# Patient Record
Sex: Male | Born: 1970
Health system: Southern US, Community
[De-identification: ages and names within clinical notes are randomized; demographics above are authoritative.]

## PROBLEM LIST (undated history)

## (undated) DIAGNOSIS — D499 Neoplasm of unspecified behavior of unspecified site: Secondary | ICD-10-CM

## (undated) DIAGNOSIS — S22069A Unspecified fracture of T7-T8 vertebra, initial encounter for closed fracture: Secondary | ICD-10-CM

## (undated) DIAGNOSIS — R000 Tachycardia, unspecified: Secondary | ICD-10-CM

## (undated) DIAGNOSIS — F419 Anxiety disorder, unspecified: Secondary | ICD-10-CM

## (undated) DIAGNOSIS — K219 Gastro-esophageal reflux disease without esophagitis: Secondary | ICD-10-CM

## (undated) DIAGNOSIS — I10 Essential (primary) hypertension: Secondary | ICD-10-CM

## (undated) HISTORY — PX: KNEE SURGERY: SHX244

## (undated) HISTORY — DX: Unspecified fracture of T7-t8 vertebra, initial encounter for closed fracture: S22.069A

## (undated) HISTORY — DX: Anxiety disorder, unspecified: F41.9

## (undated) HISTORY — DX: Neoplasm of unspecified behavior of unspecified site: D49.9

---

## 1999-11-21 ENCOUNTER — Emergency Department (HOSPITAL_COMMUNITY): Admission: EM | Admit: 1999-11-21 | Discharge: 1999-11-21 | Payer: Self-pay | Admitting: Emergency Medicine

## 2000-05-05 ENCOUNTER — Encounter: Payer: Self-pay | Admitting: Family Medicine

## 2000-05-05 ENCOUNTER — Ambulatory Visit (HOSPITAL_COMMUNITY): Admission: RE | Admit: 2000-05-05 | Discharge: 2000-05-05 | Payer: Self-pay | Admitting: Family Medicine

## 2001-06-29 DIAGNOSIS — K449 Diaphragmatic hernia without obstruction or gangrene: Secondary | ICD-10-CM

## 2001-06-29 HISTORY — DX: Diaphragmatic hernia without obstruction or gangrene: K44.9

## 2002-11-30 ENCOUNTER — Ambulatory Visit (HOSPITAL_COMMUNITY): Admission: RE | Admit: 2002-11-30 | Discharge: 2002-11-30 | Payer: Self-pay | Admitting: Gastroenterology

## 2002-11-30 ENCOUNTER — Encounter: Payer: Self-pay | Admitting: Gastroenterology

## 2002-12-26 ENCOUNTER — Ambulatory Visit (HOSPITAL_COMMUNITY): Admission: RE | Admit: 2002-12-26 | Discharge: 2002-12-26 | Payer: Self-pay | Admitting: Gastroenterology

## 2004-08-26 ENCOUNTER — Emergency Department (HOSPITAL_COMMUNITY): Admission: EM | Admit: 2004-08-26 | Discharge: 2004-08-26 | Payer: Self-pay | Admitting: Family Medicine

## 2004-08-28 ENCOUNTER — Ambulatory Visit: Payer: Self-pay | Admitting: Hematology & Oncology

## 2011-09-06 ENCOUNTER — Other Ambulatory Visit: Payer: Self-pay | Admitting: Family Medicine

## 2011-09-11 LAB — CULTURE, ROUTINE-ABSCESS
Gram Stain: NONE SEEN
Gram Stain: NONE SEEN
Gram Stain: NONE SEEN

## 2012-10-10 ENCOUNTER — Emergency Department (HOSPITAL_COMMUNITY)
Admission: EM | Admit: 2012-10-10 | Discharge: 2012-10-10 | Disposition: A | Discharge status: Home / Self Care | Payer: Self-pay | Attending: Emergency Medicine | Admitting: Emergency Medicine

## 2012-10-10 ENCOUNTER — Encounter (HOSPITAL_COMMUNITY): Payer: Self-pay | Admitting: *Deleted

## 2012-10-10 DIAGNOSIS — Z79899 Other long term (current) drug therapy: Secondary | ICD-10-CM | POA: Insufficient documentation

## 2012-10-10 DIAGNOSIS — T50905A Adverse effect of unspecified drugs, medicaments and biological substances, initial encounter: Secondary | ICD-10-CM

## 2012-10-10 DIAGNOSIS — R209 Unspecified disturbances of skin sensation: Secondary | ICD-10-CM | POA: Insufficient documentation

## 2012-10-10 DIAGNOSIS — R5381 Other malaise: Secondary | ICD-10-CM | POA: Insufficient documentation

## 2012-10-10 DIAGNOSIS — R42 Dizziness and giddiness: Secondary | ICD-10-CM | POA: Insufficient documentation

## 2012-10-10 DIAGNOSIS — R000 Tachycardia, unspecified: Secondary | ICD-10-CM | POA: Insufficient documentation

## 2012-10-10 DIAGNOSIS — F411 Generalized anxiety disorder: Secondary | ICD-10-CM | POA: Insufficient documentation

## 2012-10-10 DIAGNOSIS — E876 Hypokalemia: Secondary | ICD-10-CM | POA: Insufficient documentation

## 2012-10-10 DIAGNOSIS — R6883 Chills (without fever): Secondary | ICD-10-CM | POA: Insufficient documentation

## 2012-10-10 DIAGNOSIS — R0602 Shortness of breath: Secondary | ICD-10-CM | POA: Insufficient documentation

## 2012-10-10 DIAGNOSIS — T450X5A Adverse effect of antiallergic and antiemetic drugs, initial encounter: Secondary | ICD-10-CM | POA: Insufficient documentation

## 2012-10-10 HISTORY — DX: Tachycardia, unspecified: R00.0

## 2012-10-10 LAB — BASIC METABOLIC PANEL
BUN: 16 mg/dL (ref 6–23)
CO2: 26 mEq/L (ref 19–32)
Calcium: 10 mg/dL (ref 8.4–10.5)
Creatinine, Ser: 0.94 mg/dL (ref 0.50–1.35)
Glucose, Bld: 131 mg/dL — ABNORMAL HIGH (ref 70–99)

## 2012-10-10 LAB — CBC WITH DIFFERENTIAL/PLATELET
Basophils Absolute: 0 10*3/uL (ref 0.0–0.1)
Eosinophils Relative: 1 % (ref 0–5)
HCT: 40.6 % (ref 39.0–52.0)
Lymphocytes Relative: 12 % (ref 12–46)
MCV: 80.9 fL (ref 78.0–100.0)
Monocytes Absolute: 1 10*3/uL (ref 0.1–1.0)
Monocytes Relative: 6 % (ref 3–12)
RDW: 12.9 % (ref 11.5–15.5)
WBC: 16.4 10*3/uL — ABNORMAL HIGH (ref 4.0–10.5)

## 2012-10-10 LAB — MAGNESIUM: Magnesium: 1.9 mg/dL (ref 1.5–2.5)

## 2012-10-10 MED ORDER — POTASSIUM CHLORIDE CRYS ER 20 MEQ PO TBCR
40.0000 meq | EXTENDED_RELEASE_TABLET | Freq: Once | ORAL | Status: AC
  Administered 2012-10-10: 40 meq via ORAL
  Filled 2012-10-10: qty 2

## 2012-10-10 MED ORDER — POTASSIUM CHLORIDE ER 10 MEQ PO TBCR: 20.0000 meq | EXTENDED_RELEASE_TABLET | Freq: Every day | ORAL | Status: DC

## 2012-10-10 MED ORDER — POTASSIUM CHLORIDE 10 MEQ/100ML IV SOLN
10.0000 meq | INTRAVENOUS | Status: AC
  Administered 2012-10-10 (×2): 10 meq via INTRAVENOUS
  Filled 2012-10-10 (×2): qty 100

## 2012-10-10 NOTE — ED Notes (Signed)
Pt states that he has not taken his BP medication in a while

## 2012-10-10 NOTE — ED Provider Notes (Addendum)
History     CSN: [id number]  Arrival date & time 10/10/12  0405   First MD Initiated Contact with Patient 10/10/12 [phone]      Chief Complaint  Patient presents with  . Tachycardia  . Shortness of Breath  . Dizziness   HPI Donald Jenkins is a 42 y.o. male complaints of tachycardia. Patient says he has had a racing heart that started earlier this evening and has continued, he's had this before and was worked up for in September of 2007 hospital in [place]. He was discharged with a diagnosis of anxiety. He says he has no formal diagnosis of anxiety-he's not been treated for it, but he says "I worry a lot," and that he thinks he may have "social anxiety disorder." He says being with a few other people make him very nervous. Patient has seen her primary care provider [facility] but does not remember the physician's name.  He says this evening he developed rapid heartbeat, no palpitations, no chest pain, says sometimes he feels short of breath, denies sacral or perioral paresthesias.  He denies any history of venous thromboembolic disease, no hemoptysis, no history of cancer, no recent immobilization no swelling or pain in his legs. No recent long travel.  Patient is also concerned about a 3 cm x 5 cm patch of skin on the distal, lateral thigh and has some paresthesias, this is been present for over a year. He says he feels like it's gotten bigger. Is also concerned about 2 lumps that he feels under his skin in his thigh. He is concerned he "has a tumor pressing on an artery."  Patient takes an extensive amount of supplements, he's also been taking Bromaline which contains a brompheniramine and pseudoephedrine as well as other supplements such as grape seed extract, ginkgo biloba, capsicum, serrepeptase.    Past Medical History  Diagnosis Date  . Tachycardia     History reviewed. No pertinent past surgical history.  No family history on file.  History  Substance  Use Topics  . Smoking status: Never Smoker   . Smokeless tobacco: Not on file  . Alcohol Use: No      Review of Systems  Constitutional: Positive for chills and fatigue. Negative for fever and diaphoresis.  HENT: Negative for ear pain, congestion, sore throat, rhinorrhea and sneezing.   Eyes: Negative for photophobia, pain, redness and visual disturbance.  Respiratory: Positive for shortness of breath. Negative for cough, choking and wheezing.   Cardiovascular: Negative for chest pain, palpitations and leg swelling.  Gastrointestinal: Negative for nausea, vomiting, diarrhea, blood in stool and abdominal distention.  Endocrine: Negative for cold intolerance and heat intolerance.  Genitourinary: Negative for dysuria and flank pain.  Musculoskeletal: Negative for myalgias, joint swelling and arthralgias.  Skin: Negative for rash.  Allergic/Immunologic: Positive for environmental allergies.       Pollen  Neurological: Positive for light-headedness. Negative for dizziness, tremors, syncope, weakness and headaches.  Hematological: Negative for adenopathy. Does not bruise/bleed easily.  Psychiatric/Behavioral: Negative for sleep disturbance and self-injury. The patient is nervous/anxious. The patient is not hyperactive.     Allergies  Penicillins  Home Medications   Current Outpatient Rx  Name  Route  Sig  Dispense  Refill  . Brompheniramine-Pseudoeph (BROMALINE PO)   Oral   Take 1 tablet by mouth daily.         . Capsicum, Cayenne, (CAYENNE PO)   Oral   Take 1 tablet by mouth daily.         Marland Kitchen  Coenzyme Q10 (COQ-10 PO)   Oral   Take 1 tablet by mouth daily.         Marland Kitchen GINKGO BILOBA PO   Oral   Take 1 tablet by mouth daily.         Marland Kitchen GRAPE SEED EXTRACT PO   Oral   Take 1 tablet by mouth daily.         Marland Kitchen HYDROcodone-acetaminophen (NORCO/VICODIN) 5-325 MG per tablet   Oral   Take 1-1.5 tablets by mouth every 6 (six) hours as needed for pain.         .  L-ARGININE PO   Oral   Take 1 tablet by mouth daily.         . L-CYSTEINE PO   Oral   Take 1 tablet by mouth daily.         . magnesium oxide (MAG-OX) 400 MG tablet   Oral   Take 800 mg by mouth daily.         Marland Kitchen NIACIN PO   Oral   Take 1 tablet by mouth daily.         Marland Kitchen omega-3 acid ethyl esters (LOVAZA) 1 G capsule   Oral   Take 2 g by mouth daily.         Marland Kitchen omeprazole-sodium bicarbonate (ZEGERID) 40-1100 MG per capsule   Oral   Take 1 capsule by mouth daily as needed (for acid reflux).         Marland Kitchen OVER THE COUNTER MEDICATION   Oral   Take 1 tablet by mouth daily. Tumeric         . Triamcinolone Acetonide (NASACORT ALLERGY 24HR NA)   Nasal   Place 1 spray into the nose 2 (two) times daily as needed (for allergies).         . Vitamin D-Vitamin K (VITAMIN K2-VITAMIN D3 PO)   Oral   Take 5,000 mg by mouth daily.           BP 178/103  Pulse 129  Temp(Src) 98.9 F (37.2 C) (Oral)  Resp 22  SpO2 100%  Physical Exam  Nursing notes reviewed.  Electronic medical record reviewed. VITAL SIGNS:   Filed Vitals:   10/10/12 0420 10/10/12 0746  BP: 178/103 145/75  Pulse: 129 90  Temp: 98.9 F (37.2 C)   TempSrc: Oral   Resp: 22 18  SpO2: 100% 100%   CONSTITUTIONAL: Awake, oriented, appears non-toxic HENT: Atraumatic, normocephalic, oral mucosa pink and moist, airway patent. Nares patent without drainage. External ears normal. EYES: Conjunctiva clear, EOMI, PERRLA NECK: Trachea midline, non-tender, supple CARDIOVASCULAR: Normal heart rate, Normal rhythm, No murmurs, rubs, gallops PULMONARY/CHEST: Clear to auscultation, no rhonchi, wheezes, or rales. Symmetrical breath sounds. Non-tender. ABDOMINAL: Non-distended, soft, non-tender - no rebound or guarding.  BS normal. NEUROLOGIC: Non-focal, moving all four extremities, small area of paresthesia to the distal lateral thigh, no other gross sensory or motor deficits. EXTREMITIES: No clubbing, cyanosis,  or edema. Small pea-sized nodular subcutaneous masses felt in the distal thigh. Small area of paresthesias of the distal lateral thigh. SKIN: Warm, Dry, No erythema, No rash   ED Course  Procedures (including critical care time)  Date: 10/10/2012  Rate: 124  Rhythm:  Sinus tachycardia  QRS Axis: normal  Intervals: normal  ST/T Wave abnormalities: normal  Conduction Disutrbances: none  Narrative Interpretation: Sinus tachycardia no prior for comparison     Labs Reviewed  CBC WITH DIFFERENTIAL - Abnormal; Notable for the following:  WBC 16.4 (*)    MCHC 36.7 (*)    Neutrophils Relative 81 (*)    Neutro Abs 13.3 (*)    All other components within normal limits  BASIC METABOLIC PANEL - Abnormal; Notable for the following:    Potassium 2.6 (*)    Glucose, Bld 131 (*)    All other components within normal limits  MAGNESIUM  D-DIMER, QUANTITATIVE   No results found.   1. Tachycardia   2. Adverse reaction to over-the-counter medication, initial encounter   3. Hypokalemia     MDM  PT on multiple suplemetns presents with tachycardia, suspect pseudoephedrine.  EKG shows sinus tachycardia.  D-dimer is negative, pretest probability is extremely low for venous thrombolic disease, I do not think he has a PE or blood clot in his legs.  Bedside ultrasound of nodules in the patient's thigh appear to be small lipomas - he can have these removed as needed.  Laboratory analysis does reveal hypokalemia with a potassium of 2.6. This could be accounting for the patient's fatigue. I think probably one of the supplements is likely causing this, I told him to not take any of the supplements, will replete potassium by mouth and IV.  Patient's renal function is normal, we'll send him home with some potassium supplements and followup with primary care.  I explained the diagnosis and have given explicit precautions to return to the ER including any other new or worsening symptoms. The patient  understands and accepts the medical plan as it's been dictated and I have answered their questions. Discharge instructions concerning home care and prescriptions have been given.  The patient is STABLE and is discharged to home in good condition.      Jones Skene, MD 10/10/12 1610  Jones Skene, MD 10/10/12 1025

## 2012-10-10 NOTE — ED Notes (Signed)
Pt denies CP. C/o fast HR, lightheadedness. Denies N/V. C/o numbness in right leg.

## 2012-10-10 NOTE — ED Notes (Addendum)
C/o fast HR, also some sob and dizziness. Skin cool pale dry. Alert, NAD, calm, interactive, ambulatory, speaking in clear complete sentences. Denies CP. H/o similar, "thought it might be r/t anxiety". Takes no meds, "takes lots of supplements & vitamins".

## 2013-07-02 ENCOUNTER — Emergency Department (HOSPITAL_COMMUNITY)
Admission: EM | Admit: 2013-07-02 | Discharge: 2013-07-02 | Disposition: A | Discharge status: Home / Self Care | Payer: No Typology Code available for payment source | Attending: Emergency Medicine | Admitting: Emergency Medicine

## 2013-07-02 ENCOUNTER — Encounter (HOSPITAL_COMMUNITY): Payer: Self-pay | Admitting: Emergency Medicine

## 2013-07-02 ENCOUNTER — Emergency Department (HOSPITAL_COMMUNITY): Payer: No Typology Code available for payment source

## 2013-07-02 ENCOUNTER — Emergency Department (INDEPENDENT_AMBULATORY_CARE_PROVIDER_SITE_OTHER)
Admission: EM | Admit: 2013-07-02 | Discharge: 2013-07-02 | Disposition: A | Discharge status: Hospice | Payer: No Typology Code available for payment source | Source: Home / Self Care | Attending: Family Medicine | Admitting: Family Medicine

## 2013-07-02 DIAGNOSIS — E876 Hypokalemia: Secondary | ICD-10-CM | POA: Insufficient documentation

## 2013-07-02 DIAGNOSIS — F419 Anxiety disorder, unspecified: Secondary | ICD-10-CM

## 2013-07-02 DIAGNOSIS — F411 Generalized anxiety disorder: Secondary | ICD-10-CM | POA: Insufficient documentation

## 2013-07-02 DIAGNOSIS — D72829 Elevated white blood cell count, unspecified: Secondary | ICD-10-CM | POA: Insufficient documentation

## 2013-07-02 DIAGNOSIS — I10 Essential (primary) hypertension: Secondary | ICD-10-CM | POA: Insufficient documentation

## 2013-07-02 DIAGNOSIS — R000 Tachycardia, unspecified: Secondary | ICD-10-CM | POA: Insufficient documentation

## 2013-07-02 DIAGNOSIS — R51 Headache: Secondary | ICD-10-CM | POA: Insufficient documentation

## 2013-07-02 DIAGNOSIS — I498 Other specified cardiac arrhythmias: Secondary | ICD-10-CM

## 2013-07-02 DIAGNOSIS — Z880 Allergy status to penicillin: Secondary | ICD-10-CM | POA: Insufficient documentation

## 2013-07-02 DIAGNOSIS — Z79899 Other long term (current) drug therapy: Secondary | ICD-10-CM | POA: Insufficient documentation

## 2013-07-02 LAB — POCT I-STAT, CHEM 8
BUN: 13 mg/dL (ref 6–23)
Calcium, Ion: 1.2 mmol/L (ref 1.12–1.23)
Chloride: 99 mEq/L (ref 96–112)
Creatinine, Ser: 1 mg/dL (ref 0.50–1.35)
Glucose, Bld: 146 mg/dL — ABNORMAL HIGH (ref 70–99)
HEMATOCRIT: 49 % (ref 39.0–52.0)
HEMOGLOBIN: 16.7 g/dL (ref 13.0–17.0)
POTASSIUM: 2.6 meq/L — AB (ref 3.7–5.3)
SODIUM: 141 meq/L (ref 137–147)
TCO2: 24 mmol/L (ref 0–100)

## 2013-07-02 LAB — CBC
HCT: 42.3 % (ref 39.0–52.0)
Hemoglobin: 15.5 g/dL (ref 13.0–17.0)
MCH: 29.9 pg (ref 26.0–34.0)
MCHC: 36.6 g/dL — ABNORMAL HIGH (ref 30.0–36.0)
MCV: 81.5 fL (ref 78.0–100.0)
PLATELETS: 241 10*3/uL (ref 150–400)
RBC: 5.19 MIL/uL (ref 4.22–5.81)
RDW: 12.3 % (ref 11.5–15.5)
WBC: 13.2 10*3/uL — AB (ref 4.0–10.5)

## 2013-07-02 LAB — BASIC METABOLIC PANEL
BUN: 14 mg/dL (ref 6–23)
CHLORIDE: 98 meq/L (ref 96–112)
CO2: 27 mEq/L (ref 19–32)
CREATININE: 0.87 mg/dL (ref 0.50–1.35)
Calcium: 8.6 mg/dL (ref 8.4–10.5)
GFR calc non Af Amer: >90 mL/min (ref >90)
Glucose, Bld: 109 mg/dL — ABNORMAL HIGH (ref 70–99)
POTASSIUM: 3.1 meq/L — AB (ref 3.7–5.3)
SODIUM: 138 meq/L (ref 137–147)

## 2013-07-02 LAB — POCT I-STAT TROPONIN I: TROPONIN I, POC: 0 ng/mL (ref 0.00–0.08)

## 2013-07-02 MED ORDER — HYDROCHLOROTHIAZIDE 25 MG PO TABS: 25.0000 mg | ORAL_TABLET | Freq: Every day | ORAL | Status: DC

## 2013-07-02 MED ORDER — POTASSIUM CHLORIDE ER 10 MEQ PO TBCR: 10.0000 meq | EXTENDED_RELEASE_TABLET | Freq: Every day | ORAL | Status: DC

## 2013-07-02 MED ORDER — POTASSIUM CHLORIDE 10 MEQ/100ML IV SOLN
10.0000 meq | INTRAVENOUS | Status: DC
  Administered 2013-07-02 (×3): 10 meq via INTRAVENOUS
  Filled 2013-07-02 (×3): qty 100

## 2013-07-02 MED ORDER — POTASSIUM CHLORIDE 20 MEQ/15ML (10%) PO LIQD
40.0000 meq | Freq: Once | ORAL | Status: AC
  Administered 2013-07-02: 40 meq via ORAL
  Filled 2013-07-02: qty 30

## 2013-07-02 MED ORDER — SODIUM CHLORIDE 0.9 % IV BOLUS (SEPSIS)
1000.0000 mL | Freq: Once | INTRAVENOUS | Status: AC
  Administered 2013-07-02: 1000 mL via INTRAVENOUS

## 2013-07-02 MED ORDER — SODIUM CHLORIDE 0.9 % IV SOLN
Freq: Once | INTRAVENOUS | Status: AC
  Administered 2013-07-02: 18:00:00 via INTRAVENOUS

## 2013-07-02 MED ORDER — LORAZEPAM 2 MG/ML IJ SOLN
1.0000 mg | Freq: Once | INTRAMUSCULAR | Status: AC
  Administered 2013-07-02: 1 mg via INTRAVENOUS
  Filled 2013-07-02: qty 1

## 2013-07-02 NOTE — ED Notes (Signed)
Offered comfort measures.

## 2013-07-02 NOTE — ED Notes (Signed)
Patient returned from xray and placed on cardiac monitor.   

## 2013-07-02 NOTE — ED Provider Notes (Signed)
TIME SEEN: 7:40 PM  CHIEF COMPLAINT: Palpitations  HPI: Patient is a 43 y.o. male with history of anxiety and prior palpitations in the setting of hypokalemia who presents the emergency department as a transfer from urgent care for palpitations that started at 4 PM today while at rest and watching football game. He reports that he began feeling palpitations which may have a very anxious which made his symptoms worse. He states that he did have a mild headache this morning and took headache medication that was Tylenol with caffeine. He reports that he has had similar episodes of palpitations in April of 2014 in and 2007. Both times he was hypokalemic. He denies that he's had any vomiting or diarrhea. No bloody stools or melena. He denies any chest pain or discomfort. He did have mild shortness of breath but this is resolved. Patient does take multiple supplements but is no longer taking Bromaline which did contain pseudoephedrine. He states that he normally does not drink any caffeine at all because of his history of palpitations. He denies any illicit drug use. No history of prolonged immobilization, fracture, surgery, trauma, hospitalization, prior PE or DVT, lower extremity swelling or pain, tobacco use. At urgent care today, patient was noted to be in sinus tachycardia and was hypertensive and had a potassium of 2.6.  ROS: See HPI Constitutional: no fever  Eyes: no drainage  ENT: no runny nose   Cardiovascular:  no chest pain  Resp: Resolved SOB  GI: no vomiting GU: no dysuria Integumentary: no rash  Allergy: no hives  Musculoskeletal: no leg swelling  Neurological: no slurred speech ROS otherwise negative  PAST MEDICAL HISTORY/PAST SURGICAL HISTORY:  Past Medical History  Diagnosis Date  . Tachycardia     MEDICATIONS:  Prior to Admission medications   Medication Sig Start Date End Date Taking? Authorizing Provider  Capsicum, Cayenne, (CAYENNE PO) Take 1 tablet by mouth daily.   Yes  Historical Provider, MD  Coenzyme Q10 (COQ-10 PO) Take 1 tablet by mouth daily.   Yes Historical Provider, MD  GINKGO BILOBA PO Take 1 tablet by mouth daily.   Yes Historical Provider, MD  GRAPE SEED EXTRACT PO Take 1 tablet by mouth daily.   Yes Historical Provider, MD  L-ARGININE PO Take 1 tablet by mouth daily.   Yes Historical Provider, MD  L-CYSTEINE PO Take 1 tablet by mouth daily.   Yes Historical Provider, MD  magnesium oxide (MAG-OX) 400 MG tablet Take 800 mg by mouth daily.   Yes Historical Provider, MD  NIACIN PO Take 1 tablet by mouth daily.   Yes Historical Provider, MD  omega-3 acid ethyl esters (LOVAZA) 1 G capsule Take 2 g by mouth daily.   Yes Historical Provider, MD  omeprazole-sodium bicarbonate (ZEGERID) 40-1100 MG per capsule Take 1 capsule by mouth daily as needed (for acid reflux).   Yes Historical Provider, MD  OVER THE COUNTER MEDICATION Take 1 tablet by mouth daily. Tumeric   Yes Historical Provider, MD  Triamcinolone Acetonide (NASACORT ALLERGY 24HR NA) Place 1 spray into the nose 2 (two) times daily as needed (for allergies).   Yes Historical Provider, MD  Vitamin D-Vitamin K (VITAMIN K2-VITAMIN D3 PO) Take 5,000 mg by mouth daily.   Yes Historical Provider, MD    ALLERGIES:  Allergies  Allergen Reactions  . Penicillins Other (See Comments)    From childhood    SOCIAL HISTORY:  History  Substance Use Topics  . Smoking status: Never Smoker   .  Smokeless tobacco: Not on file  . Alcohol Use: No    FAMILY HISTORY: No family history on file.  EXAM: BP 194/121  Pulse 112  Temp(Src) 98.3 F (36.8 C) (Oral)  Resp 13  SpO2 99% CONSTITUTIONAL: Alert and oriented and responds appropriately to questions. Well-appearing; well-nourished, appears anxious HEAD: Normocephalic EYES: Conjunctivae clear, PERRL ENT: normal nose; no rhinorrhea; moist mucous membranes; pharynx without lesions noted NECK: Supple, no meningismus, no LAD  CARD: Tachycardic; S1 and S2  appreciated; no murmurs, no clicks, no rubs, no gallops RESP: Normal chest excursion without splinting or tachypnea; breath sounds clear and equal bilaterally; no wheezes, no rhonchi, no rales,  ABD/GI: Normal bowel sounds; non-distended; soft, non-tender, no rebound, no guarding BACK:  The back appears normal and is non-tender to palpation, there is no CVA tenderness EXT: Normal ROM in all joints; non-tender to palpation; no edema; normal capillary refill; no cyanosis    SKIN: Normal color for age and race; warm NEURO: Moves all extremities equally PSYCH: The patient's mood and manner are appropriate. Grooming and personal hygiene are appropriate.  MEDICAL DECISION MAKING: Patient here with hypertension and tachycardia. He is currently feeling anxious but otherwise asymptomatic. He has no risk factors for PE. No risk factors for ACS. Denies any current chest pain or shortness of breath. EKG shows sinus tachycardia with no ischemic changes. Patient was hypokalemic at urgent care today. We'll repeat labs and begin replacing potassium. We'll give IV fluids and IV Ativan and reassess.  ED PROGRESS: Patient has a mild leukocytosis which I feel is reactive. His repeat potassium is 3.1. Chest x-ray shows no infiltrate, edema. Troponin is negative. His heart rate is now in the 90s after IV fluids and Ativan. He is still mildly hypertensive but asymptomatic. Have urged the patient to establish primary care followup and we'll start him on hydrochlorothiazide 25 mg daily for his elevated blood pressure. Have discussed with patient that I do not feel this needs to be acutely lower today given he is likely been hypertensive for several years since stopping his prior antihypertensives. Have given strict return precautions. Will send home with several days of potassium for replacement. Patient verbalizes understanding and is comfortable with plan.   EKG Interpretation    Date/Time:  Sunday July 02 2013 19:08:50  EST Ventricular Rate:  109 PR Interval:  183 QRS Duration: 87 QT Interval:  356 QTC Calculation: 479 R Axis:   54 Text Interpretation:  Sinus tachycardia No significant change since last tracing Confirmed by Balinda Heacock  DO, Aboubacar Matsuo ([id number]) on 07/02/2013 7:14:11 PM               [place], DO 07/02/13 2255

## 2013-07-02 NOTE — ED Notes (Signed)
Patient with some anxiety of coming to ED from [facility].  Patient states that some of his tachycardia can be from his anxiety.

## 2013-07-02 NOTE — Discharge Instructions (Signed)
Nonspecific Tachycardia Tachycardia is a faster than normal heartbeat (more than 100 beats per minute). In adults, the heart normally beats between 60 and 100 times a minute. A fast heartbeat may be a normal response to exercise or stress. It does not necessarily mean that something is wrong. However, sometimes when your heart beats too fast it may not be able to pump enough blood to the rest of your body. This can result in chest pain, shortness of breath, dizziness, and even fainting. Nonspecific tachycardia means that the specific cause or pattern of your tachycardia is unknown. CAUSES  Tachycardia may be harmless or it may be due to a more serious underlying cause. Possible causes of tachycardia include:  Exercise or exertion.  Fever.  Pain or injury.  Infection.  Loss of body fluids (dehydration).  Overactive thyroid.  Lack of red blood cells (anemia).  Anxiety and stress.  Alcohol.  Caffeine.  Tobacco products.  Diet pills.  Illegal drugs.  Heart disease. SYMPTOMS  Rapid or irregular heartbeat (palpitations).  Suddenly feeling your heart beating (cardiac awareness).  Dizziness.  Tiredness (fatigue).  Shortness of breath.  Chest pain.  Nausea.  Fainting. DIAGNOSIS  Your caregiver will perform a physical exam and take your medical history. In some cases, a heart specialist (cardiologist) may be consulted. Your caregiver may also order:  Blood tests.  Electrocardiography. This test records the electrical activity of your heart.  A heart monitoring test. TREATMENT  Treatment will depend on the likely cause of your tachycardia. The goal is to treat the underlying cause of your tachycardia. Treatment methods may include:  Replacement of fluids or blood through an intravenous (IV) tube for moderate to severe dehydration or anemia.  New medicines or changes in your current medicines.  Diet and lifestyle changes.  Treatment for certain  infections.  Stress relief or relaxation methods. HOME CARE INSTRUCTIONS   Rest.  Drink enough fluids to keep your urine clear or pale yellow.  Do not smoke.  Avoid:  Caffeine.  Tobacco.  Alcohol.  Chocolate.  Stimulants such as over-the-counter diet pills or pills that help you stay awake.  Situations that cause anxiety or stress.  Illegal drugs such as marijuana, phencyclidine (PCP), and cocaine.  Only take medicine as directed by your caregiver.  Keep all follow-up appointments as directed by your caregiver. SEEK IMMEDIATE MEDICAL CARE IF:   You have pain in your chest, upper arms, jaw, or neck.  You become weak, dizzy, or feel faint.  You have palpitations that will not go away.  You vomit, have diarrhea, or pass blood in your stool.  Your skin is cool, pale, and wet.  You have a fever that will not go away with rest, fluids, and medicine. MAKE SURE YOU:   Understand these instructions.  Will watch your condition.  Will get help right away if you are not doing well or get worse. Document Released: 07/23/2004 Document Revised: 09/07/2011 Document Reviewed: 05/26/2011 [facility] Patient Information 2014 [place], [place].  Hypertension As your heart beats, it forces blood through your arteries. This force is your blood pressure. If the pressure is too high, it is called hypertension (HTN) or high blood pressure. HTN is dangerous because you may have it and not know it. High blood pressure may mean that your heart has to work harder to pump blood. Your arteries may be narrow or stiff. The extra work puts you at risk for heart disease, stroke, and other problems.  Blood pressure consists of two  numbers, a higher number over a lower, 110/72, for example. It is stated as "110 over 72." The ideal is below 120 for the top number (systolic) and under 80 for the bottom (diastolic). Write down your blood pressure today. You should pay close attention to your blood  pressure if you have certain conditions such as:  Heart failure.  Prior heart attack.  Diabetes  Chronic kidney disease.  Prior stroke.  Multiple risk factors for heart disease. To see if you have HTN, your blood pressure should be measured while you are seated with your arm held at the level of the heart. It should be measured at least twice. A one-time elevated blood pressure reading (especially in the Emergency Department) does not mean that you need treatment. There may be conditions in which the blood pressure is different between your right and left arms. It is important to see your caregiver soon for a recheck. Most people have essential hypertension which means that there is not a specific cause. This type of high blood pressure may be lowered by changing lifestyle factors such as:  Stress.  Smoking.  Lack of exercise.  Excessive weight.  Drug/tobacco/alcohol use.  Eating less salt. Most people do not have symptoms from high blood pressure until it has caused damage to the body. Effective treatment can often prevent, delay or reduce that damage. TREATMENT  When a cause has been identified, treatment for high blood pressure is directed at the cause. There are a large number of medications to treat HTN. These fall into several categories, and your caregiver will help you select the medicines that are best for you. Medications may have side effects. You should review side effects with your caregiver. If your blood pressure stays high after you have made lifestyle changes or started on medicines,   Your medication(s) may need to be changed.  Other problems may need to be addressed.  Be certain you understand your prescriptions, and know how and when to take your medicine.  Be sure to follow up with your caregiver within the time frame advised (usually within two weeks) to have your blood pressure rechecked and to review your medications.  If you are taking more than one  medicine to lower your blood pressure, make sure you know how and at what times they should be taken. Taking two medicines at the same time can result in blood pressure that is too low. SEEK IMMEDIATE MEDICAL CARE IF:  You develop a severe headache, blurred or changing vision, or confusion.  You have unusual weakness or numbness, or a faint feeling.  You have severe chest or abdominal pain, vomiting, or breathing problems. MAKE SURE YOU:   Understand these instructions.  Will watch your condition.  Will get help right away if you are not doing well or get worse. Document Released: 06/15/2005 Document Revised: 09/07/2011 Document Reviewed: 02/03/2008 [facility] Patient Information 2014 [place].  Hypokalemia Hypokalemia means a low potassium level in the blood.Potassium is an electrolyte that helps regulate the amount of fluid in the body. It also stimulates muscle contraction and maintains a stable acid-base balance.Most of the body's potassium is inside of cells, and only a very small amount is in the blood. Because the amount in the blood is so small, minor changes can have big effects. PREPARATION FOR TEST Testing for potassium requires taking a blood sample taken by needle from a vein in the arm. The skin is cleaned thoroughly before the sample is drawn. There is no  other special preparation needed. NORMAL VALUES Potassium levels below 3.5 mEq/L are abnormally low. Levels above 5.1 mEq/L are abnormally high. Ranges for normal findings may vary among different laboratories and hospitals. You should always check with your doctor after having lab work or other tests done to discuss the meaning of your test results and whether your values are considered within normal limits. MEANING OF TEST  Your caregiver will go over the test results with you and discuss the importance and meaning of your results, as well as treatment options and the need for additional tests, if necessary. A  potassium level is frequently part of a routine medical exam. It is usually included as part of a whole "panel" of tests for several blood salts (such as Sodium and Chloride). It may be done as part of follow-up when a low potassium level was found in the past or other blood salts are suspected of being out of balance. A low potassium level might be suspected if you have one or more of the following:  Symptoms of weakness.  Abnormal heart rhythms.  High blood pressure and are taking medication to control this, especially water pills (diuretics).  Kidney disease that can affect your potassium level .  Diabetes requiring the use of insulin. The potassium may fall after taking insulin, especially if the diabetes had been out of control for a while.  A condition requiring the use of cortisone-type medication or certain types of antibiotics.  Vomiting and/or diarrhea for more than a day or two.  A stomach or intestinal condition that may not permit appropriate absorption of potassium.  Fainting episodes.  Mental confusion. OBTAINING TEST RESULTS It is your responsibility to obtain your test results. Ask the lab or department performing the test when and how you will get your results.  Please contact your caregiver directly if you have not received the results within one week. At that time, ask if there is anything different or new you should be doing in relation to the results. TREATMENT Hypokalemia can be treated with potassium supplements taken by mouth and/or adjustments in your current medications. A diet high in potassium is also helpful. Foods with high potassium content are:  Peas, lentils, lima beans, nuts, and dried fruit.  Whole grain and bran cereals and breads.  Fresh fruit, vegetables (bananas, cantaloupe, grapefruit, oranges, tomatoes, honeydew melons, potatoes).  Orange and tomato juices.  Meats. If potassium supplement has been prescribed for you today or your  medications have been adjusted, see your personal caregiver in time02 for a re-check. SEEK MEDICAL CARE IF:  There is a feeling of worsening weakness.  You experience repeated chest palpitations.  You are diabetic and having difficulty keeping your blood sugars in the normal range.  You are experiencing vomiting and/or diarrhea.  You are having difficulty with any of your regular medications. SEEK IMMEDIATE MEDICAL CARE IF:  You experience chest pain, shortness of breath, or episodes of dizziness.  You have been having vomiting or diarrhea for more than 2 days.  You have a fainting episode. MAKE SURE YOU:   Understand these instructions.  Will watch your condition.  Will get help right away if you are not doing well or get worse. Document Released: 06/15/2005 Document Revised: 09/07/2011 Document Reviewed: 12/16/2012 [facility] Patient Information 2014 [place].   RESOURCE GUIDE  Chronic Pain Problems: Contact [facility]  [phone] Patients need to be referred by their primary care doctor.  Insufficient Money for Medicine: Contact  [employer]:  call [phone]  No Primary Care Doctor: - [place] - can help you locate a primary care doctor that  accepts your insurance, provides certain services, etc. - Physician Referral Service- [phone]  Agencies that provide inexpensive medical care: - [facility]  [facility]  [phone] - [facility]  [phone] - [facility]  [phone] - [facility]  [facility]  [phone]  [place] Providers: - [facility]- [address]  [phone], Mon-Fri 9am-7pm, Sat 9am-1pm - [facility]- [place], [address] [place]  [place], [address] [place]  [place]- [address]  [place], [address], [phone]  Only accepts [place] [facility] [place] patients after they have their name  applied to their card  Self Pay (no insurance) in [place]: - Sickle Cell Patients - [facility]  [place], [facility]- [place] [facility] [place]- [id number] [place], [facility]- see information above (Speak to [employer] if you do not have insurance)       -  [facility]- [place],  [place], [place]  Dr Vista Lawman-  [address], [place], [place], [place]       -  [facility] [place] [address] [phone]       -  [facility]- [address], [place], also [address], [phone]       -     [facility]- [address], [place], 1st & 3rd Saturday         every month, 10am-1pm  -     [place]   [address] [place], [place].   Phone:  [phone], Fax:  [phone]. Hours of Operation:  9 am - 6 pm, M-F.  -     [facility]   [address], [place]   Phone: [phone], Fax: [phone]. Hours of Operation:  8:30 am - 5:30 pm, M-F.    Dental Assistance If unable to pay or uninsured, contact:  [facility]. to become qualified for the adult dental clinic.  Patients with Medicaid: [facility] [phone] [address], [place] [address] [phone]  If unable to pay, or uninsured, contact [facility] [phone] in [place], [place] in [facility]) to become qualified for the adult dental clinic  [facility] [address] [place], [place] [address] [phone] [email or web address]  Other [place]: - [facility] [place], [place], [place], [address], [place], 2nd and 4th Thursday of the month at 6:30am.  10 clients each day by appointment, can sometimes see walk-in patients if someone does not show for an appointment. [facility]- [address] [place], [place], [address], [place] -  [place], [place], [place], [address], [place] Department- [place] Department- [place] Department- [phone]

## 2013-07-02 NOTE — ED Notes (Signed)
C/o rapid heart beat which begin 2 hours ago States he has had this episode once before States he did have headache earlier and took medication for it which was Fioricet an motrin this evening.  No signs of distress

## 2013-07-02 NOTE — ED Notes (Signed)
Patient taken to xray.

## 2013-07-02 NOTE — ED Provider Notes (Signed)
TIME SEEN: 7:31 PM  CHIEF COMPLAINT: Tachycardia, hypertension  HPI: Patient is a 43 year old male with a history of prior hypertension has been off meds for several years who presents to the emergency department from urgent care with tachycardia and hypertension. Patient reports that he was watching a football game today when he felt his heart racing. He states that it made him very anxious which made his symptoms worse. He states he did have some intermittent shortness of breath but this is now gone. No chest pain. He's had similar episodes twice in the past, once in 2007 and once in April 2014. Patient reports because of these palpitations he has cut caffeine and other stimulants completely out of his diet. Patient reports that he was previously on multiple supplements including Bromaline which contains pseudoephedrine but states he does not take this anymore. Denies any illicit drug use. He states today he did have a mild headache and took an over-the-counter tension headache medicine with caffeine in it. Denies any vomiting or diarrhea. No bloody stools or melena. No fever. No history of PE or DVT, recent prolonged immobilization such as long flight or hospitalization, fracture, surgery, trauma, tobacco use, lower extremity swelling or pain.  ROS: See HPI Constitutional: no fever  Eyes: no drainage  ENT: no runny nose   Cardiovascular:  no chest pain  Resp: no SOB  GI: no vomiting GU: no dysuria Integumentary: no rash  Allergy: no hives  Musculoskeletal: no leg swelling  Neurological: no slurred speech ROS otherwise negative  PAST MEDICAL HISTORY/PAST SURGICAL HISTORY:  Past Medical History  Diagnosis Date  . Tachycardia     MEDICATIONS:  Prior to Admission medications   Medication Sig Start Date End Date Taking? Authorizing Provider  Capsicum, Cayenne, (CAYENNE PO) Take 1 tablet by mouth daily.   Yes Historical Provider, MD  Coenzyme Q10 (COQ-10 PO) Take 1 tablet by mouth daily.    Yes Historical Provider, MD  GINKGO BILOBA PO Take 1 tablet by mouth daily.   Yes Historical Provider, MD  GRAPE SEED EXTRACT PO Take 1 tablet by mouth daily.   Yes Historical Provider, MD  L-ARGININE PO Take 1 tablet by mouth daily.   Yes Historical Provider, MD  L-CYSTEINE PO Take 1 tablet by mouth daily.   Yes Historical Provider, MD  magnesium oxide (MAG-OX) 400 MG tablet Take 800 mg by mouth daily.   Yes Historical Provider, MD  NIACIN PO Take 1 tablet by mouth daily.   Yes Historical Provider, MD  omega-3 acid ethyl esters (LOVAZA) 1 G capsule Take 2 g by mouth daily.   Yes Historical Provider, MD  omeprazole-sodium bicarbonate (ZEGERID) 40-1100 MG per capsule Take 1 capsule by mouth daily as needed (for acid reflux).   Yes Historical Provider, MD  OVER THE COUNTER MEDICATION Take 1 tablet by mouth daily. Tumeric   Yes Historical Provider, MD  Triamcinolone Acetonide (NASACORT ALLERGY 24HR NA) Place 1 spray into the nose 2 (two) times daily as needed (for allergies).   Yes Historical Provider, MD  Vitamin D-Vitamin K (VITAMIN K2-VITAMIN D3 PO) Take 5,000 mg by mouth daily.   Yes Historical Provider, MD    ALLERGIES:  Allergies  Allergen Reactions  . Penicillins Other (See Comments)    From childhood    SOCIAL HISTORY:  History  Substance Use Topics  . Smoking status: Never Smoker   . Smokeless tobacco: Not on file  . Alcohol Use: No    FAMILY HISTORY: No family history on  file.  EXAM: BP 194/121  Pulse 112  Temp(Src) 98.3 F (36.8 C) (Oral)  Resp 13  SpO2 99% CONSTITUTIONAL: Alert and oriented and responds appropriately to questions. Well-appearing; well-nourished HEAD: Normocephalic EYES: Conjunctivae clear, PERRL ENT: normal nose; no rhinorrhea; moist mucous membranes; pharynx without lesions noted NECK: Supple, no meningismus, no LAD  CARD: Tachycardic; S1 and S2 appreciated; no murmurs, no clicks, no rubs, no gallops RESP: Normal chest excursion without  splinting or tachypnea; breath sounds clear and equal bilaterally; no wheezes, no rhonchi, no rales,  ABD/GI: Normal bowel sounds; non-distended; soft, non-tender, no rebound, no guarding BACK:  The back appears normal and is non-tender to palpation, there is no CVA tenderness EXT: Normal ROM in all joints; non-tender to palpation; no edema; normal capillary refill; no cyanosis    SKIN: Normal color for age and race; warm NEURO: Moves all extremities equally, cranial nerves II through XII intact, sensation to light touch intact diffusely PSYCH: The patient's mood and manner are appropriate. Grooming and personal hygiene are appropriate.  MEDICAL DECISION MAKING: Patient here with sinus tachycardia and hypertension. He is asymptomatic currently. At urgent care, patient was noted to be hypokalemic. Will recheck labs, chest x-ray. EKG shows no ischemic changes. He has no risk factors for PE or ACS. Suspect some of his symptoms may be related to anxiety. Will give IV fluids. Will also give Ativan.  ED PROGRESS: Patient reports feeling much better after fluids and Ativan. His heart rate is now in the 90s. Patient is still mildly hypertensive but is asymptomatic. Had lengthy discussion with patient that I do not feel he needs to have his blood pressure acutely lower to the emergency department as his blood pressure is likely been elevated for several years. We'll start him on low-dose hydrochlorothiazide and give him outpatient followup information. Given strict return precautions. Have advised patient to stay away from caffeine or other stimulants. Patient verbalizes understanding is comfortable plan.     [place], DO 07/03/13 0045

## 2013-07-02 NOTE — ED Provider Notes (Signed)
Donald Jenkins is a 43 y.o. male who presents to Urgent Care today for palpitations starting 2 hours ago. Patient notes rapid heart rate starting about 2 hours ago without any clear explanation. He denies any chest pain but does have some intermittent shortness of breath. He has similar episode a few years ago that was not fully worked up. He has a history of significant hypokalemia in the past. He denies any dizziness lightheadedness or weakness.  He did take Fioricet this morning because of a headache which he no longer has. He feels well otherwise.    Past Medical History  Diagnosis Date  . Tachycardia    History  Substance Use Topics  . Smoking status: Never Smoker   . Smokeless tobacco: Not on file  . Alcohol Use: No   ROS as above Medications reviewed. No current facility-administered medications for this encounter.   Current Outpatient Prescriptions  Medication Sig Dispense Refill  . Brompheniramine-Pseudoeph (BROMALINE PO) Take 1 tablet by mouth daily.      . Capsicum, Cayenne, (CAYENNE PO) Take 1 tablet by mouth daily.      . Coenzyme Q10 (COQ-10 PO) Take 1 tablet by mouth daily.      Marland Kitchen GINKGO BILOBA PO Take 1 tablet by mouth daily.      Marland Kitchen GRAPE SEED EXTRACT PO Take 1 tablet by mouth daily.      Marland Kitchen HYDROcodone-acetaminophen (NORCO/VICODIN) 5-325 MG per tablet Take 1-1.5 tablets by mouth every 6 (six) hours as needed for pain.      . L-ARGININE PO Take 1 tablet by mouth daily.      . L-CYSTEINE PO Take 1 tablet by mouth daily.      . magnesium oxide (MAG-OX) 400 MG tablet Take 800 mg by mouth daily.      Marland Kitchen NIACIN PO Take 1 tablet by mouth daily.      Marland Kitchen omega-3 acid ethyl esters (LOVAZA) 1 G capsule Take 2 g by mouth daily.      Marland Kitchen omeprazole-sodium bicarbonate (ZEGERID) 40-1100 MG per capsule Take 1 capsule by mouth daily as needed (for acid reflux).      Marland Kitchen OVER THE COUNTER MEDICATION Take 1 tablet by mouth daily. Tumeric      . potassium chloride (K-DUR) 10 MEQ tablet  Take 2 tablets (20 mEq total) by mouth daily.  5 tablet  0  . Triamcinolone Acetonide (NASACORT ALLERGY 24HR NA) Place 1 spray into the nose 2 (two) times daily as needed (for allergies).      . Vitamin D-Vitamin K (VITAMIN K2-VITAMIN D3 PO) Take 5,000 mg by mouth daily.        Exam:  BP 211/115  Pulse 135  Temp(Src) 98.5 F (36.9 C) (Oral)  Resp 18  SpO2 96% Filed Vitals:   07/02/13 1805  BP: 211/115  Pulse: 135  Temp: 98.5 F (36.9 C)  TempSrc: Oral  Resp: 18  SpO2: 96%    Gen: Well NAD HEENT: EOMI,  MMM Lungs: Normal work of breathing. CTABL Heart: Tachycardia but regular no MRG Abd: NABS, Soft. NT, ND Exts: Non edematous BL  LE, warm and well perfused.   Twelve-lead EKG shows sinus tachycardia at 131 beats per minute. No ST segment changes. U waves are present in all leads.   Results for orders placed during the hospital encounter of 07/02/13 (from the past 24 hour(s))  POCT I-STAT, CHEM 8     Status: Abnormal   Collection Time    07/02/13  6:24 PM      Result Value Range   Sodium 141  137 - 147 mEq/L   Potassium 2.6 (*) 3.7 - 5.3 mEq/L   Chloride 99  96 - 112 mEq/L   BUN 13  6 - 23 mg/dL   Creatinine, Ser 1.00  0.50 - 1.35 mg/dL   Glucose, Bld 146 (*) 70 - 99 mg/dL   Calcium, Ion 1.20  1.12 - 1.23 mmol/L   TCO2 24  0 - 100 mmol/L   Hemoglobin 16.7  13.0 - 17.0 g/dL   HCT 49.0  39.0 - 52.0 %   Comment MD NOTIFIED, REPEAT TEST     No results found.  Assessment and Plan: 43 y.o. male with significant palpitations and hypertension in the setting of hypokalemia. I suspect patient's true potassium value to be much lower than 2.6 noted on i-STAT in the clinic today. Patient was given IV and transferred immediately to the most urgent care for evaluation and management.      Gregor Hams, MD 07/02/13 [phone]

## 2013-07-02 NOTE — ED Notes (Signed)
Pt is [facility] transfer.  Pt is hypertensive, tachycardic, and hypokalemic.  Pt alert and oriented and in NAD at this time.

## 2013-10-12 ENCOUNTER — Emergency Department (HOSPITAL_COMMUNITY)
Admission: EM | Admit: 2013-10-12 | Discharge: 2013-10-12 | Disposition: A | Discharge status: Home / Self Care | Payer: No Typology Code available for payment source | Source: Home / Self Care | Attending: Family Medicine | Admitting: Family Medicine

## 2013-10-12 ENCOUNTER — Encounter (HOSPITAL_COMMUNITY): Payer: Self-pay | Admitting: Emergency Medicine

## 2013-10-12 DIAGNOSIS — R220 Localized swelling, mass and lump, head: Secondary | ICD-10-CM

## 2013-10-12 DIAGNOSIS — R221 Localized swelling, mass and lump, neck: Secondary | ICD-10-CM

## 2013-10-12 MED ORDER — AZITHROMYCIN 250 MG PO TABS: 250.0000 mg | ORAL_TABLET | Freq: Every day | ORAL | Status: DC

## 2013-10-12 NOTE — Discharge Instructions (Signed)
Thank you for coming in today. Continue the Rhinocort nasal spray. Try azithromycin if not getting better Followup with primary care provider Call or go to the emergency room if you get worse, have trouble breathing, have chest pains, or palpitations.

## 2013-10-12 NOTE — ED Notes (Signed)
C/o left temple swollen See physician note

## 2013-10-12 NOTE — ED Provider Notes (Signed)
KAIRYN OLMEDA is a 43 y.o. male who presents to Urgent Care today for left facial swelling. Patient has had one month of left facial swelling. The swelling is most predominant at his left temple. He denies any significant pain or fever. He notes nasal congestion that has been alleviated recently with Nasacort spray. He denies any nausea vomiting or diarrhea. He denies any significant temple pain your pain or change in hearing. He feels well otherwise.   Past Medical History  Diagnosis Date  . Tachycardia    History  Substance Use Topics  . Smoking status: Never Smoker   . Smokeless tobacco: Not on file  . Alcohol Use: No   ROS as above Medications: No current facility-administered medications for this encounter.   Current Outpatient Prescriptions  Medication Sig Dispense Refill  . azithromycin (ZITHROMAX) 250 MG tablet Take 1 tablet (250 mg total) by mouth daily. Take first 2 tablets together, then 1 every day until finished.  6 tablet  0  . Capsicum, Cayenne, (CAYENNE PO) Take 1 tablet by mouth daily.      . Coenzyme Q10 (COQ-10 PO) Take 1 tablet by mouth daily.      Marland Kitchen GINKGO BILOBA PO Take 1 tablet by mouth daily.      Marland Kitchen GRAPE SEED EXTRACT PO Take 1 tablet by mouth daily.      . hydrochlorothiazide (HYDRODIURIL) 25 MG tablet Take 1 tablet (25 mg total) by mouth daily.  30 tablet  1  . L-ARGININE PO Take 1 tablet by mouth daily.      . L-CYSTEINE PO Take 1 tablet by mouth daily.      . magnesium oxide (MAG-OX) 400 MG tablet Take 800 mg by mouth daily.      Marland Kitchen NIACIN PO Take 1 tablet by mouth daily.      Marland Kitchen omega-3 acid ethyl esters (LOVAZA) 1 G capsule Take 2 g by mouth daily.      Marland Kitchen omeprazole-sodium bicarbonate (ZEGERID) 40-1100 MG per capsule Take 1 capsule by mouth daily as needed (for acid reflux).      Marland Kitchen OVER THE COUNTER MEDICATION Take 1 tablet by mouth daily. Tumeric      . potassium chloride (K-DUR) 10 MEQ tablet Take 1 tablet (10 mEq total) by mouth daily.  5 tablet  0   . Triamcinolone Acetonide (NASACORT ALLERGY 24HR NA) Place 1 spray into the nose 2 (two) times daily as needed (for allergies).      . Vitamin D-Vitamin K (VITAMIN K2-VITAMIN D3 PO) Take 5,000 mg by mouth daily.        Exam:  BP 143/74  Pulse 85  Temp(Src) 98.4 F (36.9 C) (Oral)  Resp 18  SpO2 96% Gen: Well NAD HEENT: EOMI,  MMM no appreciable facial swelling. Nontender temple. Nontender TMJ. Clear nasal discharge present. Nasal turbinates are mildly inflamed. Nontender maxillary sinus. Lungs: Normal work of breathing. CTABL Heart: RRR no MRG Abd: NABS, Soft. NT, ND Exts: Brisk capillary refill, warm and well perfused.   No results found for this or any previous visit (from the past 24 hour(s)). No results found.  Assessment and Plan: 43 y.o. male with subjective face swelling. Unclear etiology. Patient requests antibiotics for possible sinus infection. Recommend continuing Rhinocort and use azithromycin if not improved. Followup with primary care provider.  Discussed warning signs or symptoms. Please see discharge instructions. Patient expresses understanding.    Gregor Hams, MD 10/12/13 2045

## 2014-02-12 ENCOUNTER — Emergency Department (HOSPITAL_COMMUNITY)
Admission: EM | Admit: 2014-02-12 | Discharge: 2014-02-13 | Disposition: A | Discharge status: Home / Self Care | Payer: No Typology Code available for payment source | Attending: Emergency Medicine | Admitting: Emergency Medicine

## 2014-02-12 ENCOUNTER — Emergency Department (HOSPITAL_COMMUNITY): Payer: No Typology Code available for payment source

## 2014-02-12 ENCOUNTER — Encounter (HOSPITAL_COMMUNITY): Payer: Self-pay | Admitting: Emergency Medicine

## 2014-02-12 DIAGNOSIS — R000 Tachycardia, unspecified: Secondary | ICD-10-CM | POA: Diagnosis present

## 2014-02-12 DIAGNOSIS — E860 Dehydration: Secondary | ICD-10-CM | POA: Insufficient documentation

## 2014-02-12 DIAGNOSIS — Z880 Allergy status to penicillin: Secondary | ICD-10-CM | POA: Diagnosis not present

## 2014-02-12 DIAGNOSIS — R002 Palpitations: Secondary | ICD-10-CM | POA: Insufficient documentation

## 2014-02-12 DIAGNOSIS — Z79899 Other long term (current) drug therapy: Secondary | ICD-10-CM | POA: Diagnosis not present

## 2014-02-12 DIAGNOSIS — IMO0002 Reserved for concepts with insufficient information to code with codable children: Secondary | ICD-10-CM | POA: Diagnosis not present

## 2014-02-12 DIAGNOSIS — Z7982 Long term (current) use of aspirin: Secondary | ICD-10-CM | POA: Insufficient documentation

## 2014-02-12 LAB — I-STAT CHEM 8, ED
BUN: 25 mg/dL — AB (ref 6–23)
CHLORIDE: 100 meq/L (ref 96–112)
CREATININE: 1.1 mg/dL (ref 0.50–1.35)
Calcium, Ion: 1.13 mmol/L (ref 1.12–1.23)
GLUCOSE: 216 mg/dL — AB (ref 70–99)
HCT: 43 % (ref 39.0–52.0)
Hemoglobin: 14.6 g/dL (ref 13.0–17.0)
Potassium: 4.4 mEq/L (ref 3.7–5.3)
SODIUM: 133 meq/L — AB (ref 137–147)
TCO2: 23 mmol/L (ref 0–100)

## 2014-02-12 LAB — URINALYSIS, ROUTINE W REFLEX MICROSCOPIC
BILIRUBIN URINE: NEGATIVE
Glucose, UA: NEGATIVE mg/dL
KETONES UR: NEGATIVE mg/dL
LEUKOCYTES UA: NEGATIVE
NITRITE: NEGATIVE
PH: 5.5 (ref 5.0–8.0)
Protein, ur: NEGATIVE mg/dL
Specific Gravity, Urine: 1.01 (ref 1.005–1.030)
UROBILINOGEN UA: 0.2 mg/dL (ref 0.0–1.0)

## 2014-02-12 LAB — CBC WITH DIFFERENTIAL/PLATELET
BASOS PCT: 0 % (ref 0–1)
Basophils Absolute: 0 10*3/uL (ref 0.0–0.1)
EOS PCT: 3 % (ref 0–5)
Eosinophils Absolute: 0.2 10*3/uL (ref 0.0–0.7)
HEMATOCRIT: 39.6 % (ref 39.0–52.0)
Hemoglobin: 13.9 g/dL (ref 13.0–17.0)
LYMPHS PCT: 29 % (ref 12–46)
Lymphs Abs: 2.6 10*3/uL (ref 0.7–4.0)
MCH: 29.4 pg (ref 26.0–34.0)
MCHC: 35.1 g/dL (ref 30.0–36.0)
MCV: 83.7 fL (ref 78.0–100.0)
MONO ABS: 0.6 10*3/uL (ref 0.1–1.0)
Monocytes Relative: 6 % (ref 3–12)
NEUTROS ABS: 5.4 10*3/uL (ref 1.7–7.7)
Neutrophils Relative %: 62 % (ref 43–77)
PLATELETS: 205 10*3/uL (ref 150–400)
RBC: 4.73 MIL/uL (ref 4.22–5.81)
RDW: 12.1 % (ref 11.5–15.5)
WBC: 8.9 10*3/uL (ref 4.0–10.5)

## 2014-02-12 LAB — COMPREHENSIVE METABOLIC PANEL
ALBUMIN: 3.9 g/dL (ref 3.5–5.2)
ALK PHOS: 80 U/L (ref 39–117)
ALT: 31 U/L (ref 0–53)
ANION GAP: 16 — AB (ref 5–15)
AST: 20 U/L (ref 0–37)
BUN: 18 mg/dL (ref 6–23)
CO2: 21 meq/L (ref 19–32)
CREATININE: 0.91 mg/dL (ref 0.50–1.35)
Calcium: 9.1 mg/dL (ref 8.4–10.5)
Chloride: 96 mEq/L (ref 96–112)
GFR calc Af Amer: >90 mL/min (ref >90)
Glucose, Bld: 201 mg/dL — ABNORMAL HIGH (ref 70–99)
Potassium: 3.3 mEq/L — ABNORMAL LOW (ref 3.7–5.3)
Sodium: 133 mEq/L — ABNORMAL LOW (ref 137–147)
Total Bilirubin: 0.2 mg/dL — ABNORMAL LOW (ref 0.3–1.2)
Total Protein: 7.1 g/dL (ref 6.0–8.3)

## 2014-02-12 LAB — URINE MICROSCOPIC-ADD ON

## 2014-02-12 MED ORDER — SODIUM CHLORIDE 0.9 % IV BOLUS (SEPSIS)
1000.0000 mL | Freq: Once | INTRAVENOUS | Status: AC
  Administered 2014-02-12: 1000 mL via INTRAVENOUS

## 2014-02-12 MED ORDER — DILTIAZEM LOAD VIA INFUSION
15.0000 mg | Freq: Once | INTRAVENOUS | Status: AC
  Administered 2014-02-12: 15 mg via INTRAVENOUS
  Filled 2014-02-12: qty 15

## 2014-02-12 MED ORDER — DILTIAZEM HCL 100 MG IV SOLR
5.0000 mg/h | INTRAVENOUS | Status: DC
  Administered 2014-02-12: 5 mg/h via INTRAVENOUS

## 2014-02-12 MED ORDER — ASPIRIN EC 325 MG PO TBEC: 325.0000 mg | DELAYED_RELEASE_TABLET | Freq: Every day | ORAL | Status: DC

## 2014-02-12 NOTE — Discharge Instructions (Signed)
Dehydration, Adult °Dehydration is when you lose more fluids from the body than you take in. Vital organs like the kidneys, brain, and heart cannot function without a proper amount of fluids and salt. Any loss of fluids from the body can cause dehydration.  °CAUSES  °· Vomiting. °· Diarrhea. °· Excessive sweating. °· Excessive urine output. °· Fever. °SYMPTOMS  °Mild dehydration °· Thirst. °· Dry lips. °· Slightly dry mouth. °Moderate dehydration °· Very dry mouth. °· Sunken eyes. °· Skin does not bounce back quickly when lightly pinched and released. °· Dark urine and decreased urine production. °· Decreased tear production. °· Headache. °Severe dehydration °· Very dry mouth. °· Extreme thirst. °· Rapid, weak pulse (more than 100 beats per minute at rest). °· Cold hands and feet. °· Not able to sweat in spite of heat and temperature. °· Rapid breathing. °· Blue lips. °· Confusion and lethargy. °· Difficulty being awakened. °· Minimal urine production. °· No tears. °DIAGNOSIS  °Your caregiver will diagnose dehydration based on your symptoms and your exam. Blood and urine tests will help confirm the diagnosis. The diagnostic evaluation should also identify the cause of dehydration. °TREATMENT  °Treatment of mild or moderate dehydration can often be done at home by increasing the amount of fluids that you drink. It is best to drink small amounts of fluid more often. Drinking too much at one time can make vomiting worse. Refer to the home care instructions below. °Severe dehydration needs to be treated at the hospital where you will probably be given intravenous (IV) fluids that contain water and electrolytes. °HOME CARE INSTRUCTIONS  °· Ask your caregiver about specific rehydration instructions. °· Drink enough fluids to keep your urine clear or pale yellow. °· Drink small amounts frequently if you have nausea and vomiting. °· Eat as you normally do. °· Avoid: °¨ Foods or drinks high in sugar. °¨ Carbonated  drinks. °¨ Juice. °¨ Extremely hot or cold fluids. °¨ Drinks with caffeine. °¨ Fatty, greasy foods. °¨ Alcohol. °¨ Tobacco. °¨ Overeating. °¨ Gelatin desserts. °· Wash your hands well to avoid spreading bacteria and viruses. °· Only take over-the-counter or prescription medicines for pain, discomfort, or fever as directed by your caregiver. °· Ask your caregiver if you should continue all prescribed and over-the-counter medicines. °· Keep all follow-up appointments with your caregiver. °SEEK MEDICAL CARE IF: °· You have abdominal pain and it increases or stays in one area (localizes). °· You have a rash, stiff neck, or severe headache. °· You are irritable, sleepy, or difficult to awaken. °· You are weak, dizzy, or extremely thirsty. °SEEK IMMEDIATE MEDICAL CARE IF:  °· You are unable to keep fluids down or you get worse despite treatment. °· You have frequent episodes of vomiting or diarrhea. °· You have blood or green matter (bile) in your vomit. °· You have blood in your stool or your stool looks black and tarry. °· You have not urinated in 6 to 8 hours, or you have only urinated a small amount of very dark urine. °· You have a fever. °· You faint. °MAKE SURE YOU:  °· Understand these instructions. °· Will watch your condition. °· Will get help right away if you are not doing well or get worse. °Document Released: 06/15/2005 Document Revised: 09/07/2011 Document Reviewed: 02/02/2011 °[employer]® Patient Information ©2015 [employer]. This information is not intended to replace advice given to you by your health care provider. Make sure you discuss any questions you have with your health care   provider.

## 2014-02-12 NOTE — ED Provider Notes (Signed)
CSN: [id number]     Arrival date & time 02/12/14  1745 History   None    Chief Complaint  Patient presents with  . Tachycardia     (Consider location/radiation/quality/duration/timing/severity/associated sxs/prior Treatment) HPI  Donald Jenkins is a 43 y.o. male with a past medical history of hypertension, hypokalemia, tachycardia, presenting for tachycardia. Patient states that he felt his normal self until around 5 PM today where he felt "odd" after eating. Patient had a sensation of palpitations. Has a past history of palpitations at one point which were associated with hypokalemia. He denies associated chest pain or shortness of breath. Denies nausea, vomiting, diaphoresis, or other associated symptoms. States "it just doesn't feel good." Patient endorses normal oral intake, and hydration status. Denies any recent illnesses, or symptoms such as fever, chills, or cough. Patient states he takes lisinopril, and potassium supplements. Also uses various herbal supplements as well. Is on Seroquel for bipolar disorder. Denies any recreational drugs, stimulant use, smoking, or heavy EtOH. Endorses occasional EtOH on weekends, had a "6 pack of beer " on Saturday.    Past Medical History  Diagnosis Date  . Tachycardia    History reviewed. No pertinent past surgical history. History reviewed. No pertinent family history. History  Substance Use Topics  . Smoking status: Never Smoker   . Smokeless tobacco: Not on file  . Alcohol Use: No    Review of Systems  Constitutional: Negative.   HENT: Negative.   Eyes: Negative.   Respiratory: Negative.   Cardiovascular: Positive for palpitations.  Gastrointestinal: Negative.   Endocrine: Negative.   Genitourinary: Negative.   Musculoskeletal: Negative.   Skin: Negative.   Allergic/Immunologic: Negative.   Neurological: Negative.   Hematological: Negative.   Psychiatric/Behavioral: Negative.       Allergies  Penicillins  Home  Medications   Prior to Admission medications   Medication Sig Start Date End Date Taking? Authorizing Provider  Coenzyme Q10 (COQ-10 PO) Take 1 tablet by mouth daily.   Yes Historical Provider, MD  Esomeprazole Magnesium (NEXIUM PO) Take 1 capsule by mouth daily.   Yes Historical Provider, MD  GINKGO BILOBA PO Take 1 tablet by mouth daily.   Yes Historical Provider, MD  L-ARGININE PO Take 1 tablet by mouth daily.   Yes Historical Provider, MD  lisinopril-hydrochlorothiazide (PRINZIDE,ZESTORETIC) 10-12.5 MG per tablet Take 1 tablet by mouth daily. 01/13/14  Yes Historical Provider, MD  magnesium oxide (MAG-OX) 400 MG tablet Take 800 mg by mouth daily.   Yes Historical Provider, MD  Omega-3 Fatty Acids (FISH OIL PO) Take 1 capsule by mouth daily. Over the counter   Yes Historical Provider, MD  OVER THE COUNTER MEDICATION Take 1 tablet by mouth daily. Tumeric   Yes Historical Provider, MD  POTASSIUM PO Take 1 tablet by mouth daily. Over the counter   Yes Historical Provider, MD  QUEtiapine (SEROQUEL) 400 MG tablet Take 400 mg by mouth daily. 01/03/14  Yes Historical Provider, MD  Triamcinolone Acetonide (NASACORT ALLERGY 24HR NA) Place 1 spray into the nose 2 (two) times daily as needed (for allergies).   Yes Historical Provider, MD  Vitamin D-Vitamin K (VITAMIN K2-VITAMIN D3 PO) Take 5,000 mg by mouth daily.   Yes Historical Provider, MD  zaleplon (SONATA) 10 MG capsule Take 10 mg by mouth at bedtime as needed for sleep.  01/29/14  Yes Historical Provider, MD  aspirin EC 325 MG tablet Take 1 tablet (325 mg total) by mouth daily. 02/12/14   Hoyle Sauer,  MD   BP 131/72  Pulse 150  Temp(Src) 98.1 F (36.7 C) (Oral)  Resp 18  SpO2 99% Physical Exam  Nursing note and vitals reviewed. Constitutional: He is oriented to person, place, and time. He appears well-developed and well-nourished. No distress.  HENT:  Head: Normocephalic and atraumatic.  Right Ear: External ear normal.  Left Ear: External  ear normal.  Nose: Nose normal.  Mouth/Throat: Oropharynx is clear and moist. No oropharyngeal exudate.  Eyes: Conjunctivae and EOM are normal. Pupils are equal, round, and reactive to light. Right eye exhibits no discharge. Left eye exhibits no discharge. No scleral icterus.  Neck: Normal range of motion. Neck supple. No JVD present. No tracheal deviation present. No thyromegaly present.  Cardiovascular: Regular rhythm, normal heart sounds and intact distal pulses.  Tachycardia present.  Exam reveals no gallop, no friction rub and no decreased pulses.   No murmur heard. Pulmonary/Chest: Effort normal and breath sounds normal. No stridor. No respiratory distress. He has no wheezes. He has no rales. He exhibits no tenderness.  Abdominal: Soft. Bowel sounds are normal. He exhibits no distension. There is no tenderness. There is no rebound and no guarding.  Musculoskeletal: Normal range of motion. He exhibits no edema and no tenderness.  Lymphadenopathy:    He has no cervical adenopathy.  Neurological: He is alert and oriented to person, place, and time. He is not disoriented. He displays no atrophy and no tremor. No cranial nerve deficit or sensory deficit. He exhibits normal muscle tone. He displays no seizure activity. Coordination normal. GCS eye subscore is 4. GCS verbal subscore is 5. GCS motor subscore is 6.  Skin: Skin is warm. No rash noted. He is diaphoretic. No erythema. There is pallor.  Psychiatric: He has a normal mood and affect. His behavior is normal. Judgment and thought content normal.    ED Course  Procedures (including critical care time) Labs Review Labs Reviewed  COMPREHENSIVE METABOLIC PANEL - Abnormal; Notable for the following:    Sodium 133 (*)    Potassium 3.3 (*)    Glucose, Bld 201 (*)    Total Bilirubin 0.2 (*)    Anion gap 16 (*)    All other components within normal limits  URINALYSIS, ROUTINE W REFLEX MICROSCOPIC - Abnormal; Notable for the following:     Hgb urine dipstick SMALL (*)    All other components within normal limits  I-STAT CHEM 8, ED - Abnormal; Notable for the following:    Sodium 133 (*)    BUN 25 (*)    Glucose, Bld 216 (*)    All other components within normal limits  CBC WITH DIFFERENTIAL  URINE MICROSCOPIC-ADD ON    Imaging Review Dg Chest Portable 1 View  02/12/2014   CLINICAL DATA:  Tachycardia for a few hours.  History of smoking.  EXAM: PORTABLE CHEST - 1 VIEW  COMPARISON:  07/02/2013  FINDINGS: The heart size and mediastinal contours are within normal limits. Both lungs are clear. The visualized skeletal structures are unremarkable.  IMPRESSION: No active disease.   Electronically Signed   By: Shon Hale M.D.   On: 02/12/2014 19:05     EKG Interpretation   Date/Time:  Monday February 12 2014 19:08:00 EDT Ventricular Rate:  119 PR Interval:  164 QRS Duration: 84 QT Interval:  326 QTC Calculation: 459 R Axis:   28 Text Interpretation:  Sinus tachycardia Probable anteroseptal infarct, old  Confirmed by Christy Gentles  MD, DONALD ([id number]) on 02/12/2014 7:13:55 PM  12-Feb-2014 17:56:57 Vent. rate 153 BPM QRS duration 84 ms QT/QTc 332/530 ms Supraventricular tachycardia vs. Atrial Flutter  Possible Inferior infarct , age undetermined Cannot rule out Anterior infarct , age undetermined Abnormal ECG  MDM   Final diagnoses:  Tachycardia  Dehydration  Palpitations    Upon arrival patient rhythm appearing to be SVT, by machine read, or atrial flutter at a rate of 150. Morphology most consistent with a flutter, but possibly T and P wave overlap. Difficult to distinguish due to rate. Rate appears more consistent with flutter, than SVT. Will treat initially with diltiazem and will reevaluate. Following initial bolus and diltiazem infusion, rate slowed to mid 120s. Repeat EKG shows sinus tach, now that T and P waves appear separated.  However, difficult to distinguish between possible conversion out of atrial flutter.  Diltiazem drip discontinued, and fluid boluses administered.   Patient had significant improvement in rate following infusion of 2 L of normal saline. Initially patient did not endorse, signs and symptoms of dehydration, however on further questioning, he recently started a job working in [employer], and endorses only having approximately 1.5 L of fluid today, and states that he is not "good about drinking water. "  Much of what he does drink as caffeinated as well.  On laboratory workup, BUN/creatinine ratio consistent with dehydration. Urine for analysis was not obtained until patient had received fluid boluses, and was not concentrated at that time.  Patient symptoms resolved with fluid boluses. Received a total of 3 L. Tolerated oral fluids as well. Advised patient to limit caffeinated beverages, due to diuretic effect. In addition advised on frequent intake of water particularly during hot conditions.  Due to question of possible transient arrhythmia will discharge the patient home on 325 mg aspirin daily. Low CHADS2 score.  No significant QTC changes associated with his Seroquel use. Have advised the patient to follow up with cardiology within a week for further recommendations, due to his recurrent tachycardias. Patient has a picture otherwise largely of dehydration today which resolved with fluids. Advised patient also to follow up with PCP, for additional laboratory workup. Question of elevated blood sugars in the past, possibly contributing to dehydration. Patient otherwise does not have a history of diabetes and will need followup for his renal function, and fasting blood sugar.  Patient given return precautions for palpitations and dehydration.  Advised to return for worsening symptoms including chest pain, shortness of breath, severe headache, intractable nausea or vomiting, fever, or chills, inability to take medications, or other acute concerns.  Advised to follow up with PCP in 2 days, and  cardiology in 1 week.  Patient in agreement with and expressed understanding of follow plan, plan of care, and return precautions.  All questions answered prior to discharge.  Patient was discharged in stable condition, ambulating without difficulty.  Patient care was discussed with my attending, Dr. Christy Gentles.    Hoyle Sauer, MD 02/13/14 [phone]

## 2014-02-12 NOTE — ED Notes (Signed)
Pt given PO fluids per Dr Estanislado Spire

## 2014-02-12 NOTE — ED Provider Notes (Signed)
Patient seen/examined in the Emergency Department in conjunction with Resident Physician Provider Brooten Patient reports palpitations Exam : awake/alert, tachycardic, suspect possible atrial flutter Plan: will start IV carizem   Sharyon Cable, MD 02/12/14 [id number]

## 2014-02-12 NOTE — ED Notes (Signed)
Reports having palpitations, generalized weakness and nausea today. HR 150. Reports hx of hypokalemia which caused tachycardia in past.

## 2014-02-12 NOTE — ED Notes (Signed)
Denies chest pain but feels heart is racing

## 2014-02-12 NOTE — ED Provider Notes (Signed)
Repeat EKG shows HR 119 appears sinus tachycardia   EKG Interpretation  Date/Time:  Monday February 12 2014 19:08:00 EDT Ventricular Rate:  119 PR Interval:  164 QRS Duration: 84 QT Interval:  326 QTC Calculation: 459 R Axis:   28 Text Interpretation:  Sinus tachycardia Probable anteroseptal infarct, old Confirmed by Christy Gentles  MD, Hanah Moultry ([id number]) on 02/12/2014 7:13:55 PM        Sharyon Cable, MD 02/12/14 [phone]

## 2014-02-13 NOTE — ED Provider Notes (Signed)
I have personally seen and examined the patient.  I have discussed the plan of care with the resident.  I have reviewed the documentation on PMH/FH/Soc. History.  I have reviewed the documentation of the resident and agree.   Sharyon Cable, MD 02/13/14 [phone]

## 2014-07-23 ENCOUNTER — Ambulatory Visit (INDEPENDENT_AMBULATORY_CARE_PROVIDER_SITE_OTHER): Payer: 59 | Admitting: Internal Medicine

## 2014-07-23 ENCOUNTER — Telehealth: Payer: Self-pay

## 2014-07-23 VITALS — BP 136/82 | HR 86 | Temp 98.3°F | Resp 16 | Ht 72.0 in | Wt 194.0 lb

## 2014-07-23 DIAGNOSIS — N529 Male erectile dysfunction, unspecified: Secondary | ICD-10-CM

## 2014-07-23 DIAGNOSIS — G4700 Insomnia, unspecified: Secondary | ICD-10-CM

## 2014-07-23 DIAGNOSIS — I10 Essential (primary) hypertension: Secondary | ICD-10-CM

## 2014-07-23 DIAGNOSIS — Z79899 Other long term (current) drug therapy: Secondary | ICD-10-CM

## 2014-07-23 LAB — POCT URINALYSIS DIPSTICK
Bilirubin, UA: NEGATIVE
Blood, UA: NEGATIVE
GLUCOSE UA: NEGATIVE
KETONES UA: NEGATIVE
Leukocytes, UA: NEGATIVE
Nitrite, UA: NEGATIVE
Protein, UA: NEGATIVE
Spec Grav, UA: 1.015
Urobilinogen, UA: 0.2
pH, UA: 7

## 2014-07-23 LAB — POCT CBC
GRANULOCYTE PERCENT: 76.2 % (ref 37–80)
HCT, POC: 43.7 % (ref 43.5–53.7)
Hemoglobin: 14.2 g/dL (ref 14.1–18.1)
Lymph, poc: 1.6 (ref 0.6–3.4)
MCH, POC: 29.3 pg (ref 27–31.2)
MCHC: 32.6 g/dL (ref 31.8–35.4)
MCV: 90 fL (ref 80–97)
MID (cbc): 0.5 (ref 0–0.9)
MPV: 7.6 fL (ref 0–99.8)
PLATELET COUNT, POC: 311 10*3/uL (ref 142–424)
POC GRANULOCYTE: 6.7 (ref 2–6.9)
POC LYMPH %: 18.4 % (ref 10–50)
POC MID %: 5.4 %M (ref 0–12)
RBC: 4.86 M/uL (ref 4.69–6.13)
RDW, POC: 13.9 %
WBC: 8.8 10*3/uL (ref 4.6–10.2)

## 2014-07-23 LAB — COMPREHENSIVE METABOLIC PANEL
ALBUMIN: 4.7 g/dL (ref 3.5–5.2)
ALT: 16 U/L (ref 0–53)
AST: 17 U/L (ref 0–37)
Alkaline Phosphatase: 63 U/L (ref 39–117)
BILIRUBIN TOTAL: 0.3 mg/dL (ref 0.2–1.2)
BUN: 16 mg/dL (ref 6–23)
CALCIUM: 10.1 mg/dL (ref 8.4–10.5)
CO2: 26 meq/L (ref 19–32)
Chloride: 100 mEq/L (ref 96–112)
Creat: 0.7 mg/dL (ref 0.50–1.35)
Glucose, Bld: 105 mg/dL — ABNORMAL HIGH (ref 70–99)
Potassium: 4.5 mEq/L (ref 3.5–5.3)
SODIUM: 137 meq/L (ref 135–145)
Total Protein: 7.4 g/dL (ref 6.0–8.3)

## 2014-07-23 LAB — POCT UA - MICROSCOPIC ONLY
Bacteria, U Microscopic: NEGATIVE
Casts, Ur, LPF, POC: NEGATIVE
Crystals, Ur, HPF, POC: NEGATIVE
Mucus, UA: NEGATIVE
Yeast, UA: NEGATIVE

## 2014-07-23 LAB — LIPID PANEL
CHOLESTEROL: 175 mg/dL (ref 0–200)
HDL: 67 mg/dL (ref >39)
LDL Cholesterol: 78 mg/dL (ref 0–99)
Total CHOL/HDL Ratio: 2.6 Ratio
Triglycerides: 149 mg/dL (ref <150)
VLDL: 30 mg/dL (ref 0–40)

## 2014-07-23 LAB — TSH: TSH: 1.138 u[IU]/mL (ref 0.350–4.500)

## 2014-07-23 MED ORDER — LISINOPRIL-HYDROCHLOROTHIAZIDE 10-12.5 MG PO TABS: 1.0000 | ORAL_TABLET | Freq: Every day | ORAL | Status: DC

## 2014-07-23 MED ORDER — SILDENAFIL CITRATE 50 MG PO TABS: 50.0000 mg | ORAL_TABLET | Freq: Every day | ORAL | Status: DC | PRN

## 2014-07-23 NOTE — Progress Notes (Signed)
Subjective:    Patient ID: Donald Jenkins, male    DOB: 1970/09/09, 44 y.o.   MRN: [id number]  HPI New patient to our practise. 44 yo, healthy and exercising. Lost 65 lbs thru calorie counting and diet. Needs new MD because insurance changed, on lisinopril/hct for blood pressure and sees psychiatrist for anxiety and insomnia. Also requests trial of ED med, new relationship.    Review of Systems  Constitutional: Negative.   HENT: Negative.   Eyes: Negative.   Respiratory: Negative.   Cardiovascular: Negative.   Gastrointestinal: Negative.   Endocrine: Negative.   Genitourinary: Negative.   Neurological: Negative.   Psychiatric/Behavioral: Positive for sleep disturbance. Negative for suicidal ideas, hallucinations, behavioral problems, confusion, self-injury, dysphoric mood, decreased concentration and agitation. The patient is nervous/anxious. The patient is not hyperactive.        Objective:   Physical Exam  Constitutional: He is oriented to person, place, and time. He appears well-developed and well-nourished. No distress.  HENT:  Head: Normocephalic.  Right Ear: External ear normal.  Left Ear: External ear normal.  Nose: Nose normal.  Mouth/Throat: Oropharynx is clear and moist.  Eyes: Conjunctivae and EOM are normal. Pupils are equal, round, and reactive to light.  Neck: Normal range of motion. Neck supple. No tracheal deviation present. No thyromegaly present.  Cardiovascular: Normal rate, regular rhythm, normal heart sounds and intact distal pulses.   Pulmonary/Chest: Effort normal and breath sounds normal. He exhibits no tenderness.  Abdominal: Soft. Bowel sounds are normal.  Genitourinary: Penis normal.  Musculoskeletal: Normal range of motion.  Lymphadenopathy:    He has no cervical adenopathy.  Neurological: He is alert and oriented to person, place, and time. He has normal reflexes. No cranial nerve deficit. He exhibits normal muscle tone. Coordination  normal.  Psychiatric: He has a normal mood and affect. His behavior is normal. Judgment and thought content normal.  Vitals reviewed.  Results for orders placed or performed in visit on 07/23/14  POCT CBC  Result Value Ref Range   WBC 8.8 4.6 - 10.2 K/uL   Lymph, poc 1.6 0.6 - 3.4   POC LYMPH PERCENT 18.4 10 - 50 %L   MID (cbc) 0.5 0 - 0.9   POC MID % 5.4 0 - 12 %M   POC Granulocyte 6.7 2 - 6.9   Granulocyte percent 76.2 37 - 80 %G   RBC 4.86 4.69 - 6.13 M/uL   Hemoglobin 14.2 14.1 - 18.1 g/dL   HCT, POC 43.7 43.5 - 53.7 %   MCV 90.0 80 - 97 fL   MCH, POC 29.3 27 - 31.2 pg   MCHC 32.6 31.8 - 35.4 g/dL   RDW, POC 13.9 %   Platelet Count, POC 311 142 - 424 K/uL   MPV 7.6 0 - 99.8 fL  POCT UA - Microscopic Only  Result Value Ref Range   WBC, Ur, HPF, POC 1-2    RBC, urine, microscopic 0-1    Bacteria, U Microscopic neg    Mucus, UA neg    Epithelial cells, urine per micros 0-1    Crystals, Ur, HPF, POC neg    Casts, Ur, LPF, POC neg    Yeast, UA neg   POCT urinalysis dipstick  Result Value Ref Range   Color, UA yellow    Clarity, UA clear    Glucose, UA neg    Bilirubin, UA neg    Ketones, UA neg    Spec Grav, UA 1.015  Blood, UA neg    pH, UA 7.0    Protein, UA neg    Urobilinogen, UA 0.2    Nitrite, UA neg    Leukocytes, UA Negative           Assessment & Plan:  HTN controlled Insomnia/anxiety Dr. Candis Schatz ED--counseled

## 2014-07-23 NOTE — Patient Instructions (Signed)
Stress Stress-related medical problems are becoming increasingly common. The body has a built-in physical response to stressful situations. Faced with pressure, challenge or danger, we need to react quickly. Our bodies release hormones such as cortisol and adrenaline to help do this. These hormones are part of the "fight or flight" response and affect the metabolic rate, heart rate and blood pressure, resulting in a heightened, stressed state that prepares the body for optimum performance in dealing with a stressful situation. It is likely that early man required these mechanisms to stay alive, but usually modern stresses do not call for this, and the same hormones released in today's world can damage health and reduce coping ability. CAUSES  Pressure to perform at work, at school or in sports.  Threats of physical violence.  Money worries.  Arguments.  Family conflicts.  Divorce or separation from significant other.  Bereavement.  New job or unemployment.  Changes in location.  Alcohol or drug abuse. SOMETIMES, THERE IS NO PARTICULAR REASON FOR DEVELOPING STRESS. Almost all people are at risk of being stressed at some time in their lives. It is important to know that some stress is temporary and some is long term.  Temporary stress will go away when a situation is resolved. Most people can cope with short periods of stress, and it can often be relieved by relaxing, taking a walk or getting any type of exercise, chatting through issues with friends, or having a good night's sleep.  Chronic (long-term, continuous) stress is much harder to deal with. It can be psychologically and emotionally damaging. It can be harmful both for an individual and for friends and family. SYMPTOMS Everyone reacts to stress differently. There are some common effects that help [place] recognize it. In times of extreme stress, people may:  Shake uncontrollably.  Breathe faster and deeper than normal  (hyperventilate).  Vomit.  For people with asthma, stress can trigger an attack.  For some people, stress may trigger migraine headaches, ulcers, and body pain. PHYSICAL EFFECTS OF STRESS MAY INCLUDE:  Loss of energy.  Skin problems.  Aches and pains resulting from tense muscles, including neck ache, backache and tension headaches.  Increased pain from arthritis and other conditions.  Irregular heart beat (palpitations).  Periods of irritability or anger.  Apathy or depression.  Anxiety (feeling uptight or worrying).  Unusual behavior.  Loss of appetite.  Comfort eating.  Lack of concentration.  Loss of, or decreased, sex-drive.  Increased smoking, drinking, or recreational drug use.  For women, missed periods.  Ulcers, joint pain, and muscle pain. Post-traumatic stress is the stress caused by any serious accident, strong emotional damage, or extremely difficult or violent experience such as rape or war. Post-traumatic stress victims can experience mixtures of emotions such as fear, shame, depression, guilt or anger. It may include recurrent memories or images that may be haunting. These feelings can last for weeks, months or even years after the traumatic event that triggered them. Specialized treatment, possibly with medicines and psychological therapies, is available. If stress is causing physical symptoms, severe distress or making it difficult for you to function as normal, it is worth seeing your caregiver. It is important to remember that although stress is a usual part of life, extreme or prolonged stress can lead to other illnesses that will need treatment. It is better to visit a doctor sooner rather than later. Stress has been linked to the development of high blood pressure and heart disease, as well as insomnia and depression.  There is no diagnostic test for stress since everyone reacts to it differently. But a caregiver will be able to spot the physical  symptoms, such as:  Headaches.  Shingles.  Ulcers. Emotional distress such as intense worry, low mood or irritability should be detected when the doctor asks pertinent questions to identify any underlying problems that might be the cause. In case there are physical reasons for the symptoms, the doctor may also want to do some tests to exclude certain conditions. If you feel that you are suffering from stress, try to identify the aspects of your life that are causing it. Sometimes you may not be able to change or avoid them, but even a small change can have a positive ripple effect. A simple lifestyle change can make all the difference. STRATEGIES THAT CAN HELP DEAL WITH STRESS:  Delegating or sharing responsibilities.  Avoiding confrontations.  Learning to be more assertive.  Regular exercise.  Avoid using alcohol or street drugs to cope.  Eating a healthy, balanced diet, rich in fruit and vegetables and proteins.  Finding humor or absurdity in stressful situations.  Never taking on more than you know you can handle comfortably.  Organizing your time better to get as much done as possible.  Talking to friends or family and sharing your thoughts and fears.  Listening to music or relaxation tapes.  Relaxation techniques like deep breathing, meditation, and yoga.  Tensing and then relaxing your muscles, starting at the toes and working up to the head and neck. If you think that you would benefit from help, either in identifying the things that are causing your stress or in learning techniques to help you relax, see a caregiver who is capable of helping you with this. Rather than relying on medications, it is usually better to try and identify the things in your life that are causing stress and try to deal with them. There are many techniques of managing stress including counseling, psychotherapy, aromatherapy, yoga, and exercise. Your caregiver can help you determine what is best  for you. Document Released: 09/05/2002 Document Revised: 06/20/2013 Document Reviewed: 08/02/2007 [facility] Patient Information 2015 [place], [place]. This information is not intended to replace advice given to you by your health care provider. Make sure you discuss any questions you have with your health care provider. Erectile Dysfunction Erectile dysfunction is the inability to get or sustain a good enough erection to have sexual intercourse. Erectile dysfunction may involve:  Inability to get an erection.  Lack of enough hardness to allow penetration.  Loss of the erection before sex is finished.  Premature ejaculation. CAUSES  Certain drugs, such as:  Pain relievers.  Antihistamines.  Antidepressants.  Blood pressure medicines.  Water pills (diuretics).  Ulcer medicines.  Muscle relaxants.  Illegal drugs.  Excessive drinking.  Psychological causes, such as:  Anxiety.  Depression.  Sadness.  Exhaustion.  Performance fear.  Stress.  Physical causes, such as:  Artery problems. This may include diabetes, smoking, liver disease, or atherosclerosis.  High blood pressure.  Hormonal problems, such as low testosterone.  Obesity.  Nerve problems. This may include back or pelvic injuries, diabetes mellitus, multiple sclerosis, or Parkinson disease. SYMPTOMS  Inability to get an erection.  Lack of enough hardness to allow penetration.  Loss of the erection before sex is finished.  Premature ejaculation.  Normal erections at some times, but with frequent unsatisfactory episodes.  Orgasms that are not satisfactory in sensation or frequency.  Low sexual satisfaction in either partner because  of erection problems.  A curved penis occurring with erection. The curve may cause pain or may be too curved to allow for intercourse.  Never having nighttime erections. DIAGNOSIS Your caregiver can often diagnose this condition by:  Performing a physical exam  to find other diseases or specific problems with the penis.  Asking you detailed questions about the problem.  Performing blood tests to check for diabetes mellitus or to measure hormone levels.  Performing urine tests to find other underlying health conditions.  Performing an ultrasound exam to check for scarring.  Performing a test to check blood flow to the penis.  Doing a sleep study at home to measure nighttime erections. TREATMENT   You may be prescribed medicines by mouth.  You may be given medicine injections into the penis.  You may be prescribed a vacuum pump with a ring.  Penile implant surgery may be performed. You may receive:  An inflatable implant.  A semirigid implant.  Blood vessel surgery may be performed. HOME CARE INSTRUCTIONS  If you are prescribed oral medicine, you should take the medicine as prescribed. Do not increase the dosage without first discussing it with your physician.  If you are using self-injections, be careful to avoid any veins that are on the surface of the penis. Apply pressure to the injection site for 5 minutes.  If you are using a vacuum pump, make sure you have read the instructions before using it. Discuss any questions with your physician before taking the pump home. SEEK MEDICAL CARE IF:  You experience pain that is not responsive to the pain medicine you have been prescribed.  You experience nausea or vomiting. SEEK IMMEDIATE MEDICAL CARE IF:   When taking oral or injectable medications, you experience an erection that lasts longer than 4 hours. If your physician is unavailable, go to the nearest emergency room for evaluation. An erection that lasts much longer than 4 hours can result in permanent damage to your penis.  You have pain that is severe.  You develop redness, severe pain, or severe swelling of your penis.  You have redness spreading up into your groin or lower abdomen.  You are unable to pass your  urine. Document Released: 06/12/2000 Document Revised: 02/15/2013 Document Reviewed: 11/17/2012 [facility] Patient Information 2015 [place], [place]. This information is not intended to replace advice given to you by your health care provider. Make sure you discuss any questions you have with your health care provider. DASH Eating Plan DASH stands for "Dietary Approaches to Stop Hypertension." The DASH eating plan is a healthy eating plan that has been shown to reduce high blood pressure (hypertension). Additional health benefits may include reducing the risk of type 2 diabetes mellitus, heart disease, and stroke. The DASH eating plan may also help with weight loss. WHAT DO I NEED TO KNOW ABOUT THE DASH EATING PLAN? For the DASH eating plan, you will follow these general guidelines:  Choose foods with a percent daily value for sodium of less than 5% (as listed on the food label).  Use salt-free seasonings or herbs instead of table salt or sea salt.  Check with your health care provider or pharmacist before using salt substitutes.  Eat lower-sodium products, often labeled as "lower sodium" or "no salt added."  Eat fresh foods.  Eat more vegetables, fruits, and low-fat dairy products.  Choose whole grains. Look for the word "whole" as the first word in the ingredient list.  Choose fish and skinless chicken or [place] more  often than red meat. Limit fish, poultry, and meat to 6 oz (170 g) each day.  Limit sweets, desserts, sugars, and sugary drinks.  Choose heart-healthy fats.  Limit cheese to 1 oz (28 g) per day.  Eat more home-cooked food and less restaurant, buffet, and fast food.  Limit fried foods.  Cook foods using methods other than frying.  Limit canned vegetables. If you do use them, rinse them well to decrease the sodium.  When eating at a restaurant, ask that your food be prepared with less salt, or no salt if possible. WHAT FOODS CAN I EAT? Seek help from a dietitian for  individual calorie needs. Grains Whole grain or whole wheat bread. Brown rice. Whole grain or whole wheat pasta. Quinoa, bulgur, and whole grain cereals. Low-sodium cereals. Corn or whole wheat flour tortillas. Whole grain cornbread. Whole grain crackers. Low-sodium crackers. Vegetables Fresh or frozen vegetables (raw, steamed, roasted, or grilled). Low-sodium or reduced-sodium tomato and vegetable juices. Low-sodium or reduced-sodium tomato sauce and paste. Low-sodium or reduced-sodium canned vegetables.  Fruits All fresh, canned (in natural juice), or frozen fruits. Meat and Other Protein Products Ground beef (85% or leaner), grass-fed beef, or beef trimmed of fat. Skinless chicken or [place]. Ground chicken or [place]. Pork trimmed of fat. All fish and seafood. Eggs. Dried beans, peas, or lentils. Unsalted nuts and seeds. Unsalted canned beans. Dairy Low-fat dairy products, such as skim or 1% milk, 2% or reduced-fat cheeses, low-fat ricotta or cottage cheese, or plain low-fat yogurt. Low-sodium or reduced-sodium cheeses. Fats and Oils Tub margarines without trans fats. Light or reduced-fat mayonnaise and salad dressings (reduced sodium). Avocado. Safflower, olive, or canola oils. Natural peanut or almond butter. Other Unsalted popcorn and pretzels. The items listed above may not be a complete list of recommended foods or beverages. Contact your dietitian for more options. WHAT FOODS ARE NOT RECOMMENDED? Grains White bread. White pasta. White rice. Refined cornbread. Bagels and croissants. Crackers that contain trans fat. Vegetables Creamed or fried vegetables. Vegetables in a cheese sauce. Regular canned vegetables. Regular canned tomato sauce and paste. Regular tomato and vegetable juices. Fruits Dried fruits. Canned fruit in light or heavy syrup. Fruit juice. Meat and Other Protein Products Fatty cuts of meat. Ribs, chicken wings, bacon, sausage, bologna, salami, chitterlings, fatback, hot  dogs, bratwurst, and packaged luncheon meats. Salted nuts and seeds. Canned beans with salt. Dairy Whole or 2% milk, cream, half-and-half, and cream cheese. Whole-fat or sweetened yogurt. Full-fat cheeses or blue cheese. Nondairy creamers and whipped toppings. Processed cheese, cheese spreads, or cheese curds. Condiments Onion and garlic salt, seasoned salt, table salt, and sea salt. Canned and packaged gravies. Worcestershire sauce. Tartar sauce. Barbecue sauce. Teriyaki sauce. Soy sauce, including reduced sodium. Steak sauce. Fish sauce. Oyster sauce. Cocktail sauce. Horseradish. Ketchup and mustard. Meat flavorings and tenderizers. Bouillon cubes. Hot sauce. Tabasco sauce. Marinades. Taco seasonings. Relishes. Fats and Oils Butter, stick margarine, lard, shortening, ghee, and bacon fat. Coconut, palm kernel, or palm oils. Regular salad dressings. Other Pickles and olives. Salted popcorn and pretzels. The items listed above may not be a complete list of foods and beverages to avoid. Contact your dietitian for more information. WHERE CAN I FIND MORE INFORMATION? National Heart, Lung, and Blood Institute: travelstabloid.com Document Released: 06/04/2011 Document Revised: 10/30/2013 Document Reviewed: 04/19/2013 [facility] Patient Information 2015 [place], [place]. This information is not intended to replace advice given to you by your health care provider. Make sure you discuss any questions you have with your  health care provider.  

## 2014-07-23 NOTE — Telephone Encounter (Signed)
Pt states his insurance won't pay for the VIAGRA he was prescribed, but the [facility] [address] will pay for the generic Wanted to know if the Dr would ok that. Was given 50mg s but it only comes in 20 mgs on the generic form Please call [place]

## 2014-07-23 NOTE — Telephone Encounter (Signed)
The 20 mg dose is for people with a condition called pulmonary artery hypertension. It's meant to be dosed TID.  I'm not familiar with using this formulation for the treatment of ED.  Contacted the pharmacy. Spoke with Dr. Lorelei Pont. Patient will have to pay out of pocket, and using it to treat ED is 'off-label.'  Advise him that if he still desires to take it, we can send in sildenafil 20 mg, 1-2 PO prior to intercourse. #10, no refills.  He should start with 20 mg.  If that is not effective, he can increase to 40 mg (2 tablets).

## 2014-07-23 NOTE — Telephone Encounter (Signed)
Can we change? Rx pending.

## 2014-07-24 MED ORDER — SILDENAFIL CITRATE 20 MG PO TABS: 20.0000 mg | ORAL_TABLET | ORAL | Status: DC | PRN

## 2014-07-24 NOTE — Telephone Encounter (Signed)
Pt called regarding this matter. Advised him of previous message.

## 2014-07-24 NOTE — Telephone Encounter (Signed)
Sent in medication per Chelle's instruction. Pt advised of the information below.

## 2014-07-24 NOTE — Telephone Encounter (Signed)
Can we send Rx in?

## 2014-07-24 NOTE — Telephone Encounter (Signed)
Pt came in to 102 today regarding this issue. Advised him of chelle's message. Patient states he would like the generic medication. Please call and speak with him about this. CB # [phone]

## 2014-07-25 ENCOUNTER — Encounter: Payer: Self-pay | Admitting: Family Medicine

## 2014-07-27 ENCOUNTER — Encounter: Payer: Self-pay | Admitting: Internal Medicine

## 2014-08-15 ENCOUNTER — Other Ambulatory Visit: Payer: Self-pay | Admitting: Physician Assistant

## 2014-08-16 NOTE — Telephone Encounter (Signed)
Can we RF?

## 2015-01-03 ENCOUNTER — Ambulatory Visit (INDEPENDENT_AMBULATORY_CARE_PROVIDER_SITE_OTHER): Payer: 59 | Admitting: Family Medicine

## 2015-01-03 ENCOUNTER — Ambulatory Visit (INDEPENDENT_AMBULATORY_CARE_PROVIDER_SITE_OTHER): Payer: 59

## 2015-01-03 VITALS — BP 112/68 | HR 89 | Temp 98.5°F | Resp 17 | Ht 72.0 in | Wt 188.0 lb

## 2015-01-03 DIAGNOSIS — M25561 Pain in right knee: Secondary | ICD-10-CM | POA: Diagnosis not present

## 2015-01-03 DIAGNOSIS — R202 Paresthesia of skin: Secondary | ICD-10-CM | POA: Diagnosis not present

## 2015-01-03 MED ORDER — DICLOFENAC SODIUM 75 MG PO TBEC: 75.0000 mg | DELAYED_RELEASE_TABLET | Freq: Two times a day (BID) | ORAL | Status: DC

## 2015-01-03 NOTE — Progress Notes (Signed)
Subjective: Patient is here for a couple of things. He is hurting in the lateral aspect of his right knee. No specific injury. He works at [employer] and runs around [employer] quite a lot. That gives him plenty of exercise. He knows of no injury. It's been bothering him for about a month. It doesn't hurt counseling, but when he moves certain positions it isn't pain. It is in the lateral aspect of the knee along the joint line. It concerned him because this same area, just proximal to it, is a 4 inch diameter area of paresthesia. This is been numb over the last year. He spoke with emergency room doctor last August when he was in the ER who just reassured him about that. He's tried not to worry about it until now, but with him hurting in his knee he is worried that the 2 might be related also. It is not gotten worse and he has not had paresthesia anywhere else on the body.  Objective: Subjective area of decreased sensation as noted above just above the lateral aspect of his right knee. The area of knee pain is right along the joint line lateral to the patella of the right knee. Varus and valgus motion and drawer signs are normal. No effusion. He can walk fine.  Assessment: Right knee pain Paresthesia right lower thigh  X-ray right knee Explained to him that since this continues to bother him and stay numb we can send him to a neurologist and get their opinion. I do not know anything differently I can offer for it.  [facility] reading (PRIMARY) by  Dr. Linna Darner Normal knee.   Referral will be made to a neurologist for evaluation of the numbness in the right leg    Take the diclofenac one twice daily for pain and inflammation of knee.  If not substantially improved over the next couple of weeks, or if worse at anytime, I'll be happy to refer you on to [place] if you contact us.   Return at anytime if needed.

## 2015-01-03 NOTE — Patient Instructions (Signed)
Referral will be made to a neurologist for evaluation of the numbness in the right leg    Take the diclofenac one twice daily for pain and inflammation of knee.  If not substantially improved over the next couple of weeks, or if worse at anytime, I'll be happy to refer you on to [place] if you contact us.   Return at anytime if needed.

## 2015-02-09 ENCOUNTER — Other Ambulatory Visit: Payer: Self-pay | Admitting: Internal Medicine

## 2015-02-09 NOTE — Telephone Encounter (Signed)
PATIENT STATES WHEN HE WAS IN THE OFFICE TO SEE DR. HOPPER IN July, HE FORGOT TO TELL HIM THAT HE WAS ALMOST OUT OF HIS BLOOD PRESSURE MEDICATION. HE TAKES LISINOPRIL 10-12.5 MG. BEST PHONE [phone] (CELL) PHARMACY CHOICE IS [facility] [address]. [place]

## 2015-02-10 ENCOUNTER — Telehealth: Payer: Self-pay | Admitting: *Deleted

## 2015-02-10 NOTE — Telephone Encounter (Signed)
Dr. Linna Darner I gave him 30 day supply, do you need him to come back in or give him more refills?

## 2015-02-11 NOTE — Telephone Encounter (Signed)
I spoke to pt and he stated that he forgot to ask Dr Linna Darner for RFs of lisinopril, but since he was just here in July and his blood pressure was great, he doesn't feel that he should have to come back in again so soon to have it checked again. Dr Linna Darner, please advise if we can give him any more refills or if you still want him to [facility] first.

## 2015-02-11 NOTE — Telephone Encounter (Signed)
Have him return before further refills.

## 2015-02-18 NOTE — Telephone Encounter (Signed)
Okay to refill for 3 months. However note that when he came in it was not anything to do with his blood pressure. He did not complain of it. We did not do cardiovascular discussion or assessment. He is to come back by November or so.

## 2015-02-19 MED ORDER — LISINOPRIL-HYDROCHLOROTHIAZIDE 10-12.5 MG PO TABS: ORAL_TABLET | ORAL | Status: DC

## 2015-02-19 NOTE — Telephone Encounter (Signed)
Rx sent. Pt notified, he will come in to see Dr. Linna Darner in November.

## 2015-05-31 ENCOUNTER — Telehealth: Payer: Self-pay

## 2015-05-31 NOTE — Telephone Encounter (Signed)
Call: Needs rechecked to continue giving refills.

## 2015-05-31 NOTE — Telephone Encounter (Signed)
Donald Boyer, MD at 02/18/2015 9:38 PM     Status: Signed       Expand All Collapse All   Okay to refill for 3 months. However note that when he came in it was not anything to do with his blood pressure. He did not complain of it. We did not do cardiovascular discussion or assessment. He is to come back by November or so.

## 2015-05-31 NOTE — Telephone Encounter (Signed)
Left message to RTC.

## 2015-05-31 NOTE — Telephone Encounter (Signed)
Pt is wanting to know if he could get his bp medication refill and would documentation from his psychiatrist bp numbers of 112/78 work instead of him coming in   [place] number [phone]

## 2015-06-06 ENCOUNTER — Ambulatory Visit (INDEPENDENT_AMBULATORY_CARE_PROVIDER_SITE_OTHER): Payer: 59 | Admitting: Emergency Medicine

## 2015-06-06 ENCOUNTER — Other Ambulatory Visit: Payer: Self-pay | Admitting: Emergency Medicine

## 2015-06-06 ENCOUNTER — Telehealth: Payer: Self-pay

## 2015-06-06 VITALS — BP 130/58 | HR 70 | Temp 98.2°F | Resp 14 | Ht 72.0 in | Wt 176.8 lb

## 2015-06-06 DIAGNOSIS — I10 Essential (primary) hypertension: Secondary | ICD-10-CM | POA: Insufficient documentation

## 2015-06-06 DIAGNOSIS — J302 Other seasonal allergic rhinitis: Secondary | ICD-10-CM | POA: Diagnosis not present

## 2015-06-06 LAB — LIPID PANEL
CHOL/HDL RATIO: 2 ratio (ref <=5.0)
Cholesterol: 128 mg/dL (ref 125–200)
HDL: 65 mg/dL (ref >=40)
LDL CALC: 57 mg/dL (ref <130)
Triglycerides: 32 mg/dL (ref <150)
VLDL: 6 mg/dL (ref <30)

## 2015-06-06 LAB — POCT URINALYSIS DIP (MANUAL ENTRY)
Blood, UA: NEGATIVE
Glucose, UA: NEGATIVE
LEUKOCYTES UA: NEGATIVE
Nitrite, UA: NEGATIVE
PH UA: 5.5
Urobilinogen, UA: 0.2

## 2015-06-06 LAB — COMPREHENSIVE METABOLIC PANEL
ALT: 13 U/L (ref 9–46)
AST: 17 U/L (ref 10–40)
Albumin: 4.7 g/dL (ref 3.6–5.1)
Alkaline Phosphatase: 57 U/L (ref 40–115)
BUN: 16 mg/dL (ref 7–25)
CO2: 26 mmol/L (ref 20–31)
CREATININE: 0.77 mg/dL (ref 0.60–1.35)
Calcium: 9.4 mg/dL (ref 8.6–10.3)
Chloride: 105 mmol/L (ref 98–110)
GLUCOSE: 81 mg/dL (ref 65–99)
Potassium: 4.3 mmol/L (ref 3.5–5.3)
SODIUM: 142 mmol/L (ref 135–146)
Total Bilirubin: 0.5 mg/dL (ref 0.2–1.2)
Total Protein: 6.7 g/dL (ref 6.1–8.1)

## 2015-06-06 LAB — CBC
HCT: 38.5 % — ABNORMAL LOW (ref 39.0–52.0)
HEMOGLOBIN: 12.9 g/dL — AB (ref 13.0–17.0)
MCH: 29.6 pg (ref 26.0–34.0)
MCHC: 33.5 g/dL (ref 30.0–36.0)
MCV: 88.3 fL (ref 78.0–100.0)
MPV: 10.7 fL (ref 8.6–12.4)
PLATELETS: 221 10*3/uL (ref 150–400)
RBC: 4.36 MIL/uL (ref 4.22–5.81)
RDW: 12.5 % (ref 11.5–15.5)
WBC: 6.2 10*3/uL (ref 4.0–10.5)

## 2015-06-06 LAB — POC MICROSCOPIC URINALYSIS (UMFC): Mucus: ABSENT

## 2015-06-06 LAB — HEMOGLOBIN A1C: Hgb A1c MFr Bld: 5.4 % (ref 4.0–6.0)

## 2015-06-06 LAB — POCT GLYCOSYLATED HEMOGLOBIN (HGB A1C): HEMOGLOBIN A1C: 5.4

## 2015-06-06 LAB — GLUCOSE, POCT (MANUAL RESULT ENTRY): POC GLUCOSE: 95 mg/dL (ref 70–99)

## 2015-06-06 MED ORDER — MONTELUKAST SODIUM 10 MG PO TABS
10.0000 mg | ORAL_TABLET | Freq: Every day | ORAL | Status: DC
Start: 2015-06-06 — End: 2016-04-01

## 2015-06-06 MED ORDER — LISINOPRIL-HYDROCHLOROTHIAZIDE 10-12.5 MG PO TABS: ORAL_TABLET | ORAL | Status: DC

## 2015-06-06 NOTE — Patient Instructions (Signed)

## 2015-06-06 NOTE — Telephone Encounter (Signed)
See previous message

## 2015-06-06 NOTE — Progress Notes (Signed)
Subjective:  Patient ID: Donald Jenkins, male    DOB: 08-19-1970  Age: 44 y.o. MRN: [id number]  CC: Medication Refill   HPI DARIC PASLAY presents  for refill on her blood pressure medicine. His medicine for about a month. He denies any adverse effect. Is no evidence of an endorgan injury no acute complaint.  He has a history of seasonal allergic rhinitis and has used over-the-counter steroid sprays with no improvement and they fail to work and he is asking for something more for his allergies  History Lonzo has a past medical history of Tachycardia and Anxiety.   He has no past surgical history on file.   His  family history is not on file.  He   reports that he has never smoked. He does not have any smokeless tobacco history on file. He reports that he does not drink alcohol or use illicit drugs.  Outpatient Prescriptions Prior to Visit  Medication Sig Dispense Refill  . ALPRAZolam (XANAX XR) 1 MG 24 hr tablet Take 1 mg by mouth daily.    Marland Kitchen lisinopril-hydrochlorothiazide (PRINZIDE,ZESTORETIC) 10-12.5 MG per tablet TAKE ONE TABLET BY MOUTH ONCE DAILY 30 tablet 2  . magnesium oxide (MAG-OX) 400 MG tablet Take 800 mg by mouth daily.    Marland Kitchen POTASSIUM PO Take 1 tablet by mouth daily. Over the counter    . zolpidem (AMBIEN) 5 MG tablet Take 10 mg by mouth at bedtime as needed for sleep.    Marland Kitchen lamoTRIgine (LAMICTAL) 150 MG tablet Take 150 mg by mouth daily.    . Coenzyme Q10 (COQ-10 PO) Take 1 tablet by mouth daily.    . diclofenac (VOLTAREN) 75 MG EC tablet Take 1 tablet (75 mg total) by mouth 2 (two) times daily. (Patient not taking: Reported on 06/06/2015) 30 tablet 0  . divalproex (DEPAKOTE) 500 MG DR tablet Take 500 mg by mouth 6 (six) times daily.    . Omega-3 Fatty Acids (FISH OIL PO) Take 1 capsule by mouth daily. Over the counter    . sildenafil (REVATIO) 20 MG tablet TAKE ONE TABLET BY MOUTH AS NEEDED (TAKE 1-2 TABLETS BY MOUTH PRIOR TO INTERCOURSE). (Patient not  taking: Reported on 01/03/2015) 10 tablet 10   No facility-administered medications prior to visit.    Social History   Social History  . Marital Status: Single    Spouse Name: N/A  . Number of Children: N/A  . Years of Education: N/A   Social History Main Topics  . Smoking status: Never Smoker   . Smokeless tobacco: None  . Alcohol Use: No  . Drug Use: No  . Sexual Activity: Not Asked   Other Topics Concern  . None   Social History Narrative     Review of Systems  Constitutional: Negative for fever, chills and appetite change.  HENT: Positive for postnasal drip and rhinorrhea. Negative for congestion, ear pain, sinus pressure and sore throat.   Eyes: Negative for pain and redness.  Respiratory: Negative for cough, shortness of breath and wheezing.   Cardiovascular: Negative for leg swelling.  Gastrointestinal: Negative for nausea, vomiting, abdominal pain, diarrhea, constipation and blood in stool.  Endocrine: Negative for polyuria.  Genitourinary: Negative for dysuria, urgency, frequency and flank pain.  Musculoskeletal: Negative for gait problem.  Skin: Negative for rash.  Neurological: Negative for weakness and headaches.  Psychiatric/Behavioral: Negative for confusion and decreased concentration. The patient is not nervous/anxious.     Objective:  BP 130/58 mmHg  Pulse  70  Temp(Src) 98.2 F (36.8 C) (Oral)  Resp 14  Ht 6' (1.829 m)  Wt 176 lb 12.8 oz (80.196 kg)  BMI 23.97 kg/m2  SpO2 99%  Physical Exam  Constitutional: He is oriented to person, place, and time. He appears well-developed and well-nourished. No distress.  HENT:  Head: Normocephalic and atraumatic.  Right Ear: External ear normal.  Left Ear: External ear normal.  Nose: Nose normal.  Eyes: Conjunctivae and EOM are normal. Pupils are equal, round, and reactive to light. No scleral icterus.  Neck: Normal range of motion. Neck supple. No tracheal deviation present.  Cardiovascular: Normal  rate, regular rhythm and normal heart sounds.   Pulmonary/Chest: Effort normal. No respiratory distress. He has no wheezes. He has no rales.  Abdominal: He exhibits no mass. There is no tenderness. There is no rebound and no guarding.  Musculoskeletal: He exhibits no edema.  Lymphadenopathy:    He has no cervical adenopathy.  Neurological: He is alert and oriented to person, place, and time. Coordination normal.  Skin: Skin is warm and dry. No rash noted.  Psychiatric: He has a normal mood and affect. His behavior is normal.      Assessment & Plan:   Zakariyya was seen today for medication refill.  Diagnoses and all orders for this visit:  Essential hypertension, benign -     POCT glucose (manual entry) -     POCT glycosylated hemoglobin (Hb A1C) -     CBC -     Comprehensive metabolic panel -     Lipid panel -     POCT urinalysis dipstick -     POCT Microscopic Urinalysis ([facility])  Seasonal allergic rhinitis -     POCT glucose (manual entry) -     POCT glycosylated hemoglobin (Hb A1C) -     CBC -     Comprehensive metabolic panel -     Lipid panel -     POCT urinalysis dipstick -     POCT Microscopic Urinalysis ([facility]) -     Ambulatory referral to Allergy  Other orders -     montelukast (SINGULAIR) 10 MG tablet; Take 1 tablet (10 mg total) by mouth at bedtime.   I am having Mr. Hipp start on montelukast. I am also having him maintain his magnesium oxide, Coenzyme Q10 (COQ-10 PO), POTASSIUM PO, Omega-3 Fatty Acids (FISH OIL PO), sildenafil, zolpidem, divalproex, ALPRAZolam, diclofenac, lisinopril-hydrochlorothiazide, lamoTRIgine, and Mirtazapine (REMERON PO).  Meds ordered this encounter  Medications  . lamoTRIgine (LAMICTAL) 100 MG tablet    Sig: Take 300 mg by mouth daily.  . Mirtazapine (REMERON PO)    Sig: Take by mouth.  . montelukast (SINGULAIR) 10 MG tablet    Sig: Take 1 tablet (10 mg total) by mouth at bedtime.    Dispense:  90 tablet    Refill:  3     Appropriate red flag conditions were discussed with the patient as well as actions that should be taken.  Patient expressed his understanding.  Follow-up: Return if symptoms worsen or fail to improve.  Roselee Culver, MD

## 2015-06-06 NOTE — Telephone Encounter (Signed)
Patient was seen today by Dr. Ouida Sills. the script requested by patient for lisinopril-hydrochlorothiazide (PRINZIDE,ZESTORETIC) 10-12.5 MG per tablet is not at the pharmacy.  [address] [place], [place]

## 2015-06-13 ENCOUNTER — Encounter: Payer: Self-pay | Admitting: Family Medicine

## 2015-08-07 ENCOUNTER — Ambulatory Visit (INDEPENDENT_AMBULATORY_CARE_PROVIDER_SITE_OTHER): Payer: BLUE CROSS/BLUE SHIELD | Admitting: Family Medicine

## 2015-08-07 VITALS — BP 138/76 | HR 99 | Temp 98.0°F | Resp 16 | Ht 72.0 in | Wt 179.0 lb

## 2015-08-07 DIAGNOSIS — S1086XA Insect bite of other specified part of neck, initial encounter: Secondary | ICD-10-CM | POA: Diagnosis not present

## 2015-08-07 DIAGNOSIS — W57XXXA Bitten or stung by nonvenomous insect and other nonvenomous arthropods, initial encounter: Secondary | ICD-10-CM

## 2015-08-07 DIAGNOSIS — J3489 Other specified disorders of nose and nasal sinuses: Secondary | ICD-10-CM

## 2015-08-07 DIAGNOSIS — R0981 Nasal congestion: Secondary | ICD-10-CM

## 2015-08-07 MED ORDER — DOXYCYCLINE HYCLATE 100 MG PO TABS: 100.0000 mg | ORAL_TABLET | Freq: Two times a day (BID) | ORAL | Status: DC

## 2015-08-07 MED ORDER — IPRATROPIUM BROMIDE 0.03 % NA SOLN: 2.0000 | Freq: Four times a day (QID) | NASAL | Status: DC

## 2015-08-07 NOTE — Patient Instructions (Signed)
Try the doxycyline antibiotic as directed for the lesions on your scalp Let me know if these are getting worse or if they are not better in the next week or so Try the atrovent nasal as needed for runny nose

## 2015-08-07 NOTE — Progress Notes (Signed)
[facility] [address], [place]  [address] [phone]  Date:  08/07/2015   Name:  Donald Jenkins   DOB:  May 28, 1971   MRN:  [id number]  PCP:  PROVIDER NOT IN SYSTEM    Chief Complaint: bite on back of neck   History of Present Illness:  Donald Jenkins is a 45 y.o. very pleasant male patient who presents with the following:  He has noted a cluster of bumps on the back of his neck for about one week. He does "bic" his head on a regular basis He is using some hydrocortisone cream and neosporin and applying a bandage. The area is a bit itchy. Does not hurt however He has not noted any other spots or bites on his body He otherwise feels well, no fever, etc He did clean out a shed recently and thinks he may have been bitten by a spider- he did not feel any bite but there were a lot of spiders and webs that he saw while he was working  He is the Programme researcher, broadcasting/film/video at [employer] and notes that he constantly has a runny nose from the flowers.  He is taking singulair that helps but he still has rhinorrhea  Patient Active Problem List   Diagnosis Date Noted  . Essential hypertension, benign 06/06/2015    Past Medical History  Diagnosis Date  . Tachycardia   . Anxiety     History reviewed. No pertinent past surgical history.  Social History  Substance Use Topics  . Smoking status: Never Smoker   . Smokeless tobacco: Never Used  . Alcohol Use: No    History reviewed. No pertinent family history.  Allergies  Allergen Reactions  . Penicillins Other (See Comments)    From childhood    Medication list has been reviewed and updated.  Current Outpatient Prescriptions on File Prior to Visit  Medication Sig Dispense Refill  . ALPRAZolam (XANAX XR) 1 MG 24 hr tablet Take 1 mg by mouth daily.    Marland Kitchen lamoTRIgine (LAMICTAL) 100 MG tablet Take 300 mg by mouth daily.    Marland Kitchen lisinopril-hydrochlorothiazide (PRINZIDE,ZESTORETIC) 10-12.5 MG tablet TAKE ONE TABLET BY  MOUTH ONCE DAILY 30 tablet 5  . Mirtazapine (REMERON PO) Take by mouth.    . montelukast (SINGULAIR) 10 MG tablet Take 1 tablet (10 mg total) by mouth at bedtime. 90 tablet 3  . POTASSIUM PO Take 1 tablet by mouth daily. Over the counter    . [DISCONTINUED] sildenafil (VIAGRA) 50 MG tablet Take 1 tablet (50 mg total) by mouth daily as needed for erectile dysfunction. 10 tablet 0   No current facility-administered medications on file prior to visit.    Review of Systems:  As per HPI- otherwise negative.   Physical Examination: Filed Vitals:   08/07/15 1433  BP: 138/76  Pulse: 99  Temp: 98 F (36.7 C)  Resp: 16   Filed Vitals:   08/07/15 1433  Height: 6' (1.829 m)  Weight: 179 lb (81.194 kg)   Body mass index is 24.27 kg/(m^2). Ideal Body Weight: Weight in (lb) to have BMI = 25: 183.9  GEN: WDWN, NAD, Non-toxic, A & O x 3, looks well HEENT: Atraumatic, Normocephalic. Neck supple. No masses, No LAD.  Bilateral TM wnl, oropharynx normal.  PEERL,EOMI.   Ears and Nose: No external deformity. CV: RRR, No M/G/R. No JVD. No thrill. No extra heart sounds. PULM: CTA B, no wheezes, crackles, rhonchi. No retractions. No resp. distress.  No accessory muscle use.Marland Kitchen EXTR: No c/c/e NEURO Normal gait.  PSYCH: Normally interactive. Conversant. Not depressed or anxious appearing.  Calm demeanor.   He has a grouped cluster of small bites on the nape of the neck, mostly on the right side.  None appear to be especially infected, no fluctuance  Assessment and Plan: Insect bites - Plan: doxycycline (VIBRA-TABS) 100 MG tablet  Nasal congestion with rhinorrhea - Plan: ipratropium (ATROVENT) 0.03 % nasal spray he would like to try abx as he is self- conscious about the appearance of the bite Will rx doxycycline for him and he will let me know if not better soon Try atrovent nasal for his rhinorrhea    Signed Lamar Blinks, MD

## 2015-11-26 ENCOUNTER — Telehealth: Payer: Self-pay

## 2015-11-26 NOTE — Telephone Encounter (Signed)
Patient is requesting a refill for lisinopril Pharmacy is [facility] [address]

## 2015-11-28 MED ORDER — LISINOPRIL-HYDROCHLOROTHIAZIDE 10-12.5 MG PO TABS: ORAL_TABLET | ORAL | Status: DC

## 2015-11-28 NOTE — Telephone Encounter (Signed)
Notified pt that RF was sent and that he is due for BP check up this month. Pt didn't understand why f/up is needed since he was in for spider bite a few months ago and his BP was fine. Explained that normally providers will check CMP in order to check kidney function when a pt is on BP meds. Pt voiced understanding.

## 2015-11-28 NOTE — Telephone Encounter (Signed)
Pt checking on status of this message. He has been out of this script  for 3 days. Please advise at [phone]

## 2015-12-26 ENCOUNTER — Other Ambulatory Visit: Payer: Self-pay | Admitting: Urgent Care

## 2016-01-14 ENCOUNTER — Ambulatory Visit (INDEPENDENT_AMBULATORY_CARE_PROVIDER_SITE_OTHER): Payer: BLUE CROSS/BLUE SHIELD | Admitting: Urgent Care

## 2016-01-14 ENCOUNTER — Encounter: Payer: Self-pay | Admitting: Urgent Care

## 2016-01-14 VITALS — BP 128/80 | HR 76 | Temp 99.1°F | Resp 17 | Ht 72.5 in | Wt 192.0 lb

## 2016-01-14 DIAGNOSIS — Z9109 Other allergy status, other than to drugs and biological substances: Secondary | ICD-10-CM | POA: Diagnosis not present

## 2016-01-14 DIAGNOSIS — I10 Essential (primary) hypertension: Secondary | ICD-10-CM | POA: Diagnosis not present

## 2016-01-14 DIAGNOSIS — Z889 Allergy status to unspecified drugs, medicaments and biological substances status: Secondary | ICD-10-CM

## 2016-01-14 LAB — COMPLETE METABOLIC PANEL WITH GFR
ALT: 17 U/L (ref 9–46)
AST: 23 U/L (ref 10–40)
Albumin: 4.2 g/dL (ref 3.6–5.1)
Alkaline Phosphatase: 54 U/L (ref 40–115)
BILIRUBIN TOTAL: 0.4 mg/dL (ref 0.2–1.2)
BUN: 14 mg/dL (ref 7–25)
CO2: 27 mmol/L (ref 20–31)
Calcium: 9.1 mg/dL (ref 8.6–10.3)
Chloride: 104 mmol/L (ref 98–110)
Creat: 1.05 mg/dL (ref 0.60–1.35)
GFR, EST NON AFRICAN AMERICAN: 86 mL/min (ref >=60)
Glucose, Bld: 94 mg/dL (ref 65–99)
POTASSIUM: 4.4 mmol/L (ref 3.5–5.3)
SODIUM: 141 mmol/L (ref 135–146)
TOTAL PROTEIN: 6.6 g/dL (ref 6.1–8.1)

## 2016-01-14 MED ORDER — LISINOPRIL-HYDROCHLOROTHIAZIDE 10-12.5 MG PO TABS: ORAL_TABLET | ORAL | Status: DC

## 2016-01-14 MED ORDER — CETIRIZINE HCL 10 MG PO TABS: 10.0000 mg | ORAL_TABLET | Freq: Every day | ORAL | Status: DC

## 2016-01-14 NOTE — Progress Notes (Signed)
    MRN: [id number] DOB: Sep 22, 1970  Subjective:   Donald Jenkins is a 45 y.o. male presenting for follow up on Hypertension and Allergies.   HTN - Currently managed with lis-HCTZ, well controlled. He has actually been out of his BP meds for the past month. Patient is checking blood pressure at home, generally 99991111 systolic. Does not avoid salt in diet, is not exercising. Stays active with work. Denies lightheadedness, dizziness, chronic headache, blurred vision, chest pain, shortness of breath, heart racing, palpitations, nausea, vomiting, abdominal pain, hematuria, lower leg swelling. Denies smoking cigarettes.   Allergies - Reports longstanding history of allergies. Has used nasal steroids, antihistamines in the past. Currently, he uses Atrovent and Singulair at night when his nasal congestion is at its worst. He would like to use another medication for his allergies. Has not seen an allergist before.   Donald Jenkins has a current medication list which includes the following prescription(s): alprazolam, ipratropium, lamotrigine, lisinopril-hydrochlorothiazide, mirtazapine, montelukast, potassium, and zaleplon. Also is allergic to penicillins.  Donald Jenkins  has a past medical history of Tachycardia and Anxiety. Also  has no past surgical history on file.  Objective:   Vitals: BP 128/80 mmHg  Pulse 76  Temp(Src) 99.1 F (37.3 C) (Oral)  Resp 17  Ht 6' 0.5" (1.842 m)  Wt 192 lb (87.091 kg)  BMI 25.67 kg/m2  SpO2 99%  BP Readings from Last 3 Encounters:  01/14/16 128/80  08/07/15 138/76  06/06/15 130/58   Wt Readings from Last 3 Encounters:  01/14/16 192 lb (87.091 kg)  08/07/15 179 lb (81.194 kg)  06/06/15 176 lb 12.8 oz (80.196 kg)   Physical Exam  Constitutional: He is oriented to person, place, and time. He appears well-developed and well-nourished.  HENT:  TM's intact bilaterally, no effusions or erythema. Nasal turbinates pink and moist, nasal passages patent. No  sinus tenderness. Oropharynx clear, mucous membranes moist, dentition in good repair.  Eyes: Pupils are equal, round, and reactive to light. No scleral icterus.  Neck: Normal range of motion. Neck supple. No thyromegaly present.  Cardiovascular: Normal rate, regular rhythm and intact distal pulses.  Exam reveals no gallop and no friction rub.   No murmur heard. Pulmonary/Chest: No respiratory distress. He has no wheezes. He has no rales.  Musculoskeletal: He exhibits no edema.  Neurological: He is alert and oriented to person, place, and time.  Skin: Skin is warm and dry.   Assessment and Plan :   1. Essential hypertension - Stable, continue lis-HCTZ. Refilled medication. Labs pending, f/u in 6 months.  2. Multiple allergies - Add Zyrtec, restart nasal steroid, continue Singulair. Consider injection of Depo-Medrol if his allergies persist. Also may need referral to [place], PA-C [facility] [phone] 01/14/2016 8:34 AM

## 2016-01-14 NOTE — Patient Instructions (Addendum)
Hypertension Hypertension, commonly called high blood pressure, is when the force of blood pumping through your arteries is too strong. Your arteries are the blood vessels that carry blood from your heart throughout your body. A blood pressure reading consists of a higher number over a lower number, such as 110/72. The higher number (systolic) is the pressure inside your arteries when your heart pumps. The lower number (diastolic) is the pressure inside your arteries when your heart relaxes. Ideally you want your blood pressure below 120/80. Hypertension forces your heart to work harder to pump blood. Your arteries may become narrow or stiff. Having untreated or uncontrolled hypertension can cause heart attack, stroke, kidney disease, and other problems. RISK FACTORS Some risk factors for high blood pressure are controllable. Others are not.  Risk factors you cannot control include:   Race. You may be at higher risk if you are African American.  Age. Risk increases with age.  Gender. Men are at higher risk than women before age 45 years. After age 65, women are at higher risk than men. Risk factors you can control include:  Not getting enough exercise or physical activity.  Being overweight.  Getting too much fat, sugar, calories, or salt in your diet.  Drinking too much alcohol. SIGNS AND SYMPTOMS Hypertension does not usually cause signs or symptoms. Extremely high blood pressure (hypertensive crisis) may cause headache, anxiety, shortness of breath, and nosebleed. DIAGNOSIS To check if you have hypertension, your health care provider will measure your blood pressure while you are seated, with your arm held at the level of your heart. It should be measured at least twice using the same arm. Certain conditions can cause a difference in blood pressure between your right and left arms. A blood pressure reading that is higher than normal on one occasion does not mean that you need treatment. If  it is not clear whether you have high blood pressure, you may be asked to return on a different day to have your blood pressure checked again. Or, you may be asked to monitor your blood pressure at home for 1 or more weeks. TREATMENT Treating high blood pressure includes making lifestyle changes and possibly taking medicine. Living a healthy lifestyle can help lower high blood pressure. You may need to change some of your habits. Lifestyle changes may include:  Following the DASH diet. This diet is high in fruits, vegetables, and whole grains. It is low in salt, red meat, and added sugars.  Keep your sodium intake below 2,300 mg per day.  Getting at least 30-45 minutes of aerobic exercise at least 4 times per week.  Losing weight if necessary.  Not smoking.  Limiting alcoholic beverages.  Learning ways to reduce stress. Your health care provider may prescribe medicine if lifestyle changes are not enough to get your blood pressure under control, and if one of the following is true:  You are 18-59 years of age and your systolic blood pressure is above 140.  You are 60 years of age or older, and your systolic blood pressure is above 150.  Your diastolic blood pressure is above 90.  You have diabetes, and your systolic blood pressure is over 140 or your diastolic blood pressure is over 90.  You have kidney disease and your blood pressure is above 140/90.  You have heart disease and your blood pressure is above 140/90. Your personal target blood pressure may vary depending on your medical conditions, your age, and other factors. HOME CARE INSTRUCTIONS    Have your blood pressure rechecked as directed by your health care provider.   Take medicines only as directed by your health care provider. Follow the directions carefully. Blood pressure medicines must be taken as prescribed. The medicine does not work as well when you skip doses. Skipping doses also puts you at risk for  problems.  Do not smoke.   Monitor your blood pressure at home as directed by your health care provider. SEEK MEDICAL CARE IF:   You think you are having a reaction to medicines taken.  You have recurrent headaches or feel dizzy.  You have swelling in your ankles.  You have trouble with your vision. SEEK IMMEDIATE MEDICAL CARE IF:  You develop a severe headache or confusion.  You have unusual weakness, numbness, or feel faint.  You have severe chest or abdominal pain.  You vomit repeatedly.  You have trouble breathing. MAKE SURE YOU:   Understand these instructions.  Will watch your condition.  Will get help right away if you are not doing well or get worse.   This information is not intended to replace advice given to you by your health care provider. Make sure you discuss any questions you have with your health care provider.   Document Released: 06/15/2005 Document Revised: 10/30/2014 Document Reviewed: 04/07/2013 Elsevier Interactive Patient Education 2016 [employer] An allergy is an abnormal reaction to a substance by the body's defense system (immune system). Allergies can develop at any age. WHAT CAUSES ALLERGIES? An allergic reaction happens when the immune system mistakenly reacts to a normally harmless substance, called an allergen, as if it were harmful. The immune system releases antibodies to fight the substance. Antibodies eventually release a chemical called histamine into the bloodstream. The release of histamine is meant to protect the body from infection, but it also causes discomfort. An allergic reaction can be triggered by:  Eating an allergen.  Inhaling an allergen.  Touching an allergen. WHAT TYPES OF ALLERGIES ARE THERE? There are many types of allergies. Common types include:  Seasonal allergies. People with this type of allergy are usually allergic to substances that are only present during certain seasons, such as  molds and pollens.  Food allergies.  Drug allergies.  Insect allergies.  Animal dander allergies. WHAT ARE SYMPTOMS OF ALLERGIES? Possible allergy symptoms include:  Swelling of the lips, face, tongue, mouth, or throat.  Sneezing, coughing, or wheezing.  Nasal congestion.  Tingling in the mouth.  Rash.  Itching.  Itchy, red, swollen areas of skin (hives).  Watery eyes.  Vomiting.  Diarrhea.  Dizziness.  Lightheadedness.  Fainting.  Trouble breathing or swallowing.  Chest tightness.  Rapid heartbeat. HOW ARE ALLERGIES DIAGNOSED? Allergies are diagnosed with a medical and family history and one or more of the following:  Skin tests.  Blood tests.  A food diary. A food diary is a record of all the foods and drinks you have in a day and of all the symptoms you experience.  The results of an elimination diet. An elimination diet involves eliminating foods from your diet and then adding them back in one by one to find out if a certain food causes an allergic reaction. HOW ARE ALLERGIES TREATED? There is no cure for allergies, but allergic reactions can be treated with medicine. Severe reactions usually need to be treated at a hospital. HOW CAN REACTIONS BE PREVENTED? The best way to prevent an allergic reaction is by avoiding the substance you are allergic to.  Allergy shots and medicines can also help prevent reactions in some cases. People with severe allergic reactions may be able to prevent a life-threatening reaction called anaphylaxis with a medicine given right after exposure to the allergen.   This information is not intended to replace advice given to you by your health care provider. Make sure you discuss any questions you have with your health care provider.   Document Released: 09/08/2002 Document Revised: 07/06/2014 Document Reviewed: 03/27/2014 Elsevier Interactive Patient Education [employer].

## 2016-01-15 LAB — MICROALBUMIN, URINE: Microalb, Ur: 0.3 mg/dL

## 2016-04-01 ENCOUNTER — Other Ambulatory Visit: Payer: Self-pay

## 2016-04-01 MED ORDER — MONTELUKAST SODIUM 10 MG PO TABS: 10.0000 mg | ORAL_TABLET | Freq: Every day | ORAL | 2 refills | Status: DC

## 2016-12-25 ENCOUNTER — Other Ambulatory Visit: Payer: Self-pay | Admitting: Urgent Care

## 2016-12-25 DIAGNOSIS — I10 Essential (primary) hypertension: Secondary | ICD-10-CM

## 2016-12-28 NOTE — Telephone Encounter (Signed)
mychart message sent to pt about making an appt °

## 2017-01-28 ENCOUNTER — Other Ambulatory Visit: Payer: Self-pay

## 2017-01-28 DIAGNOSIS — I10 Essential (primary) hypertension: Secondary | ICD-10-CM

## 2017-01-28 MED ORDER — LISINOPRIL-HYDROCHLOROTHIAZIDE 10-12.5 MG PO TABS: 1.0000 | ORAL_TABLET | Freq: Every day | ORAL | 3 refills | Status: DC

## 2017-01-29 ENCOUNTER — Other Ambulatory Visit: Payer: Self-pay | Admitting: Urgent Care

## 2017-01-29 DIAGNOSIS — I10 Essential (primary) hypertension: Secondary | ICD-10-CM

## 2017-06-02 ENCOUNTER — Other Ambulatory Visit: Payer: Self-pay | Admitting: Urgent Care

## 2017-06-02 ENCOUNTER — Telehealth: Payer: Self-pay | Admitting: Urgent Care

## 2017-06-02 DIAGNOSIS — I10 Essential (primary) hypertension: Secondary | ICD-10-CM

## 2017-06-02 MED ORDER — LISINOPRIL-HYDROCHLOROTHIAZIDE 10-12.5 MG PO TABS: 1.0000 | ORAL_TABLET | Freq: Every day | ORAL | 0 refills | Status: DC

## 2017-06-02 NOTE — Telephone Encounter (Signed)
Pt notified   Of appt  Made  And     Medication  Refill     Pt  Advised  This  Was  A  Onetime  Refill  And  He  Needed to   Keep  The  Appointment

## 2017-06-02 NOTE — Telephone Encounter (Signed)
Pt  Called  Requesting  A  Refill  Of   Lisinopril   - hydrochlorathiazide     New  Appointment made  Per  protocall      30  Day  Supply  Ordered   Pt  Advised  He  Would  Need  An  Additional  Visit  For  Any  more  Refill

## 2017-06-16 ENCOUNTER — Other Ambulatory Visit: Payer: Self-pay | Admitting: Urgent Care

## 2017-06-16 DIAGNOSIS — I10 Essential (primary) hypertension: Secondary | ICD-10-CM

## 2017-06-16 NOTE — Telephone Encounter (Signed)
On 06/02/17 he was given a refill to last until his OV in Jan.  2019

## 2017-06-17 ENCOUNTER — Telehealth: Payer: Self-pay | Admitting: *Deleted

## 2017-06-17 NOTE — Telephone Encounter (Signed)
I didn't see anything on why this patient was called.

## 2017-06-17 NOTE — Telephone Encounter (Signed)
Copied from [place] [id number]. Topic: General - Call Back - No Documentation >> Jun 16, 2017  5:14 PM Marin Olp L wrote: Reason for CRM: Patient returning missed call from [place].

## 2017-07-01 ENCOUNTER — Ambulatory Visit: Payer: Self-pay | Admitting: Urgent Care

## 2017-07-07 ENCOUNTER — Telehealth: Payer: Self-pay | Admitting: Urgent Care

## 2017-07-07 NOTE — Telephone Encounter (Signed)
Called pt to remind them of their appt in the morning 07/08/17

## 2017-07-08 ENCOUNTER — Ambulatory Visit: Payer: BLUE CROSS/BLUE SHIELD | Admitting: Urgent Care

## 2017-07-08 ENCOUNTER — Encounter: Payer: Self-pay | Admitting: Urgent Care

## 2017-07-08 VITALS — BP 122/78 | HR 76 | Temp 97.9°F | Resp 18 | Ht 72.0 in | Wt 203.0 lb

## 2017-07-08 DIAGNOSIS — Z889 Allergy status to unspecified drugs, medicaments and biological substances status: Secondary | ICD-10-CM

## 2017-07-08 DIAGNOSIS — I10 Essential (primary) hypertension: Secondary | ICD-10-CM | POA: Diagnosis not present

## 2017-07-08 MED ORDER — LISINOPRIL-HYDROCHLOROTHIAZIDE 10-12.5 MG PO TABS
1.0000 | ORAL_TABLET | Freq: Every day | ORAL | 3 refills | Status: DC
Start: 2017-07-08 — End: 2018-09-22

## 2017-07-08 MED ORDER — MONTELUKAST SODIUM 10 MG PO TABS: 10.0000 mg | ORAL_TABLET | Freq: Every day | ORAL | 3 refills | Status: DC

## 2017-07-08 NOTE — Patient Instructions (Addendum)
Hypertension Hypertension, commonly called high blood pressure, is when the force of blood pumping through the arteries is too strong. The arteries are the blood vessels that carry blood from the heart throughout the body. Hypertension forces the heart to work harder to pump blood and may cause arteries to become narrow or stiff. Having untreated or uncontrolled hypertension can cause heart attacks, strokes, kidney disease, and other problems. A blood pressure reading consists of a higher number over a lower number. Ideally, your blood pressure should be below 120/80. The first ("top") number is called the systolic pressure. It is a measure of the pressure in your arteries as your heart beats. The second ("bottom") number is called the diastolic pressure. It is a measure of the pressure in your arteries as the heart relaxes. What are the causes? The cause of this condition is not known. What increases the risk? Some risk factors for high blood pressure are under your control. Others are not. Factors you can change  Smoking.  Having type 2 diabetes mellitus, high cholesterol, or both.  Not getting enough exercise or physical activity.  Being overweight.  Having too much fat, sugar, calories, or salt (sodium) in your diet.  Drinking too much alcohol. Factors that are difficult or impossible to change  Having chronic kidney disease.  Having a family history of high blood pressure.  Age. Risk increases with age.  Race. You may be at higher risk if you are African-American.  Gender. Men are at higher risk than women before age 45. After age 65, women are at higher risk than men.  Having obstructive sleep apnea.  Stress. What are the signs or symptoms? Extremely high blood pressure (hypertensive crisis) may cause:  Headache.  Anxiety.  Shortness of breath.  Nosebleed.  Nausea and vomiting.  Severe chest pain.  Jerky movements you cannot control (seizures).  How is this  diagnosed? This condition is diagnosed by measuring your blood pressure while you are seated, with your arm resting on a surface. The cuff of the blood pressure monitor will be placed directly against the skin of your upper arm at the level of your heart. It should be measured at least twice using the same arm. Certain conditions can cause a difference in blood pressure between your right and left arms. Certain factors can cause blood pressure readings to be lower or higher than normal (elevated) for a short period of time:  When your blood pressure is higher when you are in a health care provider's office than when you are at home, this is called white coat hypertension. Most people with this condition do not need medicines.  When your blood pressure is higher at home than when you are in a health care provider's office, this is called masked hypertension. Most people with this condition may need medicines to control blood pressure.  If you have a high blood pressure reading during one visit or you have normal blood pressure with other risk factors:  You may be asked to return on a different day to have your blood pressure checked again.  You may be asked to monitor your blood pressure at home for 1 week or longer.  If you are diagnosed with hypertension, you may have other blood or imaging tests to help your health care provider understand your overall risk for other conditions. How is this treated? This condition is treated by making healthy lifestyle changes, such as eating healthy foods, exercising more, and reducing your alcohol intake. Your   health care provider may prescribe medicine if lifestyle changes are not enough to get your blood pressure under control, and if:  Your systolic blood pressure is above 130.  Your diastolic blood pressure is above 80.  Your personal target blood pressure may vary depending on your medical conditions, your age, and other factors. Follow these  instructions at home: Eating and drinking  Eat a diet that is high in fiber and potassium, and low in sodium, added sugar, and fat. An example eating plan is called the DASH (Dietary Approaches to Stop Hypertension) diet. To eat this way: ? Eat plenty of fresh fruits and vegetables. Try to fill half of your plate at each meal with fruits and vegetables. ? Eat whole grains, such as whole wheat pasta, brown rice, or whole grain bread. Fill about one quarter of your plate with whole grains. ? Eat or drink low-fat dairy products, such as skim milk or low-fat yogurt. ? Avoid fatty cuts of meat, processed or cured meats, and poultry with skin. Fill about one quarter of your plate with lean proteins, such as fish, chicken without skin, beans, eggs, and tofu. ? Avoid premade and processed foods. These tend to be higher in sodium, added sugar, and fat.  Reduce your daily sodium intake. Most people with hypertension should eat less than 1,500 mg of sodium a day.  Limit alcohol intake to no more than 1 drink a day for nonpregnant women and 2 drinks a day for men. One drink equals 12 oz of beer, 5 oz of wine, or 1 oz of hard liquor. Lifestyle  Work with your health care provider to maintain a healthy body weight or to lose weight. Ask what an ideal weight is for you.  Get at least 30 minutes of exercise that causes your heart to beat faster (aerobic exercise) most days of the week. Activities may include walking, swimming, or biking.  Include exercise to strengthen your muscles (resistance exercise), such as pilates or lifting weights, as part of your weekly exercise routine. Try to do these types of exercises for 30 minutes at least 3 days a week.  Do not use any products that contain nicotine or tobacco, such as cigarettes and e-cigarettes. If you need help quitting, ask your health care provider.  Monitor your blood pressure at home as told by your health care provider.  Keep all follow-up visits as  told by your health care provider. This is important. Medicines  Take over-the-counter and prescription medicines only as told by your health care provider. Follow directions carefully. Blood pressure medicines must be taken as prescribed.  Do not skip doses of blood pressure medicine. Doing this puts you at risk for problems and can make the medicine less effective.  Ask your health care provider about side effects or reactions to medicines that you should watch for. Contact a health care provider if:  You think you are having a reaction to a medicine you are taking.  You have headaches that keep coming back (recurring).  You feel dizzy.  You have swelling in your ankles.  You have trouble with your vision. Get help right away if:  You develop a severe headache or confusion.  You have unusual weakness or numbness.  You feel faint.  You have severe pain in your chest or abdomen.  You vomit repeatedly.  You have trouble breathing. Summary  Hypertension is when the force of blood pumping through your arteries is too strong. If this condition is not   controlled, it may put you at risk for serious complications.  Your personal target blood pressure may vary depending on your medical conditions, your age, and other factors. For most people, a normal blood pressure is less than 120/80.  Hypertension is treated with lifestyle changes, medicines, or a combination of both. Lifestyle changes include weight loss, eating a healthy, low-sodium diet, exercising more, and limiting alcohol. This information is not intended to replace advice given to you by your health care provider. Make sure you discuss any questions you have with your health care provider. Document Released: 06/15/2005 Document Revised: 05/13/2016 Document Reviewed: 05/13/2016 Elsevier Interactive Patient Education  2018 [place], Adult An allergy is when your body's defense system (immune system)  overreacts to an otherwise harmless substance (allergen) that you breathe in or eat or something that touches your skin. When you come into contact with something that you are allergic to, your immune system produces certain proteins (antibodies). These proteins cause cells to release chemicals (histamines) that trigger the symptoms of an allergic reaction. Allergies often affect the nasal passages (allergic rhinitis), eyes (allergic conjunctivitis), skin (atopic dermatitis), and stomach. Allergies can be mild or severe. Allergies cannot spread from person to person (are not contagious). They can develop at any age and may be outgrown. What increases the risk? You may be at greater risk of allergies if other people in your family have allergies. What are the signs or symptoms? Symptoms depend on what type of allergy you have. They may include:  Runny, stuffy nose.  Sneezing.  Itchy mouth, ears, or throat.  Postnasal drip.  Sore throat.  Itchy, red, watery, or puffy eyes.  Skin rash or hives.  Stomach pain.  Vomiting.  Diarrhea.  Bloating.  Wheezing or coughing.  People with a severe allergy to food, medicine, or an insect bite may have a life-threatening allergic reaction (anaphylaxis). Symptoms of anaphylaxis include:  Hives.  Itching.  Flushed face.  Swollen lips, tongue, or mouth.  Tight or swollen throat.  Chest pain or tightness in the chest.  Trouble breathing or shortness of breath.  Rapid heartbeat.  Dizziness or fainting.  Vomiting.  Diarrhea.  Pain in the abdomen.  How is this diagnosed? This condition is diagnosed based on:  Your symptoms.  Your family and medical history.  A physical exam.  You may need to see a health care provider who specializes in treating allergies (allergist). You may also have tests, including:  Skin tests to see which allergens are causing your symptoms, such as: ? Skin prick test. In this test, your skin is  pricked with a tiny needle and exposed to small amounts of possible allergens to see if your skin reacts. ? Intradermal skin test. In this test, a small amount of allergen is injected under your skin to see if your skin reacts. ? Patch test. In this test, a small amount of allergen is placed on your skin and then your skin is covered with a bandage. Your health care provider will check your skin after a couple of days to see if a rash has developed.  Blood tests.  Challenges tests. In this test, you inhale a small amount of allergen by mouth to see if you have an allergic reaction.  You may also be asked to:  Keep a food diary. A food diary is a record of all the foods and drinks you have in a day and any symptoms you experience.  Practice an  elimination diet. An elimination diet involves eliminating specific foods from your diet and then adding them back in one by one to find out if a certain food causes an allergic reaction.  How is this treated? Treatment for allergies depends on your symptoms. Treatment may include:  Cold compresses to soothe itching and swelling.  Eye drops.  Nasal sprays.  Using a saline spray or container (neti pot) to flush out the nose (nasal irrigation). These methods can help clear away mucus and keep the nasal passages moist.  Using a humidifier.  Oral antihistamines or other medicines to block allergic reaction and inflammation.  Skin creams to treat rashes or itching.  Diet changes to eliminate food allergy triggers.  Repeated exposure to tiny amounts of allergens to build up a tolerance and prevent future allergic reactions (immunotherapy). These include: ? Allergy shots. ? Oral treatment. This involves taking small doses of an allergen under the tongue (sublingual immunotherapy).  Emergency epinephrine injection (auto-injector) in case of an allergic emergency. This is a self-injectable, pre-measured medicine that must be given within the first  few minutes of a serious allergic reaction.  Follow these instructions at home:  Avoid known allergens whenever possible.  If you suffer from airborne allergens, wash out your nose daily. You can do this with a saline spray or a neti pot to flush out your nose (nasal irrigation).  Take over-the-counter and prescription medicines only as told by your health care provider.  Keep all follow-up visits as told by your health care provider. This is important.  If you are at risk of a severe allergic reaction (anaphylaxis), keep your auto-injector with you at all times.  If you have ever had anaphylaxis, wear a medical alert bracelet or necklace that states you have a severe allergy. Contact a health care provider if:  Your symptoms do not improve with treatment. Get help right away if:  You have symptoms of anaphylaxis, such as: ? Swollen mouth, tongue, or throat. ? Pain or tightness in your chest. ? Trouble breathing or shortness of breath. ? Dizziness or fainting. ? Severe abdominal pain, vomiting, or diarrhea. This information is not intended to replace advice given to you by your health care provider. Make sure you discuss any questions you have with your health care provider. Document Released: 09/08/2002 Document Revised: 10/14/2016 Document Reviewed: 01/01/2016 Elsevier Interactive Patient Education  2018 [employer].      IF you received an x-ray today, you will receive an invoice from [facility]. Please contact [facility] at [phone] with questions or concerns regarding your invoice.   IF you received labwork today, you will receive an invoice from [place]. Please contact [facility] at [phone] with questions or concerns regarding your invoice.   Our billing staff will not be able to assist you with questions regarding bills from these companies.  You will be contacted with the lab results as soon as they are available. The fastest way to get  your results is to activate your My Chart account. Instructions are located on the last page of this paperwork. If you have not heard from [place] regarding the results in 2 weeks, please contact this office.

## 2017-07-08 NOTE — Progress Notes (Signed)
   MRN: [id number] DOB: [birth year]  Subjective:   Donald Jenkins is a 47 y.o. male presenting for follow up on Hypertension. Currently managed with lis-HCTZ. Patient is not checking blood pressure at home. Does not avoid salt in diet, is not exercising, stays very active at work. Denies dizziness, chronic headache, blurred vision, chest pain, shortness of breath, heart racing, palpitations, nausea, vomiting, abdominal pain, hematuria, lower leg swelling. Denies smoking cigarettes. Has occasional drink of alcohol. Patient also takes Singulair for allergies, postnasal drainage. Has tried Flonase but stopped working after a while. Has been using generic Afrin even though he knows he's not supposed to use it frequently. Takes potassium supplement daily.  Donald Jenkins has a current medication list which includes the following prescription(s): alprazolam, lamotrigine, lisinopril-hydrochlorothiazide, mirtazapine, montelukast, potassium, and zaleplon. Also is allergic to penicillins.  Donald Jenkins  has a past medical history of Anxiety and Tachycardia. Also denies past surgical history.  Objective:   Vitals: BP 122/78   Pulse 76   Temp 97.9 F (36.6 C) (Oral)   Resp 18   Ht 6' (1.829 m)   Wt 203 lb (92.1 kg)   SpO2 100%   BMI 27.53 kg/m   BP Readings from Last 3 Encounters:  07/08/17 122/78  01/14/16 128/80  08/07/15 138/76    Physical Exam  Constitutional: He is oriented to person, place, and time. He appears well-developed and well-nourished.  HENT:  TM's intact bilaterally, no effusions or erythema. Nasal turbinates boggy and edematous, nasal passages minimally patent. No sinus tenderness. Oropharynx clear, mucous membranes moist.    Eyes: Pupils are equal, round, and reactive to light. Right eye exhibits no discharge. Left eye exhibits no discharge.  Neck: Normal range of motion. Neck supple.  Cardiovascular: Normal rate, regular rhythm and intact distal pulses. Exam reveals no gallop  and no friction rub.  No murmur heard. Pulmonary/Chest: No respiratory distress. He has no wheezes. He has no rales.  Musculoskeletal: He exhibits no edema.  Lymphadenopathy:    He has no cervical adenopathy.  Neurological: He is alert and oriented to person, place, and time.  Skin: Skin is warm and dry.  Psychiatric: He has a normal mood and affect.    Assessment and Plan :   Essential hypertension, benign - Plan: lisinopril-hydrochlorothiazide (PRINZIDE,ZESTORETIC) 10-12.5 MG tablet, Basic metabolic panel  Multiple allergies   Stable, refilled lis-HCTZ. Labs pending. Follow up in 6 months. Stop Afrin, use Singulair with antihistamine. Follow up in symptoms persist.  Jaynee Eagles, PA-C [facility] [place] [phone] 07/08/2017  9:47 AM

## 2017-07-09 LAB — BASIC METABOLIC PANEL
BUN/Creatinine Ratio: 20 (ref 9–20)
BUN: 15 mg/dL (ref 6–24)
CALCIUM: 9.7 mg/dL (ref 8.7–10.2)
CHLORIDE: 102 mmol/L (ref 96–106)
CO2: 27 mmol/L (ref 20–29)
CREATININE: 0.74 mg/dL — AB (ref 0.76–1.27)
GFR calc Af Amer: 129 mL/min/{1.73_m2} (ref >59)
GFR calc non Af Amer: 111 mL/min/{1.73_m2} (ref >59)
GLUCOSE: 91 mg/dL (ref 65–99)
Potassium: 4.4 mmol/L (ref 3.5–5.2)
Sodium: 144 mmol/L (ref 134–144)

## 2017-12-25 ENCOUNTER — Other Ambulatory Visit: Payer: Self-pay | Admitting: Urgent Care

## 2017-12-25 DIAGNOSIS — N529 Male erectile dysfunction, unspecified: Secondary | ICD-10-CM

## 2017-12-25 DIAGNOSIS — I10 Essential (primary) hypertension: Secondary | ICD-10-CM

## 2017-12-25 DIAGNOSIS — Z79899 Other long term (current) drug therapy: Secondary | ICD-10-CM

## 2017-12-25 DIAGNOSIS — G4700 Insomnia, unspecified: Secondary | ICD-10-CM

## 2017-12-25 NOTE — Telephone Encounter (Signed)
Pt. Called requesting renewal of Sildenafil. Pt. Understands that it has not been prescribed in some time but was hoping to get the refill without a visit. Pt. Advised he may have to come in

## 2017-12-26 NOTE — Telephone Encounter (Signed)
Message re: med renewal sildenafil sent to [facility]

## 2017-12-27 MED ORDER — SILDENAFIL CITRATE 50 MG PO TABS: 50.0000 mg | ORAL_TABLET | Freq: Every day | ORAL | 0 refills | Status: DC | PRN

## 2017-12-27 NOTE — Telephone Encounter (Signed)
Courtesy refill provided for 5 tabs of sildenafil. OV needed for recheck as this medication has not been prescribed since 2016. Requested OV with our scheduling staff.

## 2018-02-28 ENCOUNTER — Ambulatory Visit (HOSPITAL_COMMUNITY)
Admission: EM | Admit: 2018-02-28 | Discharge: 2018-02-28 | Disposition: A | Discharge status: Home / Self Care | Payer: BLUE CROSS/BLUE SHIELD | Attending: Family Medicine | Admitting: Family Medicine

## 2018-02-28 ENCOUNTER — Encounter (HOSPITAL_COMMUNITY): Payer: Self-pay | Admitting: Emergency Medicine

## 2018-02-28 DIAGNOSIS — Z113 Encounter for screening for infections with a predominantly sexual mode of transmission: Secondary | ICD-10-CM | POA: Diagnosis present

## 2018-02-28 DIAGNOSIS — Z79899 Other long term (current) drug therapy: Secondary | ICD-10-CM | POA: Diagnosis not present

## 2018-02-28 DIAGNOSIS — F419 Anxiety disorder, unspecified: Secondary | ICD-10-CM | POA: Insufficient documentation

## 2018-02-28 DIAGNOSIS — K120 Recurrent oral aphthae: Secondary | ICD-10-CM | POA: Insufficient documentation

## 2018-02-28 DIAGNOSIS — I10 Essential (primary) hypertension: Secondary | ICD-10-CM | POA: Diagnosis not present

## 2018-02-28 DIAGNOSIS — Z880 Allergy status to penicillin: Secondary | ICD-10-CM | POA: Diagnosis not present

## 2018-02-28 MED ORDER — ACYCLOVIR 400 MG PO TABS: 400.0000 mg | ORAL_TABLET | Freq: Three times a day (TID) | ORAL | 0 refills | Status: AC

## 2018-02-28 MED ORDER — HYDROXYZINE HCL 25 MG PO TABS: 25.0000 mg | ORAL_TABLET | Freq: Every day | ORAL | 0 refills | Status: DC

## 2018-02-28 NOTE — Discharge Instructions (Signed)
We are checking you for HIV, syphilis, herpes, gonorrhea, chlamydia, trichomonas.  We will call you with these results and provide any further treatment needed.  This appears to be a viral aphthous ulcer on the tongue or "canker sore", but we can begin acyclovir every 8 hours to treat in case of herpes.  Please try hydroxyzine as needed for sleep

## 2018-02-28 NOTE — ED Provider Notes (Signed)
[facility]    CSN: [id number] Arrival date & time: 02/28/18  1455     History   Chief Complaint Chief Complaint  Patient presents with  . SEXUALLY TRANSMITTED DISEASE    HPI Donald Jenkins is a 47 y.o. male history of anxiety, hypertension presenting today for evaluation of STDs.  Patient states that over the past week he has noticed a lesion to the side of his tongue.  It is associated with mild pain.  He feels as if it began as a blister and is slowly changed over time to be yellow with a red face.  He notes that one month ago he had sexual intercourse, which was the first time in approximately 15 years.  He is concerned about STDs being because of this and would like to be screened for all STDs.  He denies any penile discharge or dysuria.  Denies rashes or lesions in genital area.  Denies rash elsewhere on body.  Patient also notes that he has had trouble sleeping over the past 3-4 nights due to concern over this.  HPI  Past Medical History:  Diagnosis Date  . Anxiety   . Tachycardia     Patient Active Problem List   Diagnosis Date Noted  . Essential hypertension, benign 06/06/2015    History reviewed. No pertinent surgical history.     Home Medications    Prior to Admission medications   Medication Sig Start Date End Date Taking? Authorizing Provider  lisinopril-hydrochlorothiazide (PRINZIDE,ZESTORETIC) 10-12.5 MG tablet Take 1 tablet by mouth daily. 07/08/17  Yes Jaynee Eagles, PA-C  acyclovir (ZOVIRAX) 400 MG tablet Take 1 tablet (400 mg total) by mouth 3 (three) times daily for 7 days. 02/28/18 03/07/18  Draylon Mercadel C, PA-C  ALPRAZolam (XANAX XR) 1 MG 24 hr tablet Take 1 mg by mouth daily.    [provider]  hydrOXYzine (ATARAX/VISTARIL) 25 MG tablet Take 1 tablet (25 mg total) by mouth at bedtime. 02/28/18   Beth Spackman C, PA-C  lamoTRIgine (LAMICTAL) 100 MG tablet Take 300 mg by mouth daily.    [provider]  Mirtazapine  (REMERON PO) Take by mouth.    [provider]  montelukast (SINGULAIR) 10 MG tablet Take 1 tablet (10 mg total) by mouth at bedtime. Patient not taking: Reported on 02/28/2018 07/08/17   Jaynee Eagles, PA-C  POTASSIUM PO Take 1 tablet by mouth daily. Over the counter    [provider]  sildenafil (VIAGRA) 50 MG tablet Take 1 tablet (50 mg total) by mouth daily as needed for erectile dysfunction. Office visit needed for additional refills. 1st notice. 12/27/17   Jaynee Eagles, PA-C  zaleplon (SONATA) 10 MG capsule Take 10 mg by mouth at bedtime as needed for sleep. Reported on 01/14/2016    [provider]    Family History No family history on file.  Social History Social History   Tobacco Use  . Smoking status: Never Smoker  . Smokeless tobacco: Never Used  Substance Use Topics  . Alcohol use: No    Alcohol/week: 0.0 standard drinks  . Drug use: No     Allergies   Penicillins   Review of Systems Review of Systems  Constitutional: Negative for fatigue and fever.  HENT: Positive for mouth sores.   Eyes: Negative for redness, itching and visual disturbance.  Respiratory: Negative for shortness of breath.   Cardiovascular: Negative for chest pain and leg swelling.  Gastrointestinal: Negative for nausea and vomiting.  Genitourinary:  Negative for discharge, dysuria, frequency, genital sores and testicular pain.  Musculoskeletal: Negative for arthralgias and myalgias.  Skin: Negative for color change, rash and wound.  Neurological: Negative for dizziness, syncope, weakness, light-headedness and headaches.     Physical Exam Triage Vital Signs ED Triage Vitals [02/28/18 1546]  Enc Vitals Group     BP 136/76     Pulse Rate 90     Resp 16     Temp 98.7 F (37.1 C)     Temp Source Oral     SpO2 100 %     Weight      Height      Head Circumference      Peak Flow      Pain Score      Pain Loc      Pain Edu?      Excl. in [place]?    No data  found.  Updated Vital Signs BP 136/76 (BP Location: Left Arm)   Pulse 90   Temp 98.7 F (37.1 C) (Oral)   Resp 16   SpO2 100%   Visual Acuity Right Eye Distance:   Left Eye Distance:   Bilateral Distance:    Right Eye Near:   Left Eye Near:    Bilateral Near:     Physical Exam  Constitutional: He is oriented to person, place, and time. He appears well-developed and well-nourished.  No acute distress  HENT:  Head: Normocephalic and atraumatic.  Nose: Nose normal.  See pictures below, left lateral side of tongue with whitish/yellowish opaque lesion with surrounding erythema  Eyes: Conjunctivae are normal.  Neck: Neck supple.  Cardiovascular: Normal rate.  Pulmonary/Chest: Effort normal. No respiratory distress.  Abdominal: He exhibits no distension.  Genitourinary:  Genitourinary Comments: Deferred-denied genital symptoms  Musculoskeletal: Normal range of motion.  Neurological: He is alert and oriented to person, place, and time.  Skin: Skin is warm and dry.  Psychiatric: He has a normal mood and affect.  Nursing note and vitals reviewed.        UC Treatments / Results  Labs (all labs ordered are listed, but only abnormal results are displayed) Labs Reviewed  HSV CULTURE AND TYPING  HIV ANTIBODY (ROUTINE TESTING)  RPR  URINE CYTOLOGY ANCILLARY ONLY    EKG None  Radiology No results found.  Procedures Procedures (including critical care time)  Medications Ordered in UC Medications - No data to display  Initial Impression / Assessment and Plan / UC Course  I have reviewed the triage vital signs and the nursing notes.  Pertinent labs & imaging results that were available during my care of the patient were reviewed by me and considered in my medical decision making (see chart for details).     Lesions appeared to be abscess ulcer-like, swab for HSV as well.  Given patient's concern we will go and initiate treatment with acyclovir.  Also checked for  HIV syphilis as well as urine cytology for GC/chlamydia/trichomoniasis.  Will call patient with results and alter treatment as needed.  Discussed strict return precautions. Patient verbalized understanding and is agreeable with plan.  Provided hydroxyzine to use as alternative agent to try to help with sleep.  Follow-up with PCP/psych for further evaluation of insomnia if this persists. Final Clinical Impressions(s) / UC Diagnoses   Final diagnoses:  Ulcer aphthous oral     Discharge Instructions     We are checking you for HIV, syphilis, herpes, gonorrhea, chlamydia, trichomonas.  We will call you with  these results and provide any further treatment needed.  This appears to be a viral aphthous ulcer on the tongue or "canker sore", but we can begin acyclovir every 8 hours to treat in case of herpes.  Please try hydroxyzine as needed for sleep   ED Prescriptions    Medication Sig Dispense Auth. Provider   acyclovir (ZOVIRAX) 400 MG tablet Take 1 tablet (400 mg total) by mouth 3 (three) times daily for 7 days. 21 tablet Calandra Madura C, PA-C   hydrOXYzine (ATARAX/VISTARIL) 25 MG tablet Take 1 tablet (25 mg total) by mouth at bedtime. 12 tablet Mahlani Berninger, [place] C, PA-C     Controlled Substance Prescriptions [place] Controlled Substance Registry consulted? Not Applicable   Janith Lima, [place] 02/28/18 1709

## 2018-02-28 NOTE — ED Triage Notes (Signed)
Pt states he wants to be checked for stds, states he has a sore on his tongue.

## 2018-03-01 LAB — RPR: RPR Ser Ql: NONREACTIVE

## 2018-03-01 LAB — HIV ANTIBODY (ROUTINE TESTING W REFLEX): HIV Screen 4th Generation wRfx: NONREACTIVE

## 2018-03-01 LAB — URINE CYTOLOGY ANCILLARY ONLY
Chlamydia: NEGATIVE
Neisseria Gonorrhea: NEGATIVE
Trichomonas: NEGATIVE

## 2018-03-03 LAB — HSV CULTURE AND TYPING

## 2018-03-18 ENCOUNTER — Encounter: Payer: Self-pay | Admitting: *Deleted

## 2018-03-18 DIAGNOSIS — F4010 Social phobia, unspecified: Secondary | ICD-10-CM

## 2018-03-18 DIAGNOSIS — F410 Panic disorder [episodic paroxysmal anxiety] without agoraphobia: Secondary | ICD-10-CM

## 2018-04-05 ENCOUNTER — Encounter: Payer: Self-pay | Admitting: Psychiatry

## 2018-04-05 ENCOUNTER — Ambulatory Visit (INDEPENDENT_AMBULATORY_CARE_PROVIDER_SITE_OTHER): Payer: BLUE CROSS/BLUE SHIELD | Admitting: Psychiatry

## 2018-04-05 VITALS — BP 118/72 | HR 76 | Ht 72.0 in | Wt 180.0 lb

## 2018-04-05 DIAGNOSIS — F3342 Major depressive disorder, recurrent, in full remission: Secondary | ICD-10-CM | POA: Diagnosis not present

## 2018-04-05 DIAGNOSIS — F4010 Social phobia, unspecified: Secondary | ICD-10-CM | POA: Diagnosis not present

## 2018-04-05 DIAGNOSIS — F410 Panic disorder [episodic paroxysmal anxiety] without agoraphobia: Secondary | ICD-10-CM | POA: Diagnosis not present

## 2018-04-05 DIAGNOSIS — F3341 Major depressive disorder, recurrent, in partial remission: Secondary | ICD-10-CM | POA: Insufficient documentation

## 2018-04-05 MED ORDER — ALPRAZOLAM 1 MG PO TABS: 1.0000 mg | ORAL_TABLET | Freq: Three times a day (TID) | ORAL | 1 refills | Status: DC | PRN

## 2018-04-05 MED ORDER — ZALEPLON 10 MG PO CAPS: 10.0000 mg | ORAL_CAPSULE | Freq: Every evening | ORAL | 1 refills | Status: DC | PRN

## 2018-04-05 NOTE — Progress Notes (Signed)
[facility]  Patient ID: Donald Jenkins,  MRN: [id number]  PCP: System, Provider Not In  Date of Evaluation: 04/05/2018 Time spent:20 minutes   HISTORY/CURRENT STATUS: HPI  Individual Medical History/ Review of Systems: Changes? :Yes. Donald Jenkins is seen individually face-to-face with consent without collateral for psychiatric interview and exam in 17-month evaluation and management of social and panic anxiety and major depression in remission.  He overcame social anxiety second-guessing avoidance to build on Instagram a relationship that has now been fulfilled in person with a new girlfriend figure in [place] he visited once and returns next week.  He left his second job to now be employed solely at [employer].  He realizes his mother's negativity proclivity to depressing influence and depression and has currently discontinued his Remeron and Lamictal losing 29 pounds and then regaining 9.  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  His last panic attack may have been 3 years ago.  He describes well a telescoping coping technique for times of catastrophic thinking by which he works through solutions.  His new girlfriend does have anxiety seeing a psychiatrist and therapist on medications having previous cancer.  Allergies: Penicillins  Current Medications:  Current Outpatient Medications:  .  ALPRAZolam (XANAX) 1 MG tablet, Take 1 tablet (1 mg total) by mouth 3 (three) times daily as needed., Disp: 270 tablet, Rfl: 1 .  hydrOXYzine (ATARAX/VISTARIL) 25 MG tablet, Take 1 tablet (25 mg total) by mouth at bedtime., Disp: 12 tablet, Rfl: 0 .  lisinopril-hydrochlorothiazide (PRINZIDE,ZESTORETIC) 10-12.5 MG tablet, Take 1 tablet by mouth daily., Disp: 90 tablet, Rfl: 3 .  montelukast (SINGULAIR) 10 MG tablet, Take 1 tablet (10 mg total) by mouth at bedtime. (Patient not taking: Reported on 02/28/2018), Disp: 90 tablet, Rfl: 3 .  POTASSIUM PO, Take 1 tablet by mouth  daily. Over the counter, Disp: , Rfl:  .  sildenafil (VIAGRA) 50 MG tablet, Take 1 tablet (50 mg total) by mouth daily as needed for erectile dysfunction. Office visit needed for additional refills. 1st notice., Disp: 5 tablet, Rfl: 0 .  zaleplon (SONATA) 10 MG capsule, Take 1 capsule (10 mg total) by mouth at bedtime as needed for sleep. Reported on 01/14/2016, Disp: 90 capsule, Rfl: 1 Medication Side Effects: Other: Weight Gain  Family Medical/ Social History: Changes? Yes.  He has a friend with cancer which prompted the contact with the new girlfriend cancer survivor who is now in remission.  MENTAL HEALTH EXAM:  Blood pressure 118/72, pulse 76, height 6' (1.829 m), weight 180 lb (81.6 kg).Body mass index is 24.41 kg/m.  General Appearance: Well Groomed  Eye Contact:  Good  Speech:  Clear and Coherent  Volume:  Normal  Mood:  Anxious  Affect:  Appropriate and Congruent  Thought Process:  Goal Directed  Orientation:  Full (Time, Place, and Person)  Thought Content: Obsessions and Rumination   Suicidal Thoughts:  No  Homicidal Thoughts:  No  Memory:  Recent  Judgement:  Good  Insight:  Good  Psychomotor Activity:  Mannerisms  Concentration:  Concentration: Good  Recall:  Good  Fund of Knowledge: Good  Language: Good  Akathisia:  No  AIMS (if indicated): done  Assets:  Desire for Improvement Resilience  ADL's:  Intact  Cognition: WNL  Prognosis:  Good    DIAGNOSES:    ICD-10-CM   1. Social anxiety disorder F40.10 ALPRAZolam (XANAX) 1 MG tablet    zaleplon (SONATA) 10 MG capsule  2.  Panic disorder F41.0   3. Recurrent major depression in full remission ([place]) F33.42     RECOMMENDATIONS: Donald Jenkins has discontinued his medications except for antianxiety related, so that we process and plan particularly for treating any relapse in depression, panic, or cluster B vulnerability early in the course of either follow-up or phone contact.  Lamictal and Remeron have been discontinued by  the patient, so that he now takes Sonata 10 mg nightly as needed for sleep and Xanax 1 mg 3 times daily as needed for social anxiety and pre-panic also managed behaviorally [facility] as a 90-day supply and 1 refill each to return in 6 months.  Luvox may be the best medication if needed for depression and/or anxiety next.    Delight Hoh, MD

## 2018-06-04 ENCOUNTER — Other Ambulatory Visit: Payer: Self-pay

## 2018-06-04 ENCOUNTER — Inpatient Hospital Stay (HOSPITAL_COMMUNITY)
Admission: EM | Admit: 2018-06-04 | Discharge: 2018-06-06 | DRG: 418 | Disposition: A | Discharge status: Home / Self Care | Payer: BLUE CROSS/BLUE SHIELD | Attending: General Surgery | Admitting: General Surgery

## 2018-06-04 ENCOUNTER — Encounter (HOSPITAL_COMMUNITY): Payer: Self-pay | Admitting: Emergency Medicine

## 2018-06-04 DIAGNOSIS — K819 Cholecystitis, unspecified: Secondary | ICD-10-CM | POA: Diagnosis present

## 2018-06-04 DIAGNOSIS — F419 Anxiety disorder, unspecified: Secondary | ICD-10-CM | POA: Diagnosis present

## 2018-06-04 DIAGNOSIS — E876 Hypokalemia: Secondary | ICD-10-CM

## 2018-06-04 DIAGNOSIS — F410 Panic disorder [episodic paroxysmal anxiety] without agoraphobia: Secondary | ICD-10-CM | POA: Diagnosis present

## 2018-06-04 DIAGNOSIS — R188 Other ascites: Secondary | ICD-10-CM | POA: Diagnosis present

## 2018-06-04 DIAGNOSIS — Z880 Allergy status to penicillin: Secondary | ICD-10-CM

## 2018-06-04 DIAGNOSIS — I10 Essential (primary) hypertension: Secondary | ICD-10-CM | POA: Diagnosis present

## 2018-06-04 DIAGNOSIS — K82A2 Perforation of gallbladder in cholecystitis: Secondary | ICD-10-CM | POA: Diagnosis present

## 2018-06-04 DIAGNOSIS — K810 Acute cholecystitis: Principal | ICD-10-CM | POA: Diagnosis present

## 2018-06-04 DIAGNOSIS — Z79899 Other long term (current) drug therapy: Secondary | ICD-10-CM

## 2018-06-04 DIAGNOSIS — F3342 Major depressive disorder, recurrent, in full remission: Secondary | ICD-10-CM | POA: Diagnosis present

## 2018-06-04 DIAGNOSIS — R1011 Right upper quadrant pain: Secondary | ICD-10-CM

## 2018-06-04 HISTORY — DX: Essential (primary) hypertension: I10

## 2018-06-04 NOTE — ED Provider Notes (Signed)
Patient presents with several hours of right-sided and right lower quadrant abdominal pain.  He states that began at work around 5 PM and worsened until about 7:30 PM.  He reports attempting to lie down without relief.  He states that he did get in a bathtub of hot water which improved his symptoms some.  He also reports using ice packs with some relief.    Denies marijuana usage since July.  He reports intermittent alcohol usage.  He denies fever chills, nausea or vomiting, diarrhea.  Patient denies known injuries.  Physical exam: Patient appears distressed, writhing in the chair..  Breath sounds clear and equal with equal chest rise.  Guarding and rebound tenderness to the right lower quadrant and right periumbilical area.  Abdomen is otherwise soft.    Blood work, urinalysis, and CT scan ordered.  MSE was initiated and I personally evaluated the patient and placed orders (if any) at  11:52 PM on June 04, 2018.  The patient appears stable so that the remainder of the MSE may be completed by another provider.   Abigail Butts, [place]hal [place] 06/04/18 2354    Merryl Hacker, MD 06/05/18 [phone]

## 2018-06-04 NOTE — ED Triage Notes (Signed)
Pt c/o RLQ pain that started suddenly at [place] today. Denies nausea/vomiting/diarrhea/fevers.

## 2018-06-05 ENCOUNTER — Inpatient Hospital Stay (HOSPITAL_COMMUNITY): Payer: BLUE CROSS/BLUE SHIELD | Admitting: Certified Registered"

## 2018-06-05 ENCOUNTER — Emergency Department (HOSPITAL_COMMUNITY): Payer: BLUE CROSS/BLUE SHIELD

## 2018-06-05 ENCOUNTER — Encounter (HOSPITAL_COMMUNITY): Admission: EM | Disposition: A | Discharge status: Home / Self Care | Payer: Self-pay | Source: Home / Self Care

## 2018-06-05 DIAGNOSIS — Z880 Allergy status to penicillin: Secondary | ICD-10-CM | POA: Diagnosis not present

## 2018-06-05 DIAGNOSIS — Z79899 Other long term (current) drug therapy: Secondary | ICD-10-CM | POA: Diagnosis not present

## 2018-06-05 DIAGNOSIS — F410 Panic disorder [episodic paroxysmal anxiety] without agoraphobia: Secondary | ICD-10-CM | POA: Diagnosis present

## 2018-06-05 DIAGNOSIS — I10 Essential (primary) hypertension: Secondary | ICD-10-CM | POA: Diagnosis present

## 2018-06-05 DIAGNOSIS — F3342 Major depressive disorder, recurrent, in full remission: Secondary | ICD-10-CM | POA: Diagnosis present

## 2018-06-05 DIAGNOSIS — F419 Anxiety disorder, unspecified: Secondary | ICD-10-CM | POA: Diagnosis present

## 2018-06-05 DIAGNOSIS — K82A2 Perforation of gallbladder in cholecystitis: Secondary | ICD-10-CM | POA: Diagnosis present

## 2018-06-05 DIAGNOSIS — E876 Hypokalemia: Secondary | ICD-10-CM | POA: Diagnosis present

## 2018-06-05 DIAGNOSIS — R188 Other ascites: Secondary | ICD-10-CM | POA: Diagnosis present

## 2018-06-05 DIAGNOSIS — K810 Acute cholecystitis: Secondary | ICD-10-CM | POA: Diagnosis present

## 2018-06-05 DIAGNOSIS — K819 Cholecystitis, unspecified: Secondary | ICD-10-CM | POA: Diagnosis present

## 2018-06-05 HISTORY — PX: CHOLECYSTECTOMY: SHX55

## 2018-06-05 LAB — COMPREHENSIVE METABOLIC PANEL
ALT: 16 U/L (ref 0–44)
AST: 19 U/L (ref 15–41)
Albumin: 4.2 g/dL (ref 3.5–5.0)
Alkaline Phosphatase: 52 U/L (ref 38–126)
Anion gap: 18 — ABNORMAL HIGH (ref 5–15)
BUN: 17 mg/dL (ref 6–20)
CO2: 24 mmol/L (ref 22–32)
CREATININE: 0.84 mg/dL (ref 0.61–1.24)
Calcium: 9.6 mg/dL (ref 8.9–10.3)
Chloride: 96 mmol/L — ABNORMAL LOW (ref 98–111)
GFR calc Af Amer: >60 mL/min (ref >60)
GFR calc non Af Amer: >60 mL/min (ref >60)
Glucose, Bld: 148 mg/dL — ABNORMAL HIGH (ref 70–99)
POTASSIUM: 2.8 mmol/L — AB (ref 3.5–5.1)
Sodium: 138 mmol/L (ref 135–145)
TOTAL PROTEIN: 7.2 g/dL (ref 6.5–8.1)
Total Bilirubin: 0.9 mg/dL (ref 0.3–1.2)

## 2018-06-05 LAB — RAPID URINE DRUG SCREEN, HOSP PERFORMED
AMPHETAMINES: NOT DETECTED
BENZODIAZEPINES: NOT DETECTED
Barbiturates: NOT DETECTED
COCAINE: NOT DETECTED
Opiates: NOT DETECTED
TETRAHYDROCANNABINOL: NOT DETECTED

## 2018-06-05 LAB — CBC
HEMATOCRIT: 39.9 % (ref 39.0–52.0)
Hemoglobin: 13.5 g/dL (ref 13.0–17.0)
MCH: 29.9 pg (ref 26.0–34.0)
MCHC: 33.8 g/dL (ref 30.0–36.0)
MCV: 88.5 fL (ref 80.0–100.0)
NRBC: 0 % (ref 0.0–0.2)
Platelets: 362 10*3/uL (ref 150–400)
RBC: 4.51 MIL/uL (ref 4.22–5.81)
RDW: 13.2 % (ref 11.5–15.5)
WBC: 20.4 10*3/uL — AB (ref 4.0–10.5)

## 2018-06-05 LAB — URINALYSIS, ROUTINE W REFLEX MICROSCOPIC
BACTERIA UA: NONE SEEN
Bilirubin Urine: NEGATIVE
GLUCOSE, UA: NEGATIVE mg/dL
HGB URINE DIPSTICK: NEGATIVE
Ketones, ur: 80 mg/dL — AB
LEUKOCYTES UA: NEGATIVE
NITRITE: NEGATIVE
PH: 6 (ref 5.0–8.0)
PROTEIN: 30 mg/dL — AB
SPECIFIC GRAVITY, URINE: 1.028 (ref 1.005–1.030)

## 2018-06-05 LAB — I-STAT CG4 LACTIC ACID, ED
Lactic Acid, Venous: 0.84 mmol/L (ref 0.5–1.9)
Lactic Acid, Venous: 2.06 mmol/L (ref 0.5–1.9)

## 2018-06-05 LAB — LIPASE, BLOOD: Lipase: 41 U/L (ref 11–51)

## 2018-06-05 SURGERY — LAPAROSCOPIC CHOLECYSTECTOMY WITH INTRAOPERATIVE CHOLANGIOGRAM
Anesthesia: General | Site: Abdomen

## 2018-06-05 MED ORDER — PHENYLEPHRINE 40 MCG/ML (10ML) SYRINGE FOR IV PUSH (FOR BLOOD PRESSURE SUPPORT)
PREFILLED_SYRINGE | INTRAVENOUS | Status: DC | PRN
  Administered 2018-06-05: 80 ug via INTRAVENOUS

## 2018-06-05 MED ORDER — SODIUM CHLORIDE 0.9 % IR SOLN
Status: DC | PRN
  Administered 2018-06-05: 1000 mL

## 2018-06-05 MED ORDER — IOHEXOL 300 MG/ML  SOLN
100.0000 mL | Freq: Once | INTRAMUSCULAR | Status: AC | PRN
  Administered 2018-06-05: 100 mL via INTRAVENOUS

## 2018-06-05 MED ORDER — PHENYLEPHRINE 40 MCG/ML (10ML) SYRINGE FOR IV PUSH (FOR BLOOD PRESSURE SUPPORT)
PREFILLED_SYRINGE | INTRAVENOUS | Status: AC
  Filled 2018-06-05: qty 10

## 2018-06-05 MED ORDER — MEPERIDINE HCL 50 MG/ML IJ SOLN: 6.2500 mg | INTRAMUSCULAR | Status: DC | PRN

## 2018-06-05 MED ORDER — MIDAZOLAM HCL 2 MG/2ML IJ SOLN
INTRAMUSCULAR | Status: AC
  Filled 2018-06-05: qty 2

## 2018-06-05 MED ORDER — POTASSIUM CHLORIDE 10 MEQ/100ML IV SOLN
10.0000 meq | Freq: Once | INTRAVENOUS | Status: AC
  Administered 2018-06-05: 10 meq via INTRAVENOUS
  Filled 2018-06-05: qty 100

## 2018-06-05 MED ORDER — ONDANSETRON HCL 4 MG/2ML IJ SOLN
4.0000 mg | Freq: Once | INTRAMUSCULAR | Status: AC
  Administered 2018-06-05: 4 mg via INTRAVENOUS
  Filled 2018-06-05: qty 2

## 2018-06-05 MED ORDER — SODIUM CHLORIDE 0.9 % IV BOLUS
1000.0000 mL | Freq: Once | INTRAVENOUS | Status: AC
  Administered 2018-06-05: 1000 mL via INTRAVENOUS

## 2018-06-05 MED ORDER — ENOXAPARIN SODIUM 40 MG/0.4ML ~~LOC~~ SOLN
40.0000 mg | SUBCUTANEOUS | Status: DC
  Administered 2018-06-06: 40 mg via SUBCUTANEOUS
  Filled 2018-06-05: qty 0.4

## 2018-06-05 MED ORDER — ROCURONIUM BROMIDE 50 MG/5ML IV SOSY
PREFILLED_SYRINGE | INTRAVENOUS | Status: AC
  Filled 2018-06-05: qty 5

## 2018-06-05 MED ORDER — MIDAZOLAM HCL 2 MG/2ML IJ SOLN
INTRAMUSCULAR | Status: DC | PRN
  Administered 2018-06-05: 2 mg via INTRAVENOUS

## 2018-06-05 MED ORDER — ONDANSETRON HCL 4 MG/2ML IJ SOLN
INTRAMUSCULAR | Status: DC | PRN
  Administered 2018-06-05: 4 mg via INTRAVENOUS

## 2018-06-05 MED ORDER — HYDROMORPHONE HCL 1 MG/ML IJ SOLN: 0.2500 mg | INTRAMUSCULAR | Status: DC | PRN

## 2018-06-05 MED ORDER — DEXAMETHASONE SODIUM PHOSPHATE 10 MG/ML IJ SOLN
INTRAMUSCULAR | Status: AC
  Filled 2018-06-05: qty 1

## 2018-06-05 MED ORDER — ONDANSETRON HCL 4 MG/2ML IJ SOLN
INTRAMUSCULAR | Status: AC
  Filled 2018-06-05: qty 2

## 2018-06-05 MED ORDER — MORPHINE SULFATE (PF) 4 MG/ML IV SOLN
4.0000 mg | Freq: Once | INTRAVENOUS | Status: AC
  Administered 2018-06-05: 4 mg via INTRAVENOUS
  Filled 2018-06-05: qty 1

## 2018-06-05 MED ORDER — SODIUM CHLORIDE 0.9 % IV SOLN
INTRAVENOUS | Status: DC
  Administered 2018-06-05 – 2018-06-06 (×3): via INTRAVENOUS

## 2018-06-05 MED ORDER — 0.9 % SODIUM CHLORIDE (POUR BTL) OPTIME
TOPICAL | Status: DC | PRN
  Administered 2018-06-05: 1000 mL

## 2018-06-05 MED ORDER — HYDROMORPHONE HCL 1 MG/ML IJ SOLN
1.0000 mg | Freq: Once | INTRAMUSCULAR | Status: AC
  Administered 2018-06-05: 1 mg via INTRAVENOUS
  Filled 2018-06-05: qty 1

## 2018-06-05 MED ORDER — LACTATED RINGERS IV SOLN
INTRAVENOUS | Status: DC | PRN
  Administered 2018-06-05 (×2): via INTRAVENOUS

## 2018-06-05 MED ORDER — HYDROMORPHONE HCL 1 MG/ML IJ SOLN
1.0000 mg | INTRAMUSCULAR | Status: DC | PRN
  Administered 2018-06-05 – 2018-06-06 (×4): 1 mg via INTRAVENOUS
  Filled 2018-06-05 (×4): qty 1

## 2018-06-05 MED ORDER — FENTANYL CITRATE (PF) 250 MCG/5ML IJ SOLN
INTRAMUSCULAR | Status: AC
  Filled 2018-06-05: qty 5

## 2018-06-05 MED ORDER — ONDANSETRON HCL 4 MG/2ML IJ SOLN: 4.0000 mg | Freq: Four times a day (QID) | INTRAMUSCULAR | Status: DC | PRN

## 2018-06-05 MED ORDER — ONDANSETRON HCL 4 MG/2ML IJ SOLN: 4.0000 mg | Freq: Once | INTRAMUSCULAR | Status: DC | PRN

## 2018-06-05 MED ORDER — POTASSIUM CHLORIDE 10 MEQ/100ML IV SOLN
10.0000 meq | INTRAVENOUS | Status: DC
  Administered 2018-06-05 (×3): 10 meq via INTRAVENOUS
  Filled 2018-06-05 (×4): qty 100

## 2018-06-05 MED ORDER — SUGAMMADEX SODIUM 200 MG/2ML IV SOLN
INTRAVENOUS | Status: DC | PRN
  Administered 2018-06-05: 200 mg via INTRAVENOUS

## 2018-06-05 MED ORDER — BUPIVACAINE-EPINEPHRINE 0.25% -1:200000 IJ SOLN
INTRAMUSCULAR | Status: DC | PRN
  Administered 2018-06-05: 11 mL

## 2018-06-05 MED ORDER — BUPIVACAINE-EPINEPHRINE (PF) 0.25% -1:200000 IJ SOLN
INTRAMUSCULAR | Status: AC
  Filled 2018-06-05: qty 30

## 2018-06-05 MED ORDER — CIPROFLOXACIN IN D5W 400 MG/200ML IV SOLN
400.0000 mg | Freq: Once | INTRAVENOUS | Status: AC
  Administered 2018-06-05: 400 mg via INTRAVENOUS
  Filled 2018-06-05: qty 200

## 2018-06-05 MED ORDER — TRAMADOL HCL 50 MG PO TABS
50.0000 mg | ORAL_TABLET | Freq: Four times a day (QID) | ORAL | Status: DC | PRN
  Administered 2018-06-05 – 2018-06-06 (×2): 50 mg via ORAL
  Filled 2018-06-05 (×2): qty 1

## 2018-06-05 MED ORDER — PROPOFOL 10 MG/ML IV BOLUS
INTRAVENOUS | Status: AC
  Filled 2018-06-05: qty 20

## 2018-06-05 MED ORDER — ACETAMINOPHEN 325 MG PO TABS
650.0000 mg | ORAL_TABLET | Freq: Four times a day (QID) | ORAL | Status: DC
  Administered 2018-06-05 – 2018-06-06 (×4): 650 mg via ORAL
  Filled 2018-06-05 (×4): qty 2

## 2018-06-05 MED ORDER — ONDANSETRON 4 MG PO TBDP: 4.0000 mg | ORAL_TABLET | Freq: Four times a day (QID) | ORAL | Status: DC | PRN

## 2018-06-05 MED ORDER — LIDOCAINE 2% (20 MG/ML) 5 ML SYRINGE
INTRAMUSCULAR | Status: DC | PRN
  Administered 2018-06-05: 100 mg via INTRAVENOUS

## 2018-06-05 MED ORDER — FENTANYL CITRATE (PF) 250 MCG/5ML IJ SOLN
INTRAMUSCULAR | Status: DC | PRN
  Administered 2018-06-05 (×2): 50 ug via INTRAVENOUS
  Administered 2018-06-05: 150 ug via INTRAVENOUS

## 2018-06-05 MED ORDER — IOPAMIDOL (ISOVUE-300) INJECTION 61%
INTRAVENOUS | Status: AC
  Filled 2018-06-05: qty 50

## 2018-06-05 MED ORDER — ROCURONIUM BROMIDE 10 MG/ML (PF) SYRINGE
PREFILLED_SYRINGE | INTRAVENOUS | Status: DC | PRN
  Administered 2018-06-05: 50 mg via INTRAVENOUS
  Administered 2018-06-05: 20 mg via INTRAVENOUS

## 2018-06-05 MED ORDER — HYDRALAZINE HCL 20 MG/ML IJ SOLN: 10.0000 mg | INTRAMUSCULAR | Status: DC | PRN

## 2018-06-05 MED ORDER — DEXAMETHASONE SODIUM PHOSPHATE 10 MG/ML IJ SOLN
INTRAMUSCULAR | Status: DC | PRN
  Administered 2018-06-05: 10 mg via INTRAVENOUS

## 2018-06-05 MED ORDER — METRONIDAZOLE IN NACL 5-0.79 MG/ML-% IV SOLN
500.0000 mg | Freq: Once | INTRAVENOUS | Status: AC
  Administered 2018-06-05: 500 mg via INTRAVENOUS
  Filled 2018-06-05: qty 100

## 2018-06-05 MED ORDER — PROPOFOL 10 MG/ML IV BOLUS
INTRAVENOUS | Status: DC | PRN
  Administered 2018-06-05: 120 mg via INTRAVENOUS

## 2018-06-05 MED ORDER — IBUPROFEN 800 MG PO TABS
800.0000 mg | ORAL_TABLET | Freq: Three times a day (TID) | ORAL | Status: DC
  Administered 2018-06-05 – 2018-06-06 (×3): 800 mg via ORAL
  Filled 2018-06-05 (×3): qty 1

## 2018-06-05 MED ORDER — CIPROFLOXACIN IN D5W 400 MG/200ML IV SOLN
400.0000 mg | Freq: Two times a day (BID) | INTRAVENOUS | Status: DC
  Administered 2018-06-05 – 2018-06-06 (×3): 400 mg via INTRAVENOUS
  Filled 2018-06-05 (×4): qty 200

## 2018-06-05 SURGICAL SUPPLY — 41 items
APPLIER CLIP 5 13 M/L LIGAMAX5 (MISCELLANEOUS) ×2
CANISTER SUCT 3000ML PPV (MISCELLANEOUS) ×2 IMPLANT
CHLORAPREP W/TINT 26ML (MISCELLANEOUS) ×2 IMPLANT
CLIP APPLIE 5 13 M/L LIGAMAX5 (MISCELLANEOUS) ×1 IMPLANT
CONT SPEC 4OZ CLIKSEAL STRL BL (MISCELLANEOUS) ×2 IMPLANT
COVER MAYO STAND STRL (DRAPES) IMPLANT
COVER SURGICAL LIGHT HANDLE (MISCELLANEOUS) ×2 IMPLANT
COVER WAND RF STERILE (DRAPES) ×2 IMPLANT
DERMABOND ADVANCED (GAUZE/BANDAGES/DRESSINGS) ×1
DERMABOND ADVANCED .7 DNX12 (GAUZE/BANDAGES/DRESSINGS) ×1 IMPLANT
DRAIN CHANNEL 19F RND (DRAIN) ×2 IMPLANT
DRAPE C-ARM 42X72 X-RAY (DRAPES) IMPLANT
ELECT REM PT RETURN 9FT ADLT (ELECTROSURGICAL) ×2
ELECTRODE REM PT RTRN 9FT ADLT (ELECTROSURGICAL) ×1 IMPLANT
EVACUATOR SILICONE 100CC (DRAIN) ×2 IMPLANT
FILTER SMOKE EVAC LAPAROSHD (FILTER) ×2 IMPLANT
GLOVE BIO SURGEON STRL SZ 6 (GLOVE) ×2 IMPLANT
GLOVE INDICATOR 6.5 STRL GRN (GLOVE) ×2 IMPLANT
GOWN STRL REUS W/ TWL LRG LVL3 (GOWN DISPOSABLE) ×3 IMPLANT
GOWN STRL REUS W/TWL LRG LVL3 (GOWN DISPOSABLE) ×3
GRASPER SUT TROCAR 14GX15 (MISCELLANEOUS) ×2 IMPLANT
KIT BASIN OR (CUSTOM PROCEDURE TRAY) ×2 IMPLANT
KIT TURNOVER KIT B (KITS) ×2 IMPLANT
NEEDLE INSUFFLATION 14GA 120MM (NEEDLE) ×2 IMPLANT
NS IRRIG 1000ML POUR BTL (IV SOLUTION) ×2 IMPLANT
PAD ARMBOARD 7.5X6 YLW CONV (MISCELLANEOUS) ×2 IMPLANT
POUCH SPECIMEN RETRIEVAL 10MM (ENDOMECHANICALS) ×2 IMPLANT
SCISSORS LAP 5X35 DISP (ENDOMECHANICALS) ×2 IMPLANT
SET CHOLANGIOGRAPH 5 50 .035 (SET/KITS/TRAYS/PACK) IMPLANT
SET IRRIG TUBING LAPAROSCOPIC (IRRIGATION / IRRIGATOR) ×2 IMPLANT
SLEEVE ENDOPATH XCEL 5M (ENDOMECHANICALS) ×4 IMPLANT
SURGICEL SNOW 2X4 (HEMOSTASIS) ×2 IMPLANT
SUT ETHILON 2 0 FS 18 (SUTURE) ×2 IMPLANT
SUT MNCRL AB 4-0 PS2 18 (SUTURE) ×2 IMPLANT
TOWEL OR 17X24 6PK STRL BLUE (TOWEL DISPOSABLE) ×2 IMPLANT
TOWEL OR 17X26 10 PK STRL BLUE (TOWEL DISPOSABLE) ×2 IMPLANT
TRAY LAPAROSCOPIC MC (CUSTOM PROCEDURE TRAY) ×2 IMPLANT
TROCAR XCEL NON-BLD 11X100MML (ENDOMECHANICALS) ×2 IMPLANT
TROCAR XCEL NON-BLD 5MMX100MML (ENDOMECHANICALS) ×2 IMPLANT
TUBING INSUFFLATION (TUBING) ×2 IMPLANT
WATER STERILE IRR 1000ML POUR (IV SOLUTION) ×2 IMPLANT

## 2018-06-05 NOTE — Op Note (Signed)
Operative Note  Donald Jenkins 47 y.o. male [id number]  06/05/2018  Surgeon: Clovis Riley MD  Assistant: Verita Lamb  Procedure performed: Laparoscopic Cholecystectomy  Preop diagnosis: acute cholecystitis Post-op diagnosis/intraop findings: acute cholecystitis with bilious ascites consistent with perforated gallbladder  Specimens: gallbladder  EBL: minimal  Complications: none  Description of procedure: After obtaining informed consent the patient was brought to the operating room. Patient is on standing antibiotics. SCD's were applied. General endotracheal anesthesia was initiated and a formal time-out was performed. The abdomen was prepped and draped in the usual sterile fashion and the abdomen was entered using an infraumbilical veress needle after instilling the site with local. Insufflation to 58mmHg was obtained, 5 mm camera and trocar inserted and gross inspection revealed no evidence of injury from our entry however immediately notable was bilious ascites most concentrated in the right upper quadrant. Two 48mm trocars were introduced in the right midclavicular and right anterior axillary lines under direct visualization and following infiltration with local. An 81mm trocar was placed in the epigastrium. The gallbladder was encased in omental adhesions which were carefully taken down using cautery and blunt dissection. Once the gallbladder could be identified, it was noted to be contracted and acutely inflamed with dense thickening of the peritoneum. The gallbladder was retracted cephalad and the infundibulum was retracted laterally. A combination of hook electrocautery and blunt dissection was utilized to clear the peritoneum from the neck and cystic duct, circumferentially isolating the cystic artery and cystic duct and lifting the gallbladder from the cystic plate. The critical view of safety was achieved with the cystic artery, cystic duct, and liver bed visualized between them  with no other structures. The artery was clipped with a single clip proximally and distally and divided as was the cystic duct with three clips on the proximal end. The artery had a posterior branch which was clipped as well prior to dividing it.The gallbladder was dissected from the liver plate using electrocautery. Again dense inflammatory reaction was present in the plane between gallbladder and liver was obliterated. The gallbladder wall was dilated in a couple places during this dissection and a wedge of liver tissue was taken off with the gallbladder. During dissection, I really did not see any specific point on the gallbladder where the perforation may have occurred as the source of bilious ascites. Once freed the gallbladder was placed in an endocatch bag and removed through the epigastric trocar site. Bleeding on the liver bed was controlled with cautery an Surgicel Snow. The bilious ascites was aspirated and the abdomen was irrigated. Hemostasis was once again confirmed. The remainder of the abdomen was inspected, there was no evidence of small bowel injury or perforation, no evidence of duodenal perforation or gastric perforation or other source of the bilious ascites. The clips were well opposed without any bile leak from the duct or the liver bed. A 19 French round Blake drain was inserted through the right lateral trocar site and directed up into the liver bed as well as down along the right colic gutter. This was secured to the skin with a 2-0 nylon and placed to bulb suction.The 16mm trocar site in the epigastrium was closed with two 0 vicryls in the fascia under direct visualization using a PMI device. The abdomen was desufflated and all trocars removed. The skin incisions were closed with running subcuticular monocryl and Dermabond. A sterile dressing was placed around the drain site. The patient was awakened, extubated and transported to the recovery room  in stable condition.   All counts were  correct at the completion of the case.

## 2018-06-05 NOTE — Anesthesia Preprocedure Evaluation (Signed)
Anesthesia Evaluation  Patient identified by MRN, date of birth, ID band Patient awake    Reviewed: Allergy & Precautions, NPO status , Patient's Chart, lab work & pertinent test results  Airway Mallampati: I  TM Distance: >3 FB Neck ROM: Full    Dental   Pulmonary    Pulmonary exam normal        Cardiovascular hypertension, Pt. on medications Normal cardiovascular exam     Neuro/Psych Anxiety Depression    GI/Hepatic   Endo/Other    Renal/GU      Musculoskeletal   Abdominal   Peds  Hematology   Anesthesia Other Findings   Reproductive/Obstetrics                             Anesthesia Physical Anesthesia Plan  ASA: II  Anesthesia Plan: General   Post-op Pain Management:    Induction: Intravenous  PONV Risk Score and Plan: 2 and Ondansetron and Midazolam  Airway Management Planned: Oral ETT  Additional Equipment:   Intra-op Plan:   Post-operative Plan: Extubation in OR  Informed Consent: I have reviewed the patients History and Physical, chart, labs and discussed the procedure including the risks, benefits and alternatives for the proposed anesthesia with the patient or authorized representative who has indicated his/her understanding and acceptance.     Plan Discussed with: CRNA and Surgeon  Anesthesia Plan Comments:         Anesthesia Quick Evaluation

## 2018-06-05 NOTE — ED Provider Notes (Addendum)
[facility] EMERGENCY DEPARTMENT Provider Note   CSN: [id number] Arrival date & time: 06/04/18  2320     History   Chief Complaint Chief Complaint  Patient presents with  . Abdominal Pain    HPI LIAHM GRIVAS is a 47 y.o. male.  HPI  This a 47 year old male with a history of anxiety and tachycardia who presents with right-sided abdominal pain.  Patient reports that he felt well earlier today.  Around 5 PM he noted some discomfort in his right lower abdomen.  It progressively worsened while he was at work.  He states by 7:30 PM he was doubled over in pain.  Pain does not radiate into his groin or his back.  He has never had pain like this before.  He describes it as sharp.  He states that when he went home and took a bath and applied ice which seemed to relieve his pain some.  However, pain returned with increasing intensity.  He rates his pain right now at 10 out of 10.  Denies nausea, vomiting, diarrhea, constipation.  No noted fevers.  Denies any change in pain with movement or eating.  Past Medical History:  Diagnosis Date  . Anxiety   . Tachycardia     Patient Active Problem List   Diagnosis Date Noted  . Recurrent major depression in full remission ([place]) 04/05/2018  . Social anxiety disorder 03/18/2018  . Panic disorder 03/18/2018  . Essential hypertension, benign 06/06/2015    History reviewed. No pertinent surgical history.      Home Medications    Prior to Admission medications   Medication Sig Start Date End Date Taking? Authorizing Provider  ALPRAZolam Duanne Moron) 1 MG tablet Take 1 tablet (1 mg total) by mouth 3 (three) times daily as needed. Patient taking differently: Take 1 mg by mouth 3 (three) times daily as needed for anxiety.  04/05/18  Yes Delight Hoh, MD  lisinopril-hydrochlorothiazide (PRINZIDE,ZESTORETIC) 10-12.5 MG tablet Take 1 tablet by mouth daily. 07/08/17  Yes Jaynee Eagles, PA-C  montelukast (SINGULAIR) 10 MG tablet  Take 1 tablet (10 mg total) by mouth at bedtime. Patient taking differently: Take 10 mg by mouth every other day.  07/08/17  Yes Jaynee Eagles, PA-C  POTASSIUM PO Take 1 tablet by mouth daily. Over the counter   Yes [provider]  sildenafil (VIAGRA) 50 MG tablet Take 1 tablet (50 mg total) by mouth daily as needed for erectile dysfunction. Office visit needed for additional refills. 1st notice. 12/27/17  Yes Jaynee Eagles, PA-C  zaleplon (SONATA) 10 MG capsule Take 1 capsule (10 mg total) by mouth at bedtime as needed for sleep. Reported on 01/14/2016 04/05/18  Yes Delight Hoh, MD  hydrOXYzine (ATARAX/VISTARIL) 25 MG tablet Take 1 tablet (25 mg total) by mouth at bedtime. Patient not taking: Reported on 06/05/2018 02/28/18   Janith Lima, PA-C    Family History No family history on file.  Social History Social History   Tobacco Use  . Smoking status: Never Smoker  . Smokeless tobacco: Never Used  Substance Use Topics  . Alcohol use: No    Alcohol/week: 0.0 standard drinks  . Drug use: No     Allergies   Penicillins   Review of Systems Review of Systems  Constitutional: Negative for fever.  Respiratory: Negative for shortness of breath.   Cardiovascular: Negative for chest pain.  Gastrointestinal: Positive for abdominal pain. Negative for constipation, diarrhea, nausea and vomiting.  Genitourinary: Negative for  dysuria, enuresis and hematuria.  All other systems reviewed and are negative.    Physical Exam Updated Vital Signs BP (!) 143/95   Pulse 92   Temp 98.2 F (36.8 C) (Oral)   Resp 16   SpO2 99%   Physical Exam  Constitutional: He is oriented to person, place, and time. He appears well-developed and well-nourished. No distress.  Uncomfortable appearing  HENT:  Head: Normocephalic and atraumatic.  Neck: Neck supple.  Cardiovascular: Normal rate, regular rhythm and normal heart sounds.  No murmur heard. Pulmonary/Chest: Effort normal and breath  sounds normal. No respiratory distress. He has no wheezes.  Abdominal: Soft. Bowel sounds are normal. There is tenderness in the right upper quadrant, right lower quadrant and epigastric area. There is no rebound and no guarding.  Musculoskeletal: He exhibits no edema.  Lymphadenopathy:    He has no cervical adenopathy.  Neurological: He is alert and oriented to person, place, and time.  Skin: Skin is warm and dry.  Psychiatric: He has a normal mood and affect.  Nursing note and vitals reviewed.    ED Treatments / Results  Labs (all labs ordered are listed, but only abnormal results are displayed) Labs Reviewed  COMPREHENSIVE METABOLIC PANEL - Abnormal; Notable for the following components:      Result Value   Potassium 2.8 (*)    Chloride 96 (*)    Glucose, Bld 148 (*)    Anion gap 18 (*)    All other components within normal limits  CBC - Abnormal; Notable for the following components:   WBC 20.4 (*)    All other components within normal limits  URINALYSIS, ROUTINE W REFLEX MICROSCOPIC - Abnormal; Notable for the following components:   Ketones, ur 80 (*)    Protein, ur 30 (*)    All other components within normal limits  I-STAT CG4 LACTIC ACID, ED - Abnormal; Notable for the following components:   Lactic Acid, Venous 2.06 (*)    All other components within normal limits  LIPASE, BLOOD  RAPID URINE DRUG SCREEN, HOSP PERFORMED  I-STAT CG4 LACTIC ACID, ED    EKG None  Radiology Ct Abdomen Pelvis W Contrast  Result Date: 06/05/2018 CLINICAL DATA:  47 year old male with abdominal pain. Concern for acute appendicitis. EXAM: CT ABDOMEN AND PELVIS WITH CONTRAST TECHNIQUE: Multidetector CT imaging of the abdomen and pelvis was performed using the standard protocol following bolus administration of intravenous contrast. CONTRAST:  163mL OMNIPAQUE IOHEXOL 300 MG/ML  SOLN COMPARISON:  None. FINDINGS: Lower chest: There are minimal bibasilar atelectatic changes of the lung bases.  No intra-abdominal free air. There is small subhepatic free fluid as well as small amount of fluid within the pelvis. Hepatobiliary: The liver is unremarkable. No intrahepatic biliary ductal dilatation. There is minimal thickened appearance of the gallbladder wall. No definite calcified stone. A tiny focus of calcification in the porta hepatic (series 3, image 22) may represent vascular calcification. There is stranding of the fat surrounding the gallbladder and second portion of the duodenum with extension of the inflammatory fluid along the right paracolic gutter into the pelvis. An acute cholecystitis is not entirely excluded. Correlation with clinical exam and further evaluation with gallbladder ultrasound recommended. Pancreas: Unremarkable. No pancreatic ductal dilatation or surrounding inflammatory changes. Spleen: Normal in size without focal abnormality. Adrenals/Urinary Tract: The adrenal glands are unremarkable. The kidneys, visualized ureters and urinary bladder appear unremarkable as well. Stomach/Bowel: Top-normal caliber loops of small bowel in the mid abdomen, likely reactive ileus.  No evidence of bowel obstruction. The appendix is normal. Vascular/Lymphatic: No significant vascular findings are present. No enlarged abdominal or pelvic lymph nodes. Reproductive: The prostate and seminal vesicles are grossly unremarkable. No pelvic mass. Other: None Musculoskeletal: No acute or significant osseous findings. IMPRESSION: 1. Inflammatory changes surrounding the gallbladder and second portion of the duodenum with extension of the inflammatory fluid along the right paracolic gutter into the pelvis. An acute cholecystitis is not entirely excluded. Correlation with clinical exam and further evaluation with gallbladder ultrasound recommended. 2. No bowel obstruction. Normal appendix. Electronically Signed   By: Anner Crete M.D.   On: 06/05/2018 02:03   US Abdomen Limited Ruq  Result Date:  06/05/2018 CLINICAL DATA:  47 year old male with right upper quadrant abdominal pain. EXAM: ULTRASOUND ABDOMEN LIMITED RIGHT UPPER QUADRANT COMPARISON:  CT of the abdomen pelvis dated 06/05/2018 FINDINGS: Gallbladder: The gallbladder is filled with stones. There is no gallbladder wall thickening or pericholecystic fluid. Negative sonographic Murphy's sign. Common bile duct: Diameter: 8 mm Liver: The liver is unremarkable. Portal vein is patent on color Doppler imaging with normal direction of blood flow towards the liver. IMPRESSION: Cholelithiasis without sonographic evidence acute cholecystitis. Electronically Signed   By: Anner Crete M.D.   On: 06/05/2018 04:14    Procedures Procedures (including critical care time)  CRITICAL CARE Performed by: Merryl Hacker   Total critical care time: 31 minutes  Critical care time was exclusive of separately billable procedures and treating other patients.  Critical care was necessary to treat or prevent imminent or life-threatening deterioration.  Critical care was time spent personally by me on the following activities: development of treatment plan with patient and/or surrogate as well as nursing, discussions with consultants, evaluation of patient's response to treatment, examination of patient, obtaining history from patient or surrogate, ordering and performing treatments and interventions, ordering and review of laboratory studies, ordering and review of radiographic studies, pulse oximetry and re-evaluation of patient's condition.   Medications Ordered in ED Medications  metroNIDAZOLE (FLAGYL) IVPB 500 mg (has no administration in time range)  morphine 4 MG/ML injection 4 mg (4 mg Intravenous Given 06/05/18 0032)  ondansetron (ZOFRAN) injection 4 mg (4 mg Intravenous Given 06/05/18 0032)  sodium chloride 0.9 % bolus 1,000 mL (0 mLs Intravenous Stopped 06/05/18 0355)  iohexol (OMNIPAQUE) 300 MG/ML solution 100 mL (100 mLs Intravenous  Contrast Given 06/05/18 0110)  HYDROmorphone (DILAUDID) injection 1 mg (1 mg Intravenous Given 06/05/18 0226)  potassium chloride 10 mEq in 100 mL IVPB (0 mEq Intravenous Stopped 06/05/18 0356)  ciprofloxacin (CIPRO) IVPB 400 mg (400 mg Intravenous New Bag/Given 06/05/18 0313)  HYDROmorphone (DILAUDID) injection 1 mg (1 mg Intravenous Given 06/05/18 0313)     Initial Impression / Assessment and Plan / ED Course  I have reviewed the triage vital signs and the nursing notes.  Pertinent labs & imaging results that were available during my care of the patient were reviewed by me and considered in my medical decision making (see chart for details).  Clinical Course as of Jun 05 428  Nancy Fetter Jun 05, 2018  0219 CT scan reviewed.  Marked inflammation in the right upper quadrant around the gallbladder and intestine.  Patient does have a leukocytosis to 20.  He continues to have significant pain on exam.  Will obtain right upper quadrant ultrasound.  Patient empirically given Cipro and Flagyl for intra-abdominal infection coverage.  Will reassess.   [CH]    Clinical Course User Index [CH] Sindhu Nguyen, Loma Sousa  F, MD    Patient presents with right-sided abdominal pain.  Fairly acute in onset.  He is uncomfortable appearing on exam but nontoxic.  He does have reproducible tenderness over the epigastrium, right upper quadrant and right mid abdomen.  Given acuity of symptoms, appendicitis would be less likely.  Cholecystitis or gallbladder pathology is a consideration.  Kidney stone is also consideration.  CT scan ordered from triage.  This will give the best overall picture.  He does have a leukocytosis to 20.  He has required multiple doses of pain medication to control his pain.  He is also mildly hypokalemic.  This was replaced.  See clinical course above.  CT scan shows inflammation of the right upper quadrant around the gallbladder and intestine.  Follow-up right upper quadrant ultrasound was obtained and shows  cholelithiasis.  It was read as no evidence of acute cholecystitis; however, given white count and inflammation noted on CT scan, feel this clinically presents as cholecystitis.  Patient is having ongoing discomfort although he is nontoxic-appearing.  He has received Cipro and Flagyl as he has a penicillin allergy.  We will plan for consult general surgery.  Anticipate admission.  Final Clinical Impressions(s) / ED Diagnoses   Final diagnoses:  RUQ pain  Cholecystitis  Hypokalemia    ED Discharge Orders    None       Dalana Pfahler, Barbette Hair, MD 06/05/18 3419    Merryl Hacker, MD 06/13/18 [id number]

## 2018-06-05 NOTE — Anesthesia Procedure Notes (Signed)
Procedure Name: Intubation Date/Time: 06/05/2018 12:17 PM Performed by: Teressa Lower., CRNA Pre-anesthesia Checklist: Patient identified, Emergency Drugs available, Suction available and Patient being monitored Patient Re-evaluated:Patient Re-evaluated prior to induction Oxygen Delivery Method: Circle system utilized Preoxygenation: Pre-oxygenation with 100% oxygen Induction Type: IV induction Ventilation: Mask ventilation without difficulty Laryngoscope Size: Mac and 4 Grade View: Grade I Tube type: Oral Tube size: 7.5 mm Number of attempts: 1 Airway Equipment and Method: Stylet and Oral airway Placement Confirmation: ETT inserted through vocal cords under direct vision,  positive ETCO2 and breath sounds checked- equal and bilateral Secured at: 23 cm Tube secured with: Tape Dental Injury: Teeth and Oropharynx as per pre-operative assessment

## 2018-06-05 NOTE — Anesthesia Postprocedure Evaluation (Signed)
Anesthesia Post Note  Patient: Donald Jenkins  Procedure(s) Performed: LAPAROSCOPIC CHOLECYSTECTOMY WITH POSSIBLE INTRAOPERATIVE CHOLANGIOGRAM (N/A Abdomen)     Patient location during evaluation: PACU Anesthesia Type: General Level of consciousness: awake and alert Pain management: pain level controlled Vital Signs Assessment: post-procedure vital signs reviewed and stable Respiratory status: spontaneous breathing, nonlabored ventilation, respiratory function stable and patient connected to nasal cannula oxygen Cardiovascular status: blood pressure returned to baseline and stable Postop Assessment: no apparent nausea or vomiting Anesthetic complications: no    Last Vitals:  Vitals:   06/05/18 1345 06/05/18 1355  BP: 119/72   Pulse: 86 (!) 101  Resp: (!) 22 20  Temp:    SpO2: 98% 100%    Last Pain:  Vitals:   06/05/18 1355  TempSrc:   PainSc: 2                  Graison Leinberger DAVID

## 2018-06-05 NOTE — ED Notes (Signed)
Surgeon at bedside.  

## 2018-06-05 NOTE — ED Notes (Signed)
Patient updated on status - informed consent signed and at the bedside. Ice pack given to apply to abdomen for pain.

## 2018-06-05 NOTE — Transfer of Care (Signed)
Immediate Anesthesia Transfer of Care Note  Patient: Donald Jenkins  Procedure(s) Performed: LAPAROSCOPIC CHOLECYSTECTOMY WITH POSSIBLE INTRAOPERATIVE CHOLANGIOGRAM (N/A Abdomen)  Patient Location: PACU  Anesthesia Type:General  Level of Consciousness: awake, alert  and oriented  Airway & Oxygen Therapy: Patient Spontanous Breathing and Patient connected to face mask oxygen  Post-op Assessment: Report given to RN and Post -op Vital signs reviewed and stable  Post vital signs: Reviewed and stable  Last Vitals:  Vitals Value Taken Time  BP 127/85 06/05/2018  1:29 PM  Temp    Pulse 105 06/05/2018  1:30 PM  Resp 20 06/05/2018  1:29 PM  SpO2 97 % 06/05/2018  1:30 PM  Vitals shown include unvalidated device data.  Last Pain:  Vitals:   06/05/18 1050  TempSrc:   PainSc: 9          Complications: No apparent anesthesia complications

## 2018-06-05 NOTE — Interval H&P Note (Signed)
History and Physical Interval Note:  06/05/2018 10:09 AM  Donald Jenkins  has presented today for surgery, with the diagnosis of ACUTE CHOLECYSTITIS  The various methods of treatment have been discussed with the patient and family. After consideration of risks, benefits and other options for treatment, the patient has consented to  Procedure(s): LAPAROSCOPIC CHOLECYSTECTOMY WITH POSSIBLE INTRAOPERATIVE CHOLANGIOGRAM (N/A) as a surgical intervention .  The patient's history has been reviewed, patient examined, no change in status, stable for surgery.  I have reviewed the patient's chart and labs.  Questions were answered to the patient's satisfaction.     Grey Rakestraw Rich Brave

## 2018-06-05 NOTE — H&P (Signed)
Donald Jenkins is an 47 y.o. male.   Chief Complaint: ruq pain HPI: 76 yom with one episode three years ago with ruq pain, doing well until yesterday after eating fatty meal and subsequently had ruq pain that is now to epigastrium and pelvis on right.  This got worse yesterday and nothing at home made better.  No emesis. No fever. No change in am. Has ct that shows a lot of inflammation related to gb and [place] with gallstones.  Wbc was 22k.  I was asked to see him  Past Medical History:  Diagnosis Date  . Anxiety   . Tachycardia     History reviewed. No pertinent surgical history.  No family history on file. Social History:  reports that he has never smoked. He has never used smokeless tobacco. He reports that he does not drink alcohol or use drugs.  Allergies:  Allergies  Allergen Reactions  . Penicillins Other (See Comments)    From childhood    meds lisinopril   Results for orders placed or performed during the hospital encounter of 06/04/18 (from the past 48 hour(s))  Urinalysis, Routine w reflex microscopic     Status: Abnormal   Collection Time: 06/04/18 12:30 AM  Result Value Ref Range   Color, Urine YELLOW YELLOW   APPearance CLEAR CLEAR   Specific Gravity, Urine 1.028 1.005 - 1.030   pH 6.0 5.0 - 8.0   Glucose, UA NEGATIVE NEGATIVE mg/dL   Hgb urine dipstick NEGATIVE NEGATIVE   Bilirubin Urine NEGATIVE NEGATIVE   Ketones, ur 80 (A) NEGATIVE mg/dL   Protein, ur 30 (A) NEGATIVE mg/dL   Nitrite NEGATIVE NEGATIVE   Leukocytes, UA NEGATIVE NEGATIVE   RBC / HPF 0-5 0 - 5 RBC/hpf   WBC, UA 0-5 0 - 5 WBC/hpf   Bacteria, UA NONE SEEN NONE SEEN   Squamous Epithelial / LPF 0-5 0 - 5   Mucus PRESENT    Hyaline Casts, UA PRESENT     Comment: Performed at [facility], [address] [place], [place] [address]  Rapid urine drug screen (hospital performed)     Status: None   Collection Time: 06/04/18 12:30 AM  Result Value Ref Range   Opiates NONE DETECTED NONE  DETECTED   Cocaine NONE DETECTED NONE DETECTED   Benzodiazepines NONE DETECTED NONE DETECTED   Amphetamines NONE DETECTED NONE DETECTED   Tetrahydrocannabinol NONE DETECTED NONE DETECTED   Barbiturates NONE DETECTED NONE DETECTED    Comment: (NOTE) DRUG SCREEN FOR MEDICAL PURPOSES ONLY.  IF CONFIRMATION IS NEEDED FOR ANY PURPOSE, NOTIFY LAB WITHIN 5 DAYS. LOWEST DETECTABLE LIMITS FOR URINE DRUG SCREEN Drug Class                     Cutoff (ng/mL) Amphetamine and metabolites    1000 Barbiturate and metabolites    200 Benzodiazepine                 270 Tricyclics and metabolites     300 Opiates and metabolites        300 Cocaine and metabolites        300 THC                            50 Performed at [facility], [place] [address], [place], [place] [address]   Lipase, blood     Status: None   Collection Time: 06/04/18 11:42 PM  Result Value Ref Range   Lipase 41 11 - 51 U/L    Comment: Performed at [place] [address], [place], [place] [address]  Comprehensive metabolic panel     Status: Abnormal   Collection Time: 06/04/18 11:42 PM  Result Value Ref Range   Sodium 138 135 - 145 mmol/L   Potassium 2.8 (L) 3.5 - 5.1 mmol/L   Chloride 96 (L) 98 - 111 mmol/L   CO2 24 22 - 32 mmol/L   Glucose, Bld 148 (H) 70 - 99 mg/dL   BUN 17 6 - 20 mg/dL   Creatinine, Ser 0.84 0.61 - 1.24 mg/dL   Calcium 9.6 8.9 - 10.3 mg/dL   Total Protein 7.2 6.5 - 8.1 g/dL   Albumin 4.2 3.5 - 5.0 g/dL   AST 19 15 - 41 U/L   ALT 16 0 - 44 U/L   Alkaline Phosphatase 52 38 - 126 U/L   Total Bilirubin 0.9 0.3 - 1.2 mg/dL   GFR calc non Af Amer >60 >60 mL/min   GFR calc Af Amer >60 >60 mL/min   Anion gap 18 (H) 5 - 15    Comment: Performed at [facility], [place] [address], [place], [place] [address]  CBC     Status: Abnormal   Collection Time: 06/04/18 11:42 PM  Result Value Ref Range   WBC 20.4 (H) 4.0 - 10.5 K/uL   RBC 4.51 4.22 - 5.81 MIL/uL   Hemoglobin 13.5 13.0 - 17.0  g/dL   HCT 39.9 39.0 - 52.0 %   MCV 88.5 80.0 - 100.0 fL   MCH 29.9 26.0 - 34.0 pg   MCHC 33.8 30.0 - 36.0 g/dL   RDW 13.2 11.5 - 15.5 %   Platelets 362 150 - 400 K/uL   nRBC 0.0 0.0 - 0.2 %    Comment: Performed at [facility], [place] [address], [place], [place] [address]  I-Stat CG4 Lactic Acid, ED     Status: Abnormal   Collection Time: 06/05/18 12:04 AM  Result Value Ref Range   Lactic Acid, Venous 2.06 (HH) 0.5 - 1.9 mmol/L   Comment NOTIFIED PHYSICIAN   I-Stat CG4 Lactic Acid, ED     Status: None   Collection Time: 06/05/18  1:40 AM  Result Value Ref Range   Lactic Acid, Venous 0.84 0.5 - 1.9 mmol/L   Ct Abdomen Pelvis W Contrast  Result Date: 06/05/2018 CLINICAL DATA:  47 year old male with abdominal pain. Concern for acute appendicitis. EXAM: CT ABDOMEN AND PELVIS WITH CONTRAST TECHNIQUE: Multidetector CT imaging of the abdomen and pelvis was performed using the standard protocol following bolus administration of intravenous contrast. CONTRAST:  124mL OMNIPAQUE IOHEXOL 300 MG/ML  SOLN COMPARISON:  None. FINDINGS: Lower chest: There are minimal bibasilar atelectatic changes of the lung bases. No intra-abdominal free air. There is small subhepatic free fluid as well as small amount of fluid within the pelvis. Hepatobiliary: The liver is unremarkable. No intrahepatic biliary ductal dilatation. There is minimal thickened appearance of the gallbladder wall. No definite calcified stone. A tiny focus of calcification in the porta hepatic (series 3, image 22) may represent vascular calcification. There is stranding of the fat surrounding the gallbladder and second portion of the duodenum with extension of the inflammatory fluid along the right paracolic gutter into the pelvis. An acute cholecystitis is not entirely excluded. Correlation with clinical exam and further evaluation with gallbladder ultrasound recommended. Pancreas: Unremarkable. No pancreatic ductal dilatation  or surrounding  inflammatory changes. Spleen: Normal in size without focal abnormality. Adrenals/Urinary Tract: The adrenal glands are unremarkable. The kidneys, visualized ureters and urinary bladder appear unremarkable as well. Stomach/Bowel: Top-normal caliber loops of small bowel in the mid abdomen, likely reactive ileus. No evidence of bowel obstruction. The appendix is normal. Vascular/Lymphatic: No significant vascular findings are present. No enlarged abdominal or pelvic lymph nodes. Reproductive: The prostate and seminal vesicles are grossly unremarkable. No pelvic mass. Other: None Musculoskeletal: No acute or significant osseous findings. IMPRESSION: 1. Inflammatory changes surrounding the gallbladder and second portion of the duodenum with extension of the inflammatory fluid along the right paracolic gutter into the pelvis. An acute cholecystitis is not entirely excluded. Correlation with clinical exam and further evaluation with gallbladder ultrasound recommended. 2. No bowel obstruction. Normal appendix. Electronically Signed   By: Anner Crete M.D.   On: 06/05/2018 02:03   US Abdomen Limited Ruq  Result Date: 06/05/2018 CLINICAL DATA:  47 year old male with right upper quadrant abdominal pain. EXAM: ULTRASOUND ABDOMEN LIMITED RIGHT UPPER QUADRANT COMPARISON:  CT of the abdomen pelvis dated 06/05/2018 FINDINGS: Gallbladder: The gallbladder is filled with stones. There is no gallbladder wall thickening or pericholecystic fluid. Negative sonographic Murphy's sign. Common bile duct: Diameter: 8 mm Liver: The liver is unremarkable. Portal vein is patent on color Doppler imaging with normal direction of blood flow towards the liver. IMPRESSION: Cholelithiasis without sonographic evidence acute cholecystitis. Electronically Signed   By: Anner Crete M.D.   On: 06/05/2018 04:14    Review of Systems  Gastrointestinal: Positive for abdominal pain.  All other systems reviewed and are negative.   Blood  pressure (!) 151/97, pulse 99, temperature 98.2 F (36.8 C), temperature source Oral, resp. rate 16, SpO2 100 %. Physical Exam  Vitals reviewed. Constitutional: He is oriented to person, place, and time. He appears well-developed and well-nourished.  HENT:  Head: Normocephalic and atraumatic.  Eyes: No scleral icterus.  Neck: Neck supple.  Cardiovascular: Normal rate, regular rhythm and normal heart sounds.  Respiratory: Effort normal and breath sounds normal.  GI: Soft. Bowel sounds are normal. He exhibits no distension. There is tenderness in the right upper quadrant and epigastric area. No hernia.  Lymphadenopathy:    He has no cervical adenopathy.  Neurological: He is alert and oriented to person, place, and time.  Skin: Skin is warm and dry.     Assessment/Plan Cholecystitis I discussed the procedure in detail. We discussed the risks and benefits of a laparoscopic cholecystectomy and possible cholangiogram including, but not limited to bleeding, infection, injury to surrounding structures such as the intestine or liver, bile leak, retained gallstones, need to convert to an open procedure, prolonged diarrhea, blood clots such as  DVT, common bile duct injury, anesthesia risks, and possible need for additional procedures.  The likelihood of improvement in symptoms and return to the patient's normal status is good. We discussed the typical post-operative recovery course.  Rolm Bookbinder, MD 06/05/2018, 6:26 AM

## 2018-06-05 NOTE — Progress Notes (Signed)
Patient arrived to 6N10 A&Ox4, VSS, 2 RFA IV intact.  Noted to have 4 port sites with skin glue closure and JP drain present in RUQ.  Patient rates pain 2/10 and describes as just sore.  No family at bedside.  Patient oriented to room and equipment.  Will continue to monitor.

## 2018-06-05 NOTE — ED Notes (Signed)
Dr Sabra Heck informed of lactic acid results 2.06

## 2018-06-06 ENCOUNTER — Other Ambulatory Visit: Payer: Self-pay

## 2018-06-06 ENCOUNTER — Encounter (HOSPITAL_COMMUNITY): Payer: Self-pay | Admitting: Surgery

## 2018-06-06 LAB — COMPREHENSIVE METABOLIC PANEL
ALK PHOS: 40 U/L (ref 38–126)
ALT: 45 U/L — AB (ref 0–44)
AST: 47 U/L — AB (ref 15–41)
Albumin: 3 g/dL — ABNORMAL LOW (ref 3.5–5.0)
Anion gap: 10 (ref 5–15)
BUN: 10 mg/dL (ref 6–20)
CALCIUM: 8.3 mg/dL — AB (ref 8.9–10.3)
CO2: 27 mmol/L (ref 22–32)
Chloride: 100 mmol/L (ref 98–111)
Creatinine, Ser: 0.95 mg/dL (ref 0.61–1.24)
GFR calc Af Amer: >60 mL/min (ref >60)
GFR calc non Af Amer: >60 mL/min (ref >60)
Glucose, Bld: 83 mg/dL (ref 70–99)
Potassium: 3.1 mmol/L — ABNORMAL LOW (ref 3.5–5.1)
Sodium: 137 mmol/L (ref 135–145)
Total Bilirubin: 0.6 mg/dL (ref 0.3–1.2)
Total Protein: 5.7 g/dL — ABNORMAL LOW (ref 6.5–8.1)

## 2018-06-06 LAB — CBC
HCT: 35.1 % — ABNORMAL LOW (ref 39.0–52.0)
Hemoglobin: 11.4 g/dL — ABNORMAL LOW (ref 13.0–17.0)
MCH: 30 pg (ref 26.0–34.0)
MCHC: 32.5 g/dL (ref 30.0–36.0)
MCV: 92.4 fL (ref 80.0–100.0)
NRBC: 0 % (ref 0.0–0.2)
Platelets: 285 10*3/uL (ref 150–400)
RBC: 3.8 MIL/uL — AB (ref 4.22–5.81)
RDW: 13.6 % (ref 11.5–15.5)
WBC: 12.7 10*3/uL — ABNORMAL HIGH (ref 4.0–10.5)

## 2018-06-06 MED ORDER — IBUPROFEN 800 MG PO TABS: 800.0000 mg | ORAL_TABLET | Freq: Three times a day (TID) | ORAL | 0 refills | Status: DC | PRN

## 2018-06-06 MED ORDER — CIPROFLOXACIN HCL 500 MG PO TABS: 500.0000 mg | ORAL_TABLET | Freq: Two times a day (BID) | ORAL | 0 refills | Status: AC

## 2018-06-06 MED ORDER — TRAMADOL HCL 50 MG PO TABS: 50.0000 mg | ORAL_TABLET | Freq: Four times a day (QID) | ORAL | 0 refills | Status: DC | PRN

## 2018-06-06 MED ORDER — METRONIDAZOLE 500 MG PO TABS: 500.0000 mg | ORAL_TABLET | Freq: Three times a day (TID) | ORAL | 0 refills | Status: AC

## 2018-06-06 MED ORDER — ACETAMINOPHEN 325 MG PO TABS: 650.0000 mg | ORAL_TABLET | Freq: Four times a day (QID) | ORAL | Status: DC

## 2018-06-06 NOTE — Progress Notes (Signed)
[facility] Progress Note  1 Day Post-Op  Subjective: CC:  C/o some abdominal pain, worse with movement, controlled at rest. Tolerating regular diet. Mobilizing. Urinating without issue.   Objective: Vital signs in last 24 hours: Temp:  [97 F (36.1 C)-98.7 F (37.1 C)] 98.2 F (36.8 C) (12/09 1003) Pulse Rate:  [64-115] 79 (12/09 1003) Resp:  [17-22] 18 (12/09 1003) BP: (93-139)/(58-95) 109/71 (12/09 1003) SpO2:  [96 %-100 %] 99 % (12/09 1003) Weight:  [80.6 kg] 80.6 kg (12/08 1422) Last BM Date: 06/04/18  Intake/Output from previous day: 12/08 0701 - 12/09 0700 In: 3114.3 [P.O.:512; I.V.:1993.1; IV Piggyback:509.2] Out: 195 [Drains:185; Blood:10] Intake/Output this shift: Total I/O In: 360 [P.O.:360] Out: 40 [Drains:40]  PE: Gen:  Alert, NAD, pleasant Card:  Regular rate and rhythm Pulm:  Normal effort Abd: Soft, approp tender, non-distended, bowel sounds present in all 4 quadrants, incisions C/D/I JP drain with SS drainage. Skin: warm and dry, no rashes  Psych: A&Ox3   Lab Results:  Recent Labs    06/04/18 2342 06/06/18 0747  WBC 20.4* 12.7*  HGB 13.5 11.4*  HCT 39.9 35.1*  PLT 362 285   BMET Recent Labs    06/04/18 2342 06/06/18 0747  NA 138 137  K 2.8* 3.1*  CL 96* 100  CO2 24 27  GLUCOSE 148* 83  BUN 17 10  CREATININE 0.84 0.95  CALCIUM 9.6 8.3*   PT/INR No results for input(s): LABPROT, INR in the last 72 hours. CMP     Component Value Date/Time   NA 137 06/06/2018 0747   NA 144 07/08/2017 1106   K 3.1 (L) 06/06/2018 0747   CL 100 06/06/2018 0747   CO2 27 06/06/2018 0747   GLUCOSE 83 06/06/2018 0747   BUN 10 06/06/2018 0747   BUN 15 07/08/2017 1106   CREATININE 0.95 06/06/2018 0747   CREATININE 1.05 01/14/2016 0838   CALCIUM 8.3 (L) 06/06/2018 0747   PROT 5.7 (L) 06/06/2018 0747   ALBUMIN 3.0 (L) 06/06/2018 0747   AST 47 (H) 06/06/2018 0747   ALT 45 (H) 06/06/2018 0747   ALKPHOS 40 06/06/2018 0747   BILITOT 0.6  06/06/2018 0747   GFRNONAA >60 06/06/2018 0747   GFRNONAA 86 01/14/2016 0838   GFRAA >60 06/06/2018 0747   GFRAA >89 01/14/2016 0838   Lipase     Component Value Date/Time   LIPASE 41 06/04/2018 2342   Studies/Results: Ct Abdomen Pelvis W Contrast  Result Date: 06/05/2018 CLINICAL DATA:  47 year old male with abdominal pain. Concern for acute appendicitis. EXAM: CT ABDOMEN AND PELVIS WITH CONTRAST TECHNIQUE: Multidetector CT imaging of the abdomen and pelvis was performed using the standard protocol following bolus administration of intravenous contrast. CONTRAST:  145mL OMNIPAQUE IOHEXOL 300 MG/ML  SOLN COMPARISON:  None. FINDINGS: Lower chest: There are minimal bibasilar atelectatic changes of the lung bases. No intra-abdominal free air. There is small subhepatic free fluid as well as small amount of fluid within the pelvis. Hepatobiliary: The liver is unremarkable. No intrahepatic biliary ductal dilatation. There is minimal thickened appearance of the gallbladder wall. No definite calcified stone. A tiny focus of calcification in the porta hepatic (series 3, image 22) may represent vascular calcification. There is stranding of the fat surrounding the gallbladder and second portion of the duodenum with extension of the inflammatory fluid along the right paracolic gutter into the pelvis. An acute cholecystitis is not entirely excluded. Correlation with clinical exam and further evaluation with gallbladder ultrasound recommended. Pancreas: Unremarkable.  No pancreatic ductal dilatation or surrounding inflammatory changes. Spleen: Normal in size without focal abnormality. Adrenals/Urinary Tract: The adrenal glands are unremarkable. The kidneys, visualized ureters and urinary bladder appear unremarkable as well. Stomach/Bowel: Top-normal caliber loops of small bowel in the mid abdomen, likely reactive ileus. No evidence of bowel obstruction. The appendix is normal. Vascular/Lymphatic: No significant  vascular findings are present. No enlarged abdominal or pelvic lymph nodes. Reproductive: The prostate and seminal vesicles are grossly unremarkable. No pelvic mass. Other: None Musculoskeletal: No acute or significant osseous findings. IMPRESSION: 1. Inflammatory changes surrounding the gallbladder and second portion of the duodenum with extension of the inflammatory fluid along the right paracolic gutter into the pelvis. An acute cholecystitis is not entirely excluded. Correlation with clinical exam and further evaluation with gallbladder ultrasound recommended. 2. No bowel obstruction. Normal appendix. Electronically Signed   By: Anner Crete M.D.   On: 06/05/2018 02:03   US Abdomen Limited Ruq  Result Date: 06/05/2018 CLINICAL DATA:  47 year old male with right upper quadrant abdominal pain. EXAM: ULTRASOUND ABDOMEN LIMITED RIGHT UPPER QUADRANT COMPARISON:  CT of the abdomen pelvis dated 06/05/2018 FINDINGS: Gallbladder: The gallbladder is filled with stones. There is no gallbladder wall thickening or pericholecystic fluid. Negative sonographic Murphy's sign. Common bile duct: Diameter: 8 mm Liver: The liver is unremarkable. Portal vein is patent on color Doppler imaging with normal direction of blood flow towards the liver. IMPRESSION: Cholelithiasis without sonographic evidence acute cholecystitis. Electronically Signed   By: Anner Crete M.D.   On: 06/05/2018 04:14    Anti-infectives: Anti-infectives (From admission, onward)   Start     Dose/Rate Route Frequency Ordered Stop   06/05/18 0645  ciprofloxacin (CIPRO) IVPB 400 mg     400 mg 200 mL/hr over 60 Minutes Intravenous Every 12 hours 06/05/18 0639     06/05/18 0230  ciprofloxacin (CIPRO) IVPB 400 mg     400 mg 200 mL/hr over 60 Minutes Intravenous  Once 06/05/18 0219 06/05/18 0438   06/05/18 0230  metroNIDAZOLE (FLAGYL) IVPB 500 mg     500 mg 100 mL/hr over 60 Minutes Intravenous  Once 06/05/18 0219 06/05/18 0639      Assessment/Plan HTN - lisinopril/HCTZ Anxiety - xanax PRN  acute cholecystitis with bilious ascites consistent with perforated gallbladder  s/p laparoscopic cholecystectomy, placement 27F blake drain - POD#1 - afebrile, VSS, WBC 12.7 from 20  - drain ~150cc/24h  - tolerating regular diet  - transition to PO abx to complete a total of 7 days.  - mobilize/ IS  Stable for D/C home on PO abx. Continue drain. Pt to follow up in our office for drain removal followed by post-op appointment in [place] clinic.      LOS: 1 day    Obie Dredge, [facility] Surgery Pager: [phone]

## 2018-06-06 NOTE — Discharge Instructions (Signed)
[facility] °LAPAROSCOPIC SURGERY: POST OP INSTRUCTIONS °Always review your discharge instruction sheet given to you by the facility where your surgery was performed. °IF YOU HAVE DISABILITY OR FAMILY LEAVE FORMS, YOU MUST BRING THEM TO THE OFFICE FOR PROCESSING.   °DO NOT GIVE THEM TO YOUR DOCTOR. ° °PAIN CONTROL ° °1. First take acetaminophen (Tylenol) AND/or ibuprofen (Advil) to control your pain after surgery.  Follow directions on package.  Taking acetaminophen (Tylenol) and/or ibuprofen (Advil) regularly after surgery will help to control your pain and lower the amount of prescription pain medication you may need.  You should not take more than 3,000 mg (3 grams) of acetaminophen (Tylenol) in 24 hours.  You should not take ibuprofen (Advil), aleve, motrin, naprosyn or other NSAIDS if you have a history of stomach ulcers or chronic kidney disease.  °2. A prescription for pain medication may be given to you upon discharge.  Take your pain medication as prescribed, if you still have uncontrolled pain after taking acetaminophen (Tylenol) or ibuprofen (Advil). °3. Use ice packs to help control pain. °4. If you need a refill on your pain medication, please contact your pharmacy.  They will contact our office to request authorization. Prescriptions will not be filled after 5pm or on week-ends. ° °HOME MEDICATIONS °5. Take your usually prescribed medications unless otherwise directed. ° °DIET °6. You should follow a light diet the first few days after arrival home.  Be sure to include lots of fluids daily. Avoid fatty, fried foods.  ° °CONSTIPATION °7. It is common to experience some constipation after surgery and if you are taking pain medication.  Increasing fluid intake and taking a stool softener (such as Colace) will usually help or prevent this problem from occurring.  A mild laxative (Milk of Magnesia or Miralax) should be taken according to package instructions if there are no bowel  movements after 48 hours. ° °WOUND/INCISION CARE °8. Most patients will experience some swelling and bruising in the area of the incisions.  Ice packs will help.  Swelling and bruising can take several days to resolve.  °9. Unless discharge instructions indicate otherwise, follow guidelines below  °a. STERI-STRIPS - you may remove your outer bandages 48 hours after surgery, and you may shower at that time.  You have steri-strips (small skin tapes) in place directly over the incision.  These strips should be left on the skin for 7-10 days.   °b. DERMABOND/SKIN GLUE - you may shower in 24 hours.  The glue will flake off over the next 2-3 weeks. °10. Any sutures or staples will be removed at the office during your follow-up visit. ° °ACTIVITIES °11. You may resume regular (light) daily activities beginning the next day--such as daily self-care, walking, climbing stairs--gradually increasing activities as tolerated.  You may have sexual intercourse when it is comfortable.  Refrain from any heavy lifting or straining until approved by your doctor. °a. You may drive when you are no longer taking prescription pain medication, you can comfortably wear a seatbelt, and you can safely maneuver your car and apply brakes. ° °FOLLOW-UP °12. You should see your doctor in the office for a follow-up appointment approximately 2-3 weeks after your surgery.  You should have been given your post-op/follow-up appointment when your surgery was scheduled.  If you did not receive a post-op/follow-up appointment, make sure that you call for this appointment within a day or two after you arrive home to insure a convenient appointment time. ° °OTHER   INSTRUCTIONS °13.  ° °WHEN TO CALL YOUR DOCTOR: °1. Fever over 101.0 °2. Inability to urinate °3. Continued bleeding from incision. °4. Increased pain, redness, or drainage from the incision. °5. Increasing abdominal pain ° °The clinic staff is available to answer your questions during regular  business hours.  Please don’t hesitate to call and ask to speak to one of the nurses for clinical concerns.  If you have a medical emergency, go to the nearest emergency room or call 911.  A surgeon from [facility] is always on call at the hospital. °[address], [address], [place], [place]  [address] ? [address] [address], [place], [place]   [address] °[phone] ? [phone] ? FAX [phone] °Web site: [email or web address] ° °

## 2018-06-06 NOTE — Progress Notes (Signed)
Patient discharged to home. Verbalizes understanding of all discharge instructions including incision care, discharge medications, and follow up MD visits. Patient provided with dressing change supplies and JP drain log.

## 2018-06-07 NOTE — Discharge Summary (Addendum)
[facility] Discharge Summary   Patient ID: Donald Jenkins MRN: [id number] DOB/AGE: 1970/08/04 47 y.o.  Admit date: 06/04/2018 Discharge date: 06/06/2018  Discharge Diagnosis Patient Active Problem List   Diagnosis Date Noted  . Cholecystitis 06/05/2018  . Recurrent major depression in full remission ([place]) 04/05/2018  . Social anxiety disorder 03/18/2018  . Panic disorder 03/18/2018  . Essential hypertension, benign 06/06/2015   Consultants None   Imaging: No results found.  Procedures Dr. Romana Juniper (06/05/18) - Laparoscopic Cholecystectomy, placement 70F JP drain   HPI 67 yom with history of one episode three years ago RUQ pain who presented with ruq pain after eating fatty meal 24 h prior. Pain raidiated to epigastrium and also his pelvis.  This got worse yesterday and nothing at home made better.  No emesis. No fever  Hospital Course:  Workup showed cholecystitis - CT that shows a lot of inflammation related to gb and [place] with gallstones.  Wbc was 22k. Patient was admitted and underwent procedure listed above. Found to have bilious ascites consistent with perforated gallbladder. Tolerated procedure well and was transferred to the floor.  Diet was advanced as tolerated.  On POD#1, the patient was voiding well, tolerating diet, ambulating well, pain well controlled, vital signs stable, incisions c/d/i, drain serousanguinous and felt stable for discharge home.  Patient will follow up in our office in 5 days for drain removal and in 2 weeks for post-op follow up and knows to call with questions or concerns.  He will call to confirm appointment date/time.    Allergies as of 06/06/2018      Reactions   Penicillins Other (See Comments)   From childhood      Medication List    TAKE these medications   acetaminophen 325 MG tablet Commonly known as:  TYLENOL Take 2 tablets (650 mg total) by mouth every 6 (six) hours.   ALPRAZolam 1 MG tablet Commonly known as:   XANAX Take 1 tablet (1 mg total) by mouth 3 (three) times daily as needed. What changed:  reasons to take this   ciprofloxacin 500 MG tablet Commonly known as:  CIPRO Take 1 tablet (500 mg total) by mouth 2 (two) times daily for 5 days.   hydrOXYzine 25 MG tablet Commonly known as:  ATARAX/VISTARIL Take 1 tablet (25 mg total) by mouth at bedtime.   ibuprofen 800 MG tablet Commonly known as:  ADVIL,MOTRIN Take 1 tablet (800 mg total) by mouth every 8 (eight) hours as needed.   lisinopril-hydrochlorothiazide 10-12.5 MG tablet Commonly known as:  PRINZIDE,ZESTORETIC Take 1 tablet by mouth daily.   metroNIDAZOLE 500 MG tablet Commonly known as:  FLAGYL Take 1 tablet (500 mg total) by mouth 3 (three) times daily for 5 days.   montelukast 10 MG tablet Commonly known as:  SINGULAIR Take 1 tablet (10 mg total) by mouth at bedtime. What changed:  when to take this   POTASSIUM PO Take 1 tablet by mouth daily. Over the counter   sildenafil 50 MG tablet Commonly known as:  VIAGRA Take 1 tablet (50 mg total) by mouth daily as needed for erectile dysfunction. Office visit needed for additional refills. 1st notice.   traMADol 50 MG tablet Commonly known as:  ULTRAM Take 1 tablet (50 mg total) by mouth every 6 (six) hours as needed for moderate pain or severe pain.   zaleplon 10 MG capsule Commonly known as:  SONATA Take 1 capsule (10 mg total) by mouth at bedtime as  needed for sleep. Reported on 01/14/2016        Follow-up [facility], [place]. Go on 06/10/2018.   Specialty:  General Surgery Why:  at 2:00 PM for drain removal. call to confirm post-operative appointment date/time.  Contact information: [address] [place] [phone]          Signed: Obie Dredge, [facility] 06/07/2018, 1:21 PM

## 2018-09-18 ENCOUNTER — Other Ambulatory Visit: Payer: Self-pay | Admitting: Urgent Care

## 2018-09-18 DIAGNOSIS — I10 Essential (primary) hypertension: Secondary | ICD-10-CM

## 2018-09-22 ENCOUNTER — Other Ambulatory Visit: Payer: Self-pay | Admitting: Urgent Care

## 2018-09-22 DIAGNOSIS — I10 Essential (primary) hypertension: Secondary | ICD-10-CM

## 2018-09-22 NOTE — Telephone Encounter (Signed)
Requested medication (s) are due for refill today:yes  Requested medication (s) are on the active medication list: yes  Last refill:   Future visit scheduled:no  Notes to clinic:  Pt last seen 06/2017 by Jaynee Eagles; does not want to come into office due to covid-19    Requested Prescriptions  Pending Prescriptions Disp Refills   lisinopril-hydrochlorothiazide (PRINZIDE,ZESTORETIC) 10-12.5 MG tablet 90 tablet 3    Sig: Take 1 tablet by mouth daily.     Cardiovascular:  ACEI + Diuretic Combos Failed - 09/22/2018  8:49 AM      Failed - K in normal range and within 180 days    Potassium  Date Value Ref Range Status  06/06/2018 3.1 (L) 3.5 - 5.1 mmol/L Final         Failed - Ca in normal range and within 180 days    Calcium  Date Value Ref Range Status  06/06/2018 8.3 (L) 8.9 - 10.3 mg/dL Final         Failed - Valid encounter within last 6 months    Recent Outpatient Visits          1 year ago Essential hypertension, benign   [facility] at [place], [place]   2 years ago Essential hypertension   [facility] at [place], [place]   3 years ago Insect bites   Primary Care at [facility], Gay Filler, [place]   3 years ago Essential hypertension, benign   [facility], Janalee Dane, MD   3 years ago Knee pain, acute, right   Primary Care at [facility], Fenton Malling, MD             Passed - Na in normal range and within 180 days    Sodium  Date Value Ref Range Status  06/06/2018 137 135 - 145 mmol/L Final  07/08/2017 144 134 - 144 mmol/L Final         Passed - Cr in normal range and within 180 days    Creat  Date Value Ref Range Status  01/14/2016 1.05 0.60 - 1.35 mg/dL Final   Creatinine, Ser  Date Value Ref Range Status  06/06/2018 0.95 0.61 - 1.24 mg/dL Final         Passed - Patient is not pregnant      Passed - Last BP in normal range    BP Readings from Last 1 Encounters:  06/06/18 109/71

## 2018-09-22 NOTE — Telephone Encounter (Signed)
Patient does not want to come in for CPE because of COVID19 at this time

## 2018-09-26 ENCOUNTER — Telehealth: Payer: Self-pay | Admitting: Psychiatry

## 2018-09-26 ENCOUNTER — Encounter: Payer: Self-pay | Admitting: General Practice

## 2018-09-26 ENCOUNTER — Telehealth: Payer: Self-pay | Admitting: *Deleted

## 2018-09-26 DIAGNOSIS — F4010 Social phobia, unspecified: Secondary | ICD-10-CM

## 2018-09-26 DIAGNOSIS — F410 Panic disorder [episodic paroxysmal anxiety] without agoraphobia: Secondary | ICD-10-CM

## 2018-09-26 MED ORDER — LISINOPRIL-HYDROCHLOROTHIAZIDE 10-12.5 MG PO TABS: 1.0000 | ORAL_TABLET | Freq: Every day | ORAL | 0 refills | Status: DC

## 2018-09-26 NOTE — Telephone Encounter (Signed)
This encounter was created in error - please disregard.

## 2018-09-26 NOTE — Telephone Encounter (Signed)
Donald Jenkins has an appt. 10/04/18.  Wants to know if it is necessary to have this appt.  Can he push it out.  Knows it would be telehealth but thinks it may not be necessary.  Does need refills on his medications though.  Please advise if we can push appt out or does he need to have the appt.

## 2018-09-26 NOTE — Telephone Encounter (Signed)
Pt called to request medication refill of Lisinopril via voicemail message on 09/26/18 at 7:45 am. Medication filled on 09/26/18.

## 2018-09-26 NOTE — Telephone Encounter (Signed)
Voicemail messages left for patient as no answer to my return phone call clarifying the controlled substance mandates for Xanax and Sonata when he had stopped other medications at last appointment.  I can send a 30-day emergency supply of each if needs to defer the appointment due to the national coronavirus pandemic emergency.  We await his return message as to directions for the 10/04/2018 scheduled appointment and the prescriptions needed.

## 2018-09-27 MED ORDER — ALPRAZOLAM 1 MG PO TABS: 1.0000 mg | ORAL_TABLET | Freq: Three times a day (TID) | ORAL | 0 refills | Status: DC | PRN

## 2018-09-27 NOTE — Telephone Encounter (Signed)
[name] returns message that with coronavirus national pandemic emergency, he is canceling 10/04/2018 appointment to reschedule to early May.  With relaxation of regulatory requirements by CMS, FDA, and CDC, and the mandate for 14-month appointment not yet reaching that date fully, a month supply of Xanax 1 mg 3 times daily as needed for panic or anxiety is sent to [facility] medically necessary no contraindication also as emergency supply.

## 2018-09-27 NOTE — Telephone Encounter (Signed)
He would like a RF of Alprazolam sent to [facility]. He RS for early May.

## 2018-10-04 ENCOUNTER — Ambulatory Visit: Payer: BLUE CROSS/BLUE SHIELD | Admitting: Psychiatry

## 2018-10-20 ENCOUNTER — Other Ambulatory Visit: Payer: Self-pay | Admitting: Family Medicine

## 2018-10-20 DIAGNOSIS — I10 Essential (primary) hypertension: Secondary | ICD-10-CM

## 2018-10-20 NOTE — Telephone Encounter (Signed)
Requested Prescriptions  Pending Prescriptions Disp Refills  . lisinopril-hydrochlorothiazide (ZESTORETIC) 10-12.5 MG tablet [Pharmacy Med Name: LISINOPRIL-HCTZ 10-12.5 MG TAB] 7 tablet 0    Sig: TAKE 1 TABLET BY MOUTH EVERY DAY     Cardiovascular:  ACEI + Diuretic Combos Failed - 10/20/2018 11:32 AM      Failed - K in normal range and within 180 days    Potassium  Date Value Ref Range Status  06/06/2018 3.1 (L) 3.5 - 5.1 mmol/L Final         Failed - Ca in normal range and within 180 days    Calcium  Date Value Ref Range Status  06/06/2018 8.3 (L) 8.9 - 10.3 mg/dL Final         Failed - Valid encounter within last 6 months    Recent Outpatient Visits          1 year ago Essential hypertension, benign   Primary [facility] at [place], [place]   2 years ago Essential hypertension   Primary Care at [place], [place]   3 years ago Insect bites   Primary Care at [facility], Gay Filler, [place]   3 years ago Essential hypertension, benign   [facility], Janalee Dane, [place]   3 years ago Knee pain, acute, right   Primary Care at [facility], Fenton Malling, MD             Passed - Na in normal range and within 180 days    Sodium  Date Value Ref Range Status  06/06/2018 137 135 - 145 mmol/L Final  07/08/2017 144 134 - 144 mmol/L Final         Passed - Cr in normal range and within 180 days    Creat  Date Value Ref Range Status  01/14/2016 1.05 0.60 - 1.35 mg/dL Final   Creatinine, Ser  Date Value Ref Range Status  06/06/2018 0.95 0.61 - 1.24 mg/dL Final         Passed - Patient is not pregnant      Passed - Last BP in normal range    BP Readings from Last 1 Encounters:  06/06/18 109/71       . montelukast (SINGULAIR) 10 MG tablet [Pharmacy Med Name: MONTELUKAST SOD 10 MG TABLET] 7 tablet 0    Sig: TAKE 1 TABLET BY MOUTH EVERYDAY AT BEDTIME     Pulmonology:  Leukotriene Inhibitors Failed - 10/20/2018 12:21 PM      Failed - Valid encounter within  last 12 months    Recent Outpatient Visits          1 year ago Essential hypertension, benign   Primary [facility] at [place], [place]   2 years ago Essential hypertension   Primary Care at [place], [place]   3 years ago Insect bites   Primary Care at [facility], Gay Filler, MD   3 years ago Essential hypertension, benign   [facility], Janalee Dane, [place]   3 years ago Knee pain, acute, right   Primary Care at [facility], Fenton Malling, MD           Called pt. To get scheduled for TOC appt.; Courtesy refill given ; #7 tablets, until has Virtual Visit on 4/28

## 2018-10-20 NOTE — Telephone Encounter (Signed)
Called pt. And advised he will need to Transfer Care to another provider, since Jaynee Eagles is no longer with PCP.  Advised will need to make an appt. To continue refilling his medications.  Transferred call to Scheduler for an appt.  Pt. Agrees with plan.

## 2018-10-25 ENCOUNTER — Encounter: Payer: Self-pay | Admitting: Registered Nurse

## 2018-10-25 ENCOUNTER — Telehealth (INDEPENDENT_AMBULATORY_CARE_PROVIDER_SITE_OTHER): Payer: BC Managed Care – PPO | Admitting: Registered Nurse

## 2018-10-25 ENCOUNTER — Other Ambulatory Visit: Payer: Self-pay

## 2018-10-25 DIAGNOSIS — Z7689 Persons encountering health services in other specified circumstances: Secondary | ICD-10-CM | POA: Diagnosis not present

## 2018-10-25 DIAGNOSIS — R0602 Shortness of breath: Secondary | ICD-10-CM

## 2018-10-25 DIAGNOSIS — I10 Essential (primary) hypertension: Secondary | ICD-10-CM

## 2018-10-25 MED ORDER — LISINOPRIL-HYDROCHLOROTHIAZIDE 10-12.5 MG PO TABS: 1.0000 | ORAL_TABLET | Freq: Every day | ORAL | 0 refills | Status: DC

## 2018-10-25 MED ORDER — MONTELUKAST SODIUM 10 MG PO TABS: ORAL_TABLET | ORAL | 0 refills | Status: DC

## 2018-10-25 NOTE — Progress Notes (Signed)
Telemedicine Encounter- SOAP NOTE Established Patient  This telephone encounter was conducted with the patient's (or proxy's) verbal consent via audio telecommunications: yes   Patient was instructed to have this encounter in a suitably private space; and to only have persons present to whom they give permission to participate. In addition, patient identity was confirmed by use of name plus two identifiers (DOB and address).  I discussed the limitations, risks, security and privacy concerns of performing an evaluation and management service by telephone and the availability of in person appointments. I also discussed with the patient that there may be a patient responsible charge related to this service. The patient expressed understanding and agreed to proceed.  I spent a total of 15 minutes talking with the patient or their proxy.  CC: Establish Care, Med Refill  Subjective   Donald Jenkins is a 48 y.o. former patient of Jaynee Eagles, [place]. Telephone visit today for refills of lisinopril-hydrochlorothiazide and singulair.   HTN: well managed with lisinopril-hydrochlorothiazide 10-12.5mg . Will reorder.  Singulair: Pt reports that nasal sprays were not effective with his nighttime sinus congestion and shortness of breath, and when switched to Singulair, he noticed immediate relief. Will reorder.  History significant for Social Anxiety, MDD, Cholecystectomy (2020).    Patient Active Problem List   Diagnosis Date Noted  . Cholecystitis 06/05/2018  . Recurrent major depression in full remission ([place]) 04/05/2018  . Social anxiety disorder 03/18/2018  . Panic disorder 03/18/2018  . Essential hypertension, benign 06/06/2015    Past Medical History:  Diagnosis Date  . Anxiety   . Hypertension   . Tachycardia     Current Outpatient Medications  Medication Sig Dispense Refill  . ALPRAZolam (XANAX) 1 MG tablet Take 1 tablet (1 mg total) by mouth 3 (three) times daily as needed  for anxiety. 90 tablet 0  . ibuprofen (ADVIL,MOTRIN) 800 MG tablet Take 1 tablet (800 mg total) by mouth every 8 (eight) hours as needed. 30 tablet 0  . lisinopril-hydrochlorothiazide (ZESTORETIC) 10-12.5 MG tablet Take 1 tablet by mouth daily. 7 tablet 0  . montelukast (SINGULAIR) 10 MG tablet TAKE 1 TABLET BY MOUTH EVERYDAY AT BEDTIME 7 tablet 0  . POTASSIUM PO Take 1 tablet by mouth daily. Over the counter    . zaleplon (SONATA) 10 MG capsule Take 1 capsule (10 mg total) by mouth at bedtime as needed for sleep. Reported on 01/14/2016 90 capsule 1   No current facility-administered medications for this visit.     Allergies  Allergen Reactions  . Penicillins Other (See Comments)    From childhood    Social History   Socioeconomic History  . Marital status: Single    Spouse name: Not on file  . Number of children: 0  . Years of education: Not on file  . Highest education level: Not on file  Occupational History  . Occupation: Arts administrator: [employer]  . Financial resource strain: Not hard at all  . Food insecurity:    Worry: Never true    Inability: Never true  . Transportation needs:    Medical: No    Non-medical: No  Tobacco Use  . Smoking status: Never Smoker  . Smokeless tobacco: Never Used  Substance and Sexual Activity  . Alcohol use: Yes    Alcohol/week: 0.0 standard drinks    Comment: socially, "once in a blue moon"  . Drug use: No  . Sexual activity: Yes  Partners: Female    Birth control/protection: None  Lifestyle  . Physical activity:    Days per week: 3 days    Minutes per session: 40 min  . Stress: To some extent  Relationships  . Social connections:    Talks on phone: Once a week    Gets together: Once a week    Attends religious service: Never    Active member of club or organization: No    Attends meetings of clubs or organizations: Never    Relationship status: Never married  . Intimate partner violence:    Fear of  current or ex partner: No    Emotionally abused: No    Physically abused: No    Forced sexual activity: No  Other Topics Concern  . 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  HENT: Negative for congestion and sore throat.   Respiratory: Negative for cough and shortness of breath.   Cardiovascular: Negative for chest pain.  Gastrointestinal: Negative for constipation, diarrhea, nausea and vomiting.    Objective   Vitals as reported by the patient: There were no vitals filed for this visit.  Diagnoses and all orders for this visit:  Encounter to establish care  Essential hypertension, benign -     lisinopril-hydrochlorothiazide (ZESTORETIC) 10-12.5 MG tablet; Take 1 tablet by mouth daily.  Shortness of breath Comments: at night d/t some sinus congestion. Low risk for COVID Orders: -     montelukast (SINGULAIR) 10 MG tablet; TAKE 1 TABLET BY MOUTH EVERYDAY AT BEDTIME  PLAN:  Continue on medications as prescribed  Encouraged pt to make appt for physical exam in 4 months or post COVID-19  Encouraged patient to call clinic with any questions, comments, or concerns.   I discussed the assessment and treatment plan with the patient. The patient was provided an opportunity to ask questions and all were answered. The patient agreed with the plan and demonstrated an understanding of the instructions.   The patient was advised to call back or seek an in-person evaluation if the symptoms worsen or if the condition fails to improve as anticipated.  I provided 15 minutes of non-face-to-face time during this encounter.  Maximiano Coss, NP  Primary Care at [facility]

## 2018-10-25 NOTE — Progress Notes (Signed)
Spoke with pt and he states he needs to follow-up for his Hypertension. Pt states he also need refills on medication Lisinopril and Singulair. Need new PCP to manage Hypertension.

## 2018-10-31 ENCOUNTER — Encounter: Payer: Self-pay | Admitting: *Deleted

## 2018-10-31 ENCOUNTER — Other Ambulatory Visit: Payer: Self-pay | Admitting: *Deleted

## 2018-10-31 ENCOUNTER — Telehealth: Payer: Self-pay | Admitting: Registered Nurse

## 2018-10-31 DIAGNOSIS — I10 Essential (primary) hypertension: Secondary | ICD-10-CM

## 2018-10-31 DIAGNOSIS — R0602 Shortness of breath: Secondary | ICD-10-CM

## 2018-10-31 MED ORDER — MONTELUKAST SODIUM 10 MG PO TABS: ORAL_TABLET | ORAL | 4 refills | Status: DC

## 2018-10-31 MED ORDER — LISINOPRIL-HYDROCHLOROTHIAZIDE 10-12.5 MG PO TABS: 1.0000 | ORAL_TABLET | Freq: Every day | ORAL | 4 refills | Status: DC

## 2018-10-31 NOTE — Telephone Encounter (Signed)
Correct prescription sent in.  4 months per Kathrin Ruddy .   Please schedule appointment before these run out.

## 2018-10-31 NOTE — Telephone Encounter (Unsigned)
Copied from [place] [phone]. Topic: Quick Communication - Rx Refill/Question >> Oct 31, 2018  8:24 AM Celene Kras A wrote: Medication: montelukast (SINGULAIR) 10 MG tablet & lisinopril-hydrochlorothiazide (ZESTORETIC) 10-12.5 MG tablet   Has the patient contacted their pharmacy? Yes.  Pt called stating only 7 tablets were sent in for both medications. Pt states the virtual visit last week was solely to get his medications filled so he is a little confused. Please contact pt and advise.  (Agent: If no, request that the patient contact the pharmacy for the refill.) (Agent: If yes, when and what did the pharmacy advise?)  Preferred Pharmacy (with phone number or street name): [id number] [address], [place] [address] Phone: [phone] Fax: [phone] Not a 24 hour pharmacy; exact hours not known.    Agent: Please be advised that RX refills may take up to 3 business days. We ask that you follow-up with your pharmacy.

## 2018-11-01 ENCOUNTER — Encounter: Payer: Self-pay | Admitting: Psychiatry

## 2018-11-01 ENCOUNTER — Other Ambulatory Visit: Payer: Self-pay | Admitting: Psychiatry

## 2018-11-01 ENCOUNTER — Other Ambulatory Visit: Payer: Self-pay

## 2018-11-01 ENCOUNTER — Ambulatory Visit (INDEPENDENT_AMBULATORY_CARE_PROVIDER_SITE_OTHER): Payer: BLUE CROSS/BLUE SHIELD | Admitting: Psychiatry

## 2018-11-01 DIAGNOSIS — F5113 Hypersomnia due to other mental disorder: Secondary | ICD-10-CM

## 2018-11-01 DIAGNOSIS — F99 Mental disorder, not otherwise specified: Secondary | ICD-10-CM

## 2018-11-01 DIAGNOSIS — F4010 Social phobia, unspecified: Secondary | ICD-10-CM

## 2018-11-01 DIAGNOSIS — F410 Panic disorder [episodic paroxysmal anxiety] without agoraphobia: Secondary | ICD-10-CM

## 2018-11-01 DIAGNOSIS — F3342 Major depressive disorder, recurrent, in full remission: Secondary | ICD-10-CM | POA: Diagnosis not present

## 2018-11-01 DIAGNOSIS — G4710 Hypersomnia, unspecified: Secondary | ICD-10-CM | POA: Insufficient documentation

## 2018-11-01 MED ORDER — ZALEPLON 10 MG PO CAPS: 10.0000 mg | ORAL_CAPSULE | Freq: Every evening | ORAL | 1 refills | Status: DC | PRN

## 2018-11-01 MED ORDER — ALPRAZOLAM 1 MG PO TABS: 1.0000 mg | ORAL_TABLET | Freq: Three times a day (TID) | ORAL | 1 refills | Status: DC | PRN

## 2018-11-01 MED ORDER — ARMODAFINIL 150 MG PO TABS: 150.0000 mg | ORAL_TABLET | Freq: Every day | ORAL | 1 refills | Status: DC

## 2018-11-01 NOTE — Progress Notes (Signed)
[facility]  Patient ID: Donald Jenkins,  MRN: [id number]  PCP: Maximiano Coss, NP  Date of Evaluation: 11/01/2018 Time spent:20 minutes from 1020 to 1040  Chief Complaint:  Chief Complaint    Anxiety; Panic Attack; Depression      HISTORY/CURRENT STATUS: Donald Jenkins is provided telemedicine audiovisual appointment session, declining camera for social anxiety, with consent not collateral for psychiatric interview and exam in 85-month evaluation and management of social and panic anxiety and recurrent depression in remission as he has discontinued Lamictal and Remeron last fall with a decline in weight of 20 pounds.  He continues to attribute improved self-concept and social confidence to his relationship with girlfriend from [place] though they are taking a couple of months break from each other during the coronavirus pandemic which he interprets will bring them closer or keep them just friends at a distance.  He delayed his appointment 1-1/2 months in March considering that he was doing well requesting interim Xanax which he continues along with Sonata.  However his chief concern today is difficulty with focus and alertness through the course of the day at work, though he gives no academic or behavioral history suggestive of ADHD.  The continued need for Sonata at night and the emergence of focus symptoms as depression and anxiety are better may suggest underlying hypersomnia disorder.  He can predominantly behaviorally abort pre-panic as heart rate begins to escalate though he still needs Xanax as well to prevent and relieve anxiety.  He does not find either medication excessive, though he also takes medications for high blood pressure and Viagra as needed.  Options are reviewed relative to past and current care as BuSpar and Nuvigil, doubting that stimulant unless Ritalin IR would be tolerated relative to panic.  He has no psychosis, mania, dissociation or delirium.  Depression          This is a recurrent problem.  The current episode started more than 1 year ago.   The onset quality is sudden.   The problem occurs intermittently.  The problem has been resolved since onset.  Associated symptoms include decreased concentration, fatigue and insomnia.  Associated symptoms include not irritable, no decreased interest, no body aches, no headaches, not sad and no suicidal ideas.     The symptoms are aggravated by work stress and social issues.  Past treatments include SSRIs - Selective serotonin reuptake inhibitors and other medications.   Individual Medical History/ Review of Systems: Changes? :Yes Chronic treatment with lisinopril hydrochlorothiazide and potassium for hypertension.  He also takes Viagra as needed for erectile dysfunction  Allergies: Penicillins  Current Medications:  Current Outpatient Medications:  .  ALPRAZolam (XANAX) 1 MG tablet, Take 1 tablet (1 mg total) by mouth 3 (three) times daily as needed for anxiety., Disp: 90 tablet, Rfl: 0 .  ibuprofen (ADVIL,MOTRIN) 800 MG tablet, Take 1 tablet (800 mg total) by mouth every 8 (eight) hours as needed., Disp: 30 tablet, Rfl: 0 .  lisinopril-hydrochlorothiazide (ZESTORETIC) 10-12.5 MG tablet, Take 1 tablet by mouth daily., Disp: 30 tablet, Rfl: 4 .  montelukast (SINGULAIR) 10 MG tablet, TAKE 1 TABLET BY MOUTH EVERYDAY AT BEDTIME, Disp: 30 tablet, Rfl: 4 .  POTASSIUM PO, Take 1 tablet by mouth daily. Over the counter, Disp: , Rfl:  .  zaleplon (SONATA) 10 MG capsule, Take 1 capsule (10 mg total) by mouth at bedtime as needed for sleep. Reported on 01/14/2016, Disp: 90 capsule, Rfl: 1   Medication Side Effects:diurnal fatigue/weakness and hypersomnolence  with poor focus and nocturnal insomnia are more likely primary symptoms than side effects.  Family Medical/ Social History: Changes? No  MENTAL HEALTH EXAM:  There were no vitals taken for this visit.There is no height or weight on file to calculate BMI.  As not  present here today.  General Appearance: N/A  Eye Contact:  N/A  Speech:  Clear and Coherent, Normal Rate and Talkative  Volume:  Normal  Mood:  Anxious and Euthymic  Affect:  Full Range and Anxious  Thought Process:  Disorganized, Irrelevant and Linear  Orientation:  Full (Time, Place, and Person)  Thought Content: Obsessions and Rumination   Suicidal Thoughts:  No  Homicidal Thoughts:  No  Memory:  Immediate;   Good Remote;   Good  Judgement:  Good  Insight:  Fair  Psychomotor Activity:  Normal, Decreased and Mannerisms  Concentration:  Concentration: Fair and Attention Span: Poor  Recall:  [place] of Knowledge: Good  Language: Good  Assets:  Desire for Improvement Leisure Time Talents/Skills  ADL's:  Intact  Cognition: WNL  Prognosis:  Good    DIAGNOSES:    ICD-10-CM   1. Social anxiety disorder F40.10   2. Panic disorder F41.0   3. Recurrent major depression in full remission ([place]) F33.42   4. Hypersomnolence disorder, with mental disorder, acute, moderate G47.10    F99     Receiving Psychotherapy: No    RECOMMENDATIONS: Referral for sleep study is primary care option though patient is busy with work, important relationship with girlfriend, and realizing his progress to the future.  The interference of poor focus and concentration with some fatigue sleepiness in the day while having insomnia at night, differential includes parasomnia disorder which may best be treated with armodafinil and buspirone anxiety is reasonably controlled currently.  He is E scribed to continue Xanax 1 mg 3 times daily as needed for panic or social anxiety #270 with 1 refill sent to [facility] [address] for panic and social anxiety orders.  He is E scribed Sonata 10 mg every bedtime as needed as #90 with 1 refill sent to [facility] [address] for insomnia of anxiety and depression.  He is E scribed dividual 150 mg every morning #90 with 1 refill for hypersomnia disorder [facility] [address].  Return  appointment is in 6 months or sooner if needed.  Virtual Visit via Video Note  I connected with [name] on 11/01/18 at 10:20 AM EDT by a video enabled telemedicine application and verified that I am speaking with the correct person using two identifiers.  Location: Patient: Individually at home residence Provider: [facility]   I discussed the limitations of evaluation and management by telemedicine and the availability of in person appointments. The patient expressed understanding and agreed to proceed.  History of Present Illness: 3-month evaluation and management address social and panic anxiety and recurrent depression in remission as he has discontinued Lamictal and Remeron last fall with a decline in weight of 20 pounds.  He continues to attribute improved self-concept and social confidence to his relationship with girlfriend from [place] though they are taking a couple of months break from each other during the coronavirus pandemic which he interprets will bring them closer or keep them just friends at a distance.  He delayed his appointment 1-1/2 months in March considering that he was doing well requesting interim Xanax which he continues along with Sonata.  However his chief concern today is difficulty with focus and  alertness through the course of the day at work.    Observations/Objective: Psychomotor Activity:  Normal, Decreased and Mannerisms  Concentration:  Concentration: Fair and Attention Span: Poor  Recall:  Fair   Assessment and Plan: Referral for sleep study is primary care option though patient is busy with work, important relationship with girlfriend, and realizing his progress to the future.  The interference of poor focus and concentration with some fatigue sleepiness in the day while having insomnia at night, differential includes parasomnia disorder which may best be treated with armodafinil and buspirone anxiety is reasonably controlled  currently.  He is E scribed to continue Xanax 1 mg 3 times daily as needed for panic or social anxiety #270 with 1 [facility] [address] for panic and social anxiety orders.  He is E scribed Sonata 10 mg every bedtime as needed as #90 with 1 refill [facility] [address] for insomnia of anxiety and depression.  He is E scribed dividual 150 mg every morning #90 with 1 refill for hypersomnia disorder [facility] [address].  Follow Up Instructions: .  Return appointment is in 6 months or sooner if needed.    I discussed the assessment and treatment plan with the patient. The patient was provided an opportunity to ask questions and all were answered. The patient agreed with the plan and demonstrated an understanding of the instructions.   The patient was advised to call back or seek an in-person evaluation if the symptoms worsen or if the condition fails to improve as anticipated.  I provided 20 minutes of non-face-to-face time during this encounter.   Delight Hoh, MD  Delight Hoh, MD

## 2018-11-15 ENCOUNTER — Telehealth: Payer: Self-pay | Admitting: Psychiatry

## 2018-11-15 DIAGNOSIS — F5113 Hypersomnia due to other mental disorder: Secondary | ICD-10-CM

## 2018-11-15 DIAGNOSIS — F4010 Social phobia, unspecified: Secondary | ICD-10-CM

## 2018-11-15 DIAGNOSIS — F410 Panic disorder [episodic paroxysmal anxiety] without agoraphobia: Secondary | ICD-10-CM

## 2018-11-15 MED ORDER — BUSPIRONE HCL 15 MG PO TABS: 15.0000 mg | ORAL_TABLET | Freq: Two times a day (BID) | ORAL | 0 refills | Status: DC

## 2018-11-15 NOTE — Telephone Encounter (Signed)
Patient phones that armodafinil is too expensive at $150 a month continued therefore to be replaced by buspirone 15 mg initially as 1 pill twice daily morning and midafternoon to possibly need titration for activation relative to hypersomnolence and anxiety level sent as #180 but can be filled as a 30-day supply of prefers with no refill to [facility] [address] leaving patient a message and return is no answer at his phone.

## 2018-11-15 NOTE — Addendum Note (Signed)
Addended by: Delight Hoh on: 11/15/2018 02:43 PM   Modules accepted: Orders

## 2018-11-15 NOTE — Telephone Encounter (Signed)
Patient left vm today @9 :16 stated the medication that was prescribed for focus and attention cost $160 he cannot afford this medication would like to know if there is something else he can take.

## 2018-11-16 ENCOUNTER — Telehealth: Payer: Self-pay | Admitting: Psychiatry

## 2018-11-16 DIAGNOSIS — F5113 Hypersomnia due to other mental disorder: Secondary | ICD-10-CM

## 2018-11-16 MED ORDER — METHYLPHENIDATE HCL 5 MG PO TABS: 5.0000 mg | ORAL_TABLET | Freq: Three times a day (TID) | ORAL | 0 refills | Status: DC

## 2018-11-16 NOTE — Telephone Encounter (Signed)
Patient called and said that the buspar you prescribed is something he can't take. He has taken it in the past and it made him worse. He is willing to try something else to help him focus that is not as expensive

## 2018-11-16 NOTE — Telephone Encounter (Signed)
Patient refuses BuSpar stating it caused him to have crazy thoughts, though he wants an inexpensive stimulant still having fatigue and somnolence likely part of hypersomnolence disorder.  Ritalin 5 mg 3 times daily #90 with no refill is sent to [facility] [address] with warning that the Ritalin may have side effect of the crazy thinking even more than the BuSpar.

## 2018-12-07 ENCOUNTER — Encounter (HOSPITAL_COMMUNITY): Payer: Self-pay | Admitting: Emergency Medicine

## 2018-12-07 ENCOUNTER — Other Ambulatory Visit: Payer: Self-pay

## 2018-12-07 ENCOUNTER — Inpatient Hospital Stay (HOSPITAL_COMMUNITY)
Admission: EM | Admit: 2018-12-07 | Discharge: 2018-12-15 | DRG: 477 | Disposition: A | Discharge status: Home / Self Care | Payer: BC Managed Care – PPO | Attending: Internal Medicine | Admitting: Internal Medicine

## 2018-12-07 ENCOUNTER — Emergency Department (HOSPITAL_COMMUNITY): Payer: BC Managed Care – PPO

## 2018-12-07 DIAGNOSIS — F410 Panic disorder [episodic paroxysmal anxiety] without agoraphobia: Secondary | ICD-10-CM | POA: Diagnosis present

## 2018-12-07 DIAGNOSIS — M549 Dorsalgia, unspecified: Secondary | ICD-10-CM

## 2018-12-07 DIAGNOSIS — R7989 Other specified abnormal findings of blood chemistry: Secondary | ICD-10-CM | POA: Diagnosis present

## 2018-12-07 DIAGNOSIS — S22000A Wedge compression fracture of unspecified thoracic vertebra, initial encounter for closed fracture: Secondary | ICD-10-CM

## 2018-12-07 DIAGNOSIS — W08XXXA Fall from other furniture, initial encounter: Secondary | ICD-10-CM | POA: Diagnosis present

## 2018-12-07 DIAGNOSIS — R6510 Systemic inflammatory response syndrome (SIRS) of non-infectious origin without acute organ dysfunction: Secondary | ICD-10-CM

## 2018-12-07 DIAGNOSIS — Z801 Family history of malignant neoplasm of trachea, bronchus and lung: Secondary | ICD-10-CM

## 2018-12-07 DIAGNOSIS — F418 Other specified anxiety disorders: Secondary | ICD-10-CM | POA: Diagnosis not present

## 2018-12-07 DIAGNOSIS — E162 Hypoglycemia, unspecified: Secondary | ICD-10-CM | POA: Diagnosis not present

## 2018-12-07 DIAGNOSIS — G9341 Metabolic encephalopathy: Secondary | ICD-10-CM | POA: Diagnosis present

## 2018-12-07 DIAGNOSIS — F1290 Cannabis use, unspecified, uncomplicated: Secondary | ICD-10-CM | POA: Diagnosis present

## 2018-12-07 DIAGNOSIS — R945 Abnormal results of liver function studies: Secondary | ICD-10-CM | POA: Diagnosis not present

## 2018-12-07 DIAGNOSIS — I10 Essential (primary) hypertension: Secondary | ICD-10-CM | POA: Diagnosis not present

## 2018-12-07 DIAGNOSIS — K5900 Constipation, unspecified: Secondary | ICD-10-CM | POA: Diagnosis not present

## 2018-12-07 DIAGNOSIS — F99 Mental disorder, not otherwise specified: Secondary | ICD-10-CM

## 2018-12-07 DIAGNOSIS — Z7951 Long term (current) use of inhaled steroids: Secondary | ICD-10-CM

## 2018-12-07 DIAGNOSIS — J45909 Unspecified asthma, uncomplicated: Secondary | ICD-10-CM | POA: Diagnosis present

## 2018-12-07 DIAGNOSIS — N179 Acute kidney failure, unspecified: Secondary | ICD-10-CM

## 2018-12-07 DIAGNOSIS — Z79899 Other long term (current) drug therapy: Secondary | ICD-10-CM

## 2018-12-07 DIAGNOSIS — R778 Other specified abnormalities of plasma proteins: Secondary | ICD-10-CM | POA: Diagnosis present

## 2018-12-07 DIAGNOSIS — E872 Acidosis, unspecified: Secondary | ICD-10-CM | POA: Diagnosis present

## 2018-12-07 DIAGNOSIS — R5381 Other malaise: Secondary | ICD-10-CM | POA: Diagnosis present

## 2018-12-07 DIAGNOSIS — Y92009 Unspecified place in unspecified non-institutional (private) residence as the place of occurrence of the external cause: Secondary | ICD-10-CM

## 2018-12-07 DIAGNOSIS — M4854XA Collapsed vertebra, not elsewhere classified, thoracic region, initial encounter for fracture: Secondary | ICD-10-CM | POA: Diagnosis present

## 2018-12-07 DIAGNOSIS — F329 Major depressive disorder, single episode, unspecified: Secondary | ICD-10-CM | POA: Diagnosis present

## 2018-12-07 DIAGNOSIS — M6282 Rhabdomyolysis: Secondary | ICD-10-CM | POA: Diagnosis not present

## 2018-12-07 DIAGNOSIS — Z880 Allergy status to penicillin: Secondary | ICD-10-CM

## 2018-12-07 DIAGNOSIS — M4804 Spinal stenosis, thoracic region: Secondary | ICD-10-CM | POA: Diagnosis present

## 2018-12-07 DIAGNOSIS — F32A Depression, unspecified: Secondary | ICD-10-CM | POA: Diagnosis present

## 2018-12-07 DIAGNOSIS — Z791 Long term (current) use of non-steroidal anti-inflammatories (NSAID): Secondary | ICD-10-CM

## 2018-12-07 DIAGNOSIS — Z808 Family history of malignant neoplasm of other organs or systems: Secondary | ICD-10-CM

## 2018-12-07 DIAGNOSIS — Z9049 Acquired absence of other specified parts of digestive tract: Secondary | ICD-10-CM

## 2018-12-07 DIAGNOSIS — Z1159 Encounter for screening for other viral diseases: Secondary | ICD-10-CM

## 2018-12-07 DIAGNOSIS — I248 Other forms of acute ischemic heart disease: Secondary | ICD-10-CM | POA: Diagnosis present

## 2018-12-07 DIAGNOSIS — Z800 Family history of malignant neoplasm of digestive organs: Secondary | ICD-10-CM

## 2018-12-07 DIAGNOSIS — R7881 Bacteremia: Secondary | ICD-10-CM | POA: Diagnosis present

## 2018-12-07 DIAGNOSIS — M47814 Spondylosis without myelopathy or radiculopathy, thoracic region: Secondary | ICD-10-CM | POA: Diagnosis present

## 2018-12-07 DIAGNOSIS — G4710 Hypersomnia, unspecified: Secondary | ICD-10-CM

## 2018-12-07 DIAGNOSIS — B955 Unspecified streptococcus as the cause of diseases classified elsewhere: Secondary | ICD-10-CM | POA: Diagnosis present

## 2018-12-07 DIAGNOSIS — E876 Hypokalemia: Secondary | ICD-10-CM | POA: Diagnosis not present

## 2018-12-07 DIAGNOSIS — W19XXXA Unspecified fall, initial encounter: Secondary | ICD-10-CM

## 2018-12-07 DIAGNOSIS — F419 Anxiety disorder, unspecified: Secondary | ICD-10-CM | POA: Diagnosis present

## 2018-12-07 LAB — URINALYSIS, ROUTINE W REFLEX MICROSCOPIC
Bilirubin Urine: NEGATIVE
Glucose, UA: NEGATIVE mg/dL
Ketones, ur: 5 mg/dL — AB
Leukocytes,Ua: NEGATIVE
Nitrite: NEGATIVE
Protein, ur: 100 mg/dL — AB
Specific Gravity, Urine: 1.016 (ref 1.005–1.030)
pH: 5 (ref 5.0–8.0)

## 2018-12-07 LAB — COMPREHENSIVE METABOLIC PANEL
ALT: 140 U/L — ABNORMAL HIGH (ref 0–44)
AST: 684 U/L — ABNORMAL HIGH (ref 15–41)
Albumin: 4.7 g/dL (ref 3.5–5.0)
Alkaline Phosphatase: 68 U/L (ref 38–126)
Anion gap: 18 — ABNORMAL HIGH (ref 5–15)
BUN: 55 mg/dL — ABNORMAL HIGH (ref 6–20)
CO2: 19 mmol/L — ABNORMAL LOW (ref 22–32)
Calcium: 9.4 mg/dL (ref 8.9–10.3)
Chloride: 101 mmol/L (ref 98–111)
Creatinine, Ser: 2.8 mg/dL — ABNORMAL HIGH (ref 0.61–1.24)
GFR calc Af Amer: 30 mL/min — ABNORMAL LOW (ref >60)
GFR calc non Af Amer: 26 mL/min — ABNORMAL LOW (ref >60)
Glucose, Bld: 113 mg/dL — ABNORMAL HIGH (ref 70–99)
Potassium: 4.3 mmol/L (ref 3.5–5.1)
Sodium: 138 mmol/L (ref 135–145)
Total Bilirubin: 1.2 mg/dL (ref 0.3–1.2)
Total Protein: 8.2 g/dL — ABNORMAL HIGH (ref 6.5–8.1)

## 2018-12-07 LAB — RAPID URINE DRUG SCREEN, HOSP PERFORMED
Amphetamines: NOT DETECTED
Barbiturates: NOT DETECTED
Benzodiazepines: NOT DETECTED
Cocaine: NOT DETECTED
Opiates: NOT DETECTED
Tetrahydrocannabinol: POSITIVE — AB

## 2018-12-07 LAB — CK: Total CK: >50000 U/L — ABNORMAL HIGH (ref 49–397)

## 2018-12-07 LAB — CBC WITH DIFFERENTIAL/PLATELET
Abs Immature Granulocytes: 0.13 10*3/uL — ABNORMAL HIGH (ref 0.00–0.07)
Basophils Absolute: 0 10*3/uL (ref 0.0–0.1)
Basophils Relative: 0 %
Eosinophils Absolute: 0 10*3/uL (ref 0.0–0.5)
Eosinophils Relative: 0 %
HCT: 48.7 % (ref 39.0–52.0)
Hemoglobin: 17.1 g/dL — ABNORMAL HIGH (ref 13.0–17.0)
Immature Granulocytes: 1 %
Lymphocytes Relative: 4 %
Lymphs Abs: 0.8 10*3/uL (ref 0.7–4.0)
MCH: 31.3 pg (ref 26.0–34.0)
MCHC: 35.1 g/dL (ref 30.0–36.0)
MCV: 89 fL (ref 80.0–100.0)
Monocytes Absolute: 1.7 10*3/uL — ABNORMAL HIGH (ref 0.1–1.0)
Monocytes Relative: 7 %
Neutro Abs: 20.8 10*3/uL — ABNORMAL HIGH (ref 1.7–7.7)
Neutrophils Relative %: 88 %
Platelets: 293 10*3/uL (ref 150–400)
RBC: 5.47 MIL/uL (ref 4.22–5.81)
RDW: 12.4 % (ref 11.5–15.5)
WBC: 23.5 10*3/uL — ABNORMAL HIGH (ref 4.0–10.5)
nRBC: 0 % (ref 0.0–0.2)

## 2018-12-07 LAB — SARS CORONAVIRUS 2 BY RT PCR (HOSPITAL ORDER, PERFORMED IN ~~LOC~~ HOSPITAL LAB): SARS Coronavirus 2: NEGATIVE

## 2018-12-07 LAB — ACETAMINOPHEN LEVEL: Acetaminophen (Tylenol), Serum: <10 ug/mL — ABNORMAL LOW (ref 10–30)

## 2018-12-07 LAB — SALICYLATE LEVEL: Salicylate Lvl: <7 mg/dL (ref 2.8–30.0)

## 2018-12-07 LAB — ETHANOL: Alcohol, Ethyl (B): <10 mg/dL (ref <10)

## 2018-12-07 LAB — TROPONIN I: Troponin I: 0.05 ng/mL (ref <0.03)

## 2018-12-07 LAB — LACTIC ACID, PLASMA: Lactic Acid, Venous: 1.5 mmol/L (ref 0.5–1.9)

## 2018-12-07 MED ORDER — SODIUM CHLORIDE 0.9 % IV BOLUS
1000.0000 mL | Freq: Once | INTRAVENOUS | Status: AC
  Administered 2018-12-07: 1000 mL via INTRAVENOUS

## 2018-12-07 MED ORDER — MORPHINE SULFATE (PF) 4 MG/ML IV SOLN
4.0000 mg | Freq: Once | INTRAVENOUS | Status: AC
  Administered 2018-12-07: 4 mg via INTRAVENOUS
  Filled 2018-12-07: qty 1

## 2018-12-07 MED ORDER — MORPHINE SULFATE (PF) 2 MG/ML IV SOLN
2.0000 mg | INTRAVENOUS | Status: DC | PRN
  Administered 2018-12-10 (×3): 2 mg via INTRAVENOUS
  Filled 2018-12-07 (×3): qty 1

## 2018-12-07 MED ORDER — SODIUM CHLORIDE 0.9 % IV SOLN
INTRAVENOUS | Status: DC
  Administered 2018-12-08 (×2): via INTRAVENOUS

## 2018-12-07 MED ORDER — OXYCODONE HCL 5 MG PO TABS
5.0000 mg | ORAL_TABLET | Freq: Four times a day (QID) | ORAL | Status: DC | PRN
  Administered 2018-12-08 – 2018-12-15 (×18): 5 mg via ORAL
  Filled 2018-12-07 (×18): qty 1

## 2018-12-07 MED ORDER — SODIUM CHLORIDE 0.9 % IV BOLUS: 1000.0000 mL | Freq: Once | INTRAVENOUS | Status: DC

## 2018-12-07 MED ORDER — HYDRALAZINE HCL 20 MG/ML IJ SOLN: 5.0000 mg | INTRAMUSCULAR | Status: DC | PRN

## 2018-12-07 MED ORDER — LORAZEPAM 2 MG/ML IJ SOLN
1.0000 mg | Freq: Once | INTRAMUSCULAR | Status: AC
  Administered 2018-12-07: 1 mg via INTRAVENOUS
  Filled 2018-12-07: qty 1

## 2018-12-07 MED ORDER — FENTANYL CITRATE (PF) 100 MCG/2ML IJ SOLN
50.0000 ug | Freq: Once | INTRAMUSCULAR | Status: AC
  Administered 2018-12-07: 50 ug via INTRAVENOUS
  Filled 2018-12-07: qty 2

## 2018-12-07 MED ORDER — AMLODIPINE BESYLATE 2.5 MG PO TABS
5.0000 mg | ORAL_TABLET | Freq: Every day | ORAL | Status: DC
  Administered 2018-12-08 – 2018-12-09 (×2): 5 mg via ORAL
  Filled 2018-12-07 (×2): qty 2

## 2018-12-07 NOTE — H&P (Signed)
History and Physical    Donald Jenkins RFF:[id number] DOB: 06-01-1971 DOA: 12/07/2018  Referring MD/NP/PA:   PCP: Patient, No Pcp Per   Patient coming from:  The patient is coming from home.  At baseline, pt is independent for most of ADL.        Chief Complaint: Back pain and muscle pain  HPI: Donald Jenkins is a 48 y.o. male with medical history significant hypertension, asthma, depression with anxiety, tachycardia, panic attack, hypersomnolence disorder, who presents with back pain and muscle pain.  Patient states that his back pain started this morning, which is located in the mid to upper back, constant, sharp, "20 out of 10 in severity" when moving, nonradiating.  No weakness or numbness in extremities. No saddle anesthesia, incontinence to bowel/bladder. He also has leg cramps and muscle pain.  He denies any injury.  Patient states that he is taking some type of supplement for exercise.  Patient denies any chest pain, cough, shortness breath at rest, fever or chills.  Denies symptoms of UTI or unilateral weakness. Per EMS report, patient was found lying naked on the floor of his living room w/ pills & emesis near by. Currently patient adamantly denies vomiting or consumption of any type of illicit drugs, EtOH, or pills. He does admit to marijuana use. Initially pt was alert and oriented x3.  However the patient is a transfer to the floor, he becomes confused and disorientated, but still oriented x 3.  ED Course: pt was found to have CK >50,000, alcohol less than 10, pending COVID-19 test, UDS positive for THC, troponin 0.05, lactic acid 1.5, WBC 23.5, urinalysis negative, Tylenol less than 10, salicylate level less than 7, abnormal liver function (ALP 68, AST 684, ALT 140, total bilirubin 1.2), AKI with creatinine 2.80, BUN 55), chest x-ray negative.  Patient is placed on telemetry bed for observation. Negative covid 19 test.  Review of Systems:   General: no fevers, chills, no  body weight gain, has fatigue HEENT: no blurry vision, hearing changes or sore throat Respiratory: no dyspnea, coughing, wheezing CV: no chest pain, no palpitations GI: currently no nausea, vomiting, abdominal pain, diarrhea, constipation GU: no dysuria, burning on urination, increased urinary frequency, hematuria  Ext: no leg edema Neuro: no unilateral weakness, numbness, or tingling, no vision change or hearing loss. Has confusion. Skin: no rash, no skin tear. MSK: has back pain, muscle cramps and muscle pain Heme: No easy bruising.  Travel history: No recent long distant travel.  Allergy:  Allergies  Allergen Reactions  . Penicillins Other (See Comments)    From childhood    Past Medical History:  Diagnosis Date  . Anxiety   . Hypertension   . Tachycardia     Past Surgical History:  Procedure Laterality Date  . CHOLECYSTECTOMY N/A 06/05/2018   Procedure: LAPAROSCOPIC CHOLECYSTECTOMY WITH POSSIBLE INTRAOPERATIVE CHOLANGIOGRAM;  Surgeon: Clovis Riley, MD;  Location: [facility];  Service: General;  Laterality: N/A;  . KNEE SURGERY      Social History:  reports that he has never smoked. He has never used smokeless tobacco. He reports current alcohol use. He reports current drug use. Drug: Marijuana.  Family History:  Family History  Problem Relation Age of Onset  . Lung cancer Father   . Esophageal cancer Father   . Brain cancer Father      Prior to Admission medications   Medication Sig Start Date End Date Taking? Authorizing Provider  ALPRAZolam Duanne Moron) 1 MG tablet Take  1 tablet (1 mg total) by mouth 3 (three) times daily as needed for anxiety. 11/01/18   Delight Hoh, MD  busPIRone (BUSPAR) 15 MG tablet Take 1 tablet (15 mg total) by mouth 2 (two) times daily. 11/15/18   Delight Hoh, MD  ibuprofen (ADVIL,MOTRIN) 800 MG tablet Take 1 tablet (800 mg total) by mouth every 8 (eight) hours as needed. 06/06/18   Jill Alexanders, PA-C   lisinopril-hydrochlorothiazide (ZESTORETIC) 10-12.5 MG tablet Take 1 tablet by mouth daily. 10/31/18   Maximiano Coss, NP  methylphenidate (RITALIN) 5 MG tablet Take 1 tablet (5 mg total) by mouth 3 (three) times daily with meals. 11/16/18   Delight Hoh, MD  montelukast (SINGULAIR) 10 MG tablet TAKE 1 TABLET BY MOUTH EVERYDAY AT BEDTIME 10/31/18   Maximiano Coss, NP  POTASSIUM PO Take 1 tablet by mouth daily. Over the counter    [provider]  zaleplon (SONATA) 10 MG capsule Take 1 capsule (10 mg total) by mouth at bedtime as needed for sleep. Reported on 01/14/2016 11/01/18   Delight Hoh, MD    Physical Exam: Vitals:   12/07/18 2300 12/08/18 0010 12/08/18 0018 12/08/18 0215  BP: (!) 151/91  (!) 162/87 (!) 156/93  Pulse: (!) 103  92 90  Resp: 11  20 20   Temp:   98.5 F (36.9 C) 98.7 F (37.1 C)  TempSrc:   Oral Oral  SpO2: 99%  100% 100%  Weight:  78.7 kg    Height:  6\' 1"  (1.854 m)     General: Not in acute distress HEENT:       Eyes: PERRL, EOMI, no scleral icterus.       ENT: No discharge from the ears and nose, no pharynx injection, no tonsillar enlargement.        Neck: No JVD, no bruit, no mass felt. Heme: No neck lymph node enlargement. Cardiac: S1/S2, RRR, No murmurs, No gallops or rubs. Respiratory: No rales, wheezing, rhonchi or rubs. GI: Soft, nondistended, nontender, no rebound pain, no organomegaly, BS present. GU: No hematuria Ext: No pitting leg edema bilaterally. 2+DP/PT pulse bilaterally. Musculoskeletal: has tenderness in midline of upper back Skin: No rashes.  Neuro: disoriented, but still oriented X3, cranial nerves II-XII grossly intact, moves all extremities normally. Psych: Patient is not psychotic, no suicidal or hemocidal ideation.  Labs on Admission: I have personally reviewed following labs and imaging studies  CBC: Recent Labs  Lab 12/07/18 1813  WBC 23.5*  NEUTROABS 20.8*  HGB 17.1*  HCT 48.7  MCV 89.0  PLT 174   Basic  Metabolic Panel: Recent Labs  Lab 12/07/18 1813  NA 138  K 4.3  CL 101  CO2 19*  GLUCOSE 113*  BUN 55*  CREATININE 2.80*  CALCIUM 9.4   GFR: Estimated Creatinine Clearance: 36.3 mL/min (A) (by C-G formula based on SCr of 2.8 mg/dL (H)). Liver Function Tests: Recent Labs  Lab 12/07/18 1813  AST 684*  ALT 140*  ALKPHOS 68  BILITOT 1.2  PROT 8.2*  ALBUMIN 4.7   No results for input(s): LIPASE, AMYLASE in the last 168 hours. No results for input(s): AMMONIA in the last 168 hours. Coagulation Profile: No results for input(s): INR, PROTIME in the last 168 hours. Cardiac Enzymes: Recent Labs  Lab 12/07/18 1813  CKTOTAL >50,000*  TROPONINI 0.05*   BNP (last 3 results) No results for input(s): PROBNP in the last 8760 hours. HbA1C: No results for input(s): HGBA1C in the last 72  hours. CBG: No results for input(s): GLUCAP in the last 168 hours. Lipid Profile: No results for input(s): CHOL, HDL, LDLCALC, TRIG, CHOLHDL, LDLDIRECT in the last 72 hours. Thyroid Function Tests: No results for input(s): TSH, T4TOTAL, FREET4, T3FREE, THYROIDAB in the last 72 hours. Anemia Panel: No results for input(s): VITAMINB12, FOLATE, FERRITIN, TIBC, IRON, RETICCTPCT in the last 72 hours. Urine analysis:    Component Value Date/Time   COLORURINE YELLOW 12/07/2018 1930   APPEARANCEUR CLOUDY (A) 12/07/2018 1930   LABSPEC 1.016 12/07/2018 1930   PHURINE 5.0 12/07/2018 1930   GLUCOSEU NEGATIVE 12/07/2018 1930   HGBUR LARGE (A) 12/07/2018 1930   BILIRUBINUR NEGATIVE 12/07/2018 1930   BILIRUBINUR small (A) 06/06/2015 1020   BILIRUBINUR neg 07/23/2014 1058   KETONESUR 5 (A) 12/07/2018 1930   PROTEINUR 100 (A) 12/07/2018 1930   UROBILINOGEN 0.2 06/06/2015 1020   UROBILINOGEN 0.2 02/12/2014 2204   NITRITE NEGATIVE 12/07/2018 1930   LEUKOCYTESUR NEGATIVE 12/07/2018 1930   Sepsis Labs: @LABRCNTIP (procalcitonin:4,lacticidven:4) ) Recent Results (from the past 240 hour(s))  SARS  Coronavirus 2 (CEPHEID - Performed in [facility] hospital lab), Hosp Order     Status: None   Collection Time: 12/07/18 10:36 PM   Specimen: Nasopharyngeal Swab  Result Value Ref Range Status   SARS Coronavirus 2 NEGATIVE NEGATIVE Final    Comment: (NOTE) If result is NEGATIVE SARS-CoV-2 target nucleic acids are NOT DETECTED. The SARS-CoV-2 RNA is generally detectable in upper and lower  respiratory specimens during the acute phase of infection. The lowest  concentration of SARS-CoV-2 viral copies this assay can detect is 250  copies / mL. A negative result does not preclude SARS-CoV-2 infection  and should not be used as the sole basis for treatment or other  patient management decisions.  A negative result may occur with  improper specimen collection / handling, submission of specimen other  than nasopharyngeal swab, presence of viral mutation(s) within the  areas targeted by this assay, and inadequate number of viral copies  (<250 copies / mL). A negative result must be combined with clinical  observations, patient history, and epidemiological information. If result is POSITIVE SARS-CoV-2 target nucleic acids are DETECTED. The SARS-CoV-2 RNA is generally detectable in upper and lower  respiratory specimens dur ing the acute phase of infection.  Positive  results are indicative of active infection with SARS-CoV-2.  Clinical  correlation with patient history and other diagnostic information is  necessary to determine patient infection status.  Positive results do  not rule out bacterial infection or co-infection with other viruses. If result is PRESUMPTIVE POSTIVE SARS-CoV-2 nucleic acids MAY BE PRESENT.   A presumptive positive result was obtained on the submitted specimen  and confirmed on repeat testing.  While 2019 novel coronavirus  (SARS-CoV-2) nucleic acids may be present in the submitted sample  additional confirmatory testing may be necessary for epidemiological  and / or  clinical management purposes  to differentiate between  SARS-CoV-2 and other Sarbecovirus currently known to infect humans.  If clinically indicated additional testing with an alternate test  methodology [phone]) is advised. The SARS-CoV-2 RNA is generally  detectable in upper and lower respiratory sp ecimens during the acute  phase of infection. The expected result is Negative. Fact Sheet for Patients:  StrictlyIdeas.no Fact Sheet for Healthcare Providers: BankingDealers.co.za This test is not yet approved or cleared by the [place] FDA and has been authorized for detection and/or diagnosis of SARS-CoV-2 by FDA under an Emergency Use Authorization (EUA).  This EUA will remain in effect (meaning this test can be used) for the duration of the COVID-19 declaration under Section 564(b)(1) of the Act, 21 U.S.C. section 360bbb-3(b)(1), unless the authorization is terminated or revoked sooner. Performed at [facility], [place] [address], [place], [place] [address]      Radiological Exams on Admission: Dg Thoracic Spine 2 View  Result Date: 12/08/2018 CLINICAL DATA:  48 year old male status post fall.  Pain. EXAM: THORACIC SPINE 2 VIEWS COMPARISON:  Cervical spine CT today reported separately. Chest radiographs 07/02/2013. FINDINGS: Normal thoracic segmentation. There is a moderate compression fracture of T8 which is new since 2015. At other levels thoracic vertebral height and alignment appears preserved. Cervicothoracic junction alignment is within normal limits. Preserved disc spaces. Posterior ribs appear intact. Negative visible chest and abdominal visceral contours. IMPRESSION: Moderate T8 compression fracture, new since 2015 and likely acute in this clinical setting. If specific therapy is desired, Thoracic MRI without contrast or Nuclear Medicine Whole-body Bone Scan would confirm candidacy for vertebroplasty. Electronically Signed    By: Genevie Ann M.D.   On: 12/08/2018 02:10   Ct Head Wo Contrast  Result Date: 12/08/2018 CLINICAL DATA:  48 year old male status post fall. Altered mental status. EXAM: CT HEAD WITHOUT CONTRAST CT CERVICAL SPINE WITHOUT CONTRAST TECHNIQUE: Multidetector CT imaging of the head and cervical spine was performed following the standard protocol without intravenous contrast. Multiplanar CT image reconstructions of the cervical spine were also generated. COMPARISON:  None. FINDINGS: CT HEAD FINDINGS Brain: No midline shift, ventriculomegaly, mass effect, evidence of mass lesion, intracranial hemorrhage or evidence of cortically based acute infarction. Gray-white matter differentiation is within normal limits throughout the brain. Vascular: Mild Calcified atherosclerosis at the skull base. No suspicious intracranial vascular hyperdensity. Skull: Negative. Sinuses/Orbits: Visualized paranasal sinuses and mastoids are clear. Other: Visualized orbits and scalp soft tissues are within normal limits. CT CERVICAL SPINE FINDINGS Alignment: Relatively preserved cervical lordosis. Levoconvex cervical spine curvature or scoliosis. Cervicothoracic junction alignment is within normal limits. Bilateral posterior element alignment is within normal limits. Skull base and vertebrae: Visualized skull base is intact. No atlanto-occipital dissociation. Motion artifact at C4 and C5, with repeat imaging through those levels. No cervical spine fracture identified. Benign appearing sclerosis of the left C4 facet. Normal bone mineralization otherwise. Soft tissues and spinal canal: No prevertebral fluid or swelling. No visible canal hematoma. Calcified carotid atherosclerosis greater on the left. Disc levels: Disc space loss with endplate spurring at A2-Z3 and C6-C7. Upper chest: Negative visible upper thoracic levels and lung apices. IMPRESSION: 1. Normal noncontrast CT appearance of the brain. 2. No acute traumatic injury identified in the  head or cervical spine. Electronically Signed   By: Genevie Ann M.D.   On: 12/08/2018 02:07   Ct Cervical Spine Wo Contrast  Result Date: 12/08/2018 CLINICAL DATA:  48 year old male status post fall. Altered mental status. EXAM: CT HEAD WITHOUT CONTRAST CT CERVICAL SPINE WITHOUT CONTRAST TECHNIQUE: Multidetector CT imaging of the head and cervical spine was performed following the standard protocol without intravenous contrast. Multiplanar CT image reconstructions of the cervical spine were also generated. COMPARISON:  None. FINDINGS: CT HEAD FINDINGS Brain: No midline shift, ventriculomegaly, mass effect, evidence of mass lesion, intracranial hemorrhage or evidence of cortically based acute infarction. Gray-white matter differentiation is within normal limits throughout the brain. Vascular: Mild Calcified atherosclerosis at the skull base. No suspicious intracranial vascular hyperdensity. Skull: Negative. Sinuses/Orbits: Visualized paranasal sinuses and mastoids are clear. Other: Visualized orbits and  scalp soft tissues are within normal limits. CT CERVICAL SPINE FINDINGS Alignment: Relatively preserved cervical lordosis. Levoconvex cervical spine curvature or scoliosis. Cervicothoracic junction alignment is within normal limits. Bilateral posterior element alignment is within normal limits. Skull base and vertebrae: Visualized skull base is intact. No atlanto-occipital dissociation. Motion artifact at C4 and C5, with repeat imaging through those levels. No cervical spine fracture identified. Benign appearing sclerosis of the left C4 facet. Normal bone mineralization otherwise. Soft tissues and spinal canal: No prevertebral fluid or swelling. No visible canal hematoma. Calcified carotid atherosclerosis greater on the left. Disc levels: Disc space loss with endplate spurring at B6-L8 and C6-C7. Upper chest: Negative visible upper thoracic levels and lung apices. IMPRESSION: 1. Normal noncontrast CT appearance of the  brain. 2. No acute traumatic injury identified in the head or cervical spine. Electronically Signed   By: Genevie Ann M.D.   On: 12/08/2018 02:07   Dg Chest Portable 1 View  Result Date: 12/07/2018 CLINICAL DATA:  Back pain EXAM: PORTABLE CHEST 1 VIEW COMPARISON:  Chest x-ray dated 02/12/2014. FINDINGS: The heart size and mediastinal contours are within normal limits. Both lungs are clear. The visualized skeletal structures are unremarkable. There are probable old healed anterior rib fractures on the right. IMPRESSION: No active disease. Electronically Signed   By: Constance Holster M.D.   On: 12/07/2018 22:05     EKG: Independently reviewed.  Sinus rhythm, tachycardia, QTC 458, early R wave progression, nonspecific T wave change.  Assessment/Plan Principal Problem:   Rhabdomyolysis Active Problems:   Essential hypertension, benign   Hypersomnolence disorder, with mental disorder, acute, moderate   SIRS (systemic inflammatory response syndrome) (HCC)   Back pain   Depression with anxiety   Abnormal LFTs   AKI (acute kidney injury) ([place])   Elevated troponin   Acute metabolic encephalopathy   Metabolic acidosis   Rhabdomyolysis: Patient's presentation is most likely caused by rhabdomyolysis. His CK>50000. Also   - will admit to med-Surg bed  - IVF: received 3 L of NS in ED, will continue at 125 cc/h.  - repeat CK in AM - check Mag level  - avoid NSAIDs and tylenol given elevated AST and ALT  Acute metabolic encephalopathy: Etiology is not clear.  Patient could have a fall at home since patient was found on the floor. -stat CT-head and C spin-->results showed no acute intracranial abnormalities or C-spine fracture -Frequent neuro check  Metabolic acidosis: Bicarbonate 19, anion gap 18. -Check volatile and ethylene glycol level -IVF as above  Essential hypertension, benign: -hold Zestoretic due to AKI -start amlodipine 5 mg daily -IV hydralazine prn  Hypersomnolence disorder,  with mental disorder, acute, moderate: -continue ritalin  SIRS vs. Sepsis: pt has WBC 23 and tachycardia, meeting criteria for SIRS vs. Sepsis. Lactic acid normal 1.5. No source of infection is identified. UA negative and CXR negative. -will start IV vancomycine and cefepime empirically now -f/y Bx and Ux -IVF as above -check procalcitonin and trend lactic   Back pain: has upper back pain. Unclear etiology -f/u X-ray of T-spin-->X-ray showed moderate T8 compression fracture, new since 2015 and likely acute in this clinical setting. -prn oxycodone (can not NSIADs due to Aki, cannot use Tylenol due to abnormal LFT) -pt/OT  Depression with anxiety: -continue home  Buspar and Xanax  Abnormal LFTs: Likely due to rhabdomyolysis.  -will check hepatitis panel and HIV ab -avoid using liver toxic meds, such as Tylenol  AKI (acute kidney injury) ([place]): likely due to rhabdo. -  IVF as above -hold Zestoretic  Elevated troponin: Troponin 0.05.  No CP.  Likely due to demand ischemia. AKI may have contributed partially for elevated troponin. - cycle CE q6 x3 and repeat EKG in the am  - prn Morphine, and aspirin - Risk factor stratification: will check FLP and A1C     DVT ppx: SQ Heparin    Code Status: Full code Family Communication: None at bed side.    Disposition Plan:  Anticipate discharge back to previous home environment Consults called:  none Admission status: Obs / tele   Date of Service 12/08/2018    [facility]   If 7PM-7AM, please contact night-coverage [email or web address] Password [facility] 12/08/2018, 3:17 AM

## 2018-12-07 NOTE — ED Provider Notes (Signed)
[facility] Provider Note   CSN: [id number] Arrival date & time: 12/07/18  1730    History   Chief Complaint Chief Complaint  Patient presents with  . Back Pain    HPI RAMI BUDHU is a 48 y.o. male with a hx of anxiety, depression, & HTN who presents to the ED via EMS w/ complaints of mid upper back pain that began this AM upon waking up. Patient states he went to bed feeling well after a normal day and woke up with intense pain to the upper mid back. States pain has been constant, severe, worse with any movement, no alleviating factors. He states that if he moves or takes a deep breath he starts to have cramping in his arms/legs w/ the increased pain in the back. He tried ibuprofen w/o relief. Pain severe to the point he feels it is causing nausea , but denies vomiting. Pain does not radiate into the chest. Pain in the back specifically does not worsen w/ a deep breath, just cramping in his arms and legs does. He woke up in bed w/ the pain and slowly crawled to the living room where he has been lying all day. Denies injury or trauma. Denies new activity.Denies numbness, tingling, weakness, saddle anesthesia, incontinence to bowel/bladder, fever, chills, IV drug use, dysuria, or hx of cancer. Patient has not had prior back surgeries. Denies swelling, hemoptysis, recent surgery/trauma, recent long travel, hormone use, personal hx of cancer, or hx of DVT/PE. He admits to smoking marijuana, none today.   Per EMS report to triage team, I was not able to speak directly w/ EMS staff, patient was found lying naked on the floor of his living room w/ pills & emesis near by. Patient adamantly denies vomiting or consumption of any type of illicit drugs, EtOH, or pills of any kind (OTC/prescribed/illicit) other than that he does admit to marijuana use, not today though. Alert & oriented x 4. Very agitated & irritable w/ staff on arrival.      HPI  Past  Medical History:  Diagnosis Date  . Anxiety   . Hypertension   . Tachycardia     Patient Active Problem List   Diagnosis Date Noted  . Hypersomnolence disorder, with mental disorder, acute, moderate 11/01/2018  . Cholecystitis 06/05/2018  . Recurrent major depression in full remission ([place]) 04/05/2018  . Social anxiety disorder 03/18/2018  . Panic disorder 03/18/2018  . Essential hypertension, benign 06/06/2015    Past Surgical History:  Procedure Laterality Date  . CHOLECYSTECTOMY N/A 06/05/2018   Procedure: LAPAROSCOPIC CHOLECYSTECTOMY WITH POSSIBLE INTRAOPERATIVE CHOLANGIOGRAM;  Surgeon: Clovis Riley, MD;  Location: [facility];  Service: General;  Laterality: N/A;  . KNEE SURGERY          Home Medications    Prior to Admission medications   Medication Sig Start Date End Date Taking? Authorizing Provider  ALPRAZolam Duanne Moron) 1 MG tablet Take 1 tablet (1 mg total) by mouth 3 (three) times daily as needed for anxiety. 11/01/18   Delight Hoh, MD  busPIRone (BUSPAR) 15 MG tablet Take 1 tablet (15 mg total) by mouth 2 (two) times daily. 11/15/18   Delight Hoh, MD  ibuprofen (ADVIL,MOTRIN) 800 MG tablet Take 1 tablet (800 mg total) by mouth every 8 (eight) hours as needed. 06/06/18   Jill Alexanders, PA-C  lisinopril-hydrochlorothiazide (ZESTORETIC) 10-12.5 MG tablet Take 1 tablet by mouth daily. 10/31/18   Maximiano Coss, NP  methylphenidate (RITALIN)  5 MG tablet Take 1 tablet (5 mg total) by mouth 3 (three) times daily with meals. 11/16/18   Delight Hoh, MD  montelukast (SINGULAIR) 10 MG tablet TAKE 1 TABLET BY MOUTH EVERYDAY AT BEDTIME 10/31/18   Maximiano Coss, NP  POTASSIUM PO Take 1 tablet by mouth daily. Over the counter    [provider]  zaleplon (SONATA) 10 MG capsule Take 1 capsule (10 mg total) by mouth at bedtime as needed for sleep. Reported on 01/14/2016 11/01/18   Delight Hoh, MD    Family History Family History  Problem Relation Age of  Onset  . Lung cancer Father   . Esophageal cancer Father   . Brain cancer Father     Social History Social History   Tobacco Use  . Smoking status: Never Smoker  . Smokeless tobacco: Never Used  Substance Use Topics  . Alcohol use: Yes    Alcohol/week: 0.0 standard drinks    Comment: socially, "once in a blue moon"  . Drug use: Yes    Types: Marijuana     Allergies   Penicillins   Review of Systems Review of Systems  Constitutional: Negative for chills, fever and unexpected weight change.  Respiratory: Negative for stridor.   Cardiovascular: Negative for chest pain, palpitations and leg swelling.  Gastrointestinal: Positive for nausea. Negative for abdominal pain and vomiting.  Genitourinary: Negative for dysuria.  Musculoskeletal: Positive for back pain and myalgias.  Neurological: Negative for weakness and numbness.       Negative for saddle anesthesia or bowel/bladder incontinence.   All other systems reviewed and are negative.  Physical Exam Updated Vital Signs BP (!) 151/91   Pulse (!) 103   Temp 98.9 F (37.2 C) (Rectal)   Resp 11   Ht 6\' 1"  (1.854 m)   Wt 77.1 kg   SpO2 99%   BMI 22.43 kg/m   Physical Exam Vitals signs and nursing note reviewed.  Constitutional:      Appearance: He is well-developed. He is not toxic-appearing or diaphoretic.  HENT:     Head: Normocephalic and atraumatic. No raccoon eyes or Battle's sign.  Eyes:     General:        Right eye: No discharge.        Left eye: No discharge.     Extraocular Movements: Extraocular movements intact.     Conjunctiva/sclera: Conjunctivae normal.     Pupils: Pupils are equal, round, and reactive to light.  Neck:     Musculoskeletal: Normal range of motion and neck supple. No neck rigidity, spinous process tenderness or muscular tenderness.  Cardiovascular:     Rate and Rhythm: Regular rhythm. Tachycardia present.     Pulses:          Radial pulses are 2+ on the right side and 2+ on the  left side.       Dorsalis pedis pulses are 2+ on the right side and 2+ on the left side.  Pulmonary:     Effort: Pulmonary effort is normal. No respiratory distress.     Breath sounds: Normal breath sounds. No wheezing, rhonchi or rales.  Chest:     Chest wall: No tenderness.  Abdominal:     General: There is no distension.     Palpations: Abdomen is soft.     Tenderness: There is no abdominal tenderness.  Musculoskeletal:     Comments: No obvious deformity, erythema, warmth, or open wounds.   Upper/lower extremities: Patient has  intact active range of motion throughout bilateral upper and lower extremities.  No tenderness to palpation.  Compartments are soft. Back: Patient has no midline tenderness to palpation of the cervical, thoracic, lumbar, sacral, or coccygeal spine.  No significant paraspinal muscle tenderness palpation.  No palpable step-off.  Skin:    General: Skin is warm and dry.     Findings: No rash.  Neurological:     Mental Status: He is alert.     Comments: Clear speech.  Oriented x4.  Sensation grossly intact bilateral upper and lower extremities.  5 out of 5 symmetric grip strength.  5 out of 5 strength with plantar and dorsiflexion bilaterally.  Patellar and tricep DTRs are 2+ and symmetric.  Patient refused to attempt ambulation on initial assessment secondary to his discomfort.  Psychiatric:     Comments: Patient is very agitated.    ED Treatments / Results  Labs (all labs ordered are listed, but only abnormal results are displayed) Labs Reviewed  CBC WITH DIFFERENTIAL/PLATELET - Abnormal; Notable for the following components:      Result Value   WBC 23.5 (*)    Hemoglobin 17.1 (*)    Neutro Abs 20.8 (*)    Monocytes Absolute 1.7 (*)    Abs Immature Granulocytes 0.13 (*)    All other components within normal limits  COMPREHENSIVE METABOLIC PANEL - Abnormal; Notable for the following components:   CO2 19 (*)    Glucose, Bld 113 (*)    BUN 55 (*)     Creatinine, Ser 2.80 (*)    Total Protein 8.2 (*)    AST 684 (*)    ALT 140 (*)    GFR calc non Af Amer 26 (*)    GFR calc Af Amer 30 (*)    Anion gap 18 (*)    All other components within normal limits  RAPID URINE DRUG SCREEN, HOSP PERFORMED - Abnormal; Notable for the following components:   Tetrahydrocannabinol POSITIVE (*)    All other components within normal limits  ACETAMINOPHEN LEVEL - Abnormal; Notable for the following components:   Acetaminophen (Tylenol), Serum <10 (*)    All other components within normal limits  URINALYSIS, ROUTINE W REFLEX MICROSCOPIC - Abnormal; Notable for the following components:   APPearance CLOUDY (*)    Hgb urine dipstick LARGE (*)    Ketones, ur 5 (*)    Protein, ur 100 (*)    Bacteria, UA RARE (*)    All other components within normal limits  CK - Abnormal; Notable for the following components:   Total CK >50,000 (*)    All other components within normal limits  TROPONIN I - Abnormal; Notable for the following components:   Troponin I 0.05 (*)    All other components within normal limits  SARS CORONAVIRUS 2 (HOSPITAL ORDER, PERFORMED IN [facility])  CULTURE, BLOOD (ROUTINE X 2)  CULTURE, BLOOD (ROUTINE X 2)  ETHANOL  SALICYLATE LEVEL  LACTIC ACID, PLASMA  LACTIC ACID, PLASMA  CK  HEPATITIS PANEL, ACUTE  HEMOGLOBIN A1C  LIPID PANEL  TROPONIN I  TROPONIN I  TROPONIN I  PROCALCITONIN    EKG EKG Interpretation  Date/Time:  Wednesday December 07 2018 19:25:33 EDT Ventricular Rate:  109 PR Interval:    QRS Duration: 87 QT Interval:  340 QTC Calculation: 458 R Axis:   67 Text Interpretation:  Sinus tachycardia RSR' in V1 or V2, probably normal variant No STEMI  Confirmed by Nanda Quinton [phone])  on 12/07/2018 7:33:52 PM   Radiology Dg Chest Portable 1 View  Result Date: 12/07/2018 CLINICAL DATA:  Back pain EXAM: PORTABLE CHEST 1 VIEW COMPARISON:  Chest x-ray dated 02/12/2014. FINDINGS: The heart size and  mediastinal contours are within normal limits. Both lungs are clear. The visualized skeletal structures are unremarkable. There are probable old healed anterior rib fractures on the right. IMPRESSION: No active disease. Electronically Signed   By: Constance Holster M.D.   On: 12/07/2018 22:05    Procedures Procedures (including critical care time)  CRITICAL CARE Performed by: Kennith Maes   Total critical care time: 30 minutes  Critical care time was exclusive of separately billable procedures and treating other patients.  Critical care was necessary to treat or prevent imminent or life-threatening deterioration.  Critical care was time spent personally by me on the following activities: development of treatment plan with patient and/or surrogate as well as nursing, discussions with consultants, evaluation of patient's response to treatment, examination of patient, obtaining history from patient or surrogate, ordering and performing treatments and interventions, ordering and review of laboratory studies, ordering and review of radiographic studies, pulse oximetry and re-evaluation of patient's condition.   Medications Ordered in ED Medications - No data to display   Initial Impression / Assessment and Plan / ED Course  I have reviewed the triage vital signs and the nursing notes.  Pertinent labs & imaging results that were available during my care of the patient were reviewed by me and considered in my medical decision making (see chart for details).   Patient presents to the emergency department via EMS with complaints of mid upper back pain.  Per EMS to triage patient was in his living room in the nude with vomit and pills nearby.  Patient alert and oriented x4 and adamantly denying emesis or any type of consumption of pills (prescription, OTC, illicit) or any form of overdose.  He is tachycardic & hypertensive, afebrile orally& rectally. Patient is somewhat agitated/irritable.   No meningismus. He is moving all of his extremities without focal neurologic deficit and is NVI distally x 4  Compartments are soft without signs of compartment syndrome. I am unable to reproduce patient's back pain on exam, he does state that it feels better with palpation of the left thoracic upper paraspinal muscles and is requesting that we massage this area.  He has been laying on the ground all day secondary to pain. Broad DDX including muscle strain/spasm, rhabdomyolysis, dissection, PE, ACS, metabolic derangement, electrolyte derangement, infectious process, overdose/intoxication.  Discussed with supervising physician Dr. Laverta Baltimore, will initiate broad work-up with labs, initial plan for CTA dissection study.   CBC: Leukocytosis @ 23.5 w/ left shift. No anemia.  CMP: Significant AKI - creatinine elevated @ 2.80 from 0.95 on record, BUN elevated as well. LFTs are up AST 684 ALT 140. Anion gap elevation w/ mild acidosis. Electrolytes w/o significant abnormalities.  Lactic acid: WNL UA: Hemoglobinuria, not consistent w/ UTI.  Ethanol WNL UDS w/ tetrahydrocannabinol, otherwise negative Acetaminophen/salicylate: WNL CK: markedly elevated > 50,000 Trop mild elevation @ 0.05 EKG no STEMI- low suspicion for ACS.  CXR: No active disease.   Given patient's AKI no longer able to obtain CTA dissection study however @ this point feel this is unnecessary as etiology of his sxs appears to be severe rhabdomyolysis w/ CK > 50,000, AKI, hemoglobinuria. On re-eval of patient's urine in urinal at bedside it is coca-cola like. His AST/ALT elevation are likely secondary to rhabdo, no abdominal  tenderness, tylenol WNL, patient denies frequent EtOH use. Remains without signs of compartment syndrome on repeat exams. Fluids have been running in the ER w/ additional boluses ordered. Unclear definitive cause. He does utilize supplements including serrapeptase & nattokinase, however has been using these for 7 years. No new meds,  not on statins. Plan to consult for admission.   22:34: CONSULT: Discussed w/ hospitalist Dr. Blaine Hamper who accepts admission.   Findings and plan of care discussed with supervising physician Dr. Laverta Baltimore who has evaluated patient & is in agreement.   covid swab ordered secondary to admission.  COLSEN MODI was evaluated in Emergency Department on 12/07/2018 for the symptoms described in the history of present illness. He/she was evaluated in the context of the global COVID-19 pandemic, which necessitated consideration that the patient might be at risk for infection with the SARS-CoV-2 virus that causes COVID-19. Institutional protocols and algorithms that pertain to the evaluation of patients at risk for COVID-19 are in a state of rapid change based on information released by regulatory bodies including the CDC and federal and state organizations. These policies and algorithms were followed during the patient's care in the ED.   Final Clinical Impressions(s) / ED Diagnoses   Final diagnoses:  Non-traumatic rhabdomyolysis  AKI (acute kidney injury) [facility])  Elevated LFTs    ED Discharge Orders    None       Amaryllis Dyke, PA-C 12/07/18 2358    Margette Fast, MD 12/08/18 1209

## 2018-12-07 NOTE — ED Notes (Signed)
PLEASE CALL MOM WITH UPDATE Donald Jenkins  [phone]

## 2018-12-07 NOTE — ED Notes (Signed)
Report called to rn on 3e 

## 2018-12-07 NOTE — ED Triage Notes (Signed)
Per EMS pt called reporting back pain that begins in the lower back and radiates to his middle back. Upon arrival EMS reports pt was naked on floor, near pills, and vomit. Pt denies taking any pills or vomiting. Pt also denies ETOH drug use. Pt lives alone. Pt denies CP and SHOB. Pt reports pain in his back  When he tales a deep breath, he also reports cramping in his legs and pain when he tries to move them.

## 2018-12-07 NOTE — ED Notes (Signed)
C.o back and leg pain still upset because his mother that he called came to the ed and was not allowed to come in

## 2018-12-08 ENCOUNTER — Observation Stay (HOSPITAL_COMMUNITY): Payer: BC Managed Care – PPO

## 2018-12-08 DIAGNOSIS — S22000A Wedge compression fracture of unspecified thoracic vertebra, initial encounter for closed fracture: Secondary | ICD-10-CM | POA: Diagnosis not present

## 2018-12-08 DIAGNOSIS — R5381 Other malaise: Secondary | ICD-10-CM | POA: Diagnosis present

## 2018-12-08 DIAGNOSIS — Z1159 Encounter for screening for other viral diseases: Secondary | ICD-10-CM | POA: Diagnosis not present

## 2018-12-08 DIAGNOSIS — M549 Dorsalgia, unspecified: Secondary | ICD-10-CM | POA: Diagnosis present

## 2018-12-08 DIAGNOSIS — E162 Hypoglycemia, unspecified: Secondary | ICD-10-CM | POA: Diagnosis not present

## 2018-12-08 DIAGNOSIS — E872 Acidosis, unspecified: Secondary | ICD-10-CM | POA: Diagnosis present

## 2018-12-08 DIAGNOSIS — M4854XA Collapsed vertebra, not elsewhere classified, thoracic region, initial encounter for fracture: Secondary | ICD-10-CM | POA: Diagnosis present

## 2018-12-08 DIAGNOSIS — B955 Unspecified streptococcus as the cause of diseases classified elsewhere: Secondary | ICD-10-CM | POA: Diagnosis present

## 2018-12-08 DIAGNOSIS — Z880 Allergy status to penicillin: Secondary | ICD-10-CM | POA: Diagnosis not present

## 2018-12-08 DIAGNOSIS — Z9049 Acquired absence of other specified parts of digestive tract: Secondary | ICD-10-CM | POA: Diagnosis not present

## 2018-12-08 DIAGNOSIS — R7881 Bacteremia: Secondary | ICD-10-CM | POA: Diagnosis not present

## 2018-12-08 DIAGNOSIS — Y92009 Unspecified place in unspecified non-institutional (private) residence as the place of occurrence of the external cause: Secondary | ICD-10-CM | POA: Diagnosis not present

## 2018-12-08 DIAGNOSIS — J45909 Unspecified asthma, uncomplicated: Secondary | ICD-10-CM | POA: Diagnosis present

## 2018-12-08 DIAGNOSIS — F418 Other specified anxiety disorders: Secondary | ICD-10-CM | POA: Diagnosis present

## 2018-12-08 DIAGNOSIS — M546 Pain in thoracic spine: Secondary | ICD-10-CM | POA: Diagnosis not present

## 2018-12-08 DIAGNOSIS — R945 Abnormal results of liver function studies: Secondary | ICD-10-CM | POA: Diagnosis not present

## 2018-12-08 DIAGNOSIS — F329 Major depressive disorder, single episode, unspecified: Secondary | ICD-10-CM | POA: Diagnosis present

## 2018-12-08 DIAGNOSIS — M47814 Spondylosis without myelopathy or radiculopathy, thoracic region: Secondary | ICD-10-CM | POA: Diagnosis present

## 2018-12-08 DIAGNOSIS — T796XXA Traumatic ischemia of muscle, initial encounter: Secondary | ICD-10-CM | POA: Diagnosis not present

## 2018-12-08 DIAGNOSIS — I10 Essential (primary) hypertension: Secondary | ICD-10-CM | POA: Diagnosis present

## 2018-12-08 DIAGNOSIS — M4804 Spinal stenosis, thoracic region: Secondary | ICD-10-CM | POA: Diagnosis present

## 2018-12-08 DIAGNOSIS — I248 Other forms of acute ischemic heart disease: Secondary | ICD-10-CM | POA: Diagnosis present

## 2018-12-08 DIAGNOSIS — G4710 Hypersomnia, unspecified: Secondary | ICD-10-CM | POA: Diagnosis present

## 2018-12-08 DIAGNOSIS — F1290 Cannabis use, unspecified, uncomplicated: Secondary | ICD-10-CM | POA: Diagnosis present

## 2018-12-08 DIAGNOSIS — M6282 Rhabdomyolysis: Secondary | ICD-10-CM | POA: Diagnosis present

## 2018-12-08 DIAGNOSIS — K5900 Constipation, unspecified: Secondary | ICD-10-CM | POA: Diagnosis not present

## 2018-12-08 DIAGNOSIS — G9341 Metabolic encephalopathy: Secondary | ICD-10-CM | POA: Diagnosis present

## 2018-12-08 DIAGNOSIS — W08XXXA Fall from other furniture, initial encounter: Secondary | ICD-10-CM | POA: Diagnosis present

## 2018-12-08 DIAGNOSIS — N179 Acute kidney failure, unspecified: Secondary | ICD-10-CM | POA: Diagnosis present

## 2018-12-08 DIAGNOSIS — E876 Hypokalemia: Secondary | ICD-10-CM | POA: Diagnosis not present

## 2018-12-08 DIAGNOSIS — F410 Panic disorder [episodic paroxysmal anxiety] without agoraphobia: Secondary | ICD-10-CM | POA: Diagnosis present

## 2018-12-08 LAB — LIPID PANEL
Cholesterol: 142 mg/dL (ref 0–200)
HDL: 56 mg/dL (ref >40)
LDL Cholesterol: 64 mg/dL (ref 0–99)
Total CHOL/HDL Ratio: 2.5 RATIO
Triglycerides: 112 mg/dL (ref <150)
VLDL: 22 mg/dL (ref 0–40)

## 2018-12-08 LAB — COMPREHENSIVE METABOLIC PANEL
ALT: 161 U/L — ABNORMAL HIGH (ref 0–44)
AST: 775 U/L — ABNORMAL HIGH (ref 15–41)
Albumin: 3.6 g/dL (ref 3.5–5.0)
Alkaline Phosphatase: 54 U/L (ref 38–126)
Anion gap: 14 (ref 5–15)
BUN: 47 mg/dL — ABNORMAL HIGH (ref 6–20)
CO2: 14 mmol/L — ABNORMAL LOW (ref 22–32)
Calcium: 8.1 mg/dL — ABNORMAL LOW (ref 8.9–10.3)
Chloride: 108 mmol/L (ref 98–111)
Creatinine, Ser: 2.04 mg/dL — ABNORMAL HIGH (ref 0.61–1.24)
GFR calc Af Amer: 44 mL/min — ABNORMAL LOW (ref >60)
GFR calc non Af Amer: 38 mL/min — ABNORMAL LOW (ref >60)
Glucose, Bld: 90 mg/dL (ref 70–99)
Potassium: 4.1 mmol/L (ref 3.5–5.1)
Sodium: 136 mmol/L (ref 135–145)
Total Bilirubin: 1 mg/dL (ref 0.3–1.2)
Total Protein: 6.5 g/dL (ref 6.5–8.1)

## 2018-12-08 LAB — GLUCOSE, CAPILLARY
Glucose-Capillary: 62 mg/dL — ABNORMAL LOW (ref 70–99)
Glucose-Capillary: 67 mg/dL — ABNORMAL LOW (ref 70–99)
Glucose-Capillary: 74 mg/dL (ref 70–99)
Glucose-Capillary: 131 mg/dL — ABNORMAL HIGH (ref 70–99)
Glucose-Capillary: 42 mg/dL — CL (ref 70–99)
Glucose-Capillary: 65 mg/dL — ABNORMAL LOW (ref 70–99)
Glucose-Capillary: 81 mg/dL (ref 70–99)
Glucose-Capillary: 59 mg/dL — ABNORMAL LOW (ref 70–99)
Glucose-Capillary: 78 mg/dL (ref 70–99)
Glucose-Capillary: 75 mg/dL (ref 70–99)
Glucose-Capillary: 76 mg/dL (ref 70–99)
Glucose-Capillary: 81 mg/dL (ref 70–99)

## 2018-12-08 LAB — HEMOGLOBIN A1C
Hgb A1c MFr Bld: 4.8 % (ref 4.8–5.6)
Mean Plasma Glucose: 91.06 mg/dL

## 2018-12-08 LAB — LACTIC ACID, PLASMA: Lactic Acid, Venous: 1.3 mmol/L (ref 0.5–1.9)

## 2018-12-08 LAB — CBC
HCT: 38.1 % — ABNORMAL LOW (ref 39.0–52.0)
Hemoglobin: 13.1 g/dL (ref 13.0–17.0)
MCH: 31.5 pg (ref 26.0–34.0)
MCHC: 34.4 g/dL (ref 30.0–36.0)
MCV: 91.6 fL (ref 80.0–100.0)
Platelets: 214 10*3/uL (ref 150–400)
RBC: 4.16 MIL/uL — ABNORMAL LOW (ref 4.22–5.81)
RDW: 12.5 % (ref 11.5–15.5)
WBC: 16 10*3/uL — ABNORMAL HIGH (ref 4.0–10.5)
nRBC: 0 % (ref 0.0–0.2)

## 2018-12-08 LAB — CK: Total CK: >50000 U/L — ABNORMAL HIGH (ref 49–397)

## 2018-12-08 LAB — TROPONIN I
Troponin I: 0.05 ng/mL (ref <0.03)
Troponin I: 0.06 ng/mL (ref <0.03)
Troponin I: 0.04 ng/mL (ref <0.03)

## 2018-12-08 LAB — VOLATILES,BLD-ACETONE,ETHANOL,ISOPROP,METHANOL
Acetone, blood: NEGATIVE % (ref 0.000–0.010)
Ethanol, blood: NEGATIVE % (ref 0.000–0.010)
Isopropanol, blood: NEGATIVE % (ref 0.000–0.010)
Methanol, blood: NEGATIVE % (ref 0.000–0.010)

## 2018-12-08 LAB — MAGNESIUM: Magnesium: 2.6 mg/dL — ABNORMAL HIGH (ref 1.7–2.4)

## 2018-12-08 LAB — PROCALCITONIN: Procalcitonin: 0.37 ng/mL

## 2018-12-08 LAB — ETHYLENE GLYCOL: Ethylene Glycol Lvl: <5 mg/dL

## 2018-12-08 MED ORDER — SODIUM CHLORIDE 0.9 % IV SOLN
2.0000 g | Freq: Two times a day (BID) | INTRAVENOUS | Status: DC
  Administered 2018-12-08: 2 g via INTRAVENOUS
  Filled 2018-12-08 (×2): qty 2

## 2018-12-08 MED ORDER — SODIUM CHLORIDE 0.9 % IV BOLUS
1000.0000 mL | Freq: Once | INTRAVENOUS | Status: AC
  Administered 2018-12-08: 1000 mL via INTRAVENOUS

## 2018-12-08 MED ORDER — ALPRAZOLAM 0.5 MG PO TABS
1.0000 mg | ORAL_TABLET | Freq: Three times a day (TID) | ORAL | Status: DC | PRN
  Administered 2018-12-08: 1 mg via ORAL
  Filled 2018-12-08: qty 2

## 2018-12-08 MED ORDER — DEXTROSE 10 % IV SOLN
INTRAVENOUS | Status: DC
  Administered 2018-12-08 – 2018-12-09 (×2): via INTRAVENOUS

## 2018-12-08 MED ORDER — HEPARIN SODIUM (PORCINE) 5000 UNIT/ML IJ SOLN
5000.0000 [IU] | Freq: Three times a day (TID) | INTRAMUSCULAR | Status: AC
  Filled 2018-12-08: qty 1

## 2018-12-08 MED ORDER — METHYLPHENIDATE HCL 5 MG PO TABS
5.0000 mg | ORAL_TABLET | Freq: Three times a day (TID) | ORAL | Status: DC
  Administered 2018-12-08 – 2018-12-15 (×23): 5 mg via ORAL
  Filled 2018-12-08 (×24): qty 1

## 2018-12-08 MED ORDER — DEXTROSE 50 % IV SOLN
INTRAVENOUS | Status: AC
  Administered 2018-12-08: 25 mL
  Filled 2018-12-08: qty 50

## 2018-12-08 MED ORDER — ALPRAZOLAM 0.5 MG PO TABS
1.0000 mg | ORAL_TABLET | Freq: Two times a day (BID) | ORAL | Status: DC
  Administered 2018-12-08 – 2018-12-10 (×5): 1 mg via ORAL
  Filled 2018-12-08 (×5): qty 2

## 2018-12-08 MED ORDER — ONDANSETRON HCL 4 MG PO TABS: 4.0000 mg | ORAL_TABLET | Freq: Four times a day (QID) | ORAL | Status: DC | PRN

## 2018-12-08 MED ORDER — BUSPIRONE HCL 5 MG PO TABS
15.0000 mg | ORAL_TABLET | Freq: Two times a day (BID) | ORAL | Status: DC
  Administered 2018-12-08 – 2018-12-15 (×14): 15 mg via ORAL
  Filled 2018-12-08 (×15): qty 3

## 2018-12-08 MED ORDER — HEPARIN SODIUM (PORCINE) 5000 UNIT/ML IJ SOLN
5000.0000 [IU] | Freq: Three times a day (TID) | INTRAMUSCULAR | Status: DC
  Administered 2018-12-09 – 2018-12-12 (×8): 5000 [IU] via SUBCUTANEOUS
  Filled 2018-12-08 (×8): qty 1

## 2018-12-08 MED ORDER — VANCOMYCIN VARIABLE DOSE PER UNSTABLE RENAL FUNCTION (PHARMACIST DOSING): Status: DC

## 2018-12-08 MED ORDER — ASPIRIN EC 81 MG PO TBEC
81.0000 mg | DELAYED_RELEASE_TABLET | Freq: Every day | ORAL | Status: DC
  Administered 2018-12-08 – 2018-12-15 (×8): 81 mg via ORAL
  Filled 2018-12-08 (×9): qty 1

## 2018-12-08 MED ORDER — ONDANSETRON HCL 4 MG/2ML IJ SOLN
4.0000 mg | Freq: Four times a day (QID) | INTRAMUSCULAR | Status: DC | PRN
  Administered 2018-12-11 – 2018-12-12 (×2): 4 mg via INTRAVENOUS
  Filled 2018-12-08 (×2): qty 2

## 2018-12-08 MED ORDER — DEXTROSE-NACL 5-0.9 % IV SOLN
INTRAVENOUS | Status: DC
  Administered 2018-12-08: 20:00:00 via INTRAVENOUS

## 2018-12-08 MED ORDER — SODIUM BICARBONATE 650 MG PO TABS
650.0000 mg | ORAL_TABLET | Freq: Three times a day (TID) | ORAL | Status: DC
  Administered 2018-12-08 – 2018-12-11 (×9): 650 mg via ORAL
  Filled 2018-12-08 (×9): qty 1

## 2018-12-08 MED ORDER — VANCOMYCIN HCL 10 G IV SOLR
1500.0000 mg | Freq: Once | INTRAVENOUS | Status: AC
  Administered 2018-12-08: 1500 mg via INTRAVENOUS
  Filled 2018-12-08: qty 1500

## 2018-12-08 MED ORDER — SODIUM CHLORIDE 0.9 % IV SOLN
INTRAVENOUS | Status: DC
  Administered 2018-12-08: 22:00:00 via INTRAVENOUS

## 2018-12-08 MED ORDER — SODIUM CHLORIDE 0.9 % IV SOLN
2.0000 g | Freq: Once | INTRAVENOUS | Status: AC
  Administered 2018-12-08: 2 g via INTRAVENOUS
  Filled 2018-12-08: qty 2

## 2018-12-08 MED ORDER — HEPARIN SODIUM (PORCINE) 5000 UNIT/ML IJ SOLN
5000.0000 [IU] | Freq: Three times a day (TID) | INTRAMUSCULAR | Status: DC
  Administered 2018-12-08: 5000 [IU] via SUBCUTANEOUS
  Filled 2018-12-08 (×2): qty 1

## 2018-12-08 MED ORDER — SODIUM CHLORIDE 0.9 % IV SOLN
INTRAVENOUS | Status: DC | PRN
  Administered 2018-12-08: 250 mL via INTRAVENOUS
  Administered 2018-12-10 – 2018-12-11 (×2): 1000 mL via INTRAVENOUS

## 2018-12-08 NOTE — Progress Notes (Signed)
Pharmacy Antibiotic Note  GRYPHON VANDERVEEN is a 48 y.o. male admitted on 12/07/2018 with sepsis.  Pharmacy has been consulted for vancomycin and cefepime dosing.  Pt w/ AKI; SCr 2.8; baseline SCr <1.  Plan: Will give vancomycin 1.5g IV x1 and monitor SCr prior to redosing. Will start cefepime 2g IV Q12H and monitor renal function for dose changes.  Height: 6\' 1"  (185.4 cm) Weight: 173 lb 9.6 oz (78.7 kg)(scale C) IBW/kg (Calculated) : 79.9  Temp (24hrs), Avg:98.7 F (37.1 C), Min:98.4 F (36.9 C), Max:99 F (37.2 C)  Recent Labs  Lab 12/07/18 1813 12/07/18 1940  WBC 23.5*  --   CREATININE 2.80*  --   LATICACIDVEN  --  1.5    Estimated Creatinine Clearance: 36.3 mL/min (A) (by C-G formula based on SCr of 2.8 mg/dL (H)).    Allergies  Allergen Reactions  . Penicillins Other (See Comments)    From childhood    Thank you for allowing pharmacy to be a part of this patient's care.  Wynona Neat, PharmD, BCPS  12/08/2018 12:56 AM

## 2018-12-08 NOTE — Progress Notes (Signed)
CRITICAL VALUE ALERT  Critical Value:  Troponin: 0.06  Date & Time Notied:  Nursing found, 12/08/2018, 8676  Provider Notified: [facility]  Orders Received/Actions taken: awaiting

## 2018-12-08 NOTE — Plan of Care (Signed)
  Problem: Education: Goal: Knowledge of General Education information will improve Description: Including pain rating scale, medication(s)/side effects and non-pharmacologic comfort measures Outcome: Not Progressing   Problem: Health Behavior/Discharge Planning: Goal: Ability to manage health-related needs will improve Outcome: Not Progressing   Problem: Clinical Measurements: Goal: Ability to maintain clinical measurements within normal limits will improve Outcome: Not Met (add Reason) Goal: Will remain free from infection Outcome: Not Met (add Reason) Goal: Diagnostic test results will improve Outcome: Not Met (add Reason) Goal: Respiratory complications will improve Outcome: Progressing Goal: Cardiovascular complication will be avoided Outcome: Progressing   Problem: Activity: Goal: Risk for activity intolerance will decrease Outcome: Progressing   Problem: Nutrition: Goal: Adequate nutrition will be maintained Outcome: Not Progressing   Problem: Coping: Goal: Level of anxiety will decrease Outcome: Progressing   Problem: Elimination: Goal: Will not experience complications related to bowel motility Outcome: Progressing Goal: Will not experience complications related to urinary retention Outcome: Progressing   Problem: Pain Managment: Goal: General experience of comfort will improve Outcome: Not Progressing   Problem: Safety: Goal: Ability to remain free from injury will improve Outcome: Not Progressing   Problem: Skin Integrity: Goal: Risk for impaired skin integrity will decrease Outcome: Progressing   Problem: Education: Goal: Ability to demonstrate management of disease process will improve Outcome: Not Progressing Goal: Ability to verbalize understanding of medication therapies will improve Outcome: Not Progressing Goal: Individualized Educational Video(s) Outcome: Not Met (add Reason)   Problem: Activity: Goal: Capacity to carry out activities  will improve Outcome: Progressing   Problem: Cardiac: Goal: Ability to achieve and maintain adequate cardiopulmonary perfusion will improve Outcome: Progressing

## 2018-12-08 NOTE — Progress Notes (Signed)
RN checked pt blood sugar because of pt being NPO. BS of 74. Orange juice given. BS went down to 67. More orange juice given. BS dropped to 62. Hypoglycemic protocol put in place. MD notified. Will continue to monitor pt.

## 2018-12-08 NOTE — Progress Notes (Addendum)
[facility] PROGRESS NOTE  Donald Jenkins DXI:[id number] DOB: Jun 24, 1971 DOA: 12/07/2018 PCP: Patient, No Pcp Per  Assessment/Plan:  Rhabdomyolysis:  Etiology unclear. No injury. Denies over Exercising. Does report a "dietary supplement for exercise". Unsure of name. His CK>50000 x2. Creatinine 2.8 on admission and 2.04 this am. IV fluids at 125/hr. Urine output 750 mls. No events on tele. Mag level 2.6 -will provide another liter bolus (received 3L in ED) -increase IV fluids to 150/hr  - repeat CK in AM -intake and output - avoid NSAIDs and tylenol given elevated AST and ALT  Acute metabolic encephalopathy: Improved this am. stat CT-head and C spin-->results showed no acute intracranial abnormalities or C-spine fracture. Neuro exam benign.  -Frequent neuro check -continue home meds which incude ritalin and xanax  Metabolic acidosis: Bicarbonate 19, anion gap 18 on admission and this am bicarb 14 and gap 14. Volatiles negative. Ethylene glycol level pending. -consider changing fluid type and/or giving bicarb -IVF as above -monitor  Essential hypertension, benign: fair control. holding Zestoretic due to AKI -continue  amlodipine 5 mg daily -IV hydralazine prn  Hypersomnolence disorder, with mental disorder, acute, moderate: -continue ritalin  SIRS vs. Sepsis: pt had WBC 23 and tachycardia, meeting criteria for SIRS vs. Sepsis on admission.  Lactic acid normal 1.5. No source of infection is identified. UA negative and CXR negative. WBC trending down. Remains afebrile and non-toxic appearing. Blood cultures in process. procalcitonin 0.37. lactic acid within limits of normal -will stop IV vancomycine and cefepime empirically now -f/y Bx and Ux -IVF as above  Back pain/T8 compression fracture: X-ray showed moderate T8 compression fracture, new since 2015 and likely acute in this clinical setting per report.  -consider MRI without contrast to assess candidacy for  vertebroplasty.  -prn oxycodone (can not NSIADs due to Aki, cannot use Tylenol due to abnormal LFT) -pt/OT  Depression with anxiety:  -continue home  Buspar and Xanax  Abnormal LFTs: Likely due to rhabdomyolysis. AST and ALT trending up. Alk phos and total bili within limits of normal. Hep panel in process.   -avoid using liver toxic meds, such as Tylenol -iv fluids -recheck in am  AKI (acute kidney injury) ([place]): likely due to rhabdo. Creatinine trending downward. See above -IVF as above -hold Zestoretic -monitor urine output  Elevated troponin: Troponin 0.05 x2 then 0.06 trending down 0.04 today.  No CP.  Likely due to demand ischemia. AKI may have contributed partially for elevated troponin. Repeat ekg with ST at 99. No acute changes - prn Morphine, and aspirin - Risk factor stratification: will check FLP and A1C     Code Status: full Family Communication: patient Disposition Plan: home hopefully tomorrow   Consultants:    Procedures:    Antibiotics:  vanc 6/10-6/11  Cefepime 6/10-6/11  HPI/Subjective: Lying in bed. Reports thirst and pain thorasic back with movement.   Objective: Vitals:   12/08/18 0811 12/08/18 1309  BP: (!) 133/92 127/82  Pulse: 72 69  Resp: 19 17  Temp: 98.3 F (36.8 C) 97.8 F (36.6 C)  SpO2: 100% 100%    Intake/Output Summary (Last 24 hours) at 12/08/2018 1351 Last data filed at 12/08/2018 1133 Gross per 24 hour  Intake 2840 ml  Output 750 ml  Net 2090 ml   Filed Weights   12/07/18 1737 12/08/18 0010  Weight: 77.1 kg 78.7 kg    Exam:   General:  Awake alert slightly anxious  Cardiovascular: tachycardia but regular no mgr no LE edema  Respiratory:  normal effort BS clear bilaterally no wheeze  Abdomen: non-distended non-tender +BS no guarding or rebounding  Musculoskeletal: joints without swelling/erythema   Data Reviewed: Basic Metabolic Panel: Recent Labs  Lab 12/07/18 1813 12/08/18 0349  NA 138 136   K 4.3 4.1  CL 101 108  CO2 19* 14*  GLUCOSE 113* 90  BUN 55* 47*  CREATININE 2.80* 2.04*  CALCIUM 9.4 8.1*  MG  --  2.6*   Liver Function Tests: Recent Labs  Lab 12/07/18 1813 12/08/18 0349  AST 684* 775*  ALT 140* 161*  ALKPHOS 68 54  BILITOT 1.2 1.0  PROT 8.2* 6.5  ALBUMIN 4.7 3.6   No results for input(s): LIPASE, AMYLASE in the last 168 hours. No results for input(s): AMMONIA in the last 168 hours. CBC: Recent Labs  Lab 12/07/18 1813 12/08/18 0349  WBC 23.5* 16.0*  NEUTROABS 20.8*  --   HGB 17.1* 13.1  HCT 48.7 38.1*  MCV 89.0 91.6  PLT 293 214   Cardiac Enzymes: Recent Labs  Lab 12/07/18 1813 12/08/18 0108 12/08/18 0349 12/08/18 1142  CKTOTAL >50,000*  --  >50,000*  --   TROPONINI 0.05* 0.05* 0.06* 0.04*   BNP (last 3 results) No results for input(s): BNP in the last 8760 hours.  ProBNP (last 3 results) No results for input(s): PROBNP in the last 8760 hours.  CBG: Recent Labs  Lab 12/08/18 0632 12/08/18 0649 12/08/18 0701 12/08/18 0808  GLUCAP 74 67* 62* 131*    Recent Results (from the past 240 hour(s))  SARS Coronavirus 2 (CEPHEID - Performed in [place] hospital lab), Hosp Order     Status: None   Collection Time: 12/07/18 10:36 PM   Specimen: Nasopharyngeal Swab  Result Value Ref Range Status   SARS Coronavirus 2 NEGATIVE NEGATIVE Final    Comment: (NOTE) If result is NEGATIVE SARS-CoV-2 target nucleic acids are NOT DETECTED. The SARS-CoV-2 RNA is generally detectable in upper and lower  respiratory specimens during the acute phase of infection. The lowest  concentration of SARS-CoV-2 viral copies this assay can detect is 250  copies / mL. A negative result does not preclude SARS-CoV-2 infection  and should not be used as the sole basis for treatment or other  patient management decisions.  A negative result may occur with  improper specimen collection / handling, submission of specimen other  than nasopharyngeal swab,  presence of viral mutation(s) within the  areas targeted by this assay, and inadequate number of viral copies  (<250 copies / mL). A negative result must be combined with clinical  observations, patient history, and epidemiological information. If result is POSITIVE SARS-CoV-2 target nucleic acids are DETECTED. The SARS-CoV-2 RNA is generally detectable in upper and lower  respiratory specimens dur ing the acute phase of infection.  Positive  results are indicative of active infection with SARS-CoV-2.  Clinical  correlation with patient history and other diagnostic information is  necessary to determine patient infection status.  Positive results do  not rule out bacterial infection or co-infection with other viruses. If result is PRESUMPTIVE POSTIVE SARS-CoV-2 nucleic acids MAY BE PRESENT.   A presumptive positive result was obtained on the submitted specimen  and confirmed on repeat testing.  While 2019 novel coronavirus  (SARS-CoV-2) nucleic acids may be present in the submitted sample  additional confirmatory testing may be necessary for epidemiological  and / or clinical management purposes  to differentiate between  SARS-CoV-2 and other Sarbecovirus currently known to infect humans.  If  clinically indicated additional testing with an alternate test  methodology [phone]) is advised. The SARS-CoV-2 RNA is generally  detectable in upper and lower respiratory sp ecimens during the acute  phase of infection. The expected result is Negative. Fact Sheet for Patients:  StrictlyIdeas.no Fact Sheet for Healthcare Providers: BankingDealers.co.za This test is not yet approved or cleared by the [place] FDA and has been authorized for detection and/or diagnosis of SARS-CoV-2 by FDA under an Emergency Use Authorization (EUA).  This EUA will remain in effect (meaning this test can be used) for the duration of the COVID-19 declaration under  Section 564(b)(1) of the Act, 21 U.S.C. section 360bbb-3(b)(1), unless the authorization is terminated or revoked sooner. Performed at [facility], [place] [address], [place], [place] [address]      Studies: Dg Thoracic Spine 2 View  Result Date: 12/08/2018 CLINICAL DATA:  48 year old male status post fall.  Pain. EXAM: THORACIC SPINE 2 VIEWS COMPARISON:  Cervical spine CT today reported separately. Chest radiographs 07/02/2013. FINDINGS: Normal thoracic segmentation. There is a moderate compression fracture of T8 which is new since 2015. At other levels thoracic vertebral height and alignment appears preserved. Cervicothoracic junction alignment is within normal limits. Preserved disc spaces. Posterior ribs appear intact. Negative visible chest and abdominal visceral contours. IMPRESSION: Moderate T8 compression fracture, new since 2015 and likely acute in this clinical setting. If specific therapy is desired, Thoracic MRI without contrast or Nuclear Medicine Whole-body Bone Scan would confirm candidacy for vertebroplasty. Electronically Signed   By: Genevie Ann M.D.   On: 12/08/2018 02:10   Ct Head Wo Contrast  Result Date: 12/08/2018 CLINICAL DATA:  48 year old male status post fall. Altered mental status. EXAM: CT HEAD WITHOUT CONTRAST CT CERVICAL SPINE WITHOUT CONTRAST TECHNIQUE: Multidetector CT imaging of the head and cervical spine was performed following the standard protocol without intravenous contrast. Multiplanar CT image reconstructions of the cervical spine were also generated. COMPARISON:  None. FINDINGS: CT HEAD FINDINGS Brain: No midline shift, ventriculomegaly, mass effect, evidence of mass lesion, intracranial hemorrhage or evidence of cortically based acute infarction. Gray-white matter differentiation is within normal limits throughout the brain. Vascular: Mild Calcified atherosclerosis at the skull base. No suspicious intracranial vascular hyperdensity. Skull: Negative.  Sinuses/Orbits: Visualized paranasal sinuses and mastoids are clear. Other: Visualized orbits and scalp soft tissues are within normal limits. CT CERVICAL SPINE FINDINGS Alignment: Relatively preserved cervical lordosis. Levoconvex cervical spine curvature or scoliosis. Cervicothoracic junction alignment is within normal limits. Bilateral posterior element alignment is within normal limits. Skull base and vertebrae: Visualized skull base is intact. No atlanto-occipital dissociation. Motion artifact at C4 and C5, with repeat imaging through those levels. No cervical spine fracture identified. Benign appearing sclerosis of the left C4 facet. Normal bone mineralization otherwise. Soft tissues and spinal canal: No prevertebral fluid or swelling. No visible canal hematoma. Calcified carotid atherosclerosis greater on the left. Disc levels: Disc space loss with endplate spurring at E7-N1 and C6-C7. Upper chest: Negative visible upper thoracic levels and lung apices. IMPRESSION: 1. Normal noncontrast CT appearance of the brain. 2. No acute traumatic injury identified in the head or cervical spine. Electronically Signed   By: Genevie Ann M.D.   On: 12/08/2018 02:07   Ct Cervical Spine Wo Contrast  Result Date: 12/08/2018 CLINICAL DATA:  48 year old male status post fall. Altered mental status. EXAM: CT HEAD WITHOUT CONTRAST CT CERVICAL SPINE WITHOUT CONTRAST TECHNIQUE: Multidetector CT imaging of the head and cervical spine was performed following the  standard protocol without intravenous contrast. Multiplanar CT image reconstructions of the cervical spine were also generated. COMPARISON:  None. FINDINGS: CT HEAD FINDINGS Brain: No midline shift, ventriculomegaly, mass effect, evidence of mass lesion, intracranial hemorrhage or evidence of cortically based acute infarction. Gray-white matter differentiation is within normal limits throughout the brain. Vascular: Mild Calcified atherosclerosis at the skull base. No  suspicious intracranial vascular hyperdensity. Skull: Negative. Sinuses/Orbits: Visualized paranasal sinuses and mastoids are clear. Other: Visualized orbits and scalp soft tissues are within normal limits. CT CERVICAL SPINE FINDINGS Alignment: Relatively preserved cervical lordosis. Levoconvex cervical spine curvature or scoliosis. Cervicothoracic junction alignment is within normal limits. Bilateral posterior element alignment is within normal limits. Skull base and vertebrae: Visualized skull base is intact. No atlanto-occipital dissociation. Motion artifact at C4 and C5, with repeat imaging through those levels. No cervical spine fracture identified. Benign appearing sclerosis of the left C4 facet. Normal bone mineralization otherwise. Soft tissues and spinal canal: No prevertebral fluid or swelling. No visible canal hematoma. Calcified carotid atherosclerosis greater on the left. Disc levels: Disc space loss with endplate spurring at O6-V6 and C6-C7. Upper chest: Negative visible upper thoracic levels and lung apices. IMPRESSION: 1. Normal noncontrast CT appearance of the brain. 2. No acute traumatic injury identified in the head or cervical spine. Electronically Signed   By: Genevie Ann M.D.   On: 12/08/2018 02:07   Dg Chest Portable 1 View  Result Date: 12/07/2018 CLINICAL DATA:  Back pain EXAM: PORTABLE CHEST 1 VIEW COMPARISON:  Chest x-ray dated 02/12/2014. FINDINGS: The heart size and mediastinal contours are within normal limits. Both lungs are clear. The visualized skeletal structures are unremarkable. There are probable old healed anterior rib fractures on the right. IMPRESSION: No active disease. Electronically Signed   By: Constance Holster M.D.   On: 12/07/2018 22:05    Scheduled Meds: . ALPRAZolam  1 mg Oral BID  . amLODipine  5 mg Oral Daily  . aspirin EC  81 mg Oral Daily  . busPIRone  15 mg Oral BID  . heparin  5,000 Units Subcutaneous Q8H  . methylphenidate  5 mg Oral TID WC    Continuous Infusions: . sodium chloride 150 mL/hr at 12/08/18 1204  . sodium chloride 250 mL (12/08/18 0300)  . sodium chloride 999 mL/hr at 12/08/18 7209    Principal Problem:   Rhabdomyolysis Active Problems:   Essential hypertension, benign   SIRS (systemic inflammatory response syndrome) ([facility])   AKI (acute kidney injury) ([place])   Acute metabolic encephalopathy   Metabolic acidosis   Compression fracture of T8 vertebra ([facility])   Hypersomnolence disorder, with mental disorder, acute, moderate   Back pain   Depression with anxiety   Abnormal LFTs   Elevated troponin    Time spent: 45 minutes    [place] NP  [facility]  If 7PM-7AM, please contact night-coverage at [email or web address], password [facility] 12/08/2018, 1:51 PM  LOS: 0 days

## 2018-12-08 NOTE — Progress Notes (Addendum)
Pt has arrived to [facility]. Pt is agitated and frustrated that his mother cannot come up to visit and he wants a coke. Pt educated on this being a heart failure floor. Pt still trying to get out of bed but is unsteady. Pt educated about bed alarm but refused.  Will continue to monitor pt.

## 2018-12-08 NOTE — Evaluation (Signed)
Physical Therapy Evaluation Patient Details Name: Donald Jenkins MRN: [id number] DOB: [birth year] Today's Date: 12/08/2018   History of Present Illness  Pt is a 48 y/o male admitted after being found on the floor of his home. Reporting increased back pain. PT with rhabdomyolysis and T8 compression fx. PMH includes HTN, anxiety, and hypersolomnence disorder.   Clinical Impression  Pt admitted secondary to problem above with deficits below. Pt easily distracted throughout session and very fixated on back pain. Pt requiring supervision for short distance ambulation in the room. Further mobility limited secondary to pain. Educated about maintaining back precautions to help with pain. During session, pt's girlfriend calling and asking about transfer to another hospital. Educated to call front desk to speak with RN about concerns. Will continue to follow acutely to maximize functional mobility independence and safety.     Follow Up Recommendations Outpatient PT    Equipment Recommendations  None recommended by PT    Recommendations for Other Services       Precautions / Restrictions Precautions Precautions: Back Precaution Booklet Issued: No Precaution Comments: reviewed back precautions to help with pain management.  Restrictions Weight Bearing Restrictions: No      Mobility  Bed Mobility               General bed mobility comments: Sitting EOB upon entry.   Transfers Overall transfer level: Needs assistance Equipment used: None Transfers: Sit to/from Stand Sit to Stand: Supervision         General transfer comment: Supervision for safety. Very slow to rise to full upright secondary to pain.   Ambulation/Gait Ambulation/Gait assistance: Supervision Gait Distance (Feet): 20 Feet Assistive device: None Gait Pattern/deviations: Step-through pattern;Decreased stride length Gait velocity: Decreased   General Gait Details: Slow, very guarded gait. Distance limited  within the room secondary to pain. Pt very slow to move and easily distracted. Required cues to wash hands after toileting and pt stood at sink ~5 mins, squatting down to try and help with back pain. Pt distracted and forgetting to wash hands.   Stairs            Wheelchair Mobility    Modified Rankin (Stroke Patients Only)       Balance Overall balance assessment: Needs assistance Sitting-balance support: No upper extremity supported;Feet supported Sitting balance-Leahy Scale: Good     Standing balance support: No upper extremity supported;During functional activity Standing balance-Leahy Scale: Fair                               Pertinent Vitals/Pain Pain Assessment: Faces Faces Pain Scale: Hurts even more Pain Location: back Pain Descriptors / Indicators: Grimacing;Guarding;Sharp Pain Intervention(s): Limited activity within patient's tolerance;Monitored during session;Repositioned    Home Living Family/patient expects to be discharged to:: Private residence Living Arrangements: Alone Available Help at Discharge: Family;Available PRN/intermittently Type of Home: House Home Access: Stairs to enter Entrance Stairs-Rails: Left Entrance Stairs-Number of Steps: flight Home Layout: One level Home Equipment: None Additional Comments: Pt reports his mom is staying in a motel near by. Did not know how long she would be staying.     Prior Function Level of Independence: Independent               Hand Dominance        Extremity/Trunk Assessment   Upper Extremity Assessment Upper Extremity Assessment: Overall WFL for tasks assessed    Lower Extremity Assessment Lower Extremity Assessment:  RLE deficits/detail;LLE deficits/detail RLE Deficits / Details: Pt reports pain throughout LEs. ROM WFL.  LLE Deficits / Details: Pt reports pain throughout LEs. ROM WFL.     Cervical / Trunk Assessment Cervical / Trunk Assessment: Other exceptions Cervical  / Trunk Exceptions: thoracic compression fx.   Communication   Communication: No difficulties  Cognition Arousal/Alertness: Awake/alert Behavior During Therapy: Restless Overall Cognitive Status: No family/caregiver present to determine baseline cognitive functioning                                 General Comments: Pt very restless. Easily looses train of thought. Pt with psych history. Kept repeating "I've apologized to everyone" throughout session. Unsure of baseline.       General Comments General comments (skin integrity, edema, etc.): Pt's girlfriend on the phone at end of session and inquiring about transferring pt to another hospital. Educated to speak with RN or MD and gave phone number.     Exercises     Assessment/Plan    PT Assessment Patient needs continued PT services  PT Problem List Decreased mobility;Decreased activity tolerance;Decreased cognition;Decreased safety awareness;Decreased knowledge of precautions;Pain       PT Treatment Interventions Gait training;Stair training;Functional mobility training;Therapeutic activities;Therapeutic exercise;Balance training;Patient/family education;Cognitive remediation    PT Goals (Current goals can be found in the Care Plan section)  Acute Rehab PT Goals Patient Stated Goal: for pain to get better PT Goal Formulation: With patient Time For Goal Achievement: 12/22/18 Potential to Achieve Goals: Good    Frequency Min 3X/week   Barriers to discharge Decreased caregiver support      Co-evaluation               AM-PAC PT "6 Clicks" Mobility  Outcome Measure Help needed turning from your back to your side while in a flat bed without using bedrails?: None Help needed moving from lying on your back to sitting on the side of a flat bed without using bedrails?: A Little Help needed moving to and from a bed to a chair (including a wheelchair)?: None Help needed standing up from a chair using your arms  (e.g., wheelchair or bedside chair)?: None Help needed to walk in hospital room?: A Little Help needed climbing 3-5 steps with a railing? : A Little 6 Click Score: 21    End of Session   Activity Tolerance: Patient limited by pain Patient left: in bed;with call bell/phone within reach(sitting EOB ) Nurse Communication: Mobility status PT Visit Diagnosis: Other abnormalities of gait and mobility (R26.89);Pain Pain - part of body: (back)    Time: 1356-1430 PT Time Calculation (min) (ACUTE ONLY): 34 min   Charges:   PT Evaluation $PT Eval Moderate Complexity: 1 Mod PT Treatments $Gait Training: 8-22 mins        Leighton Ruff, PT, DPT  Acute Rehabilitation Services  Pager: [phone] Office: [phone]   Rudean Hitt 12/08/2018, 3:09 PM

## 2018-12-08 NOTE — Progress Notes (Signed)
CRITICAL VALUE ALERT  Critical Value:  Troponin: 0.05  Date & Time Notied:  Nurse found, 12/08/2018, 0335   Provider Notified: Raliegh Ip Schorr  Orders Received/Actions taken: awaiting

## 2018-12-08 NOTE — Progress Notes (Signed)
Pt refusing bed alarm.

## 2018-12-08 NOTE — Progress Notes (Signed)
Pt blood sugar dropped down to 42. MD paged. Dextrose given. Will continue to monitor pt.

## 2018-12-08 NOTE — Progress Notes (Signed)
Pt is alert and oriented to self, time, place but not situation. Pt states "If you dont get me water I will try and walk to the sink myself". Pt educated on NPO status but still refusing. MD notified. Will continue to monitor pt.

## 2018-12-08 NOTE — Progress Notes (Signed)
Blood sugar has dropped back down to 59. MD notified. Will continue to monitor.

## 2018-12-08 NOTE — Plan of Care (Signed)
  Problem: Education: Goal: Knowledge of General Education information will improve Description: Including pain rating scale, medication(s)/side effects and non-pharmacologic comfort measures Outcome: Not Progressing   Problem: Health Behavior/Discharge Planning: Goal: Ability to manage health-related needs will improve Outcome: Not Progressing   Problem: Clinical Measurements: Goal: Ability to maintain clinical measurements within normal limits will improve Outcome: Not Progressing Goal: Will remain free from infection Outcome: Not Progressing Goal: Diagnostic test results will improve Outcome: Not Met (add Reason) Goal: Respiratory complications will improve Outcome: Progressing Goal: Cardiovascular complication will be avoided Outcome: Progressing   Problem: Activity: Goal: Risk for activity intolerance will decrease Outcome: Progressing   Problem: Nutrition: Goal: Adequate nutrition will be maintained Outcome: Progressing   Problem: Coping: Goal: Level of anxiety will decrease Outcome: Not Progressing   Problem: Elimination: Goal: Will not experience complications related to bowel motility Outcome: Progressing Goal: Will not experience complications related to urinary retention Outcome: Progressing   Problem: Pain Managment: Goal: General experience of comfort will improve Outcome: Not Progressing   Problem: Safety: Goal: Ability to remain free from injury will improve Outcome: Not Progressing   Problem: Skin Integrity: Goal: Risk for impaired skin integrity will decrease Outcome: Progressing   Problem: Education: Goal: Ability to demonstrate management of disease process will improve Outcome: Not Progressing Goal: Ability to verbalize understanding of medication therapies will improve Outcome: Not Progressing Goal: Individualized Educational Video(s) Outcome: Not Met (add Reason)   Problem: Activity: Goal: Capacity to carry out activities will  improve Outcome: Progressing   Problem: Cardiac: Goal: Ability to achieve and maintain adequate cardiopulmonary perfusion will improve Outcome: Progressing

## 2018-12-08 NOTE — Progress Notes (Signed)
Despite of being NPO RN caught pt drinking water out of sink. Pt educated and still refuses. Will continue to monitor.

## 2018-12-08 NOTE — Progress Notes (Signed)
Pt still refusing to be NPO. Pt has gotten up to the sink and started drinking out of the sink. Pt educated but still refuses. Will continue to monitor pt.

## 2018-12-09 ENCOUNTER — Inpatient Hospital Stay (HOSPITAL_COMMUNITY): Payer: BC Managed Care – PPO

## 2018-12-09 DIAGNOSIS — E162 Hypoglycemia, unspecified: Secondary | ICD-10-CM

## 2018-12-09 DIAGNOSIS — R7881 Bacteremia: Secondary | ICD-10-CM

## 2018-12-09 LAB — BLOOD CULTURE ID PANEL (REFLEXED)

## 2018-12-09 LAB — CBC
HCT: 33.2 % — ABNORMAL LOW (ref 39.0–52.0)
Hemoglobin: 11.8 g/dL — ABNORMAL LOW (ref 13.0–17.0)
MCH: 31.3 pg (ref 26.0–34.0)
MCHC: 35.5 g/dL (ref 30.0–36.0)
MCV: 88.1 fL (ref 80.0–100.0)
Platelets: 188 10*3/uL (ref 150–400)
RBC: 3.77 MIL/uL — ABNORMAL LOW (ref 4.22–5.81)
RDW: 11.9 % (ref 11.5–15.5)
WBC: 7.2 10*3/uL (ref 4.0–10.5)
nRBC: 0 % (ref 0.0–0.2)

## 2018-12-09 LAB — COMPREHENSIVE METABOLIC PANEL
ALT: 206 U/L — ABNORMAL HIGH (ref 0–44)
AST: 629 U/L — ABNORMAL HIGH (ref 15–41)
Albumin: 2.7 g/dL — ABNORMAL LOW (ref 3.5–5.0)
Alkaline Phosphatase: 37 U/L — ABNORMAL LOW (ref 38–126)
Anion gap: 8 (ref 5–15)
BUN: 21 mg/dL — ABNORMAL HIGH (ref 6–20)
CO2: 21 mmol/L — ABNORMAL LOW (ref 22–32)
Calcium: 8.4 mg/dL — ABNORMAL LOW (ref 8.9–10.3)
Chloride: 108 mmol/L (ref 98–111)
Creatinine, Ser: 1.12 mg/dL (ref 0.61–1.24)
GFR calc Af Amer: >60 mL/min (ref >60)
GFR calc non Af Amer: >60 mL/min (ref >60)
Glucose, Bld: 109 mg/dL — ABNORMAL HIGH (ref 70–99)
Potassium: 3.7 mmol/L (ref 3.5–5.1)
Sodium: 137 mmol/L (ref 135–145)
Total Bilirubin: 0.8 mg/dL (ref 0.3–1.2)
Total Protein: 4.9 g/dL — ABNORMAL LOW (ref 6.5–8.1)

## 2018-12-09 LAB — GLUCOSE, CAPILLARY
Glucose-Capillary: 111 mg/dL — ABNORMAL HIGH (ref 70–99)
Glucose-Capillary: 111 mg/dL — ABNORMAL HIGH (ref 70–99)
Glucose-Capillary: 69 mg/dL — ABNORMAL LOW (ref 70–99)
Glucose-Capillary: 70 mg/dL (ref 70–99)
Glucose-Capillary: 86 mg/dL (ref 70–99)
Glucose-Capillary: 88 mg/dL (ref 70–99)
Glucose-Capillary: 95 mg/dL (ref 70–99)
Glucose-Capillary: 96 mg/dL (ref 70–99)
Glucose-Capillary: 97 mg/dL (ref 70–99)
Glucose-Capillary: 98 mg/dL (ref 70–99)
Glucose-Capillary: 99 mg/dL (ref 70–99)

## 2018-12-09 LAB — HEPATITIS PANEL, ACUTE
HCV Ab: <0.1 s/co ratio (ref 0.0–0.9)
Hep A IgM: NEGATIVE
Hep B C IgM: NEGATIVE
Hepatitis B Surface Ag: NEGATIVE

## 2018-12-09 LAB — URINE CULTURE: Culture: NO GROWTH

## 2018-12-09 LAB — HIV ANTIBODY (ROUTINE TESTING W REFLEX): HIV Screen 4th Generation wRfx: NONREACTIVE

## 2018-12-09 LAB — CK: Total CK: 45248 U/L — ABNORMAL HIGH (ref 49–397)

## 2018-12-09 MED ORDER — SODIUM CHLORIDE 0.9 % IV SOLN
2.0000 g | INTRAVENOUS | Status: DC
  Administered 2018-12-09 – 2018-12-12 (×4): 2 g via INTRAVENOUS
  Filled 2018-12-09 (×4): qty 20

## 2018-12-09 MED ORDER — DEXTROSE IN LACTATED RINGERS 5 % IV SOLN
INTRAVENOUS | Status: DC
  Administered 2018-12-09 – 2018-12-10 (×4): via INTRAVENOUS
  Administered 2018-12-11: 1000 mL via INTRAVENOUS
  Administered 2018-12-11 – 2018-12-13 (×5): via INTRAVENOUS

## 2018-12-09 NOTE — Evaluation (Addendum)
Occupational Therapy Evaluation Patient Details Name: Donald Jenkins MRN: [id number] DOB: 11-13-1970 Today's Date: 12/09/2018    History of Present Illness Pt is a 48 y/o male admitted after being found on the floor of his home. Reporting increased back pain. PT with rhabdomyolysis and T8 compression fx. PMH includes HTN, anxiety, and hypersolomnence disorder.    Clinical Impression   Pt admitted with the above diagnoses and presents with below problem list. Pt will benefit from continued acute OT to address the below listed deficits and maximize independence with basic ADLs prior to d/c home. PTA pt was independent with ADLs. Pt currently setup to minguard with LB ADLs. Instructed in technique and AE for LB ADLs and pericare to avoid bending and twisting.      Follow Up Recommendations  No OT follow up    Equipment Recommendations  3n1 bedside commode     Recommendations for Other Services       Precautions / Restrictions Precautions Precautions: Back Precaution Booklet Issued: Yes (comment) Precaution Comments: reviewed back precautions to help with pain management.  Restrictions Weight Bearing Restrictions: No      Mobility Bed Mobility Overal bed mobility: Needs Assistance Bed Mobility: Sit to Sidelying;Rolling Rolling: Supervision Sidelying to sit: Min assist     Sit to sidelying: Min guard General bed mobility comments: cues for technique  Transfers Overall transfer level: Needs assistance Equipment used: None Transfers: Sit to/from Stand Sit to Stand: Supervision         General transfer comment: pt able to stand without assist or cues at EOB for urinal    Balance Overall balance assessment: Mild deficits observed, not formally tested Sitting-balance support: No upper extremity supported;Feet supported Sitting balance-Leahy Scale: Good     Standing balance support: No upper extremity supported;During functional activity Standing balance-Leahy  Scale: Fair                             ADL either performed or assessed with clinical judgement   ADL Overall ADL's : Needs assistance/impaired Eating/Feeding: Set up;Sitting   Grooming: Standing;Supervision/safety   Upper Body Bathing: Set up;Sitting   Lower Body Bathing: Supervison/ safety;Cueing for back precautions;Sit to/from stand   Upper Body Dressing : Set up;Sitting   Lower Body Dressing: Supervision/safety;Cueing for back precautions;Sit to/from stand   Toilet Transfer: Supervision/safety;Ambulation   Toileting- Clothing Manipulation and Hygiene: Supervision/safety;Sit to/from stand   Tub/ Shower Transfer: Supervision/safety;Ambulation   Functional mobility during ADLs: Supervision/safety General ADL Comments: Educated on technique and AE for back precautions     Vision         Perception     Praxis      Pertinent Vitals/Pain Pain Assessment: Faces Faces Pain Scale: Hurts even more Pain Location: back with movement Pain Descriptors / Indicators: Aching;Guarding;Grimacing Pain Intervention(s): Monitored during session;Repositioned;Limited activity within patient's tolerance     Hand Dominance     Extremity/Trunk Assessment Upper Extremity Assessment Upper Extremity Assessment: Overall WFL for tasks assessed   Lower Extremity Assessment Lower Extremity Assessment: Defer to PT evaluation   Cervical / Trunk Assessment Cervical / Trunk Assessment: Other exceptions Cervical / Trunk Exceptions: thoracic compression fx.    Communication Communication Communication: No difficulties   Cognition Arousal/Alertness: Awake/alert Behavior During Therapy: WFL for tasks assessed/performed Overall Cognitive Status: No family/caregiver present to determine baseline cognitive functioning  General Comments: A&O. following commands. conversational.   General Comments       Exercises     Shoulder  Instructions      Home Living Family/patient expects to be discharged to:: Private residence Living Arrangements: Alone Available Help at Discharge: Family;Available PRN/intermittently Type of Home: House Home Access: Stairs to enter [employer] of Steps: flight Entrance Stairs-Rails: Left Home Layout: One level               Home Equipment: None   Additional Comments: Pt reports his mom is staying in a motel near by. Did not know how long she would be staying.       Prior Functioning/Environment Level of Independence: Independent                 OT Problem List: Impaired balance (sitting and/or standing);Decreased knowledge of use of DME or AE;Decreased knowledge of precautions;Pain      OT Treatment/Interventions: Self-care/ADL training;DME and/or AE instruction;Therapeutic activities;Patient/family education;Balance training    OT Goals(Current goals can be found in the care plan section) Acute Rehab OT Goals Patient Stated Goal: for pain to get better OT Goal Formulation: With patient Time For Goal Achievement: 12/16/18 Potential to Achieve Goals: Good ADL Goals Pt Will Perform Lower Body Bathing: with modified independence;sit to/from stand Pt Will Perform Lower Body Dressing: with modified independence;sit to/from stand Pt Will Transfer to Toilet: Independently Pt Will Perform Toileting - Clothing Manipulation and hygiene: Independently Pt Will Perform Tub/Shower Transfer: Independently;ambulating  OT Frequency: Min 2X/week   Barriers to D/C:            Co-evaluation              AM-PAC OT "6 Clicks" Daily Activity     Outcome Measure Help from another person eating meals?: None Help from another person taking care of personal grooming?: None Help from another person toileting, which includes using toliet, bedpan, or urinal?: None Help from another person bathing (including washing, rinsing, drying)?: None Help from another person  to put on and taking off regular upper body clothing?: None Help from another person to put on and taking off regular lower body clothing?: None 6 Click Score: 24   End of Session    Activity Tolerance: Patient tolerated treatment well Patient left: in bed;with call bell/phone within reach;with bed alarm set  OT Visit Diagnosis: Pain                Time: 9326-7124 OT Time Calculation (min): 22 min Charges:  OT General Charges $OT Visit: 1 Visit OT Evaluation $OT Eval Low Complexity: [place], OT Acute Rehabilitation Services Pager: [phone] Office: [phone]   Hortencia Pilar 12/09/2018, 12:24 PM

## 2018-12-09 NOTE — Progress Notes (Signed)
Physical Therapy Treatment Patient Details Name: Donald Jenkins MRN: [id number] DOB: 1970-11-15 Today's Date: 12/09/2018    History of Present Illness Pt is a 48 y/o male admitted after being found on the floor of his home. Reporting increased back pain. PT with rhabdomyolysis and T8 compression fx. PMH includes HTN, anxiety, and hypersolomnence disorder.     PT Comments    Pt pleasant supine on arrival and reports no pain at rest in bed or sitting in chair end of session. However, pt reports 10/10 pain with all movement and deep breaths with ability to continue gait and stairs this session without medication. Pt educated for back precautions and mobility with handout provided and will continue to benefit from mobility to return to independent function.     Follow Up Recommendations  Outpatient PT(if back pain persists)     Equipment Recommendations  None recommended by PT    Recommendations for Other Services       Precautions / Restrictions Precautions Precautions: Fall Precaution Booklet Issued: Yes (comment) Precaution Comments: educated for back precautions with handout provided Restrictions Weight Bearing Restrictions: No    Mobility  Bed Mobility Overal bed mobility: Needs Assistance Bed Mobility: Rolling;Sidelying to Sit Rolling: Supervision Sidelying to sit: Min assist       General bed mobility comments: cues for sequence to utilize back precautions, use of rail and assist to elevate trunk  Transfers Overall transfer level: Modified independent Equipment used: None Transfers: Sit to/from Stand Sit to Stand: Supervision         General transfer comment: pt able to stand without assist or cues at EOB for urinal  Ambulation/Gait Ambulation/Gait assistance: Supervision Gait Distance (Feet): 500 Feet Assistive device: None Gait Pattern/deviations: Step-through pattern;Decreased stride length   Gait velocity interpretation: >2.62 ft/sec, indicative  of community ambulatory General Gait Details: pt with decreased speed with steady gait and able to complete long hall ambulation. pt cued for scapular depression and relaxed arm swing. Pt reports pain with movement but able to walk without assist   Stairs Stairs: Yes Stairs assistance: Modified independent (Device/Increase time) Stair Management: One rail Right;Alternating pattern;Forwards Number of Stairs: 5 General stair comments: pt completed stairs safely with use of rail   Wheelchair Mobility    Modified Rankin (Stroke Patients Only)       Balance Overall balance assessment: Mild deficits observed, not formally tested Sitting-balance support: No upper extremity supported;Feet supported Sitting balance-Leahy Scale: Good     Standing balance support: No upper extremity supported;During functional activity Standing balance-Leahy Scale: Fair                              Cognition Arousal/Alertness: Awake/alert Behavior During Therapy: WFL for tasks assessed/performed Overall Cognitive Status: No family/caregiver present to determine baseline cognitive functioning                                 General Comments: pt following all commands but remains insistent he didn't fall but unable to state how he ended up on floor at home      Exercises      General Comments        Pertinent Vitals/Pain Faces Pain Scale: Hurts worst Pain Location: back with movement Pain Descriptors / Indicators: Aching;Guarding;Grimacing Pain Intervention(s): Limited activity within patient's tolerance;Monitored during session;Patient requesting pain meds-RN notified;Repositioned    Home Living Family/patient  expects to be discharged to:: Private residence Living Arrangements: Alone Available Help at Discharge: Family;Available PRN/intermittently Type of Home: House Home Access: Stairs to enter Entrance Stairs-Rails: Left Home Layout: One level Home Equipment:  None Additional Comments: Pt reports his mom is staying in a motel near by. Did not know how long she would be staying.     Prior Function Level of Independence: Independent          PT Goals (current goals can now be found in the care plan section) Acute Rehab PT Goals Patient Stated Goal: for pain to get better Progress towards PT goals: Progressing toward goals    Frequency           PT Plan Current plan remains appropriate    Co-evaluation              AM-PAC PT "6 Clicks" Mobility   Outcome Measure  Help needed turning from your back to your side while in a flat bed without using bedrails?: A Little Help needed moving from lying on your back to sitting on the side of a flat bed without using bedrails?: A Little Help needed moving to and from a bed to a chair (including a wheelchair)?: A Little Help needed standing up from a chair using your arms (e.g., wheelchair or bedside chair)?: A Little Help needed to walk in hospital room?: None Help needed climbing 3-5 steps with a railing? : None 6 Click Score: 20    End of Session Equipment Utilized During Treatment: Gait belt Activity Tolerance: Patient tolerated treatment well Patient left: in chair;with call bell/phone within reach;with chair alarm set;with nursing/sitter in room Nurse Communication: Mobility status PT Visit Diagnosis: Other abnormalities of gait and mobility (R26.89);Pain     Time: 3143-8887 PT Time Calculation (min) (ACUTE ONLY): 27 min  Charges:  $Gait Training: 8-22 mins $Therapeutic Activity: 8-22 mins                     [place], PT Acute Rehabilitation Services Pager: [phone] Office: [place] 12/09/2018, 12:04 PM

## 2018-12-09 NOTE — Progress Notes (Addendum)
Pt went to MRI, RN stopped infusions for MRI. Pt arrived back to unit and sugar has dropped down to 69. Restarted infusions. MD paged. Will continue to monitor.

## 2018-12-09 NOTE — Progress Notes (Signed)
PHARMACY - PHYSICIAN COMMUNICATION CRITICAL VALUE ALERT - BLOOD CULTURE IDENTIFICATION (BCID)  Donald Jenkins is an 48 y.o. male who presented to [facility] on 12/07/2018 with a chief complaint of back pain.  Assessment:  1/4 blood cx bottles growing strep spp, was started on ABX yesterday but d/c'd given no apparent source of infection.  Name of physician (or Provider) Contacted: KSchorr  Current antibiotics: None  Changes to prescribed antibiotics recommended:  No ABX indicated at this time; will start Rocephin if another bottle turns positive.  Results for orders placed or performed during the hospital encounter of 12/07/18  Blood Culture ID Panel (Reflexed) (Collected: 12/08/2018  1:08 AM)  Result Value Ref Range   Enterococcus species NOT DETECTED NOT DETECTED   Listeria monocytogenes NOT DETECTED NOT DETECTED   Staphylococcus species NOT DETECTED NOT DETECTED   Staphylococcus aureus (BCID) NOT DETECTED NOT DETECTED   Streptococcus species DETECTED (A) NOT DETECTED   Streptococcus agalactiae NOT DETECTED NOT DETECTED   Streptococcus pneumoniae NOT DETECTED NOT DETECTED   Streptococcus pyogenes NOT DETECTED NOT DETECTED   Acinetobacter baumannii NOT DETECTED NOT DETECTED   Enterobacteriaceae species NOT DETECTED NOT DETECTED   Enterobacter cloacae complex NOT DETECTED NOT DETECTED   Escherichia coli NOT DETECTED NOT DETECTED   Klebsiella oxytoca NOT DETECTED NOT DETECTED   Klebsiella pneumoniae NOT DETECTED NOT DETECTED   Proteus species NOT DETECTED NOT DETECTED   Serratia marcescens NOT DETECTED NOT DETECTED   Haemophilus influenzae NOT DETECTED NOT DETECTED   Neisseria meningitidis NOT DETECTED NOT DETECTED   Pseudomonas aeruginosa NOT DETECTED NOT DETECTED   Candida albicans NOT DETECTED NOT DETECTED   Candida glabrata NOT DETECTED NOT DETECTED   Candida krusei NOT DETECTED NOT DETECTED   Candida parapsilosis NOT DETECTED NOT DETECTED   Candida tropicalis NOT  DETECTED NOT DETECTED    Wynona Neat, PharmD, BCPS  12/09/2018  1:11 AM

## 2018-12-09 NOTE — Progress Notes (Addendum)
[facility] PROGRESS NOTE  LASALLE ABEE FOY:[id number] DOB: 12/06/1970 DOA: 12/07/2018 PCP: Patient, No Pcp Per  Assessment/Plan: Rhabdomyolysis:  Etiology unclear. Denies injury. Denies over Exercising. However MRI of thoracic spine reveals abnormal soft tissue edema suggesting muscular injury/strain. Also reports a "dietary supplement for exercise". Unsure of name. CK pending this am. Creatinine 2.8 on admission and 1.12 this am. No events on tele. Mag level 2.6 -continue IV fluids but will change to D10 given hypoglycemia -repeat CK in AM -intake and output - avoid NSAIDs and tylenol given elevated AST and ALT  Acute metabolic encephalopathy:resolved. stat CT-head and C spin-->results showedno acute intracranial abnormalities or C-spine fracture. Neuro exam benign.  -continue home meds which incude ritalin and xanax  Metabolic acidosis: Bicarbonate 21, anion gap 8 this am. S/p bicarb supplement.  Volatiles negative. Ethylene glycol level <5. - continue  bicarb -IVF as above -monitor  Essential hypertension, benign: controlled. holding Zestoretic due to AKI -continue  amlodipine 5 mg daily -IV hydralazine prn  Hypersomnolence disorder, with mental disorder, acute, moderate: -continue ritalin  SIRS vs. Sepsis/bacteremia:pt had WBC 23 and tachycardia, meeting criteria for SIRS vs. Sepsis on admission.  Lactic acid normal 1.5. UA negative and CXR negative. 1/4 BC with strep spp.  WBC trending down. Remains afebrile and non-toxic appearing.  procalcitonin 0.37. lactic acid within limits of normal -will resume vancomycine  -IVF as above  Back pain/T8 compression fracture: X-ray showed moderate T8 compression fracture, new since 2015 and likely acute in this clinical setting per report. MRI confirms candidate for vertebroplasty. Postpone due to bacteremia. of note, radiology approved procedure but cannot do until 6/16. May need to be done OP.  -prn oxycodone (can not  NSIADs due to Aki, cannot use Tylenol due to abnormal LFT) -pt/OT  Depression with anxiety:  -continue home Buspar and Xanax  Abnormal LFTs: Likely due to rhabdomyolysis. AST trending down and ALT trending up. Alk phos trending down and total bili within limits of normal. Hep panel negative.   -avoid using liver toxic meds, such as Tylenol -iv fluids -recheck in am  AKI (acute kidney injury) ([place]): likely due to rhabdo. Creatinine within limits of normal. Urine output 2370m last 24 hours.  See above -IVF as above -hold Zestoretic -monitor urine output  Elevated troponin:Troponin 0.05 x2 then 0.06 trending down 0.04. No CP.Likely due to demand ischemia. AKImay have contributed partially for elevated troponin. Repeat ekg with ST at 99. No acute changes - prn Morphine, and aspirin - Risk factor stratification: will check FLP and A1C  Hypoglycemia: etiology unclear. Drinking fluids (diet soda) but not eating much food. CBG range 59-109 last 24 hours. IV fluids changed to d10 -continue d10 at 125 -cbg every 4 hours    Code Status: full Family Communication: patient Disposition Plan: home when ready   Consultants:    Procedures:    Antibiotics:  vanc 6/10-6/11  Cefepime 6/10-6/11  vanc 6/12>>  HPI/Subjective: 48yo hx of depression/anxiety, panic attack, hypersomnolence disorder admitted with rhabdomyolysis, aki, metabolic acidosis, elevated lft T8 compression fracture and leukocytosis. 1/4 BC positive and developed hypoglycemia.   Objective: Vitals:   12/09/18 0101 12/09/18 0443  BP: (!) 123/93 114/74  Pulse: 83 74  Resp: 20 20  Temp: 98.1 F (36.7 C) 98.5 F (36.9 C)  SpO2: 100% 99%    Intake/Output Summary (Last 24 hours) at 12/09/2018 0843 Last data filed at 12/09/2018 07672Gross per 24 hour  Intake 2640 ml  Output 3450 ml  Net -810 ml   Filed Weights   12/07/18 1737 12/08/18 0010 12/09/18 0359  Weight: 77.1 kg 78.7 kg 83 kg     Exam:   General:  Awake alert no acute distress. Color somewhat pale with yellow tint  Cardiovascular: rrr no mgr no LE edema  Respiratory: normal effort BS clear bilaterally no wheeze  Abdomen: non-distended non-tender +BS no guarding or rebounding  Musculoskeletal: back pain with position changes and deep breath, no so tender to palpation. Joints without swelling/erythema   Data Reviewed: Basic Metabolic Panel: Recent Labs  Lab 12/07/18 1813 12/08/18 0349 12/09/18 0656  NA 138 136 137  K 4.3 4.1 3.7  CL 101 108 108  CO2 19* 14* 21*  GLUCOSE 113* 90 109*  BUN 55* 47* 21*  CREATININE 2.80* 2.04* 1.12  CALCIUM 9.4 8.1* 8.4*  MG  --  2.6*  --    Liver Function Tests: Recent Labs  Lab 12/07/18 1813 12/08/18 0349 12/09/18 0656  AST 684* 775* 629*  ALT 140* 161* 206*  ALKPHOS 68 54 37*  BILITOT 1.2 1.0 0.8  PROT 8.2* 6.5 4.9*  ALBUMIN 4.7 3.6 2.7*   No results for input(s): LIPASE, AMYLASE in the last 168 hours. No results for input(s): AMMONIA in the last 168 hours. CBC: Recent Labs  Lab 12/07/18 1813 12/08/18 0349 12/09/18 0656  WBC 23.5* 16.0* 7.2  NEUTROABS 20.8*  --   --   HGB 17.1* 13.1 11.8*  HCT 48.7 38.1* 33.2*  MCV 89.0 91.6 88.1  PLT 293 214 188   Cardiac Enzymes: Recent Labs  Lab 12/07/18 1813 12/08/18 0108 12/08/18 0349 12/08/18 1142  CKTOTAL >50,000*  --  >50,000*  --   TROPONINI 0.05* 0.05* 0.06* 0.04*   BNP (last 3 results) No results for input(s): BNP in the last 8760 hours.  ProBNP (last 3 results) No results for input(s): PROBNP in the last 8760 hours.  CBG: Recent Labs  Lab 12/09/18 0352 12/09/18 0418 12/09/18 0522 12/09/18 0557 12/09/18 0651  GLUCAP 69* 86 99 98 97    Recent Results (from the past 240 hour(s))  SARS Coronavirus 2 (CEPHEID - Performed in [facility] hospital lab), Hosp Order     Status: None   Collection Time: 12/07/18 10:36 PM   Specimen: Nasopharyngeal Swab  Result Value Ref Range Status    SARS Coronavirus 2 NEGATIVE NEGATIVE Final    Comment: (NOTE) If result is NEGATIVE SARS-CoV-2 target nucleic acids are NOT DETECTED. The SARS-CoV-2 RNA is generally detectable in upper and lower  respiratory specimens during the acute phase of infection. The lowest  concentration of SARS-CoV-2 viral copies this assay can detect is 250  copies / mL. A negative result does not preclude SARS-CoV-2 infection  and should not be used as the sole basis for treatment or other  patient management decisions.  A negative result may occur with  improper specimen collection / handling, submission of specimen other  than nasopharyngeal swab, presence of viral mutation(s) within the  areas targeted by this assay, and inadequate number of viral copies  (<250 copies / mL). A negative result must be combined with clinical  observations, patient history, and epidemiological information. If result is POSITIVE SARS-CoV-2 target nucleic acids are DETECTED. The SARS-CoV-2 RNA is generally detectable in upper and lower  respiratory specimens dur ing the acute phase of infection.  Positive  results are indicative of active infection with SARS-CoV-2.  Clinical  correlation with patient history and other diagnostic information is  necessary to determine patient infection status.  Positive results do  not rule out bacterial infection or co-infection with other viruses. If result is PRESUMPTIVE POSTIVE SARS-CoV-2 nucleic acids MAY BE PRESENT.   A presumptive positive result was obtained on the submitted specimen  and confirmed on repeat testing.  While 2019 novel coronavirus  (SARS-CoV-2) nucleic acids may be present in the submitted sample  additional confirmatory testing may be necessary for epidemiological  and / or clinical management purposes  to differentiate between  SARS-CoV-2 and other Sarbecovirus currently known to infect humans.  If clinically indicated additional testing with an alternate test   methodology [phone]) is advised. The SARS-CoV-2 RNA is generally  detectable in upper and lower respiratory sp ecimens during the acute  phase of infection. The expected result is Negative. Fact Sheet for Patients:  StrictlyIdeas.no Fact Sheet for Healthcare Providers: BankingDealers.co.za This test is not yet approved or cleared by the [place] FDA and has been authorized for detection and/or diagnosis of SARS-CoV-2 by FDA under an Emergency Use Authorization (EUA).  This EUA will remain in effect (meaning this test can be used) for the duration of the COVID-19 declaration under Section 564(b)(1) of the Act, 21 U.S.C. section 360bbb-3(b)(1), unless the authorization is terminated or revoked sooner. Performed at [facility], [place] [address], [place], [place] [address]   Culture, blood (x 2)     Status: None (Preliminary result)   Collection Time: 12/08/18  1:08 AM   Specimen: BLOOD  Result Value Ref Range Status   Specimen Description BLOOD RIGHT ARM  Final   Special Requests   Final    BOTTLES DRAWN AEROBIC AND ANAEROBIC Blood Culture adequate volume   Culture  Setup Time   Final    GRAM POSITIVE COCCI IN CHAINS AEROBIC BOTTLE ONLY Organism ID to follow CRITICAL RESULT CALLED TO, READ BACK BY AND VERIFIED WITH: PHRMD V BRYK '@[id number]'  12/09/18 BY S GEZAHEGN Performed at [facility], [place] [address], [place], [place] [address]    Culture PENDING  Incomplete   Report Status PENDING  Incomplete  Blood Culture ID Panel (Reflexed)     Status: Abnormal   Collection Time: 12/08/18  1:08 AM  Result Value Ref Range Status   Enterococcus species NOT DETECTED NOT DETECTED Final   Listeria monocytogenes NOT DETECTED NOT DETECTED Final   Staphylococcus species NOT DETECTED NOT DETECTED Final   Staphylococcus aureus (BCID) NOT DETECTED NOT DETECTED Final   Streptococcus species DETECTED (A) NOT DETECTED Final    Comment: Not  Enterococcus species, Streptococcus agalactiae, Streptococcus pyogenes, or Streptococcus pneumoniae. CRITICAL RESULT CALLED TO, READ BACK BY AND VERIFIED WITH: PHRMD V BRYK '@0052'  12/09/18 BY S GEZAHEGN    Streptococcus agalactiae NOT DETECTED NOT DETECTED Final   Streptococcus pneumoniae NOT DETECTED NOT DETECTED Final   Streptococcus pyogenes NOT DETECTED NOT DETECTED Final   Acinetobacter baumannii NOT DETECTED NOT DETECTED Final   Enterobacteriaceae species NOT DETECTED NOT DETECTED Final   Enterobacter cloacae complex NOT DETECTED NOT DETECTED Final   Escherichia coli NOT DETECTED NOT DETECTED Final   Klebsiella oxytoca NOT DETECTED NOT DETECTED Final   Klebsiella pneumoniae NOT DETECTED NOT DETECTED Final   Proteus species NOT DETECTED NOT DETECTED Final   Serratia marcescens NOT DETECTED NOT DETECTED Final   Haemophilus influenzae NOT DETECTED NOT DETECTED Final   Neisseria meningitidis NOT DETECTED NOT DETECTED Final   Pseudomonas aeruginosa NOT DETECTED NOT DETECTED Final   Candida albicans NOT DETECTED  NOT DETECTED Final   Candida glabrata NOT DETECTED NOT DETECTED Final   Candida krusei NOT DETECTED NOT DETECTED Final   Candida parapsilosis NOT DETECTED NOT DETECTED Final   Candida tropicalis NOT DETECTED NOT DETECTED Final    Comment: Performed at [facility], [place] [address], [place], [place] [address]     Studies: Dg Thoracic Spine 2 View  Result Date: 12/08/2018 CLINICAL DATA:  48 year old male status post fall.  Pain. EXAM: THORACIC SPINE 2 VIEWS COMPARISON:  Cervical spine CT today reported separately. Chest radiographs 07/02/2013. FINDINGS: Normal thoracic segmentation. There is a moderate compression fracture of T8 which is new since 2015. At other levels thoracic vertebral height and alignment appears preserved. Cervicothoracic junction alignment is within normal limits. Preserved disc spaces. Posterior ribs appear intact. Negative visible chest and abdominal  visceral contours. IMPRESSION: Moderate T8 compression fracture, new since 2015 and likely acute in this clinical setting. If specific therapy is desired, Thoracic MRI without contrast or Nuclear Medicine Whole-body Bone Scan would confirm candidacy for vertebroplasty. Electronically Signed   By: Genevie Ann M.D.   On: 12/08/2018 02:10   Ct Head Wo Contrast  Result Date: 12/08/2018 CLINICAL DATA:  48 year old male status post fall. Altered mental status. EXAM: CT HEAD WITHOUT CONTRAST CT CERVICAL SPINE WITHOUT CONTRAST TECHNIQUE: Multidetector CT imaging of the head and cervical spine was performed following the standard protocol without intravenous contrast. Multiplanar CT image reconstructions of the cervical spine were also generated. COMPARISON:  None. FINDINGS: CT HEAD FINDINGS Brain: No midline shift, ventriculomegaly, mass effect, evidence of mass lesion, intracranial hemorrhage or evidence of cortically based acute infarction. Gray-white matter differentiation is within normal limits throughout the brain. Vascular: Mild Calcified atherosclerosis at the skull base. No suspicious intracranial vascular hyperdensity. Skull: Negative. Sinuses/Orbits: Visualized paranasal sinuses and mastoids are clear. Other: Visualized orbits and scalp soft tissues are within normal limits. CT CERVICAL SPINE FINDINGS Alignment: Relatively preserved cervical lordosis. Levoconvex cervical spine curvature or scoliosis. Cervicothoracic junction alignment is within normal limits. Bilateral posterior element alignment is within normal limits. Skull base and vertebrae: Visualized skull base is intact. No atlanto-occipital dissociation. Motion artifact at C4 and C5, with repeat imaging through those levels. No cervical spine fracture identified. Benign appearing sclerosis of the left C4 facet. Normal bone mineralization otherwise. Soft tissues and spinal canal: No prevertebral fluid or swelling. No visible canal hematoma. Calcified  carotid atherosclerosis greater on the left. Disc levels: Disc space loss with endplate spurring at G2-B6 and C6-C7. Upper chest: Negative visible upper thoracic levels and lung apices. IMPRESSION: 1. Normal noncontrast CT appearance of the brain. 2. No acute traumatic injury identified in the head or cervical spine. Electronically Signed   By: Genevie Ann M.D.   On: 12/08/2018 02:07   Ct Cervical Spine Wo Contrast  Result Date: 12/08/2018 CLINICAL DATA:  48 year old male status post fall. Altered mental status. EXAM: CT HEAD WITHOUT CONTRAST CT CERVICAL SPINE WITHOUT CONTRAST TECHNIQUE: Multidetector CT imaging of the head and cervical spine was performed following the standard protocol without intravenous contrast. Multiplanar CT image reconstructions of the cervical spine were also generated. COMPARISON:  None. FINDINGS: CT HEAD FINDINGS Brain: No midline shift, ventriculomegaly, mass effect, evidence of mass lesion, intracranial hemorrhage or evidence of cortically based acute infarction. Gray-white matter differentiation is within normal limits throughout the brain. Vascular: Mild Calcified atherosclerosis at the skull base. No suspicious intracranial vascular hyperdensity. Skull: Negative. Sinuses/Orbits: Visualized paranasal sinuses and mastoids are clear. Other: Visualized  orbits and scalp soft tissues are within normal limits. CT CERVICAL SPINE FINDINGS Alignment: Relatively preserved cervical lordosis. Levoconvex cervical spine curvature or scoliosis. Cervicothoracic junction alignment is within normal limits. Bilateral posterior element alignment is within normal limits. Skull base and vertebrae: Visualized skull base is intact. No atlanto-occipital dissociation. Motion artifact at C4 and C5, with repeat imaging through those levels. No cervical spine fracture identified. Benign appearing sclerosis of the left C4 facet. Normal bone mineralization otherwise. Soft tissues and spinal canal: No prevertebral  fluid or swelling. No visible canal hematoma. Calcified carotid atherosclerosis greater on the left. Disc levels: Disc space loss with endplate spurring at K9-F8 and C6-C7. Upper chest: Negative visible upper thoracic levels and lung apices. IMPRESSION: 1. Normal noncontrast CT appearance of the brain. 2. No acute traumatic injury identified in the head or cervical spine. Electronically Signed   By: Genevie Ann M.D.   On: 12/08/2018 02:07   Mr Thoracic Spine Wo Contrast  Result Date: 12/09/2018 CLINICAL DATA:  Initial evaluation for acute mid to upper back pain no known injury. EXAM: MRI THORACIC SPINE WITHOUT CONTRAST TECHNIQUE: Multiplanar, multisequence MR imaging of the thoracic spine was performed. No intravenous contrast was administered. COMPARISON:  Prior radiograph from 12/08/2018. FINDINGS: Alignment: Mild dextroscoliosis. Alignment otherwise normal with preservation of the normal thoracic kyphosis. No listhesis or malalignment. Vertebrae: Acute to subacute appearing compression fracture of the T8 vertebral body with up to 50% central height loss with trace 2 mm bony retropulsion at the inferior posterior cortex. This is benign/mechanical in appearance. Mild height loss at the superior endplates of T4, T5, and T7 with associated Schmorl's node deformities largely chronic in appearance. No other acute or subacute compression fracture. Vertebral body height otherwise maintained. Underlying bone marrow signal intensity within normal limits. Few scattered benign hemangiomas noted, most prominent of which measures 11 mm in the T10 vertebral body. No aggressive or worrisome osseous lesions. Reactive marrow edema present about the right T4-5 facet due to facet arthritis (series 17, image 6). No other abnormal marrow edema. Cord: Signal intensity within the thoracic spinal cord is normal. Normal cord caliber and morphology. Conus terminates inferior to T12. Paraspinal and other soft tissues: Soft tissue edema  present within the upper posterior paraspinous musculature (series 17, images 1, 19), suggesting muscular injury/strain. Paraspinous soft tissues demonstrate no other acute finding. Visualized lungs are largely clear. Visualized visceral structures unremarkable. Disc levels: T1-2:  Unremarkable. T2-3: Unremarkable. T3-4: Mild height loss with endplate Schmorl's node at the superior endplate of T4. Right-sided facet degeneration. Resultant mild to moderate right foraminal narrowing. No spinal stenosis or left foraminal narrowing. T4-5: Height loss at the superior endplate of T5 with associated Schmorl's node deformity. Right greater than left facet degeneration. Mild right foraminal narrowing. No canal or left foraminal stenosis. T5-6: Mild right-sided facet degeneration. No significant stenosis. T6-7:  Minimal disc bulge.  No stenosis. T7-8:  Minimal disc bulge.  No stenosis. T8-9: Trace 2 mm bony retropulsion related to the T8 compression fracture. No significant stenosis or cord impingement. Mild left foraminal narrowing. T9-10: Mild facet hypertrophy.  No stenosis. T10-11:  Mild facet hypertrophy.  No stenosis. T11-12:  Unremarkable. T12-L1:  Unremarkable. IMPRESSION: 1. Acute to early subacute compression fracture of T8 with up to 50% height loss and trace 2 mm bony retropulsion. No significant spinal stenosis or cord impingement. Finding would be amendable to vertebral augmentation. 2. Abnormal soft tissue edema involving the upper posterior paraspinous musculature bilaterally (rhomboid major musculature), suggesting  muscular injury and/or strain. 3. Reactive marrow edema about the right T4-5 facet, compatible with active facet arthritis. Finding could contribute to underlying back pain. 4. Mild multilevel thoracic degenerative spondylitic changes without significant spinal stenosis. Mild to moderate right foraminal narrowing at T3-4 and T4-5, with mild left foraminal narrowing at T8-9. Electronically Signed    By: Jeannine Boga M.D.   On: 12/09/2018 03:53   Dg Chest Portable 1 View  Result Date: 12/07/2018 CLINICAL DATA:  Back pain EXAM: PORTABLE CHEST 1 VIEW COMPARISON:  Chest x-ray dated 02/12/2014. FINDINGS: The heart size and mediastinal contours are within normal limits. Both lungs are clear. The visualized skeletal structures are unremarkable. There are probable old healed anterior rib fractures on the right. IMPRESSION: No active disease. Electronically Signed   By: Constance Holster M.D.   On: 12/07/2018 22:05    Scheduled Meds: . ALPRAZolam  1 mg Oral BID  . amLODipine  5 mg Oral Daily  . aspirin EC  81 mg Oral Daily  . busPIRone  15 mg Oral BID  . heparin injection (subcutaneous)  5,000 Units Subcutaneous Q8H  . methylphenidate  5 mg Oral TID WC  . sodium bicarbonate  650 mg Oral TID   Continuous Infusions: . sodium chloride 250 mL (12/08/18 0300)  . sodium chloride 150 mL/hr at 12/08/18 2142  . dextrose 125 mL/hr at 12/09/18 0509  . sodium chloride 999 mL/hr at 12/08/18 8546    Principal Problem:   Rhabdomyolysis Active Problems:   Essential hypertension, benign   SIRS (systemic inflammatory response syndrome) (H[facility])   AKI (acute kidney injury) ([place])   Acute metabolic encephalopathy   Metabolic acidosis   Compression fracture of body of thoracic vertebra (HCC)   Hypoglycemia   Bacteremia   Hypersomnolence disorder, with mental disorder, acute, moderate   Back pain   Depression with anxiety   Abnormal LFTs   Elevated troponin    Time spent: 45 minutes    [place] NP  [facility]  If 7PM-7AM, please contact night-coverage at [email or web address], password [facility] 12/09/2018, 8:43 AM  LOS: 1 day

## 2018-12-09 NOTE — Progress Notes (Signed)
IR requested by Dr. Erlinda Hong for possible image-guided T8 KP/VP.  Images reviewed by Dr. Estanislado Pandy who approves procedure. However, the earliest procedure can occur (due to scheduling) is Tuesday 12/13/2018. If patient discharged prior to Tuesday, procedure can be scheduled as an outpatient. Dr. Erlinda Hong made aware of above.  IR to follow next week to see if patient still admitted. Please call IR with questions/concerns.   Bea Graff Zenith Kercheval, PA-C 12/09/2018, 12:21 PM

## 2018-12-10 DIAGNOSIS — W19XXXA Unspecified fall, initial encounter: Secondary | ICD-10-CM

## 2018-12-10 DIAGNOSIS — G9341 Metabolic encephalopathy: Secondary | ICD-10-CM

## 2018-12-10 LAB — COMPREHENSIVE METABOLIC PANEL
ALT: 234 U/L — ABNORMAL HIGH (ref 0–44)
AST: 595 U/L — ABNORMAL HIGH (ref 15–41)
Albumin: 3 g/dL — ABNORMAL LOW (ref 3.5–5.0)
Alkaline Phosphatase: 41 U/L (ref 38–126)
Anion gap: 11 (ref 5–15)
BUN: 7 mg/dL (ref 6–20)
CO2: 26 mmol/L (ref 22–32)
Calcium: 8.9 mg/dL (ref 8.9–10.3)
Chloride: 104 mmol/L (ref 98–111)
Creatinine, Ser: 0.97 mg/dL (ref 0.61–1.24)
GFR calc Af Amer: >60 mL/min (ref >60)
GFR calc non Af Amer: >60 mL/min (ref >60)
Glucose, Bld: 95 mg/dL (ref 70–99)
Potassium: 3.2 mmol/L — ABNORMAL LOW (ref 3.5–5.1)
Sodium: 141 mmol/L (ref 135–145)
Total Bilirubin: 0.5 mg/dL (ref 0.3–1.2)
Total Protein: 5.6 g/dL — ABNORMAL LOW (ref 6.5–8.1)

## 2018-12-10 LAB — GLUCOSE, CAPILLARY
Glucose-Capillary: 103 mg/dL — ABNORMAL HIGH (ref 70–99)
Glucose-Capillary: 110 mg/dL — ABNORMAL HIGH (ref 70–99)
Glucose-Capillary: 121 mg/dL — ABNORMAL HIGH (ref 70–99)
Glucose-Capillary: 93 mg/dL (ref 70–99)
Glucose-Capillary: 95 mg/dL (ref 70–99)
Glucose-Capillary: 95 mg/dL (ref 70–99)

## 2018-12-10 LAB — CBC
HCT: 35.6 % — ABNORMAL LOW (ref 39.0–52.0)
Hemoglobin: 12.6 g/dL — ABNORMAL LOW (ref 13.0–17.0)
MCH: 31.3 pg (ref 26.0–34.0)
MCHC: 35.4 g/dL (ref 30.0–36.0)
MCV: 88.6 fL (ref 80.0–100.0)
Platelets: 190 10*3/uL (ref 150–400)
RBC: 4.02 MIL/uL — ABNORMAL LOW (ref 4.22–5.81)
RDW: 11.9 % (ref 11.5–15.5)
WBC: 6.2 10*3/uL (ref 4.0–10.5)
nRBC: 0 % (ref 0.0–0.2)

## 2018-12-10 LAB — CK: Total CK: 32170 U/L — ABNORMAL HIGH (ref 49–397)

## 2018-12-10 MED ORDER — MORPHINE SULFATE (PF) 2 MG/ML IV SOLN
1.0000 mg | INTRAVENOUS | Status: DC | PRN
  Administered 2018-12-10 – 2018-12-13 (×9): 1 mg via INTRAVENOUS
  Filled 2018-12-10 (×9): qty 1

## 2018-12-10 MED ORDER — POTASSIUM CHLORIDE CRYS ER 20 MEQ PO TBCR
40.0000 meq | EXTENDED_RELEASE_TABLET | Freq: Once | ORAL | Status: AC
  Administered 2018-12-10: 40 meq via ORAL
  Filled 2018-12-10: qty 2

## 2018-12-10 MED ORDER — ALPRAZOLAM 0.5 MG PO TABS
1.0000 mg | ORAL_TABLET | Freq: Two times a day (BID) | ORAL | Status: DC | PRN
  Administered 2018-12-11 – 2018-12-15 (×8): 1 mg via ORAL
  Filled 2018-12-10 (×8): qty 2

## 2018-12-10 NOTE — Progress Notes (Signed)
Patient asking to talk with provider. Paged MD and let her know.

## 2018-12-10 NOTE — Progress Notes (Signed)
PROGRESS NOTE  Donald Jenkins KWI:[id number] DOB: Sep 22, 1970 DOA: 12/07/2018 PCP: Patient, No Pcp Per  HPI/Recap of past 24 hours:  Still c/o back pain, requiring frequent iv morphine for pain control  He appear less confused today, though still have poor memory, he is able to  remember went to bed around 7pm on Teusday, he reports remember sustained a hard fall on his back at home  He denies iv drug use  Assessment/Plan: Principal Problem:   Rhabdomyolysis Active Problems:   Essential hypertension, benign   Hypersomnolence disorder, with mental disorder, acute, moderate   SIRS (systemic inflammatory response syndrome) (HCC)   Back pain   Depression with anxiety   Abnormal LFTs   AKI (acute kidney injury) ([place])   Elevated troponin   Acute metabolic encephalopathy   Metabolic acidosis   Compression fracture of body of thoracic vertebra (HCC)   Hypoglycemia   Bacteremia   Rhabdomyolysis -suspect due to prolonged immobilization  -improving on ivf, continue ivf  Elevated ast/alt Likely from rhabdomyolysis Hepatitis panel negative    SIRS/sepsis/strep bacteremia -he received vanc/cefepime initially, currently on rocephin -wbc normalized, will repeat blood culture tomorrow  Hypoglycemia: Had hypoglycemia event on 6/11 evening He received d10 infusion for 10hrs, currently on lr/d5 Blood glucose stable, continue LR/d5  Insulin level, c-peptide and sulfonylurea level in process  Acute metabolic encephalopathy  -CT-head and C spin-->results showedno acute intracranial abnormalities or C-spine fracture. -likely multifactorial  -he does has leukocytosis, bacteremia, aki, polypharmacy? -he reports has not taking any xanax for the last two weeks, he reports he is started on ritalin a month ago which he think Is helping -he has been on buspirone for a long time -he follows his psychiatrist closely  -appear improving, still have poor memory   AKI: Likely from  rhabdo Cr 2.8 on presentation, cr 2.42 today  Metabolic acidosis: Bicarbonate 21, anion gap 8 this am. S/p bicarb supplement.  Volatiles negative. Ethylene glycol level <5. - continue  bicarb, improving -IVF as above  Back pain/acute T8 compression fracture -likely from fall at home, he reports "fell from his couch and landed hard on the floor on his back" -still requiring prn iv morphine for pain control -IR consulted for image-guided T8 KP/VP on 6/16  HTN; Home meds lisinopril/HCTZ held since admission bp stable without bp meds, continue monitor  Troponin mild and flat at 0.05- 0.06-0.04 Denies chest pain Tele unremarkable  will d/c tele   Psych: Continue buspirone, ritalin Change xanax to prn, he reports has not been taking any xanax for the last two weeks   COVID19 screening negative  Code Status: full  Family Communication: patient   Disposition Plan: not ready to discharge, still requiring frequent iv morphine for pain control, ck remain significantly elevated   Consultants:  IR  Procedures:  Plan for  image-guided T8 KP/VP on 6/16  Antibiotics:  Vanc/cefepime x1 on admission  Rocephin    Objective: BP 124/82 (BP Location: Left Arm)   Pulse 74   Temp 98.3 F (36.8 C) (Oral)   Resp 18   Ht 6\' 1"  (1.854 m)   Wt 82.8 kg   SpO2 100%   BMI 24.08 kg/m   Intake/Output Summary (Last 24 hours) at 12/10/2018 1722 Last data filed at 12/10/2018 1531 Gross per 24 hour  Intake 4075.1 ml  Output 4550 ml  Net -474.9 ml   Filed Weights   12/08/18 0010 12/09/18 0359 12/10/18 0432  Weight: 78.7 kg 83 kg 82.8 kg  Exam: Patient is examined daily including today on 12/10/2018, exams remain the same as of yesterday except that has changed    General:  NAD, poor memory   Cardiovascular: RRR  Respiratory: CTABL  Abdomen: Soft/ND/NT, positive BS  Musculoskeletal: No Edema  Neuro: alert, oriented   Data Reviewed: Basic Metabolic Panel: Recent  Labs  Lab 12/07/18 1813 12/08/18 0349 12/09/18 0656 12/10/18 0452  NA 138 136 137 141  K 4.3 4.1 3.7 3.2*  CL 101 108 108 104  CO2 19* 14* 21* 26  GLUCOSE 113* 90 109* 95  BUN 55* 47* 21* 7  CREATININE 2.80* 2.04* 1.12 0.97  CALCIUM 9.4 8.1* 8.4* 8.9  MG  --  2.6*  --   --    Liver Function Tests: Recent Labs  Lab 12/07/18 1813 12/08/18 0349 12/09/18 0656 12/10/18 0452  AST 684* 775* 629* 595*  ALT 140* 161* 206* 234*  ALKPHOS 68 54 37* 41  BILITOT 1.2 1.0 0.8 0.5  PROT 8.2* 6.5 4.9* 5.6*  ALBUMIN 4.7 3.6 2.7* 3.0*   No results for input(s): LIPASE, AMYLASE in the last 168 hours. No results for input(s): AMMONIA in the last 168 hours. CBC: Recent Labs  Lab 12/07/18 1813 12/08/18 0349 12/09/18 0656 12/10/18 0452  WBC 23.5* 16.0* 7.2 6.2  NEUTROABS 20.8*  --   --   --   HGB 17.1* 13.1 11.8* 12.6*  HCT 48.7 38.1* 33.2* 35.6*  MCV 89.0 91.6 88.1 88.6  PLT 293 214 188 190   Cardiac Enzymes:   Recent Labs  Lab 12/07/18 1813 12/08/18 0108 12/08/18 0349 12/08/18 1142 12/09/18 0656 12/10/18 0452  CKTOTAL >50,000*  --  >50,000*  --  45,248* 32,170*  TROPONINI 0.05* 0.05* 0.06* 0.04*  --   --    BNP (last 3 results) No results for input(s): BNP in the last 8760 hours.  ProBNP (last 3 results) No results for input(s): PROBNP in the last 8760 hours.  CBG: Recent Labs  Lab 12/10/18 0026 12/10/18 0422 12/10/18 0807 12/10/18 1238 12/10/18 1631  GLUCAP 95 95 121* 110* 103*    Recent Results (from the past 240 hour(s))  SARS Coronavirus 2 (CEPHEID - Performed in [place] hospital lab), Hosp Order     Status: None   Collection Time: 12/07/18 10:36 PM   Specimen: Nasopharyngeal Swab  Result Value Ref Range Status   SARS Coronavirus 2 NEGATIVE NEGATIVE Final    Comment: (NOTE) If result is NEGATIVE SARS-CoV-2 target nucleic acids are NOT DETECTED. The SARS-CoV-2 RNA is generally detectable in upper and lower  respiratory specimens during the acute  phase of infection. The lowest  concentration of SARS-CoV-2 viral copies this assay can detect is 250  copies / mL. A negative result does not preclude SARS-CoV-2 infection  and should not be used as the sole basis for treatment or other  patient management decisions.  A negative result may occur with  improper specimen collection / handling, submission of specimen other  than nasopharyngeal swab, presence of viral mutation(s) within the  areas targeted by this assay, and inadequate number of viral copies  (<250 copies / mL). A negative result must be combined with clinical  observations, patient history, and epidemiological information. If result is POSITIVE SARS-CoV-2 target nucleic acids are DETECTED. The SARS-CoV-2 RNA is generally detectable in upper and lower  respiratory specimens dur ing the acute phase of infection.  Positive  results are indicative of active infection with SARS-CoV-2.  Clinical  correlation with  patient history and other diagnostic information is  necessary to determine patient infection status.  Positive results do  not rule out bacterial infection or co-infection with other viruses. If result is PRESUMPTIVE POSTIVE SARS-CoV-2 nucleic acids MAY BE PRESENT.   A presumptive positive result was obtained on the submitted specimen  and confirmed on repeat testing.  While 2019 novel coronavirus  (SARS-CoV-2) nucleic acids may be present in the submitted sample  additional confirmatory testing may be necessary for epidemiological  and / or clinical management purposes  to differentiate between  SARS-CoV-2 and other Sarbecovirus currently known to infect humans.  If clinically indicated additional testing with an alternate test  methodology [phone]) is advised. The SARS-CoV-2 RNA is generally  detectable in upper and lower respiratory sp ecimens during the acute  phase of infection. The expected result is Negative. Fact Sheet for Patients:   StrictlyIdeas.no Fact Sheet for Healthcare Providers: BankingDealers.co.za This test is not yet approved or cleared by the [place] FDA and has been authorized for detection and/or diagnosis of SARS-CoV-2 by FDA under an Emergency Use Authorization (EUA).  This EUA will remain in effect (meaning this test can be used) for the duration of the COVID-19 declaration under Section 564(b)(1) of the Act, 21 U.S.C. section 360bbb-3(b)(1), unless the authorization is terminated or revoked sooner. Performed at [facility], [place] [address] [place], [place] [address]   Urine Culture     Status: None   Collection Time: 12/08/18 12:52 AM   Specimen: Urine, Random  Result Value Ref Range Status   Specimen Description URINE, RANDOM  Final   Special Requests NONE  Final   Culture   Final    NO GROWTH Performed at [facility], [place] [address], [place], [place] [address]    Report Status 12/09/2018 FINAL  Final  Culture, blood (x 2)     Status: None (Preliminary result)   Collection Time: 12/08/18  1:08 AM   Specimen: BLOOD  Result Value Ref Range Status   Specimen Description BLOOD LEFT ARM  Final   Special Requests   Final    BOTTLES DRAWN AEROBIC ONLY Blood Culture results may not be optimal due to an inadequate volume of blood received in culture bottles   Culture   Final    NO GROWTH 2 DAYS Performed at [facility], [place] [address] [place], [place] [address]    Report Status PENDING  Incomplete  Culture, blood (x 2)     Status: Abnormal (Preliminary result)   Collection Time: 12/08/18  1:08 AM   Specimen: BLOOD  Result Value Ref Range Status   Specimen Description BLOOD RIGHT ARM  Final   Special Requests   Final    BOTTLES DRAWN AEROBIC AND ANAEROBIC Blood Culture adequate volume   Culture  Setup Time   Final    GRAM POSITIVE COCCI IN CHAINS AEROBIC BOTTLE ONLY Organism ID to follow CRITICAL RESULT CALLED TO,  READ BACK BY AND VERIFIED WITH: PHRMD V BRYK @0052  12/09/18 BY S GEZAHEGN    Culture (A)  Final    VIRIDANS STREPTOCOCCUS THE SIGNIFICANCE OF ISOLATING THIS ORGANISM FROM A SINGLE SET OF BLOOD CULTURES WHEN MULTIPLE SETS ARE DRAWN IS UNCERTAIN. PLEASE NOTIFY THE MICROBIOLOGY DEPARTMENT WITHIN ONE WEEK IF SPECIATION AND SENSITIVITIES ARE REQUIRED. Performed at [facility], [place] [address] [place], [place] [address]    Report Status PENDING  Incomplete  Blood Culture ID Panel (Reflexed)  Status: Abnormal   Collection Time: 12/08/18  1:08 AM  Result Value Ref Range Status   Enterococcus species NOT DETECTED NOT DETECTED Final   Listeria monocytogenes NOT DETECTED NOT DETECTED Final   Staphylococcus species NOT DETECTED NOT DETECTED Final   Staphylococcus aureus (BCID) NOT DETECTED NOT DETECTED Final   Streptococcus species DETECTED (A) NOT DETECTED Final    Comment: Not Enterococcus species, Streptococcus agalactiae, Streptococcus pyogenes, or Streptococcus pneumoniae. CRITICAL RESULT CALLED TO, READ BACK BY AND VERIFIED WITH: PHRMD V BRYK @0052  12/09/18 BY S GEZAHEGN    Streptococcus agalactiae NOT DETECTED NOT DETECTED Final   Streptococcus pneumoniae NOT DETECTED NOT DETECTED Final   Streptococcus pyogenes NOT DETECTED NOT DETECTED Final   Acinetobacter baumannii NOT DETECTED NOT DETECTED Final   Enterobacteriaceae species NOT DETECTED NOT DETECTED Final   Enterobacter cloacae complex NOT DETECTED NOT DETECTED Final   Escherichia coli NOT DETECTED NOT DETECTED Final   Klebsiella oxytoca NOT DETECTED NOT DETECTED Final   Klebsiella pneumoniae NOT DETECTED NOT DETECTED Final   Proteus species NOT DETECTED NOT DETECTED Final   Serratia marcescens NOT DETECTED NOT DETECTED Final   Haemophilus influenzae NOT DETECTED NOT DETECTED Final   Neisseria meningitidis NOT DETECTED NOT DETECTED Final   Pseudomonas aeruginosa NOT DETECTED NOT DETECTED Final   Candida albicans NOT DETECTED  NOT DETECTED Final   Candida glabrata NOT DETECTED NOT DETECTED Final   Candida krusei NOT DETECTED NOT DETECTED Final   Candida parapsilosis NOT DETECTED NOT DETECTED Final   Candida tropicalis NOT DETECTED NOT DETECTED Final    Comment: Performed at [facility], [place] [address] [place], [place] [address]     Studies: No results found.  Scheduled Meds: . aspirin EC  81 mg Oral Daily  . busPIRone  15 mg Oral BID  . heparin injection (subcutaneous)  5,000 Units Subcutaneous Q8H  . methylphenidate  5 mg Oral TID WC  . potassium chloride  40 mEq Oral Once  . sodium bicarbonate  650 mg Oral TID    Continuous Infusions: . sodium chloride 1,000 mL (12/10/18 0950)  . cefTRIAXone (ROCEPHIN)  IV 2 g (12/10/18 0950)  . dextrose 5% lactated ringers 125 mL/hr at 12/10/18 1108  . sodium chloride 999 mL/hr at 12/08/18 0849     Time spent: 78mins I have personally reviewed and interpreted on  12/10/2018 daily labs, tele strips, imagings as discussed above under date review session and assessment and plans.  I reviewed all nursing notes, pharmacy notes, consultant notes,  vitals, pertinent old records  I have discussed plan of care as described above with RN , patient on 12/10/2018   Florencia Reasons MD, PhD  [facility] Pager [phone]. If 7PM-7AM, please contact night-coverage at [email or web address], password [facility] 12/10/2018, 5:22 PM  LOS: 2 days

## 2018-12-11 LAB — COMPREHENSIVE METABOLIC PANEL
ALT: 201 U/L — ABNORMAL HIGH (ref 0–44)
AST: 421 U/L — ABNORMAL HIGH (ref 15–41)
Albumin: 2.7 g/dL — ABNORMAL LOW (ref 3.5–5.0)
Alkaline Phosphatase: 44 U/L (ref 38–126)
Anion gap: 8 (ref 5–15)
BUN: 5 mg/dL — ABNORMAL LOW (ref 6–20)
CO2: 29 mmol/L (ref 22–32)
Calcium: 8.7 mg/dL — ABNORMAL LOW (ref 8.9–10.3)
Chloride: 103 mmol/L (ref 98–111)
Creatinine, Ser: 0.81 mg/dL (ref 0.61–1.24)
GFR calc Af Amer: >60 mL/min (ref >60)
GFR calc non Af Amer: >60 mL/min (ref >60)
Glucose, Bld: 107 mg/dL — ABNORMAL HIGH (ref 70–99)
Potassium: 3.9 mmol/L (ref 3.5–5.1)
Sodium: 140 mmol/L (ref 135–145)
Total Bilirubin: 0.4 mg/dL (ref 0.3–1.2)
Total Protein: 5.4 g/dL — ABNORMAL LOW (ref 6.5–8.1)

## 2018-12-11 LAB — GLUCOSE, CAPILLARY
Glucose-Capillary: 101 mg/dL — ABNORMAL HIGH (ref 70–99)
Glucose-Capillary: 117 mg/dL — ABNORMAL HIGH (ref 70–99)
Glucose-Capillary: 143 mg/dL — ABNORMAL HIGH (ref 70–99)
Glucose-Capillary: 89 mg/dL (ref 70–99)
Glucose-Capillary: 95 mg/dL (ref 70–99)

## 2018-12-11 LAB — CULTURE, BLOOD (ROUTINE X 2): Special Requests: ADEQUATE

## 2018-12-11 LAB — TSH: TSH: 1.538 u[IU]/mL (ref 0.350–4.500)

## 2018-12-11 LAB — C-PEPTIDE: C-Peptide: 2.3 ng/mL (ref 1.1–4.4)

## 2018-12-11 LAB — CK: Total CK: 14149 U/L — ABNORMAL HIGH (ref 49–397)

## 2018-12-11 LAB — INSULIN, RANDOM: Insulin: 7.7 u[IU]/mL (ref 2.6–24.9)

## 2018-12-11 LAB — MAGNESIUM: Magnesium: 1.2 mg/dL — ABNORMAL LOW (ref 1.7–2.4)

## 2018-12-11 MED ORDER — MAGNESIUM SULFATE 2 GM/50ML IV SOLN
2.0000 g | Freq: Once | INTRAVENOUS | Status: AC
  Administered 2018-12-11: 2 g via INTRAVENOUS
  Filled 2018-12-11: qty 50

## 2018-12-11 MED ORDER — SODIUM BICARBONATE 650 MG PO TABS
650.0000 mg | ORAL_TABLET | Freq: Every day | ORAL | Status: AC
  Administered 2018-12-12 – 2018-12-13 (×2): 650 mg via ORAL
  Filled 2018-12-11 (×2): qty 1

## 2018-12-11 MED ORDER — SENNOSIDES-DOCUSATE SODIUM 8.6-50 MG PO TABS
1.0000 | ORAL_TABLET | Freq: Two times a day (BID) | ORAL | Status: DC
  Administered 2018-12-11 – 2018-12-15 (×9): 1 via ORAL
  Filled 2018-12-11 (×9): qty 1

## 2018-12-11 NOTE — Progress Notes (Signed)
Pt is  c/o of constipation. Paged MD

## 2018-12-11 NOTE — Progress Notes (Signed)
PROGRESS NOTE  Donald Jenkins QQP:[id number] DOB: 12-20-1970 DOA: 12/07/2018 PCP: Patient, No Pcp Per  HPI/Recap of past 24 hours:   Still c/o back pain, requiring frequent iv morphine for pain control  He appear less confused today, though still have poor memory, he is able to  remember went to bed around 7pm on Tuesday sober, but work up on the floor the next day  He denies iv drug use C/o being constipated   Assessment/Plan: Principal Problem:   Rhabdomyolysis Active Problems:   Essential hypertension, benign   Hypersomnolence disorder, with mental disorder, acute, moderate   SIRS (systemic inflammatory response syndrome) (HCC)   Back pain   Depression with anxiety   Abnormal LFTs   AKI (acute kidney injury) ([place])   Elevated troponin   Acute metabolic encephalopathy   Metabolic acidosis   Compression fracture of body of thoracic vertebra (HCC)   Hypoglycemia   Bacteremia   Fall   Rhabdomyolysis -suspect due to prolonged immobilization  -improving on ivf, continue ivf  Elevated ast/alt Likely from rhabdomyolysis, improving Hepatitis panel negative   SIRS/sepsis/strep bacteremia -he received vanc/cefepime initially, currently on rocephin -wbc normalized, will repeat blood culture  Hypoglycemia: Had hypoglycemia event on 6/11 evening He received d10 infusion for 10hrs, currently on lr/d5 Blood glucose stable, continue LR/d5  Insulin level, c-peptide unremarkable sulfonylurea level in process  Hypomagnesemia: replace mag  Acute metabolic encephalopathy  -CT-head and C spin-->results showedno acute intracranial abnormalities or C-spine fracture. -likely multifactorial  -he does has leukocytosis, bacteremia, aki, polypharmacy? -he reports has not taking any xanax for the last two weeks, he reports he is started on ritalin a month ago which he think Is helping -he has been on buspirone for a long time -he follows his psychiatrist closely  -appear  improving, still have poor memory -refer to outpatient cardiac monitor for 30days, message sent to cardiology through epic   AKI: Likely from rhabdo Cr 2.8 on presentation, cr normalized  Metabolic acidosis: Bicarbonate 21, anion gap 8 this am. S/p bicarb supplement.  Volatiles negative. Ethylene glycol level <5. - continue  bicarb for two more days, improving -IVF as above  Back pain/acute T8 compression fracture - he reports "fell from his couch and landed hard on the floor on his back" -still requiring prn iv morphine for pain control -IR consulted for image-guided T8 KP/VP on 6/16  HTN; Home meds lisinopril/HCTZ held since admission bp stable without bp meds, continue monitor  Troponin mild and flat at 0.05- 0.06-0.04 Denies chest pain Tele unremarkable  will d/c tele   Psych: Continue buspirone, ritalin Change xanax to prn, he reports has not been taking any xanax for the last two weeks   COVID19 screening negative  Code Status: full  Family Communication: patient   Disposition Plan: not ready to discharge, still requiring frequent iv morphine for pain control, ck remain significantly elevated   Consultants:  IR  Procedures:  Plan for  image-guided T8 KP/VP on 6/16  Antibiotics:  Vanc/cefepime x1 on admission  Rocephin    Objective: BP (!) 163/92   Pulse 73   Temp 97.8 F (36.6 C) (Oral)   Resp 18   Ht 6\' 1"  (1.854 m)   Wt 83 kg   SpO2 99%   BMI 24.13 kg/m   Intake/Output Summary (Last 24 hours) at 12/11/2018 1047 Last data filed at 12/11/2018 0050 Gross per 24 hour  Intake 1152.98 ml  Output 2600 ml  Net -1447.02 ml  Filed Weights   12/09/18 0359 12/10/18 0432 12/11/18 0421  Weight: 83 kg 82.8 kg 83 kg    Exam: Patient is examined daily including today on 12/11/2018, exams remain the same as of yesterday except that has changed    General:  NAD, poor memory   Cardiovascular: RRR  Respiratory: CTABL  Abdomen: Soft/ND/NT,  positive BS  Musculoskeletal: No Edema  Neuro: alert, oriented   Data Reviewed: Basic Metabolic Panel: Recent Labs  Lab 12/07/18 1813 12/08/18 0349 12/09/18 0656 12/10/18 0452 12/11/18 0628  NA 138 136 137 141 140  K 4.3 4.1 3.7 3.2* 3.9  CL 101 108 108 104 103  CO2 19* 14* 21* 26 29  GLUCOSE 113* 90 109* 95 107*  BUN 55* 47* 21* 7 5*  CREATININE 2.80* 2.04* 1.12 0.97 0.81  CALCIUM 9.4 8.1* 8.4* 8.9 8.7*  MG  --  2.6*  --   --  1.2*   Liver Function Tests: Recent Labs  Lab 12/07/18 1813 12/08/18 0349 12/09/18 0656 12/10/18 0452 12/11/18 0628  AST 684* 775* 629* 595* 421*  ALT 140* 161* 206* 234* 201*  ALKPHOS 68 54 37* 41 44  BILITOT 1.2 1.0 0.8 0.5 0.4  PROT 8.2* 6.5 4.9* 5.6* 5.4*  ALBUMIN 4.7 3.6 2.7* 3.0* 2.7*   No results for input(s): LIPASE, AMYLASE in the last 168 hours. No results for input(s): AMMONIA in the last 168 hours. CBC: Recent Labs  Lab 12/07/18 1813 12/08/18 0349 12/09/18 0656 12/10/18 0452  WBC 23.5* 16.0* 7.2 6.2  NEUTROABS 20.8*  --   --   --   HGB 17.1* 13.1 11.8* 12.6*  HCT 48.7 38.1* 33.2* 35.6*  MCV 89.0 91.6 88.1 88.6  PLT 293 214 188 190   Cardiac Enzymes:   Recent Labs  Lab 12/07/18 1813 12/08/18 0108 12/08/18 0349 12/08/18 1142 12/09/18 0656 12/10/18 0452 12/11/18 0628  CKTOTAL >50,000*  --  >50,000*  --  45,248* 32,170* 14,149*  TROPONINI 0.05* 0.05* 0.06* 0.04*  --   --   --    BNP (last 3 results) No results for input(s): BNP in the last 8760 hours.  ProBNP (last 3 results) No results for input(s): PROBNP in the last 8760 hours.  CBG: Recent Labs  Lab 12/10/18 1238 12/10/18 1631 12/10/18 2047 12/11/18 0048 12/11/18 0416  GLUCAP 110* 103* 93 143* 89    Recent Results (from the past 240 hour(s))  SARS Coronavirus 2 (CEPHEID - Performed in [facility] hospital lab), Hosp Order     Status: None   Collection Time: 12/07/18 10:36 PM   Specimen: Nasopharyngeal Swab  Result Value Ref Range Status    SARS Coronavirus 2 NEGATIVE NEGATIVE Final    Comment: (NOTE) If result is NEGATIVE SARS-CoV-2 target nucleic acids are NOT DETECTED. The SARS-CoV-2 RNA is generally detectable in upper and lower  respiratory specimens during the acute phase of infection. The lowest  concentration of SARS-CoV-2 viral copies this assay can detect is 250  copies / mL. A negative result does not preclude SARS-CoV-2 infection  and should not be used as the sole basis for treatment or other  patient management decisions.  A negative result may occur with  improper specimen collection / handling, submission of specimen other  than nasopharyngeal swab, presence of viral mutation(s) within the  areas targeted by this assay, and inadequate number of viral copies  (<250 copies / mL). A negative result must be combined with clinical  observations, patient history, and epidemiological information.  If result is POSITIVE SARS-CoV-2 target nucleic acids are DETECTED. The SARS-CoV-2 RNA is generally detectable in upper and lower  respiratory specimens dur ing the acute phase of infection.  Positive  results are indicative of active infection with SARS-CoV-2.  Clinical  correlation with patient history and other diagnostic information is  necessary to determine patient infection status.  Positive results do  not rule out bacterial infection or co-infection with other viruses. If result is PRESUMPTIVE POSTIVE SARS-CoV-2 nucleic acids MAY BE PRESENT.   A presumptive positive result was obtained on the submitted specimen  and confirmed on repeat testing.  While 2019 novel coronavirus  (SARS-CoV-2) nucleic acids may be present in the submitted sample  additional confirmatory testing may be necessary for epidemiological  and / or clinical management purposes  to differentiate between  SARS-CoV-2 and other Sarbecovirus currently known to infect humans.  If clinically indicated additional testing with an alternate test   methodology [phone]) is advised. The SARS-CoV-2 RNA is generally  detectable in upper and lower respiratory sp ecimens during the acute  phase of infection. The expected result is Negative. Fact Sheet for Patients:  StrictlyIdeas.no Fact Sheet for Healthcare Providers: BankingDealers.co.za This test is not yet approved or cleared by the [place] FDA and has been authorized for detection and/or diagnosis of SARS-CoV-2 by FDA under an Emergency Use Authorization (EUA).  This EUA will remain in effect (meaning this test can be used) for the duration of the COVID-19 declaration under Section 564(b)(1) of the Act, 21 U.S.C. section 360bbb-3(b)(1), unless the authorization is terminated or revoked sooner. Performed at [facility], [place] [address], [place], [place] [address]   Urine Culture     Status: None   Collection Time: 12/08/18 12:52 AM   Specimen: Urine, Random  Result Value Ref Range Status   Specimen Description URINE, RANDOM  Final   Special Requests NONE  Final   Culture   Final    NO GROWTH Performed at [facility], [place] [address], [place], [place] [address]    Report Status 12/09/2018 FINAL  Final  Culture, blood (x 2)     Status: None (Preliminary result)   Collection Time: 12/08/18  1:08 AM   Specimen: BLOOD  Result Value Ref Range Status   Specimen Description BLOOD LEFT ARM  Final   Special Requests   Final    BOTTLES DRAWN AEROBIC ONLY Blood Culture results may not be optimal due to an inadequate volume of blood received in culture bottles   Culture   Final    NO GROWTH 3 DAYS Performed at [facility], [place] [address] [place], [place] [address]    Report Status PENDING  Incomplete  Culture, blood (x 2)     Status: Abnormal   Collection Time: 12/08/18  1:08 AM   Specimen: BLOOD  Result Value Ref Range Status   Specimen Description BLOOD RIGHT ARM  Final   Special Requests   Final     BOTTLES DRAWN AEROBIC AND ANAEROBIC Blood Culture adequate volume   Culture  Setup Time   Final    GRAM POSITIVE COCCI IN CHAINS AEROBIC BOTTLE ONLY CRITICAL RESULT CALLED TO, READ BACK BY AND VERIFIED WITH: PHRMD V BRYK @0052  12/09/18 BY S GEZAHEGN    Culture (A)  Final    VIRIDANS STREPTOCOCCUS THE SIGNIFICANCE OF ISOLATING THIS ORGANISM FROM A SINGLE SET OF BLOOD CULTURES WHEN MULTIPLE SETS ARE DRAWN IS UNCERTAIN. PLEASE NOTIFY THE MICROBIOLOGY  DEPARTMENT WITHIN ONE WEEK IF SPECIATION AND SENSITIVITIES ARE REQUIRED. Performed at [facility], [place] [address], [place], [place] [address]    Report Status 12/11/2018 FINAL  Final  Blood Culture ID Panel (Reflexed)     Status: Abnormal   Collection Time: 12/08/18  1:08 AM  Result Value Ref Range Status   Enterococcus species NOT DETECTED NOT DETECTED Final   Listeria monocytogenes NOT DETECTED NOT DETECTED Final   Staphylococcus species NOT DETECTED NOT DETECTED Final   Staphylococcus aureus (BCID) NOT DETECTED NOT DETECTED Final   Streptococcus species DETECTED (A) NOT DETECTED Final    Comment: Not Enterococcus species, Streptococcus agalactiae, Streptococcus pyogenes, or Streptococcus pneumoniae. CRITICAL RESULT CALLED TO, READ BACK BY AND VERIFIED WITH: PHRMD V BRYK @0052  12/09/18 BY S GEZAHEGN    Streptococcus agalactiae NOT DETECTED NOT DETECTED Final   Streptococcus pneumoniae NOT DETECTED NOT DETECTED Final   Streptococcus pyogenes NOT DETECTED NOT DETECTED Final   Acinetobacter baumannii NOT DETECTED NOT DETECTED Final   Enterobacteriaceae species NOT DETECTED NOT DETECTED Final   Enterobacter cloacae complex NOT DETECTED NOT DETECTED Final   Escherichia coli NOT DETECTED NOT DETECTED Final   Klebsiella oxytoca NOT DETECTED NOT DETECTED Final   Klebsiella pneumoniae NOT DETECTED NOT DETECTED Final   Proteus species NOT DETECTED NOT DETECTED Final   Serratia marcescens NOT DETECTED NOT DETECTED Final   Haemophilus  influenzae NOT DETECTED NOT DETECTED Final   Neisseria meningitidis NOT DETECTED NOT DETECTED Final   Pseudomonas aeruginosa NOT DETECTED NOT DETECTED Final   Candida albicans NOT DETECTED NOT DETECTED Final   Candida glabrata NOT DETECTED NOT DETECTED Final   Candida krusei NOT DETECTED NOT DETECTED Final   Candida parapsilosis NOT DETECTED NOT DETECTED Final   Candida tropicalis NOT DETECTED NOT DETECTED Final    Comment: Performed at [facility], [place] [address], [place], [place] [address]     Studies: No results found.  Scheduled Meds: . aspirin EC  81 mg Oral Daily  . busPIRone  15 mg Oral BID  . heparin injection (subcutaneous)  5,000 Units Subcutaneous Q8H  . methylphenidate  5 mg Oral TID WC  . senna-docusate  1 tablet Oral BID  . sodium bicarbonate  650 mg Oral TID    Continuous Infusions: . sodium chloride 1,000 mL (12/11/18 0949)  . cefTRIAXone (ROCEPHIN)  IV 2 g (12/10/18 0950)  . dextrose 5% lactated ringers 1,000 mL (12/11/18 0200)  . magnesium sulfate bolus IVPB 2 g (12/11/18 0953)  . sodium chloride 999 mL/hr at 12/08/18 0849     Time spent: 107mins I have personally reviewed and interpreted on  12/11/2018 daily labs, tele strips, imagings as discussed above under date review session and assessment and plans.  I reviewed all nursing notes, pharmacy notes, consultant notes,  vitals, pertinent old records  I have discussed plan of care as described above with RN , patient on 12/11/2018   Florencia Reasons MD, PhD  [facility] Pager [phone]. If 7PM-7AM, please contact night-coverage at [email or web address], password [facility] 12/11/2018, 10:47 AM  LOS: 3 days

## 2018-12-11 NOTE — Progress Notes (Signed)
Pt continues to complain of back pain and nausea. PRN's given. He is encouraged to get up for meals and to eat. However he is refusing at this time, stating that he does not have appetite.

## 2018-12-12 DIAGNOSIS — M546 Pain in thoracic spine: Secondary | ICD-10-CM

## 2018-12-12 DIAGNOSIS — T796XXA Traumatic ischemia of muscle, initial encounter: Secondary | ICD-10-CM

## 2018-12-12 LAB — URINALYSIS, COMPLETE (UACMP) WITH MICROSCOPIC
Bacteria, UA: NONE SEEN
Bilirubin Urine: NEGATIVE
Glucose, UA: NEGATIVE mg/dL
Hgb urine dipstick: NEGATIVE
Ketones, ur: NEGATIVE mg/dL
Leukocytes,Ua: NEGATIVE
Nitrite: NEGATIVE
Protein, ur: NEGATIVE mg/dL
Specific Gravity, Urine: 1.009 (ref 1.005–1.030)
pH: 9 — ABNORMAL HIGH (ref 5.0–8.0)

## 2018-12-12 LAB — COMPREHENSIVE METABOLIC PANEL
ALT: 176 U/L — ABNORMAL HIGH (ref 0–44)
AST: 272 U/L — ABNORMAL HIGH (ref 15–41)
Albumin: 2.6 g/dL — ABNORMAL LOW (ref 3.5–5.0)
Alkaline Phosphatase: 46 U/L (ref 38–126)
Anion gap: 9 (ref 5–15)
BUN: <5 mg/dL — ABNORMAL LOW (ref 6–20)
CO2: 30 mmol/L (ref 22–32)
Calcium: 8.7 mg/dL — ABNORMAL LOW (ref 8.9–10.3)
Chloride: 101 mmol/L (ref 98–111)
Creatinine, Ser: 0.78 mg/dL (ref 0.61–1.24)
GFR calc Af Amer: >60 mL/min (ref >60)
GFR calc non Af Amer: >60 mL/min (ref >60)
Glucose, Bld: 105 mg/dL — ABNORMAL HIGH (ref 70–99)
Potassium: 3 mmol/L — ABNORMAL LOW (ref 3.5–5.1)
Sodium: 140 mmol/L (ref 135–145)
Total Bilirubin: 0.4 mg/dL (ref 0.3–1.2)
Total Protein: 5.2 g/dL — ABNORMAL LOW (ref 6.5–8.1)

## 2018-12-12 LAB — MAGNESIUM: Magnesium: 1.3 mg/dL — ABNORMAL LOW (ref 1.7–2.4)

## 2018-12-12 LAB — CK: Total CK: 7363 U/L — ABNORMAL HIGH (ref 49–397)

## 2018-12-12 MED ORDER — ALUM & MAG HYDROXIDE-SIMETH 200-200-20 MG/5ML PO SUSP
30.0000 mL | Freq: Four times a day (QID) | ORAL | Status: DC | PRN
  Administered 2018-12-12 – 2018-12-14 (×4): 30 mL via ORAL
  Filled 2018-12-12 (×4): qty 30

## 2018-12-12 MED ORDER — MAGNESIUM OXIDE 400 (241.3 MG) MG PO TABS
400.0000 mg | ORAL_TABLET | Freq: Two times a day (BID) | ORAL | Status: AC
  Administered 2018-12-12 – 2018-12-14 (×6): 400 mg via ORAL
  Filled 2018-12-12 (×6): qty 1

## 2018-12-12 MED ORDER — HEPARIN SODIUM (PORCINE) 5000 UNIT/ML IJ SOLN: 5000.0000 [IU] | Freq: Three times a day (TID) | INTRAMUSCULAR | Status: DC

## 2018-12-12 MED ORDER — POTASSIUM CHLORIDE CRYS ER 20 MEQ PO TBCR
40.0000 meq | EXTENDED_RELEASE_TABLET | ORAL | Status: AC
  Administered 2018-12-12 (×2): 40 meq via ORAL
  Filled 2018-12-12 (×2): qty 2

## 2018-12-12 MED ORDER — VANCOMYCIN HCL IN DEXTROSE 1-5 GM/200ML-% IV SOLN
1000.0000 mg | INTRAVENOUS | Status: AC
  Filled 2018-12-12: qty 200

## 2018-12-12 MED ORDER — MAGNESIUM SULFATE 2 GM/50ML IV SOLN
2.0000 g | Freq: Once | INTRAVENOUS | Status: AC
  Administered 2018-12-12: 2 g via INTRAVENOUS
  Filled 2018-12-12: qty 50

## 2018-12-12 NOTE — Progress Notes (Signed)
PROGRESS NOTE    Donald Jenkins  XVQ:[id number] DOB: 01-14-1971 DOA: 12/07/2018 PCP: Patient, No Pcp Per   Brief Narrative: 48 year old male with history of hypertension, anxiety, hypersomnic disorder who was admitted after being found on the floor of his home, reported increased back pain.  In the ER significant rhabdomyolysis for more than 50,000 CK and T8 compression fracture.  Patient reports he has had  back pain and gets flareups intermittently, worse for 1 week. In ER, CK >50,000, alcohol less than 10, pending COVID-19 test, UDS positive for THC, troponin 0.05, lactic acid 1.5, WBC 23.5, urinalysis negative, Tylenol less than 10, salicylate level less than 7, abnormal liver function (ALP 68, AST 684, ALT 140, total bilirubin 1.2), AKI with creatinine 2.80, BUN 55), chest x-ray negative.  Patient is placed on telemetry bed for observation. Negative covid 19 test  Subjective: Seen this morning.  He is much more coherent alert awake and oriented.  Asking multiple questions. Still complains of ongoing back pain needing IV opiates  Assessment & Plan:   Severe Rhabdomyolysis CK more than 50,000.  Suspect due to prolonged immobilization.  Improved to 7k range, continue hydration with IV fluids.    Strep bacteremia on 12/08/18/SIRS. Informed by Junie Panning from ID, recommend stopping antibiotics as strep in only 1 out of 3 blood culture likely contamination, no obvious infectious source identified.  His blood culture  Repeat 6/14 NGTD  Acute on chronic back pain with imaging showing acute T8 compression fracture: her reports he "fell from his couch and landed hard on the floor on his back". patient continues to need IV morphine for pain control, IR has been consulted possible kyphoplasty tomorrow keep n.p.o. past midnight stop heparin product.  Essential hypertension, ON LISINOPRIL/HCTZ- meds remains on hold.  Blood pressure stable.  Hypersomnolence disorder, with mental disorder, acute, moderate  /Depression with anxiety: Mood is stable.  Abnormal LFTs: Suspect due to rhabdomyolysis, improved.  Hepatitis panel negative.  AKI creatinine 2.8 on admission.  Likely due to rhabdomyolysis.  Now improved.  Elevated troponin mild 05- 0.06-0.04: Without chest pain, likely in the setting of rhabdomyolysis.  Acute metabolic encephalopathy: Likely multifactorial, CT head and C-spine no acute intracranial finding.  Patient has been not taking any Xanax for the last 2 weeks and was started on vitamin a month ago, has been on buspirone for a long time.  At this time overall mental status seems to have improved.  Still has some poor memory and appears somewhat forgetful.  referred to outpatient cardiac monitor for 30 days. message was sent to cardiology to review by Dr Erlinda Hong.  Metabolic acidosis: Resolved.  Bicarb improved to 30, will stop bicarbonate..  Hypoglycemia: On 6/11 evening.  Patient remains on dextrose fluid.  Insulin level C-peptide is unremarkable.  SFU in process.  Hypokalemia/hypomagnesemia: Being repleted aggressively.  Monitor.  COVID19 screening negative  DVT prophylaxis: SCD. Hold Heparin Code Status: full code Family Communication: Plan of care discussed with patient and is in agreement.   Disposition Plan: remains inpatient pending clinical improvement.  Plan for possible kyphoplasty tomorrow.  Consultants:  IR  Procedures:  Plan for  image-guided T8KP/VP on 6/16  Antibiotics:  Vanc/cefepime x1 on admission  Rocephin  Stopped 6/15  Antimicrobials: Anti-infectives (From admission, onward)   Start     Dose/Rate Route Frequency Ordered Stop   12/13/18 0000  vancomycin (VANCOCIN) IVPB 1000 mg/200 mL premix     1,000 mg 200 mL/hr over 60 Minutes Intravenous To Radiology 12/12/18 1259  12/14/18 0000   12/09/18 0900  cefTRIAXone (ROCEPHIN) 2 g in sodium chloride 0.9 % 100 mL IVPB  Status:  Discontinued     2 g 200 mL/hr over 30 Minutes Intravenous Every 24 hours  12/09/18 0850 12/12/18 1313   12/08/18 1000  ceFEPIme (MAXIPIME) 2 g in sodium chloride 0.9 % 100 mL IVPB  Status:  Discontinued     2 g 200 mL/hr over 30 Minutes Intravenous Every 12 hours 12/08/18 0057 12/08/18 1341   12/08/18 0100  vancomycin (VANCOCIN) 1,500 mg in sodium chloride 0.9 % 500 mL IVPB     1,500 mg 250 mL/hr over 120 Minutes Intravenous  Once 12/08/18 0052 12/08/18 0501   12/08/18 0100  ceFEPIme (MAXIPIME) 2 g in sodium chloride 0.9 % 100 mL IVPB     2 g 200 mL/hr over 30 Minutes Intravenous  Once 12/08/18 0052 12/08/18 0216   12/08/18 0057  vancomycin variable dose per unstable renal function (pharmacist dosing)  Status:  Discontinued      Does not apply See admin instructions 12/08/18 0057 12/08/18 1341       Objective: Vitals:   12/11/18 1252 12/11/18 1426 12/12/18 0331 12/12/18 1138  BP: (!) 147/96 (!) 153/85 (!) 147/92 (!) 142/107  Pulse: 68 65 82 78  Resp: 20 18 18 18   Temp:  (!) 97.3 F (36.3 C) (!) 97 F (36.1 C) 98.9 F (37.2 C)  TempSrc:  Oral Oral Oral  SpO2: 99% 100% 98% 100%  Weight:   81.2 kg   Height:        Intake/Output Summary (Last 24 hours) at 12/12/2018 1313 Last data filed at 12/12/2018 1145 Gross per 24 hour  Intake 2733.05 ml  Output 3100 ml  Net -366.95 ml   Filed Weights   12/10/18 0432 12/11/18 0421 12/12/18 0331  Weight: 82.8 kg 83 kg 81.2 kg   Weight change: -1.724 kg  Body mass index is 23.63 kg/m.  Intake/Output from previous day: 06/14 0701 - 06/15 0700 In: 5474.3 [P.O.:1080; I.V.:4144.3; IV Piggyback:250] Out: 3100 [Urine:3100] Intake/Output this shift: Total I/O In: -  Out: 900 [Urine:900]  Examination:  General exam: Appears calm and comfortable, not in acute distress, somewhat forgertful HEENT:PERRL,Oral mucosa moist, Ear/Nose normal on gross exam Respiratory system: Bilateral equal air entry, normal vesicular breath sounds, no wheezes or crackles  Cardiovascular system: S1 & S2 heard,No JVD, murmurs.  Gastrointestinal system: Abdomen is  soft, non tender, non distended, BS +  Nervous System:Alert and oriented. No focal neurological deficits/moving extremities, sensation intact. Extremities: No edema, no clubbing, distal peripheral pulses palpable. Skin: No rashes, lesions, no icterus MSK: Normal muscle bulk,tone ,power Tenderness on the mid back  Medications:  Scheduled Meds: . aspirin EC  81 mg Oral Daily  . busPIRone  15 mg Oral BID  . magnesium oxide  400 mg Oral BID  . methylphenidate  5 mg Oral TID WC  . senna-docusate  1 tablet Oral BID  . sodium bicarbonate  650 mg Oral Daily   Continuous Infusions: . sodium chloride 1,000 mL (12/11/18 0949)  . dextrose 5% lactated ringers 125 mL/hr at 12/12/18 0918  . sodium chloride 999 mL/hr at 12/08/18 0849  . [START ON 12/13/2018] vancomycin      Data Reviewed: I have personally reviewed following labs and imaging studies  CBC: Recent Labs  Lab 12/07/18 1813 12/08/18 0349 12/09/18 0656 12/10/18 0452  WBC 23.5* 16.0* 7.2 6.2  NEUTROABS 20.8*  --   --   --  HGB 17.1* 13.1 11.8* 12.6*  HCT 48.7 38.1* 33.2* 35.6*  MCV 89.0 91.6 88.1 88.6  PLT 293 214 188 299   Basic Metabolic Panel: Recent Labs  Lab 12/08/18 0349 12/09/18 0656 12/10/18 0452 12/11/18 0628 12/12/18 0503  NA 136 137 141 140 140  K 4.1 3.7 3.2* 3.9 3.0*  CL 108 108 104 103 101  CO2 14* 21* 26 29 30   GLUCOSE 90 109* 95 107* 105*  BUN 47* 21* 7 5* <5*  CREATININE 2.04* 1.12 0.97 0.81 0.78  CALCIUM 8.1* 8.4* 8.9 8.7* 8.7*  MG 2.6*  --   --  1.2* 1.3*   GFR: Estimated Creatinine Clearance: 129 mL/min (by C-G formula based on SCr of 0.78 mg/dL). Liver Function Tests: Recent Labs  Lab 12/08/18 0349 12/09/18 0656 12/10/18 0452 12/11/18 0628 12/12/18 0503  AST 775* 629* 595* 421* 272*  ALT 161* 206* 234* 201* 176*  ALKPHOS 54 37* 41 44 46  BILITOT 1.0 0.8 0.5 0.4 0.4  PROT 6.5 4.9* 5.6* 5.4* 5.2*  ALBUMIN 3.6 2.7* 3.0* 2.7* 2.6*   No results  for input(s): LIPASE, AMYLASE in the last 168 hours. No results for input(s): AMMONIA in the last 168 hours. Coagulation Profile: No results for input(s): INR, PROTIME in the last 168 hours. Cardiac Enzymes: Recent Labs  Lab 12/07/18 1813 12/08/18 0108 12/08/18 0349 12/08/18 1142 12/09/18 0656 12/10/18 0452 12/11/18 0628 12/12/18 0503  CKTOTAL >50,000*  --  >50,000*  --  45,248* 32,170* 14,149* 7,363*  TROPONINI 0.05* 0.05* 0.06* 0.04*  --   --   --   --    BNP (last 3 results) No results for input(s): PROBNP in the last 8760 hours. HbA1C: No results for input(s): HGBA1C in the last 72 hours. CBG: Recent Labs  Lab 12/11/18 0048 12/11/18 0416 12/11/18 1300 12/11/18 1637 12/11/18 2037  GLUCAP 143* 89 101* 117* 95   Lipid Profile: No results for input(s): CHOL, HDL, LDLCALC, TRIG, CHOLHDL, LDLDIRECT in the last 72 hours. Thyroid Function Tests: Recent Labs    12/11/18 0628  TSH 1.538   Anemia Panel: No results for input(s): VITAMINB12, FOLATE, FERRITIN, TIBC, IRON, RETICCTPCT in the last 72 hours. Sepsis Labs: Recent Labs  Lab 12/07/18 1940 12/08/18 0108  PROCALCITON  --  0.37  LATICACIDVEN 1.5 1.3    Recent Results (from the past 240 hour(s))  SARS Coronavirus 2 (CEPHEID - Performed in [place] hospital lab), Hosp Order     Status: None   Collection Time: 12/07/18 10:36 PM   Specimen: Nasopharyngeal Swab  Result Value Ref Range Status   SARS Coronavirus 2 NEGATIVE NEGATIVE Final    Comment: (NOTE) If result is NEGATIVE SARS-CoV-2 target nucleic acids are NOT DETECTED. The SARS-CoV-2 RNA is generally detectable in upper and lower  respiratory specimens during the acute phase of infection. The lowest  concentration of SARS-CoV-2 viral copies this assay can detect is 250  copies / mL. A negative result does not preclude SARS-CoV-2 infection  and should not be used as the sole basis for treatment or other  patient management decisions.  A negative result  may occur with  improper specimen collection / handling, submission of specimen other  than nasopharyngeal swab, presence of viral mutation(s) within the  areas targeted by this assay, and inadequate number of viral copies  (<250 copies / mL). A negative result must be combined with clinical  observations, patient history, and epidemiological information. If result is POSITIVE SARS-CoV-2 target nucleic acids are  DETECTED. The SARS-CoV-2 RNA is generally detectable in upper and lower  respiratory specimens dur ing the acute phase of infection.  Positive  results are indicative of active infection with SARS-CoV-2.  Clinical  correlation with patient history and other diagnostic information is  necessary to determine patient infection status.  Positive results do  not rule out bacterial infection or co-infection with other viruses. If result is PRESUMPTIVE POSTIVE SARS-CoV-2 nucleic acids MAY BE PRESENT.   A presumptive positive result was obtained on the submitted specimen  and confirmed on repeat testing.  While 2019 novel coronavirus  (SARS-CoV-2) nucleic acids may be present in the submitted sample  additional confirmatory testing may be necessary for epidemiological  and / or clinical management purposes  to differentiate between  SARS-CoV-2 and other Sarbecovirus currently known to infect humans.  If clinically indicated additional testing with an alternate test  methodology [phone]) is advised. The SARS-CoV-2 RNA is generally  detectable in upper and lower respiratory sp ecimens during the acute  phase of infection. The expected result is Negative. Fact Sheet for Patients:  StrictlyIdeas.no Fact Sheet for Healthcare Providers: BankingDealers.co.za This test is not yet approved or cleared by the [place] FDA and has been authorized for detection and/or diagnosis of SARS-CoV-2 by FDA under an Emergency Use Authorization (EUA).   This EUA will remain in effect (meaning this test can be used) for the duration of the COVID-19 declaration under Section 564(b)(1) of the Act, 21 U.S.C. section 360bbb-3(b)(1), unless the authorization is terminated or revoked sooner. Performed at [facility], [place] [address], [place], [place] [address]   Urine Culture     Status: None   Collection Time: 12/08/18 12:52 AM   Specimen: Urine, Random  Result Value Ref Range Status   Specimen Description URINE, RANDOM  Final   Special Requests NONE  Final   Culture   Final    NO GROWTH Performed at [facility], [place] [address], [place], [place] [address]    Report Status 12/09/2018 FINAL  Final  Culture, blood (x 2)     Status: None (Preliminary result)   Collection Time: 12/08/18  1:08 AM   Specimen: BLOOD  Result Value Ref Range Status   Specimen Description BLOOD LEFT ARM  Final   Special Requests   Final    BOTTLES DRAWN AEROBIC ONLY Blood Culture results may not be optimal due to an inadequate volume of blood received in culture bottles   Culture   Final    NO GROWTH 3 DAYS Performed at [facility], [place] [address], [place], [place] [address]    Report Status PENDING  Incomplete  Culture, blood (x 2)     Status: Abnormal   Collection Time: 12/08/18  1:08 AM   Specimen: BLOOD  Result Value Ref Range Status   Specimen Description BLOOD RIGHT ARM  Final   Special Requests   Final    BOTTLES DRAWN AEROBIC AND ANAEROBIC Blood Culture adequate volume   Culture  Setup Time   Final    GRAM POSITIVE COCCI IN CHAINS AEROBIC BOTTLE ONLY CRITICAL RESULT CALLED TO, READ BACK BY AND VERIFIED WITH: PHRMD V BRYK @0052  12/09/18 BY S GEZAHEGN    Culture (A)  Final    VIRIDANS STREPTOCOCCUS THE SIGNIFICANCE OF ISOLATING THIS ORGANISM FROM A SINGLE SET OF BLOOD CULTURES WHEN MULTIPLE SETS ARE DRAWN IS UNCERTAIN. PLEASE NOTIFY THE MICROBIOLOGY DEPARTMENT WITHIN ONE WEEK IF SPECIATION AND SENSITIVITIES ARE REQUIRED.  Performed at [facility], [place] [address] [place], [place] [address]    Report Status 12/11/2018 FINAL  Final  Blood Culture ID Panel (Reflexed)     Status: Abnormal   Collection Time: 12/08/18  1:08 AM  Result Value Ref Range Status   Enterococcus species NOT DETECTED NOT DETECTED Final   Listeria monocytogenes NOT DETECTED NOT DETECTED Final   Staphylococcus species NOT DETECTED NOT DETECTED Final   Staphylococcus aureus (BCID) NOT DETECTED NOT DETECTED Final   Streptococcus species DETECTED (A) NOT DETECTED Final    Comment: Not Enterococcus species, Streptococcus agalactiae, Streptococcus pyogenes, or Streptococcus pneumoniae. CRITICAL RESULT CALLED TO, READ BACK BY AND VERIFIED WITH: PHRMD V BRYK @0052  12/09/18 BY S GEZAHEGN    Streptococcus agalactiae NOT DETECTED NOT DETECTED Final   Streptococcus pneumoniae NOT DETECTED NOT DETECTED Final   Streptococcus pyogenes NOT DETECTED NOT DETECTED Final   Acinetobacter baumannii NOT DETECTED NOT DETECTED Final   Enterobacteriaceae species NOT DETECTED NOT DETECTED Final   Enterobacter cloacae complex NOT DETECTED NOT DETECTED Final   Escherichia coli NOT DETECTED NOT DETECTED Final   Klebsiella oxytoca NOT DETECTED NOT DETECTED Final   Klebsiella pneumoniae NOT DETECTED NOT DETECTED Final   Proteus species NOT DETECTED NOT DETECTED Final   Serratia marcescens NOT DETECTED NOT DETECTED Final   Haemophilus influenzae NOT DETECTED NOT DETECTED Final   Neisseria meningitidis NOT DETECTED NOT DETECTED Final   Pseudomonas aeruginosa NOT DETECTED NOT DETECTED Final   Candida albicans NOT DETECTED NOT DETECTED Final   Candida glabrata NOT DETECTED NOT DETECTED Final   Candida krusei NOT DETECTED NOT DETECTED Final   Candida parapsilosis NOT DETECTED NOT DETECTED Final   Candida tropicalis NOT DETECTED NOT DETECTED Final    Comment: Performed at [facility], [place] [address], [place], [place] [address]  Culture, blood (routine x  2)     Status: None (Preliminary result)   Collection Time: 12/11/18 10:34 AM   Specimen: BLOOD  Result Value Ref Range Status   Specimen Description BLOOD LEFT ANTECUBITAL  Final   Special Requests   Final    BOTTLES DRAWN AEROBIC AND ANAEROBIC Blood Culture results may not be optimal due to an inadequate volume of blood received in culture bottles   Culture   Final    NO GROWTH < 12 HOURS Performed at [facility], [place] [address] [place], [place] [address]    Report Status PENDING  Incomplete  Culture, blood (routine x 2)     Status: None (Preliminary result)   Collection Time: 12/11/18 10:35 AM   Specimen: BLOOD  Result Value Ref Range Status   Specimen Description BLOOD RIGHT ANTECUBITAL  Final   Special Requests   Final    BOTTLES DRAWN AEROBIC AND ANAEROBIC Blood Culture adequate volume   Culture   Final    NO GROWTH < 12 HOURS Performed at [facility], [place] [address], [place], [place] [address]    Report Status PENDING  Incomplete      Radiology Studies: No results found.    LOS: 4 days   Time spent: More than 50% of that time was spent in counseling and/or coordination of care.  Antonieta Pert, MD [facility]  12/12/2018, 1:13 PM

## 2018-12-12 NOTE — Consult Note (Signed)
Chief Complaint: Patient was seen in consultation today for Thoracic 8 Kyphoplasty Chief Complaint  Patient presents with   Back Pain   at the request of Dr Dillard Cannon  Supervising Physician: Luanne Bras  Patient Status: [facility] - In-pt  History of Present Illness: Donald Jenkins is a 48 y.o. male   No known injury Back pain started Mon night 1 weeks ago Worse over next few days Wed came to ED  MR 6/12: IMPRESSION: 1. Acute to early subacute compression fracture of T8 with up to 50% height loss and trace 2 mm bony retropulsion. No significant spinal stenosis or cord impingement. Finding would be amendable to vertebral augmentation. 2. Abnormal soft tissue edema involving the upper posterior paraspinous musculature bilaterally (rhomboid major musculature), suggesting muscular injury and/or strain. 3. Reactive marrow edema about the right T4-5 facet, compatible with active facet arthritis. Finding could contribute to underlying back pain. 4. Mild multilevel thoracic degenerative spondylitic changes without significant spinal stenosis. Mild to moderate right foraminal narrowing at T3-4 and T4-5, with mild left foraminal narrowing at T8-9.  Using pain meds every day Morphine in hospital-- constipation now + Rhabdo- improving; AKI- improving  Request for T8 KP Dr Estanislado Pandy has reviewed imaging and approves procedure  Past Medical History:  Diagnosis Date   Anxiety    Hypertension    Tachycardia     Past Surgical History:  Procedure Laterality Date   CHOLECYSTECTOMY N/A 06/05/2018   Procedure: LAPAROSCOPIC CHOLECYSTECTOMY WITH POSSIBLE INTRAOPERATIVE CHOLANGIOGRAM;  Surgeon: Clovis Riley, MD;  Location: [place];  Service: General;  Laterality: N/A;   KNEE SURGERY      Allergies: Penicillins  Medications: Prior to Admission medications   Medication Sig Start Date End Date Taking? Authorizing Provider  ALPRAZolam Duanne Moron) 1 MG tablet Take 1 tablet  (1 mg total) by mouth 3 (three) times daily as needed for anxiety. 11/01/18  Yes Delight Hoh, MD  ibuprofen (ADVIL) 200 MG tablet Take 800 mg by mouth every 6 (six) hours as needed for mild pain.   Yes [provider]  methylphenidate (RITALIN) 5 MG tablet Take 1 tablet (5 mg total) by mouth 3 (three) times daily with meals. 11/16/18  Yes Delight Hoh, MD  zaleplon (SONATA) 10 MG capsule Take 1 capsule (10 mg total) by mouth at bedtime as needed for sleep. Reported on 01/14/2016 11/01/18  Yes Delight Hoh, MD  busPIRone (BUSPAR) 15 MG tablet Take 1 tablet (15 mg total) by mouth 2 (two) times daily. Patient not taking: Reported on 12/08/2018 11/15/18   Delight Hoh, MD  lisinopril-hydrochlorothiazide (ZESTORETIC) 10-12.5 MG tablet Take 1 tablet by mouth daily. Patient not taking: Reported on 12/08/2018 10/31/18   Maximiano Coss, NP  montelukast (SINGULAIR) 10 MG tablet TAKE 1 TABLET BY MOUTH EVERYDAY AT BEDTIME Patient not taking: Reported on 12/08/2018 10/31/18   Maximiano Coss, NP     Family History  Problem Relation Age of Onset   Lung cancer Father    Esophageal cancer Father    Brain cancer Father     Social History   Socioeconomic History   Marital status: Single    Spouse name: Not on file   Number of children: 0   Years of education: Not on file   Highest education level: Not on file  Occupational History   Occupation: Programme researcher, broadcasting/film/video    Employer: [employer]   Financial resource strain: Not hard at all   Food insecurity  Worry: Never true    Inability: Never true   Transportation needs    Medical: No    Non-medical: No  Tobacco Use   Smoking status: Never Smoker   Smokeless tobacco: Never Used  Substance and Sexual Activity   Alcohol use: Yes    Alcohol/week: 0.0 standard drinks    Comment: socially, "once in a blue moon"   Drug use: Yes    Types: Marijuana   Sexual activity: Yes    Partners: Female    Birth  control/protection: None  Lifestyle   Physical activity    Days per week: 3 days    Minutes per session: 40 min   Stress: To some extent  Relationships   Social connections    Talks on phone: Once a week    Gets together: Once a week    Attends religious service: Never    Active member of club or organization: No    Attends meetings of clubs or organizations: Never    Relationship status: Never married  Other Topics Concern   Not on file  Social History Narrative   Programme researcher, 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  All other systems are negative.  Review of Systems  Constitutional: Positive for activity change, appetite change and fatigue. Negative for fever.  Respiratory: Negative for cough and shortness of breath.   Cardiovascular: Negative for chest pain.  Gastrointestinal: Positive for nausea.  Musculoskeletal: Positive for back pain and gait problem.  Neurological: Positive for weakness.  Psychiatric/Behavioral: Negative for behavioral problems and confusion.    Vital Signs: BP (!) 142/107 (BP Location: Right Arm)    Pulse 78    Temp 98.9 F (37.2 C) (Oral)    Resp 18    Ht 6\' 1"  (1.854 m)    Wt 179 lb 1.6 oz (81.2 kg) Comment: scale b   SpO2 100%    BMI 23.63 kg/m   Physical Exam Vitals signs reviewed.  Cardiovascular:     Rate and Rhythm: Normal rate and regular rhythm.     Heart sounds: Normal heart sounds.  Pulmonary:     Breath sounds: Normal breath sounds.  Abdominal:     Tenderness: There is no abdominal tenderness.  Musculoskeletal: Normal range of motion.     Comments: Painful mi to upper back Unable to get comfortable in bed  Skin:    General: Skin is warm and dry.  Neurological:     Mental Status: He is alert and oriented to person, place, and time.  Psychiatric:        Mood and Affect: Mood normal.        Behavior: Behavior normal.         Thought Content: Thought content normal.        Judgment: Judgment normal.     Imaging: Dg Thoracic Spine 2 View  Result Date: 12/08/2018 CLINICAL DATA:  48 year old male status post fall.  Pain. EXAM: THORACIC SPINE 2 VIEWS COMPARISON:  Cervical spine CT today reported separately. Chest radiographs 07/02/2013. FINDINGS: Normal thoracic segmentation. There is a moderate compression fracture of T8 which is new since 2015. At other levels thoracic vertebral height and alignment appears preserved. Cervicothoracic junction alignment is within normal limits. Preserved disc spaces. Posterior ribs appear intact. Negative visible chest and abdominal visceral contours. IMPRESSION: Moderate T8 compression fracture, new since 2015 and likely acute in  this clinical setting. If specific therapy is desired, Thoracic MRI without contrast or Nuclear Medicine Whole-body Bone Scan would confirm candidacy for vertebroplasty. Electronically Signed   By: Genevie Ann M.D.   On: 12/08/2018 02:10   Ct Head Wo Contrast  Result Date: 12/08/2018 CLINICAL DATA:  48 year old male status post fall. Altered mental status. EXAM: CT HEAD WITHOUT CONTRAST CT CERVICAL SPINE WITHOUT CONTRAST TECHNIQUE: Multidetector CT imaging of the head and cervical spine was performed following the standard protocol without intravenous contrast. Multiplanar CT image reconstructions of the cervical spine were also generated. COMPARISON:  None. FINDINGS: CT HEAD FINDINGS Brain: No midline shift, ventriculomegaly, mass effect, evidence of mass lesion, intracranial hemorrhage or evidence of cortically based acute infarction. Gray-white matter differentiation is within normal limits throughout the brain. Vascular: Mild Calcified atherosclerosis at the skull base. No suspicious intracranial vascular hyperdensity. Skull: Negative. Sinuses/Orbits: Visualized paranasal sinuses and mastoids are clear. Other: Visualized orbits and scalp soft tissues are within  normal limits. CT CERVICAL SPINE FINDINGS Alignment: Relatively preserved cervical lordosis. Levoconvex cervical spine curvature or scoliosis. Cervicothoracic junction alignment is within normal limits. Bilateral posterior element alignment is within normal limits. Skull base and vertebrae: Visualized skull base is intact. No atlanto-occipital dissociation. Motion artifact at C4 and C5, with repeat imaging through those levels. No cervical spine fracture identified. Benign appearing sclerosis of the left C4 facet. Normal bone mineralization otherwise. Soft tissues and spinal canal: No prevertebral fluid or swelling. No visible canal hematoma. Calcified carotid atherosclerosis greater on the left. Disc levels: Disc space loss with endplate spurring at J0-K9 and C6-C7. Upper chest: Negative visible upper thoracic levels and lung apices. IMPRESSION: 1. Normal noncontrast CT appearance of the brain. 2. No acute traumatic injury identified in the head or cervical spine. Electronically Signed   By: Genevie Ann M.D.   On: 12/08/2018 02:07   Ct Cervical Spine Wo Contrast  Result Date: 12/08/2018 CLINICAL DATA:  48 year old male status post fall. Altered mental status. EXAM: CT HEAD WITHOUT CONTRAST CT CERVICAL SPINE WITHOUT CONTRAST TECHNIQUE: Multidetector CT imaging of the head and cervical spine was performed following the standard protocol without intravenous contrast. Multiplanar CT image reconstructions of the cervical spine were also generated. COMPARISON:  None. FINDINGS: CT HEAD FINDINGS Brain: No midline shift, ventriculomegaly, mass effect, evidence of mass lesion, intracranial hemorrhage or evidence of cortically based acute infarction. Gray-white matter differentiation is within normal limits throughout the brain. Vascular: Mild Calcified atherosclerosis at the skull base. No suspicious intracranial vascular hyperdensity. Skull: Negative. Sinuses/Orbits: Visualized paranasal sinuses and mastoids are clear.  Other: Visualized orbits and scalp soft tissues are within normal limits. CT CERVICAL SPINE FINDINGS Alignment: Relatively preserved cervical lordosis. Levoconvex cervical spine curvature or scoliosis. Cervicothoracic junction alignment is within normal limits. Bilateral posterior element alignment is within normal limits. Skull base and vertebrae: Visualized skull base is intact. No atlanto-occipital dissociation. Motion artifact at C4 and C5, with repeat imaging through those levels. No cervical spine fracture identified. Benign appearing sclerosis of the left C4 facet. Normal bone mineralization otherwise. Soft tissues and spinal canal: No prevertebral fluid or swelling. No visible canal hematoma. Calcified carotid atherosclerosis greater on the left. Disc levels: Disc space loss with endplate spurring at F8-H8 and C6-C7. Upper chest: Negative visible upper thoracic levels and lung apices. IMPRESSION: 1. Normal noncontrast CT appearance of the brain. 2. No acute traumatic injury identified in the head or cervical spine. Electronically Signed   By: Genevie Ann M.D.   On:  12/08/2018 02:07   Mr Thoracic Spine Wo Contrast  Result Date: 12/09/2018 CLINICAL DATA:  Initial evaluation for acute mid to upper back pain no known injury. EXAM: MRI THORACIC SPINE WITHOUT CONTRAST TECHNIQUE: Multiplanar, multisequence MR imaging of the thoracic spine was performed. No intravenous contrast was administered. COMPARISON:  Prior radiograph from 12/08/2018. FINDINGS: Alignment: Mild dextroscoliosis. Alignment otherwise normal with preservation of the normal thoracic kyphosis. No listhesis or malalignment. Vertebrae: Acute to subacute appearing compression fracture of the T8 vertebral body with up to 50% central height loss with trace 2 mm bony retropulsion at the inferior posterior cortex. This is benign/mechanical in appearance. Mild height loss at the superior endplates of T4, T5, and T7 with associated Schmorl's node deformities  largely chronic in appearance. No other acute or subacute compression fracture. Vertebral body height otherwise maintained. Underlying bone marrow signal intensity within normal limits. Few scattered benign hemangiomas noted, most prominent of which measures 11 mm in the T10 vertebral body. No aggressive or worrisome osseous lesions. Reactive marrow edema present about the right T4-5 facet due to facet arthritis (series 17, image 6). No other abnormal marrow edema. Cord: Signal intensity within the thoracic spinal cord is normal. Normal cord caliber and morphology. Conus terminates inferior to T12. Paraspinal and other soft tissues: Soft tissue edema present within the upper posterior paraspinous musculature (series 17, images 1, 19), suggesting muscular injury/strain. Paraspinous soft tissues demonstrate no other acute finding. Visualized lungs are largely clear. Visualized visceral structures unremarkable. Disc levels: T1-2:  Unremarkable. T2-3: Unremarkable. T3-4: Mild height loss with endplate Schmorl's node at the superior endplate of T4. Right-sided facet degeneration. Resultant mild to moderate right foraminal narrowing. No spinal stenosis or left foraminal narrowing. T4-5: Height loss at the superior endplate of T5 with associated Schmorl's node deformity. Right greater than left facet degeneration. Mild right foraminal narrowing. No canal or left foraminal stenosis. T5-6: Mild right-sided facet degeneration. No significant stenosis. T6-7:  Minimal disc bulge.  No stenosis. T7-8:  Minimal disc bulge.  No stenosis. T8-9: Trace 2 mm bony retropulsion related to the T8 compression fracture. No significant stenosis or cord impingement. Mild left foraminal narrowing. T9-10: Mild facet hypertrophy.  No stenosis. T10-11:  Mild facet hypertrophy.  No stenosis. T11-12:  Unremarkable. T12-L1:  Unremarkable. IMPRESSION: 1. Acute to early subacute compression fracture of T8 with up to 50% height loss and trace 2 mm bony  retropulsion. No significant spinal stenosis or cord impingement. Finding would be amendable to vertebral augmentation. 2. Abnormal soft tissue edema involving the upper posterior paraspinous musculature bilaterally (rhomboid major musculature), suggesting muscular injury and/or strain. 3. Reactive marrow edema about the right T4-5 facet, compatible with active facet arthritis. Finding could contribute to underlying back pain. 4. Mild multilevel thoracic degenerative spondylitic changes without significant spinal stenosis. Mild to moderate right foraminal narrowing at T3-4 and T4-5, with mild left foraminal narrowing at T8-9. Electronically Signed   By: Jeannine Boga M.D.   On: 12/09/2018 03:53   Dg Chest Portable 1 View  Result Date: 12/07/2018 CLINICAL DATA:  Back pain EXAM: PORTABLE CHEST 1 VIEW COMPARISON:  Chest x-ray dated 02/12/2014. FINDINGS: The heart size and mediastinal contours are within normal limits. Both lungs are clear. The visualized skeletal structures are unremarkable. There are probable old healed anterior rib fractures on the right. IMPRESSION: No active disease. Electronically Signed   By: Constance Holster M.D.   On: 12/07/2018 22:05    Labs:  CBC: Recent Labs    12/07/18  1813 12/08/18 0349 12/09/18 0656 12/10/18 0452  WBC 23.5* 16.0* 7.2 6.2  HGB 17.1* 13.1 11.8* 12.6*  HCT 48.7 38.1* 33.2* 35.6*  PLT 293 214 188 190    COAGS: No results for input(s): INR, APTT in the last 8760 hours.  BMP: Recent Labs    12/09/18 0656 12/10/18 0452 12/11/18 0628 12/12/18 0503  NA 137 141 140 140  K 3.7 3.2* 3.9 3.0*  CL 108 104 103 101  CO2 21* 26 29 30   GLUCOSE 109* 95 107* 105*  BUN 21* 7 5* <5*  CALCIUM 8.4* 8.9 8.7* 8.7*  CREATININE 1.12 0.97 0.81 0.78  GFRNONAA >60 >60 >60 >60  GFRAA >60 >60 >60 >60    LIVER FUNCTION TESTS: Recent Labs    12/09/18 0656 12/10/18 0452 12/11/18 0628 12/12/18 0503  BILITOT 0.8 0.5 0.4 0.4  AST 629* 595* 421* 272*    ALT 206* 234* 201* 176*  ALKPHOS 37* 41 44 46  PROT 4.9* 5.6* 5.4* 5.2*  ALBUMIN 2.7* 3.0* 2.7* 2.6*    TUMOR MARKERS: No results for input(s): AFPTM, CEA, CA199, CHROMGRNA in the last 8760 hours.  Assessment and Plan:  T8 acute fracture Painful even with meds No known injury Plan for T8 KP in [employer] pre certification pending Intend for 6/16 Will check ua afeb Risks and benefits of Thoracic 8 kyphoplasty were discussed with the patient including, but not limited to education regarding the natural healing process of compression fractures without intervention, bleeding, infection, cement migration which may cause spinal cord damage, paralysis, pulmonary embolism or even death.  This interventional procedure involves the use of X-rays and because of the nature of the planned procedure, it is possible that we will have prolonged use of X-ray fluoroscopy.  Potential radiation risks to you include (but are not limited to) the following: - A slightly elevated risk for cancer  several years later in life. This risk is typically less than 0.5% percent. This risk is low in comparison to the normal incidence of human cancer, which is 33% for women and 50% for men according to the [employer]. - Radiation induced injury can include skin redness, resembling a rash, tissue breakdown / ulcers and hair loss (which can be temporary or permanent).   The likelihood of either of these occurring depends on the difficulty of the procedure and whether you are sensitive to radiation due to previous procedures, disease, or genetic conditions.   IF your procedure requires a prolonged use of radiation, you will be notified and given written instructions for further action.  It is your responsibility to monitor the irradiated area for the 2 weeks following the procedure and to notify your physician if you are concerned that you have suffered a radiation induced injury.    All of the  patient's questions were answered, patient is agreeable to proceed.  Consent signed and in chart.  Thank you for this interesting consult.  I greatly enjoyed meeting GIOVANNY DUGAL and look forward to participating in their care.  A copy of this report was sent to the requesting provider on this date.  Electronically Signed: Lavonia Drafts, PA-C 12/12/2018, 1:21 PM   I spent a total of 40 Minutes    in face to face in clinical consultation, greater than 50% of which was counseling/coordinating care for T8 KP

## 2018-12-13 ENCOUNTER — Other Ambulatory Visit: Payer: Self-pay | Admitting: Cardiology

## 2018-12-13 ENCOUNTER — Encounter (HOSPITAL_COMMUNITY): Payer: Self-pay | Admitting: Physician Assistant

## 2018-12-13 ENCOUNTER — Inpatient Hospital Stay (HOSPITAL_COMMUNITY): Payer: BC Managed Care – PPO

## 2018-12-13 DIAGNOSIS — R55 Syncope and collapse: Secondary | ICD-10-CM

## 2018-12-13 HISTORY — PX: IR VERTEBROPLASTY CERV/THOR BX INC UNI/BIL INC/INJECT/IMAGING: IMG5515

## 2018-12-13 LAB — CULTURE, BLOOD (ROUTINE X 2): Culture: NO GROWTH

## 2018-12-13 LAB — COMPREHENSIVE METABOLIC PANEL
ALT: 156 U/L — ABNORMAL HIGH (ref 0–44)
AST: 175 U/L — ABNORMAL HIGH (ref 15–41)
Albumin: 2.6 g/dL — ABNORMAL LOW (ref 3.5–5.0)
Alkaline Phosphatase: 44 U/L (ref 38–126)
Anion gap: 7 (ref 5–15)
BUN: 6 mg/dL (ref 6–20)
CO2: 29 mmol/L (ref 22–32)
Calcium: 8.7 mg/dL — ABNORMAL LOW (ref 8.9–10.3)
Chloride: 105 mmol/L (ref 98–111)
Creatinine, Ser: 0.76 mg/dL (ref 0.61–1.24)
GFR calc Af Amer: >60 mL/min (ref >60)
GFR calc non Af Amer: >60 mL/min (ref >60)
Glucose, Bld: 100 mg/dL — ABNORMAL HIGH (ref 70–99)
Potassium: 3.7 mmol/L (ref 3.5–5.1)
Sodium: 141 mmol/L (ref 135–145)
Total Bilirubin: 0.5 mg/dL (ref 0.3–1.2)
Total Protein: 5.1 g/dL — ABNORMAL LOW (ref 6.5–8.1)

## 2018-12-13 LAB — PROTIME-INR
INR: 1 (ref 0.8–1.2)
Prothrombin Time: 13.3 seconds (ref 11.4–15.2)

## 2018-12-13 LAB — CK: Total CK: 2423 U/L — ABNORMAL HIGH (ref 49–397)

## 2018-12-13 LAB — MAGNESIUM: Magnesium: 1.4 mg/dL — ABNORMAL LOW (ref 1.7–2.4)

## 2018-12-13 MED ORDER — VANCOMYCIN HCL IN DEXTROSE 750-5 MG/150ML-% IV SOLN
INTRAVENOUS | Status: AC | PRN
  Administered 2018-12-13: 1000 mg via INTRAVENOUS

## 2018-12-13 MED ORDER — FENTANYL CITRATE (PF) 100 MCG/2ML IJ SOLN
INTRAMUSCULAR | Status: AC | PRN
  Administered 2018-12-13 (×3): 25 ug via INTRAVENOUS

## 2018-12-13 MED ORDER — MIDAZOLAM HCL 2 MG/2ML IJ SOLN
INTRAMUSCULAR | Status: AC
  Filled 2018-12-13: qty 2

## 2018-12-13 MED ORDER — MAGNESIUM SULFATE 2 GM/50ML IV SOLN
2.0000 g | Freq: Once | INTRAVENOUS | Status: AC
  Administered 2018-12-13: 2 g via INTRAVENOUS
  Filled 2018-12-13: qty 50

## 2018-12-13 MED ORDER — BUPIVACAINE HCL (PF) 0.5 % IJ SOLN
INTRAMUSCULAR | Status: AC | PRN
  Administered 2018-12-13: 20 mL

## 2018-12-13 MED ORDER — BUPIVACAINE HCL (PF) 0.5 % IJ SOLN
INTRAMUSCULAR | Status: AC
  Filled 2018-12-13: qty 30

## 2018-12-13 MED ORDER — TOBRAMYCIN SULFATE 1.2 G IJ SOLR
INTRAMUSCULAR | Status: AC
  Filled 2018-12-13: qty 1.2

## 2018-12-13 MED ORDER — SODIUM CHLORIDE 0.9 % IV SOLN
INTRAVENOUS | Status: AC | PRN
  Administered 2018-12-13: 10 mL/h via INTRAVENOUS

## 2018-12-13 MED ORDER — FENTANYL CITRATE (PF) 100 MCG/2ML IJ SOLN
INTRAMUSCULAR | Status: AC
  Filled 2018-12-13: qty 2

## 2018-12-13 MED ORDER — VANCOMYCIN HCL IN DEXTROSE 1-5 GM/200ML-% IV SOLN
INTRAVENOUS | Status: AC
  Filled 2018-12-13: qty 200

## 2018-12-13 MED ORDER — LIDOCAINE HCL 1 % IJ SOLN
INTRAMUSCULAR | Status: AC
  Filled 2018-12-13: qty 20

## 2018-12-13 MED ORDER — MIDAZOLAM HCL 2 MG/2ML IJ SOLN
INTRAMUSCULAR | Status: AC | PRN
  Administered 2018-12-13 (×2): 1 mg via INTRAVENOUS

## 2018-12-13 MED ORDER — SODIUM CHLORIDE 0.9 % IV SOLN
INTRAVENOUS | Status: DC
  Administered 2018-12-13 – 2018-12-14 (×2): via INTRAVENOUS

## 2018-12-13 MED ORDER — IOHEXOL 300 MG/ML  SOLN
50.0000 mL | Freq: Once | INTRAMUSCULAR | Status: AC | PRN
  Administered 2018-12-13: 3 mL

## 2018-12-13 NOTE — Progress Notes (Signed)
PROGRESS NOTE    Donald Jenkins  GDJ:[id number] DOB: 1970-09-18 DOA: 12/07/2018 PCP: Patient, No Pcp Per    Brief Narrative:  48 year old male with history of hypertension, anxiety, hypersomnic disorder who was admitted after being found on the floor of his home, reported increased back pain.  In the ER significant rhabdomyolysis for more than 50,000 CK and T8 compression fracture.  Patient reports he has had  back pain and gets flareups intermittently, worse for 1 week. In ER, CK >50,000,alcohol less than 10, pending COVID-19 test, UDS positive for THC,troponin 0.05, lactic acid 1.5, WBC 23.5, urinalysis negative, Tylenol less than 10, salicylate level less than 7, abnormal liver function (ALP 68, AST 684, ALT 140, total bilirubin 1.2), AKI with creatinine 2.80, BUN 55), chest x-ray negative. Patient is placed on telemetry bed for observation. Negative covid 19 test. Patient was admitted, treated with aggressive IV hydration, was placed on antibiotics blood culture came back positive [facility] suspected to be contamination. His rhabdomyolysis acute renal failure and mild LFT significantly improved.  Subjective: Patient still complains of back pain awaiting for vertebroplasty  Severe Rhabdomyolysis CK more than 50,000.  Suspect due to prolonged immobilization.  Improved to 2400 range.  Will  Dextrose ivf , cont NSS. Encourage oral hydration. CK in am. Recent Labs  Lab 12/07/18 1813 12/08/18 0108 12/08/18 0349 12/08/18 1142 12/09/18 0656 12/10/18 0452 12/11/18 0628 12/12/18 0503 12/13/18 0541  CKTOTAL >50,000*  --  >50,000*  --  45,248* 32,170* 14,149* 7,363* 2,423*  TROPONINI 0.05* 0.05* 0.06* 0.04*  --   --   --   --   --    Strep bacteremia on 12/08/18/SIRS. Informed by Junie Panning from ID, recommend stopping antibiotics as strep in only 1 out of 3 blood culture likely contamination, no obvious infectious source identified.  His blood culture Repeat 6/14 NGTD  Acute on chronic  back pain with imaging showing acute T8 compression fracture: her reports he "fell from his couch and landed hard on the floor on his back". patient continues to need IV morphine for pain control, IR has been consulted FOR vertebroplasty and BIOPSY today.  Post kyphoplasty patient needs to be instructed not to lift more than 10 pound weight for 2 weeks and not to drive for 2 weeks.  Essential hypertension, ON LISINOPRIL/HCTZ- meds remains on hold.  Blood pressure stable-slowly uptrending.  Hypersomnolence disorder, with mental disorder, acute, moderate /Depression with anxiety: Mood is stable.  Continue home meds.  Abnormal LFTs: Suspect due to rhabdomyolysis, improved.  Hepatitis panel negative.  AKI creatinine 2.8 on admission.  Likely due to rhabdomyolysis.  Now improved.  Elevated troponin mild 05- 0.06-0.04: Without chest pain, likely in the setting of rhabdomyolysis.  Acute metabolic encephalopathy: Likely multifactorial, CT head and C-spine no acute intracranial finding.  Patient has been not taking any Xanax for the last 2 weeks and was started on vitamin a month ago, has been on buspirone for a long time.  At this time overall mental status seems to have improved.  Still has some poor memory and appears somewhat forgetful-stable baseline.  At this time advised outpatient follow-up.Referred to outpatient cardiac monitor for 30 days. message was sent to cardiology to review by Dr Erlinda Hong.  Metabolic acidosis: Resolved.  Bicarb improved   Hypoglycemia:  Blood sugar has been in 100, I will stop his dextrose ivf- encourage oral intake and monitor blood sugar overnight. Insulin level C-peptide is unremarkable.  SFU in process.  Hypokalemia/hypomagnesemia: k stable, mag lw and  will order 2 gm mag rider and cont po magox  COVID19 screening negative  DVT prophylaxis: SCD. Code Status: full code Family Communication: Plan of care discussed with patient and is in agreement.   Disposition  Plan: Anticipate discharge in next 24 hours pending further improvement in his CK and if no more hypoglycemia.    Consultants:  IR  Procedures: Image-guided T8 VP on 6/16 with biopsy   Antibiotics:  Vanc/cefepime x1 on admission  Rocephin  Stopped 6/15  Antimicrobials: Anti-infectives (From admission, onward)   Start     Dose/Rate Route Frequency Ordered Stop   12/13/18 1146  tobramycin (NEBCIN) 1.2 g powder    Note to Pharmacy: France Ravens   : cabinet override      12/13/18 1146 12/13/18 2359   12/13/18 1132  vancomycin (VANCOCIN) IVPB 750 mg/150 ml premix     over 60 Minutes  Continuous PRN 12/13/18 1132 12/13/18 1132   12/13/18 1117  vancomycin (VANCOCIN) 1-5 GM/200ML-% IVPB    Note to Pharmacy: Gar Ponto   : cabinet override      12/13/18 1117 12/13/18 2329   12/13/18 0000  vancomycin (VANCOCIN) IVPB 1000 mg/200 mL premix     1,000 mg 200 mL/hr over 60 Minutes Intravenous To Radiology 12/12/18 1259 12/14/18 0000   12/09/18 0900  cefTRIAXone (ROCEPHIN) 2 g in sodium chloride 0.9 % 100 mL IVPB  Status:  Discontinued     2 g 200 mL/hr over 30 Minutes Intravenous Every 24 hours 12/09/18 0850 12/12/18 1313   12/08/18 1000  ceFEPIme (MAXIPIME) 2 g in sodium chloride 0.9 % 100 mL IVPB  Status:  Discontinued     2 g 200 mL/hr over 30 Minutes Intravenous Every 12 hours 12/08/18 0057 12/08/18 1341   12/08/18 0100  vancomycin (VANCOCIN) 1,500 mg in sodium chloride 0.9 % 500 mL IVPB     1,500 mg 250 mL/hr over 120 Minutes Intravenous  Once 12/08/18 0052 12/08/18 0501   12/08/18 0100  ceFEPIme (MAXIPIME) 2 g in sodium chloride 0.9 % 100 mL IVPB     2 g 200 mL/hr over 30 Minutes Intravenous  Once 12/08/18 0052 12/08/18 0216   12/08/18 0057  vancomycin variable dose per unstable renal function (pharmacist dosing)  Status:  Discontinued      Does not apply See admin instructions 12/08/18 0057 12/08/18 1341       Objective: Vitals:   12/13/18 1200 12/13/18 1205 12/13/18  1210 12/13/18 1314  BP: (!) 142/91 (!) 146/80 (!) 166/87 (!) 152/85  Pulse: 77 70 84 73  Resp: 14 14 17 18   Temp:    (!) 97.5 F (36.4 C)  TempSrc:    Oral  SpO2: 99% 99% 100% 94%  Weight:      Height:        Intake/Output Summary (Last 24 hours) at 12/13/2018 1650 Last data filed at 12/13/2018 1500 Gross per 24 hour  Intake 2073.89 ml  Output 1750 ml  Net 323.89 ml   Filed Weights   12/11/18 0421 12/12/18 0331 12/13/18 0620  Weight: 83 kg 81.2 kg 84.2 kg   Weight change: 2.961 kg  Body mass index is 24.49 kg/m.  Intake/Output from previous day: 06/15 0701 - 06/16 0700 In: 2073.9 [P.O.:360; I.V.:1713.9] Out: 2450 [Urine:2450] Intake/Output this shift: Total I/O In: -  Out: 600 [Urine:600]  Examination:  General exam: Appears calm and comfortable, not in acute distress, somewhat forgertful HEENT:PERRL,Oral mucosa moist, Ear/Nose normal on gross exam Respiratory system: Bilateral  equal air entry, normal vesicular breath sounds, no wheezes or crackles  Cardiovascular system: S1 & S2 heard,No JVD, murmurs. Gastrointestinal system: Abdomen is  soft, non tender, non distended, BS +  Nervous System:Alert and oriented. No focal neurological deficits/moving extremities, sensation intact. Extremities: No edema, no clubbing, distal peripheral pulses palpable. Skin: No rashes, lesions, no icterus MSK: Normal muscle bulk,tone ,power Tenderness on the mid back  Medications:  Scheduled Meds: . aspirin EC  81 mg Oral Daily  . bupivacaine      . busPIRone  15 mg Oral BID  . fentaNYL      . magnesium oxide  400 mg Oral BID  . methylphenidate  5 mg Oral TID WC  . midazolam      . senna-docusate  1 tablet Oral BID  . tobramycin       Continuous Infusions: . sodium chloride 1,000 mL (12/11/18 0949)  . magnesium sulfate bolus IVPB 2 g (12/13/18 1633)  . vancomycin    . vancomycin      Data Reviewed: I have personally reviewed following labs and imaging studies  CBC:  Recent Labs  Lab 12/07/18 1813 12/08/18 0349 12/09/18 0656 12/10/18 0452  WBC 23.5* 16.0* 7.2 6.2  NEUTROABS 20.8*  --   --   --   HGB 17.1* 13.1 11.8* 12.6*  HCT 48.7 38.1* 33.2* 35.6*  MCV 89.0 91.6 88.1 88.6  PLT 293 214 188 638   Basic Metabolic Panel: Recent Labs  Lab 12/08/18 0349 12/09/18 0656 12/10/18 0452 12/11/18 0628 12/12/18 0503 12/13/18 0541  NA 136 137 141 140 140 141  K 4.1 3.7 3.2* 3.9 3.0* 3.7  CL 108 108 104 103 101 105  CO2 14* 21* 26 29 30 29   GLUCOSE 90 109* 95 107* 105* 100*  BUN 47* 21* 7 5* <5* 6  CREATININE 2.04* 1.12 0.97 0.81 0.78 0.76  CALCIUM 8.1* 8.4* 8.9 8.7* 8.7* 8.7*  MG 2.6*  --   --  1.2* 1.3* 1.4*   GFR: Estimated Creatinine Clearance: 129 mL/min (by C-G formula based on SCr of 0.76 mg/dL). Liver Function Tests: Recent Labs  Lab 12/09/18 0656 12/10/18 0452 12/11/18 0628 12/12/18 0503 12/13/18 0541  AST 629* 595* 421* 272* 175*  ALT 206* 234* 201* 176* 156*  ALKPHOS 37* 41 44 46 44  BILITOT 0.8 0.5 0.4 0.4 0.5  PROT 4.9* 5.6* 5.4* 5.2* 5.1*  ALBUMIN 2.7* 3.0* 2.7* 2.6* 2.6*   No results for input(s): LIPASE, AMYLASE in the last 168 hours. No results for input(s): AMMONIA in the last 168 hours. Coagulation Profile: Recent Labs  Lab 12/13/18 0846  INR 1.0   Cardiac Enzymes: Recent Labs  Lab 12/07/18 1813 12/08/18 0108 12/08/18 0349 12/08/18 1142 12/09/18 0656 12/10/18 0452 12/11/18 0628 12/12/18 0503 12/13/18 0541  CKTOTAL >50,000*  --  >50,000*  --  45,248* 32,170* 14,149* 7,363* 2,423*  TROPONINI 0.05* 0.05* 0.06* 0.04*  --   --   --   --   --    BNP (last 3 results) No results for input(s): PROBNP in the last 8760 hours. HbA1C: No results for input(s): HGBA1C in the last 72 hours. CBG: Recent Labs  Lab 12/11/18 0048 12/11/18 0416 12/11/18 1300 12/11/18 1637 12/11/18 2037  GLUCAP 143* 89 101* 117* 95   Lipid Profile: No results for input(s): CHOL, HDL, LDLCALC, TRIG, CHOLHDL, LDLDIRECT in the  last 72 hours. Thyroid Function Tests: Recent Labs    12/11/18 0628  TSH 1.538   Anemia  Panel: No results for input(s): VITAMINB12, FOLATE, FERRITIN, TIBC, IRON, RETICCTPCT in the last 72 hours. Sepsis Labs: Recent Labs  Lab 12/07/18 1940 12/08/18 0108  PROCALCITON  --  0.37  LATICACIDVEN 1.5 1.3    Recent Results (from the past 240 hour(s))  SARS Coronavirus 2 (CEPHEID - Performed in [facility] lab), Hosp Order     Status: None   Collection Time: 12/07/18 10:36 PM   Specimen: Nasopharyngeal Swab  Result Value Ref Range Status   SARS Coronavirus 2 NEGATIVE NEGATIVE Final    Comment: (NOTE) If result is NEGATIVE SARS-CoV-2 target nucleic acids are NOT DETECTED. The SARS-CoV-2 RNA is generally detectable in upper and lower  respiratory specimens during the acute phase of infection. The lowest  concentration of SARS-CoV-2 viral copies this assay can detect is 250  copies / mL. A negative result does not preclude SARS-CoV-2 infection  and should not be used as the sole basis for treatment or other  patient management decisions.  A negative result may occur with  improper specimen collection / handling, submission of specimen other  than nasopharyngeal swab, presence of viral mutation(s) within the  areas targeted by this assay, and inadequate number of viral copies  (<250 copies / mL). A negative result must be combined with clinical  observations, patient history, and epidemiological information. If result is POSITIVE SARS-CoV-2 target nucleic acids are DETECTED. The SARS-CoV-2 RNA is generally detectable in upper and lower  respiratory specimens dur ing the acute phase of infection.  Positive  results are indicative of active infection with SARS-CoV-2.  Clinical  correlation with patient history and other diagnostic information is  necessary to determine patient infection status.  Positive results do  not rule out bacterial infection or co-infection with other  viruses. If result is PRESUMPTIVE POSTIVE SARS-CoV-2 nucleic acids MAY BE PRESENT.   A presumptive positive result was obtained on the submitted specimen  and confirmed on repeat testing.  While 2019 novel coronavirus  (SARS-CoV-2) nucleic acids may be present in the submitted sample  additional confirmatory testing may be necessary for epidemiological  and / or clinical management purposes  to differentiate between  SARS-CoV-2 and other Sarbecovirus currently known to infect humans.  If clinically indicated additional testing with an alternate test  methodology [phone]) is advised. The SARS-CoV-2 RNA is generally  detectable in upper and lower respiratory sp ecimens during the acute  phase of infection. The expected result is Negative. Fact Sheet for Patients:  StrictlyIdeas.no Fact Sheet for Healthcare Providers: BankingDealers.co.za This test is not yet approved or cleared by the [place] FDA and has been authorized for detection and/or diagnosis of SARS-CoV-2 by FDA under an Emergency Use Authorization (EUA).  This EUA will remain in effect (meaning this test can be used) for the duration of the COVID-19 declaration under Section 564(b)(1) of the Act, 21 U.S.C. section 360bbb-3(b)(1), unless the authorization is terminated or revoked sooner. Performed at [facility], [place] [address] [place], [place] [address]   Urine Culture     Status: None   Collection Time: 12/08/18 12:52 AM   Specimen: Urine, Random  Result Value Ref Range Status   Specimen Description URINE, RANDOM  Final   Special Requests NONE  Final   Culture   Final    NO GROWTH Performed at [facility], [place] [address] [place], [place] [address]    Report Status 12/09/2018 FINAL  Final  Culture, blood (x 2)  Status: None   Collection Time: 12/08/18  1:08 AM   Specimen: BLOOD  Result Value Ref Range Status   Specimen Description BLOOD LEFT ARM   Final   Special Requests   Final    BOTTLES DRAWN AEROBIC ONLY Blood Culture results may not be optimal due to an inadequate volume of blood received in culture bottles   Culture   Final    NO GROWTH 5 DAYS Performed at [facility], [address] [place], [place] [address]    Report Status 12/13/2018 FINAL  Final  Culture, blood (x 2)     Status: Abnormal   Collection Time: 12/08/18  1:08 AM   Specimen: BLOOD  Result Value Ref Range Status   Specimen Description BLOOD RIGHT ARM  Final   Special Requests   Final    BOTTLES DRAWN AEROBIC AND ANAEROBIC Blood Culture adequate volume   Culture  Setup Time   Final    GRAM POSITIVE COCCI IN CHAINS AEROBIC BOTTLE ONLY CRITICAL RESULT CALLED TO, READ BACK BY AND VERIFIED WITH: PHRMD V BRYK @0052  12/09/18 BY S GEZAHEGN    Culture (A)  Final    VIRIDANS STREPTOCOCCUS THE SIGNIFICANCE OF ISOLATING THIS ORGANISM FROM A SINGLE SET OF BLOOD CULTURES WHEN MULTIPLE SETS ARE DRAWN IS UNCERTAIN. PLEASE NOTIFY THE MICROBIOLOGY DEPARTMENT WITHIN ONE WEEK IF SPECIATION AND SENSITIVITIES ARE REQUIRED. Performed at [facility], [place] [address], [place], [place] [address]    Report Status 12/11/2018 FINAL  Final  Blood Culture ID Panel (Reflexed)     Status: Abnormal   Collection Time: 12/08/18  1:08 AM  Result Value Ref Range Status   Enterococcus species NOT DETECTED NOT DETECTED Final   Listeria monocytogenes NOT DETECTED NOT DETECTED Final   Staphylococcus species NOT DETECTED NOT DETECTED Final   Staphylococcus aureus (BCID) NOT DETECTED NOT DETECTED Final   Streptococcus species DETECTED (A) NOT DETECTED Final    Comment: Not Enterococcus species, Streptococcus agalactiae, Streptococcus pyogenes, or Streptococcus pneumoniae. CRITICAL RESULT CALLED TO, READ BACK BY AND VERIFIED WITH: PHRMD V BRYK @0052  12/09/18 BY S GEZAHEGN    Streptococcus agalactiae NOT DETECTED NOT DETECTED Final   Streptococcus pneumoniae NOT DETECTED NOT  DETECTED Final   Streptococcus pyogenes NOT DETECTED NOT DETECTED Final   Acinetobacter baumannii NOT DETECTED NOT DETECTED Final   Enterobacteriaceae species NOT DETECTED NOT DETECTED Final   Enterobacter cloacae complex NOT DETECTED NOT DETECTED Final   Escherichia coli NOT DETECTED NOT DETECTED Final   Klebsiella oxytoca NOT DETECTED NOT DETECTED Final   Klebsiella pneumoniae NOT DETECTED NOT DETECTED Final   Proteus species NOT DETECTED NOT DETECTED Final   Serratia marcescens NOT DETECTED NOT DETECTED Final   Haemophilus influenzae NOT DETECTED NOT DETECTED Final   Neisseria meningitidis NOT DETECTED NOT DETECTED Final   Pseudomonas aeruginosa NOT DETECTED NOT DETECTED Final   Candida albicans NOT DETECTED NOT DETECTED Final   Candida glabrata NOT DETECTED NOT DETECTED Final   Candida krusei NOT DETECTED NOT DETECTED Final   Candida parapsilosis NOT DETECTED NOT DETECTED Final   Candida tropicalis NOT DETECTED NOT DETECTED Final    Comment: Performed at [facility], [place] [address], [place], [place] [address]  Culture, blood (routine x 2)     Status: None (Preliminary result)   Collection Time: 12/11/18 10:34 AM   Specimen: BLOOD  Result Value Ref Range Status   Specimen Description BLOOD LEFT ANTECUBITAL  Final   Special Requests  Final    BOTTLES DRAWN AEROBIC AND ANAEROBIC Blood Culture results may not be optimal due to an inadequate volume of blood received in culture bottles   Culture   Final    NO GROWTH 2 DAYS Performed at [facility], [place] [address] [place], [place] [address]    Report Status PENDING  Incomplete  Culture, blood (routine x 2)     Status: None (Preliminary result)   Collection Time: 12/11/18 10:35 AM   Specimen: BLOOD  Result Value Ref Range Status   Specimen Description BLOOD RIGHT ANTECUBITAL  Final   Special Requests   Final    BOTTLES DRAWN AEROBIC AND ANAEROBIC Blood Culture adequate volume   Culture   Final    NO GROWTH 2 DAYS  Performed at [facility], [place] [address] [place], [place] [address]    Report Status PENDING  Incomplete      Radiology Studies: No results found.   LOS: 5 days   Time spent: More than 50% of that time was spent in counseling and/or coordination of care.  Antonieta Pert, MD [facility]  12/13/2018, 4:50 PM

## 2018-12-13 NOTE — Plan of Care (Signed)
  Problem: Safety: Goal: Ability to remain free from injury will improve Outcome: Progressing   Problem: Activity: Goal: Capacity to carry out activities will improve Outcome: Progressing   Problem: Skin Integrity: Goal: Risk for impaired skin integrity will decrease Outcome: Completed/Met

## 2018-12-13 NOTE — Progress Notes (Signed)
Physical Therapy Treatment Patient Details Name: Donald Jenkins MRN: [id number] DOB: Sep 04, 1970 Today's Date: 12/13/2018    History of Present Illness Pt is a 48 y/o male admitted after being found on the floor of his home. Reporting increased back pain. PT with rhabdomyolysis and T8 compression fx. kyphoplasty planned 6/16.  PMH includes HTN, anxiety, and hypersolomnence disorder.    PT Comments    Pt pleasant in bed and reports awaiting kyphoplasty and exhibiting decreased pain and discomfort this session. Pt unable to recall back precautions from handout provided and required education for sequence particularly to prevent twisting with handout again reviewed. Pt performing standing and gait independently and is safe for return home. Pt would benefit from education to achieve independence post kyphoplasty.     Follow Up Recommendations  No PT follow up     Equipment Recommendations  None recommended by PT    Recommendations for Other Services       Precautions / Restrictions Precautions Precautions: Back Precaution Comments: reviewed back precautions to help with pain management.     Mobility  Bed Mobility Overal bed mobility: Needs Assistance Bed Mobility: Rolling;Sidelying to Sit;Sit to Sidelying Rolling: Supervision Sidelying to sit: Supervision     Sit to sidelying: Supervision General bed mobility comments: supervision with cues for technique to prevent twisting  Transfers Overall transfer level: Independent                  Ambulation/Gait Ambulation/Gait assistance: Independent Gait Distance (Feet): 700 Feet Assistive device: None Gait Pattern/deviations: WFL(Within Functional Limits)   Gait velocity interpretation: >4.37 ft/sec, indicative of normal walking speed General Gait Details: pt with steady normal gait and report of only 4/10 pain with movement this session with increased ambulation distance   Stairs             Wheelchair  Mobility    Modified Rankin (Stroke Patients Only)       Balance Overall balance assessment: Independent           Standing balance-Leahy Scale: Normal                              Cognition Arousal/Alertness: Awake/alert Behavior During Therapy: WFL for tasks assessed/performed Overall Cognitive Status: Within Functional Limits for tasks assessed                                        Exercises      General Comments        Pertinent Vitals/Pain Faces Pain Scale: Hurts little more Pain Location: back with movement Pain Descriptors / Indicators: Aching Pain Intervention(s): Limited activity within patient's tolerance;Repositioned;Monitored during session    Home Living Family/patient expects to be discharged to:: Private residence Living Arrangements: Alone                  Prior Function            PT Goals (current goals can now be found in the care plan section) Progress towards PT goals: Progressing toward goals    Frequency           PT Plan Discharge plan needs to be updated    Co-evaluation              AM-PAC PT "6 Clicks" Mobility   Outcome Measure  Help needed turning from  your back to your side while in a flat bed without using bedrails?: A Little Help needed moving from lying on your back to sitting on the side of a flat bed without using bedrails?: A Little Help needed moving to and from a bed to a chair (including a wheelchair)?: None Help needed standing up from a chair using your arms (e.g., wheelchair or bedside chair)?: None Help needed to walk in hospital room?: None Help needed climbing 3-5 steps with a railing? : None 6 Click Score: 22    End of Session   Activity Tolerance: Patient tolerated treatment well Patient left: in bed;with call bell/phone within reach Nurse Communication: Mobility status PT Visit Diagnosis: Pain;Other abnormalities of gait and mobility (R26.89)      Time: 5449-2010 PT Time Calculation (min) (ACUTE ONLY): 16 min  Charges:  $Gait Training: 8-22 mins                     [place], PT Acute Rehabilitation Services Pager: [phone] Office: [place] 12/13/2018, 12:43 PM

## 2018-12-13 NOTE — Procedures (Signed)
S/P T 8 VP with biopsy

## 2018-12-14 LAB — COMPREHENSIVE METABOLIC PANEL
ALT: 179 U/L — ABNORMAL HIGH (ref 0–44)
AST: 162 U/L — ABNORMAL HIGH (ref 15–41)
Albumin: 3 g/dL — ABNORMAL LOW (ref 3.5–5.0)
Alkaline Phosphatase: 56 U/L (ref 38–126)
Anion gap: 12 (ref 5–15)
BUN: 8 mg/dL (ref 6–20)
CO2: 24 mmol/L (ref 22–32)
Calcium: 9.1 mg/dL (ref 8.9–10.3)
Chloride: 101 mmol/L (ref 98–111)
Creatinine, Ser: 0.71 mg/dL (ref 0.61–1.24)
GFR calc Af Amer: >60 mL/min (ref >60)
GFR calc non Af Amer: >60 mL/min (ref >60)
Glucose, Bld: 92 mg/dL (ref 70–99)
Potassium: 3.5 mmol/L (ref 3.5–5.1)
Sodium: 137 mmol/L (ref 135–145)
Total Bilirubin: 0.3 mg/dL (ref 0.3–1.2)
Total Protein: 5.8 g/dL — ABNORMAL LOW (ref 6.5–8.1)

## 2018-12-14 LAB — MAGNESIUM: Magnesium: 1.6 mg/dL — ABNORMAL LOW (ref 1.7–2.4)

## 2018-12-14 LAB — CK: Total CK: 1217 U/L — ABNORMAL HIGH (ref 49–397)

## 2018-12-14 MED ORDER — AMLODIPINE BESYLATE 2.5 MG PO TABS
5.0000 mg | ORAL_TABLET | Freq: Every day | ORAL | Status: DC
  Administered 2018-12-14 – 2018-12-15 (×2): 5 mg via ORAL
  Filled 2018-12-14 (×2): qty 2

## 2018-12-14 NOTE — Progress Notes (Signed)
PROGRESS NOTE  Donald Jenkins JOA:[id number] DOB: 10/12/1970 DOA: 12/07/2018 PCP: Patient, No Pcp Per  HPI/Recap of past 68 hours: 48 year old male with history of hypertension, anxiety,hypersomnicdisorder who was admitted after being found on the floor of his home, reported increased back pain. In the ER significant rhabdomyolysis for more than 50,000 CK and T8 compression fracture. Patient reports he has had back pain and gets flareups intermittently, worsefor 1 week. In ER,CK >50,000,alcohol less than 10, pending COVID-19 test, UDS positive for THC,troponin 0.05, lactic acid 1.5, WBC 23.5, urinalysis negative, Tylenol less than 10, salicylate level less than 7, abnormal liver function (ALP 68, AST 684, ALT 140, total bilirubin 1.2), AKI with creatinine 2.80, BUN 55), chest x-ray negative. Patient is placed on telemetry bed for observation. Negative covid 19 test. Patient was admitted, treated with aggressive IV hydration, was placed on antibiotics blood culture came back positive [place] suspected to be contamination. His rhabdomyolysis acute renal failure and mild LFT significantly improved.  12/14/18: Patient seen and examined this bedside..  Pain in his upper back 6 out of 10 improved from 10 out of 10 prior to T8 kyphoplasty.  Assessment/Plan: Principal Problem:   Rhabdomyolysis Active Problems:   Essential hypertension, benign   Hypersomnolence disorder, with mental disorder, acute, moderate   SIRS (systemic inflammatory response syndrome) (HCC)   Back pain   Depression with anxiety   Abnormal LFTs   AKI (acute kidney injury) ([place])   Elevated troponin   Acute metabolic encephalopathy   Metabolic acidosis   Compression fracture of body of thoracic vertebra (HCC)   Hypoglycemia   Bacteremia   Fall   Hypomagnesemia  SevereRhabdomyolysis CK more than 50,000.Suspect due to prolonged immobilization. CPK continues to improve down to 1200 range.  Continue to  encourage oral fluid intake.   Acute on chronic back pain with imaging showing acute T8 compression fracture status post kyphoplasty by interventional radiology Back pain has significantly improved from 10 out of 10 to 6 out of 10 today. Instructed in the room not to lift more than 10 pounds x 2 weeks and not to drive x2 weeks.  Patient understands and agrees to plan.                Recent Labs  Lab 12/07/18 1813 12/08/18 0108 12/08/18 0349 12/08/18 1142 12/09/18 0656 12/10/18 0452 12/11/18 0628 12/12/18 0503 12/13/18 0541  CKTOTAL >50,000*  --  >50,000*  --  45,248* 32,170* 14,149* 7,363* 2,423*  TROPONINI 0.05* 0.05* 0.06* 0.04*  --   --   --   --   --    Strepbacteremia on 12/08/18/SIRS.Informed by Junie Panning from ID, recommend stopping antibiotics asstrep inonly1outof 3 blood culture likely contamination, no obvious infectious source identified. His blood culture Repeat 6/14 NGTD  Essential hypertension,BP is normotensive. C/w holding LISINOPRIL/HCTZ-n BP remains stable.   Hypersomnolence disorder, with mental disorder, acute, moderate/Depression with anxiety:Mood is stable.  Continue home meds.  Abnormal LFTs:Suspect due to rhabdomyolysis, improved. Hepatitis panel negative.  Resolved AKI likely due to rhabdomyolysis Continue IV fluid normal saline at 75 cc/h Continue to monitor urine output  Elevated troponinmild0.06-0.04:Denies chest pain likely in the setting of rhabdomyolysis.  Peaked at 0.06 and trended down.  Resolved Acute metabolic encephalopathy:Likely multifactorial,CT head and C-spine no acute intracranial finding. Patient has been not taking any Xanax for the last 2 weeks and was started on vitamin a month ago, has been on buspirone for a long time. At this time overall mental status seems to  have improved. Still has some poor memory and appears somewhat forgetful-stable baseline.  At this time advised outpatient follow-up.Referredto  outpatient cardiac monitor for 30 days.message was sent to cardiology to review by Dr Erlinda Hong.  Resolved Metabolic acidosis:  Resolved Hypoglycemia:   Resolved hypokalemia  Hypomagnesemia repleted daily with oral mag2+ supplement mag lw and will order 2 gm mag rider and cont po magox  Physical debility PT to assess Fall precautions  COVID19 screening negative  DVT prophylaxis: SCD. Code Status: full code Family Communication: Plan of care discussed with patient and is in agreement.   Disposition Plan: Anticipate discharge in next 24 hours pending further improvement in his CK and if no more hypoglycemia.    Consultants:  IR  Procedures: Image-guided T8 VP on 6/16 with biopsy   Antibiotics:  Vanc/cefepime x1 on admission  Rocephin  Stopped 6/15       Objective: Vitals:   12/13/18 1932 12/14/18 0045 12/14/18 0357 12/14/18 0932  BP: (!) 141/89  140/86 140/88  Pulse: 77  72 91  Resp: 20  20 18   Temp: 98.4 F (36.9 C)  98.2 F (36.8 C) 98.1 F (36.7 C)  TempSrc: Oral  Oral Oral  SpO2: 100%  100% 97%  Weight:  80.6 kg    Height:        Intake/Output Summary (Last 24 hours) at 12/14/2018 1113 Last data filed at 12/14/2018 1056 Gross per 24 hour  Intake 51574.41 ml  Output 2975 ml  Net 48599.41 ml   Filed Weights   12/12/18 0331 12/13/18 0620 12/14/18 0045  Weight: 81.2 kg 84.2 kg 80.6 kg    Exam:  . General: 48 y.o. year-old male well developed well nourished in no acute distress.  Alert and oriented x3. . Cardiovascular: Regular rate and rhythm with no rubs or gallops.  No thyromegaly or JVD noted.   Marland Kitchen Respiratory: Clear to auscultation with no wheezes or rales. Good inspiratory effort. . Abdomen: Soft nontender nondistended with normal bowel sounds x4 quadrants. . Musculoskeletal: No lower extremity edema. 2/4 pulses in all 4 extremities. Marland Kitchen Psychiatry: Mood is appropriate for condition and setting   Data Reviewed: CBC: Recent Labs  Lab  12/07/18 1813 12/08/18 0349 12/09/18 0656 12/10/18 0452  WBC 23.5* 16.0* 7.2 6.2  NEUTROABS 20.8*  --   --   --   HGB 17.1* 13.1 11.8* 12.6*  HCT 48.7 38.1* 33.2* 35.6*  MCV 89.0 91.6 88.1 88.6  PLT 293 214 188 419   Basic Metabolic Panel: Recent Labs  Lab 12/08/18 0349  12/10/18 0452 12/11/18 0628 12/12/18 0503 12/13/18 0541 12/14/18 0613  NA 136   < > 141 140 140 141 137  K 4.1   < > 3.2* 3.9 3.0* 3.7 3.5  CL 108   < > 104 103 101 105 101  CO2 14*   < > 26 29 30 29 24   GLUCOSE 90   < > 95 107* 105* 100* 92  BUN 47*   < > 7 5* <5* 6 8  CREATININE 2.04*   < > 0.97 0.81 0.78 0.76 0.71  CALCIUM 8.1*   < > 8.9 8.7* 8.7* 8.7* 9.1  MG 2.6*  --   --  1.2* 1.3* 1.4* 1.6*   < > = values in this interval not displayed.   GFR: Estimated Creatinine Clearance: 129 mL/min (by C-G formula based on SCr of 0.71 mg/dL). Liver Function Tests: Recent Labs  Lab 12/10/18 3790 12/11/18 2409 12/12/18 0503 12/13/18 0541  12/14/18 0613  AST 595* 421* 272* 175* 162*  ALT 234* 201* 176* 156* 179*  ALKPHOS 41 44 46 44 56  BILITOT 0.5 0.4 0.4 0.5 0.3  PROT 5.6* 5.4* 5.2* 5.1* 5.8*  ALBUMIN 3.0* 2.7* 2.6* 2.6* 3.0*   No results for input(s): LIPASE, AMYLASE in the last 168 hours. No results for input(s): AMMONIA in the last 168 hours. Coagulation Profile: Recent Labs  Lab 12/13/18 0846  INR 1.0   Cardiac Enzymes: Recent Labs  Lab 12/07/18 1813 12/08/18 0108 12/08/18 0349 12/08/18 1142  12/10/18 0452 12/11/18 0628 12/12/18 0503 12/13/18 0541 12/14/18 0613  CKTOTAL >50,000*  --  >50,000*  --    < > 32,170* 14,149* 7,363* 2,423* 1,217*  TROPONINI 0.05* 0.05* 0.06* 0.04*  --   --   --   --   --   --    < > = values in this interval not displayed.   BNP (last 3 results) No results for input(s): PROBNP in the last 8760 hours. HbA1C: No results for input(s): HGBA1C in the last 72 hours. CBG: Recent Labs  Lab 12/11/18 0048 12/11/18 0416 12/11/18 1300 12/11/18 1637 12/11/18  2037  GLUCAP 143* 89 101* 117* 95   Lipid Profile: No results for input(s): CHOL, HDL, LDLCALC, TRIG, CHOLHDL, LDLDIRECT in the last 72 hours. Thyroid Function Tests: No results for input(s): TSH, T4TOTAL, FREET4, T3FREE, THYROIDAB in the last 72 hours. Anemia Panel: No results for input(s): VITAMINB12, FOLATE, FERRITIN, TIBC, IRON, RETICCTPCT in the last 72 hours. Urine analysis:    Component Value Date/Time   COLORURINE STRAW (A) 12/12/2018 1720   APPEARANCEUR CLEAR 12/12/2018 1720   LABSPEC 1.009 12/12/2018 1720   PHURINE 9.0 (H) 12/12/2018 1720   GLUCOSEU NEGATIVE 12/12/2018 1720   HGBUR NEGATIVE 12/12/2018 1720   BILIRUBINUR NEGATIVE 12/12/2018 1720   BILIRUBINUR small (A) 06/06/2015 1020   BILIRUBINUR neg 07/23/2014 1058   KETONESUR NEGATIVE 12/12/2018 1720   PROTEINUR NEGATIVE 12/12/2018 1720   UROBILINOGEN 0.2 06/06/2015 1020   UROBILINOGEN 0.2 02/12/2014 2204   NITRITE NEGATIVE 12/12/2018 1720   LEUKOCYTESUR NEGATIVE 12/12/2018 1720   Sepsis Labs: @LABRCNTIP (procalcitonin:4,lacticidven:4)  ) Recent Results (from the past 240 hour(s))  SARS Coronavirus 2 (CEPHEID - Performed in [facility] hospital lab), Hosp Order     Status: None   Collection Time: 12/07/18 10:36 PM   Specimen: Nasopharyngeal Swab  Result Value Ref Range Status   SARS Coronavirus 2 NEGATIVE NEGATIVE Final    Comment: (NOTE) If result is NEGATIVE SARS-CoV-2 target nucleic acids are NOT DETECTED. The SARS-CoV-2 RNA is generally detectable in upper and lower  respiratory specimens during the acute phase of infection. The lowest  concentration of SARS-CoV-2 viral copies this assay can detect is 250  copies / mL. A negative result does not preclude SARS-CoV-2 infection  and should not be used as the sole basis for treatment or other  patient management decisions.  A negative result may occur with  improper specimen collection / handling, submission of specimen other  than nasopharyngeal swab,  presence of viral mutation(s) within the  areas targeted by this assay, and inadequate number of viral copies  (<250 copies / mL). A negative result must be combined with clinical  observations, patient history, and epidemiological information. If result is POSITIVE SARS-CoV-2 target nucleic acids are DETECTED. The SARS-CoV-2 RNA is generally detectable in upper and lower  respiratory specimens dur ing the acute phase of infection.  Positive  results are indicative of active  infection with SARS-CoV-2.  Clinical  correlation with patient history and other diagnostic information is  necessary to determine patient infection status.  Positive results do  not rule out bacterial infection or co-infection with other viruses. If result is PRESUMPTIVE POSTIVE SARS-CoV-2 nucleic acids MAY BE PRESENT.   A presumptive positive result was obtained on the submitted specimen  and confirmed on repeat testing.  While 2019 novel coronavirus  (SARS-CoV-2) nucleic acids may be present in the submitted sample  additional confirmatory testing may be necessary for epidemiological  and / or clinical management purposes  to differentiate between  SARS-CoV-2 and other Sarbecovirus currently known to infect humans.  If clinically indicated additional testing with an alternate test  methodology [phone]) is advised. The SARS-CoV-2 RNA is generally  detectable in upper and lower respiratory sp ecimens during the acute  phase of infection. The expected result is Negative. Fact Sheet for Patients:  StrictlyIdeas.no Fact Sheet for Healthcare Providers: BankingDealers.co.za This test is not yet approved or cleared by the [place] FDA and has been authorized for detection and/or diagnosis of SARS-CoV-2 by FDA under an Emergency Use Authorization (EUA).  This EUA will remain in effect (meaning this test can be used) for the duration of the COVID-19 declaration under  Section 564(b)(1) of the Act, 21 U.S.C. section 360bbb-3(b)(1), unless the authorization is terminated or revoked sooner. Performed at [facility], [place] [address], [place], [place] [address]   Urine Culture     Status: None   Collection Time: 12/08/18 12:52 AM   Specimen: Urine, Random  Result Value Ref Range Status   Specimen Description URINE, RANDOM  Final   Special Requests NONE  Final   Culture   Final    NO GROWTH Performed at [facility], [place] [address], [place], [place] [address]    Report Status 12/09/2018 FINAL  Final  Culture, blood (x 2)     Status: None   Collection Time: 12/08/18  1:08 AM   Specimen: BLOOD  Result Value Ref Range Status   Specimen Description BLOOD LEFT ARM  Final   Special Requests   Final    BOTTLES DRAWN AEROBIC ONLY Blood Culture results may not be optimal due to an inadequate volume of blood received in culture bottles   Culture   Final    NO GROWTH 5 DAYS Performed at [facility], [place] [address] [place], [place] [address]    Report Status 12/13/2018 FINAL  Final  Culture, blood (x 2)     Status: Abnormal   Collection Time: 12/08/18  1:08 AM   Specimen: BLOOD  Result Value Ref Range Status   Specimen Description BLOOD RIGHT ARM  Final   Special Requests   Final    BOTTLES DRAWN AEROBIC AND ANAEROBIC Blood Culture adequate volume   Culture  Setup Time   Final    GRAM POSITIVE COCCI IN CHAINS AEROBIC BOTTLE ONLY CRITICAL RESULT CALLED TO, READ BACK BY AND VERIFIED WITH: PHRMD V BRYK @0052  12/09/18 BY S GEZAHEGN    Culture (A)  Final    VIRIDANS STREPTOCOCCUS THE SIGNIFICANCE OF ISOLATING THIS ORGANISM FROM A SINGLE SET OF BLOOD CULTURES WHEN MULTIPLE SETS ARE DRAWN IS UNCERTAIN. PLEASE NOTIFY THE MICROBIOLOGY DEPARTMENT WITHIN ONE WEEK IF SPECIATION AND SENSITIVITIES ARE REQUIRED. Performed at [facility], [place] [address], [place], [place] [address]    Report Status 12/11/2018 FINAL  Final  Blood Culture ID  Panel (Reflexed)  Status: Abnormal   Collection Time: 12/08/18  1:08 AM  Result Value Ref Range Status   Enterococcus species NOT DETECTED NOT DETECTED Final   Listeria monocytogenes NOT DETECTED NOT DETECTED Final   Staphylococcus species NOT DETECTED NOT DETECTED Final   Staphylococcus aureus (BCID) NOT DETECTED NOT DETECTED Final   Streptococcus species DETECTED (A) NOT DETECTED Final    Comment: Not Enterococcus species, Streptococcus agalactiae, Streptococcus pyogenes, or Streptococcus pneumoniae. CRITICAL RESULT CALLED TO, READ BACK BY AND VERIFIED WITH: PHRMD V BRYK @0052  12/09/18 BY S GEZAHEGN    Streptococcus agalactiae NOT DETECTED NOT DETECTED Final   Streptococcus pneumoniae NOT DETECTED NOT DETECTED Final   Streptococcus pyogenes NOT DETECTED NOT DETECTED Final   Acinetobacter baumannii NOT DETECTED NOT DETECTED Final   Enterobacteriaceae species NOT DETECTED NOT DETECTED Final   Enterobacter cloacae complex NOT DETECTED NOT DETECTED Final   Escherichia coli NOT DETECTED NOT DETECTED Final   Klebsiella oxytoca NOT DETECTED NOT DETECTED Final   Klebsiella pneumoniae NOT DETECTED NOT DETECTED Final   Proteus species NOT DETECTED NOT DETECTED Final   Serratia marcescens NOT DETECTED NOT DETECTED Final   Haemophilus influenzae NOT DETECTED NOT DETECTED Final   Neisseria meningitidis NOT DETECTED NOT DETECTED Final   Pseudomonas aeruginosa NOT DETECTED NOT DETECTED Final   Candida albicans NOT DETECTED NOT DETECTED Final   Candida glabrata NOT DETECTED NOT DETECTED Final   Candida krusei NOT DETECTED NOT DETECTED Final   Candida parapsilosis NOT DETECTED NOT DETECTED Final   Candida tropicalis NOT DETECTED NOT DETECTED Final    Comment: Performed at [facility], [place] [address], [place], [place] [address]  Culture, blood (routine x 2)     Status: None (Preliminary result)   Collection Time: 12/11/18 10:34 AM   Specimen: BLOOD  Result Value Ref Range Status    Specimen Description BLOOD LEFT ANTECUBITAL  Final   Special Requests   Final    BOTTLES DRAWN AEROBIC AND ANAEROBIC Blood Culture results may not be optimal due to an inadequate volume of blood received in culture bottles   Culture   Final    NO GROWTH 2 DAYS Performed at [facility], [place] [address] [place], [place] [address]    Report Status PENDING  Incomplete  Culture, blood (routine x 2)     Status: None (Preliminary result)   Collection Time: 12/11/18 10:35 AM   Specimen: BLOOD  Result Value Ref Range Status   Specimen Description BLOOD RIGHT ANTECUBITAL  Final   Special Requests   Final    BOTTLES DRAWN AEROBIC AND ANAEROBIC Blood Culture adequate volume   Culture   Final    NO GROWTH 2 DAYS Performed at [facility], [place] [address] [place], [place] [address]    Report Status PENDING  Incomplete      Studies: No results found.  Scheduled Meds: . aspirin EC  81 mg Oral Daily  . busPIRone  15 mg Oral BID  . magnesium oxide  400 mg Oral BID  . methylphenidate  5 mg Oral TID WC  . senna-docusate  1 tablet Oral BID    Continuous Infusions: . sodium chloride 1,000 mL (12/11/18 0949)  . sodium chloride 75 mL/hr at 12/13/18 1727     LOS: 6 days     Kayleen Memos, MD [facility] Pager [phone]  If 7PM-7AM, please contact night-coverage [email or web address] Password [email or web address] 12/14/2018, 11:13 AM

## 2018-12-14 NOTE — Progress Notes (Signed)
Physical Therapy Treatment Patient Details Name: Donald Jenkins MRN: [id number] DOB: 1971/03/23 Today's Date: 12/14/2018    History of Present Illness Pt is a 48 y/o male admitted after being found on the floor of his home. Reporting increased back pain. PT with rhabdomyolysis and T8 compression fx. Pt is now s/p kyphoplasty.  PMH includes HTN, anxiety, and hypersolomnence disorder.    PT Comments    Pt progressing towards goals. Pt able to verbally recall precautions, however, had difficulty maintaining precautions, especially during bed mobility. Required max cues not to bend over and twist during mobility tasks. Pt required review from morning OT session of why he could not drive until cleared by MD. Overall at a supervision to independent level for mobility tasks. Feel he would benefit from continued acute PT in order to review and ensure appropriate maintenance of current back precautions during mobility tasks.    Follow Up Recommendations  No PT follow up     Equipment Recommendations  None recommended by PT    Recommendations for Other Services       Precautions / Restrictions Precautions Precautions: Back Precaution Booklet Issued: Yes (comment) Precaution Comments: Pt able to recall 2/3 precautions verbally, however, required max cues to maintain throughout session.  Restrictions Weight Bearing Restrictions: No    Mobility  Bed Mobility Overal bed mobility: Needs Assistance Bed Mobility: Rolling;Sidelying to Sit;Sit to Sidelying Rolling: Supervision Sidelying to sit: Supervision     Sit to sidelying: Supervision General bed mobility comments: max cues to maintain precautions, especially to prevent twisting.   Transfers Overall transfer level: Independent Equipment used: None                Ambulation/Gait Ambulation/Gait assistance: Independent Gait Distance (Feet): 400 Feet Assistive device: None Gait Pattern/deviations: WFL(Within Functional  Limits) Gait velocity: WFL    General Gait Details: Overall steady gait with no LOB noted. Educated about walking program to perform at home.    Stairs Stairs: Yes Stairs assistance: Supervision Stair Management: One rail Right;Alternating pattern;Forwards Number of Stairs: 6 General stair comments: Supervision for safety. Use of rail for increased stability.    Wheelchair Mobility    Modified Rankin (Stroke Patients Only)       Balance Overall balance assessment: No apparent balance deficits (not formally assessed)                                          Cognition Arousal/Alertness: Awake/alert Behavior During Therapy: WFL for tasks assessed/performed Overall Cognitive Status: No family/caregiver present to determine baseline cognitive functioning Area of Impairment: Memory;Safety/judgement;Awareness                     Memory: Decreased recall of precautions;Decreased short-term memory   Safety/Judgement: Decreased awareness of safety Awareness: Emergent   General Comments: Required cues to maintain back precautions throughout mobility tasks. Required review of why he could not drive until cleared by MD.       Exercises      General Comments        Pertinent Vitals/Pain Pain Assessment: Faces Faces Pain Scale: Hurts little more Pain Location: back with movement Pain Descriptors / Indicators: Aching Pain Intervention(s): Limited activity within patient's tolerance;Monitored during session;Repositioned    Home Living                      Prior  Function            PT Goals (current goals can now be found in the care plan section) Acute Rehab PT Goals Patient Stated Goal: for pain to get better PT Goal Formulation: With patient Time For Goal Achievement: 12/22/18 Potential to Achieve Goals: Good Progress towards PT goals: Progressing toward goals    Frequency    Min 3X/week      PT Plan Current plan remains  appropriate    Co-evaluation              AM-PAC PT "6 Clicks" Mobility   Outcome Measure  Help needed turning from your back to your side while in a flat bed without using bedrails?: A Little Help needed moving from lying on your back to sitting on the side of a flat bed without using bedrails?: A Little Help needed moving to and from a bed to a chair (including a wheelchair)?: None Help needed standing up from a chair using your arms (e.g., wheelchair or bedside chair)?: None Help needed to walk in hospital room?: None Help needed climbing 3-5 steps with a railing? : None 6 Click Score: 22    End of Session Equipment Utilized During Treatment: Gait belt Activity Tolerance: Patient tolerated treatment well Patient left: in bed;with call bell/phone within reach;with nursing/sitter in room Nurse Communication: Mobility status PT Visit Diagnosis: Pain;Other abnormalities of gait and mobility (R26.89) Pain - part of body: (back)     Time: 1645-1700 PT Time Calculation (min) (ACUTE ONLY): 15 min  Charges:  $Gait Training: 8-22 mins                     Leighton Ruff, PT, DPT  Acute Rehabilitation Services  Pager: [phone] Office: [phone]    Rudean Hitt 12/14/2018, 6:13 PM

## 2018-12-14 NOTE — Progress Notes (Signed)
Occupational Therapy Treatment Patient Details Name: Donald Jenkins MRN: [id number] DOB: Nov 16, 1970 Today's Date: 12/14/2018    History of present illness Pt is a 48 y/o male admitted after being found on the floor of his home. Reporting increased back pain. PT with rhabdomyolysis and T8 compression fx. kyphoplasty planned 6/16.  PMH includes HTN, anxiety, and hypersolomnence disorder.   OT comments  Pt with ZERO recall of back precautions - able to tell me that he had a handout, find handout. We reviewed IN FULL. Talked about importance of no "BLT" for at least 2 weeks, no driving for 2 weeks. 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 Educated that he needs to ask MD for note that would allow him to do "light duty" Pt requires mod to max cues to maintain back precautions currently. Educated for LB dressing and also discussed IADL around the house where you twist or bed a lot (loading dishwasher, getting things out of refrigerator, wiping after BM, flushing the toilet). Pt verbalized understanding and could recite precautions, but continues to be unable to maintain. Pt also asking about nutrition frequently throughout session despite OT telling him I am not qualified to educate him in this area. OT will continue to follow acutely.    Follow Up Recommendations  No OT follow up    Equipment Recommendations  3 in 1 bedside commode    Recommendations for Other Services      Precautions / Restrictions Precautions Precautions: Back Precaution Booklet Issued: Yes (comment) Precaution Comments: zero recall of precautions, reviewed in full again Restrictions Weight Bearing Restrictions: No       Mobility Bed Mobility Overal bed mobility: Needs Assistance Bed Mobility: Rolling;Sidelying to Sit Rolling: Supervision Sidelying to sit: Supervision       General bed mobility comments: max cues to prevent twisting  Transfers Overall  transfer level: Independent Equipment used: None                  Balance Overall balance assessment: Independent           Standing balance-Leahy Scale: Normal                             ADL either performed or assessed with clinical judgement   ADL Overall ADL's : Needs assistance/impaired     Grooming: Wash/dry hands;Wash/dry face;Oral care;Supervision/safety;Cueing for compensatory techniques;Standing Grooming Details (indicate cue type and reason): Pt frequently twists - multimodal cues and edcuation in compensatory strategies             Lower Body Dressing: Supervision/safety;Cueing for back precautions;Sit to/from stand Lower Body Dressing Details (indicate cue type and reason): Pt initially starts to bend over to put on socks, educated in figure 4 technique - Pt able to perform Toilet Transfer: Supervision/safety;Ambulation   Toileting- Clothing Manipulation and Hygiene: Supervision/safety;Sit to/from stand       Functional mobility during ADLs: Supervision/safety       Vision       Perception     Praxis      Cognition Arousal/Alertness: Awake/alert Behavior During Therapy: WFL for tasks assessed/performed Overall Cognitive Status: No family/caregiver present to determine baseline cognitive functioning Area of Impairment: Memory;Safety/judgement;Awareness                     Memory: Decreased recall of precautions;Decreased short-term memory   Safety/Judgement: Decreased awareness of  safety Awareness: Emergent   General Comments: Pt with ZERO recall of back precautions, repeating the same questions throughout session (kept wanting OT to provide nutrition education despite OT telling him I could not help him with that area), required mod cues throughout session for back precautions        Exercises     Shoulder Instructions       General Comments      Pertinent Vitals/ Pain       Pain Assessment: 0-10 Pain  Score: 6  Faces Pain Scale: Hurts a little bit Pain Location: back with movement Pain Descriptors / Indicators: Aching Pain Intervention(s): Monitored during session;Repositioned  Home Living                                          Prior Functioning/Environment              Frequency  Min 2X/week        Progress Toward Goals  OT Goals(current goals can now be found in the care plan section)  Progress towards OT goals: Progressing toward goals  Acute Rehab OT Goals Patient Stated Goal: for pain to get better OT Goal Formulation: With patient Time For Goal Achievement: 12/16/18 Potential to Achieve Goals: Good  Plan Discharge plan remains appropriate;Frequency remains appropriate    Co-evaluation                 AM-PAC OT "6 Clicks" Daily Activity     Outcome Measure   Help from another person eating meals?: None Help from another person taking care of personal grooming?: None Help from another person toileting, which includes using toliet, bedpan, or urinal?: None Help from another person bathing (including washing, rinsing, drying)?: None Help from another person to put on and taking off regular upper body clothing?: None Help from another person to put on and taking off regular lower body clothing?: None 6 Click Score: 24    End of Session    OT Visit Diagnosis: Pain Pain - part of body: (lumbar)   Activity Tolerance Patient tolerated treatment well   Patient Left Other (comment)(EOB with NT taking vitals)   Nurse Communication Mobility status        Time: 7510-2585 OT Time Calculation (min): 25 min  Charges: OT General Charges $OT Visit: 1 Visit OT Treatments $Self Care/Home Management : 23-37 mins  Hulda Humphrey OTR/L Acute Rehabilitation Services Pager: [phone] Office: [place] 12/14/2018, 11:27 AM

## 2018-12-15 ENCOUNTER — Telehealth: Payer: Self-pay | Admitting: *Deleted

## 2018-12-15 ENCOUNTER — Encounter (HOSPITAL_COMMUNITY): Payer: Self-pay | Admitting: Interventional Radiology

## 2018-12-15 LAB — COMPREHENSIVE METABOLIC PANEL
ALT: 169 U/L — ABNORMAL HIGH (ref 0–44)
AST: 127 U/L — ABNORMAL HIGH (ref 15–41)
Albumin: 3 g/dL — ABNORMAL LOW (ref 3.5–5.0)
Alkaline Phosphatase: 61 U/L (ref 38–126)
Anion gap: 7 (ref 5–15)
BUN: 10 mg/dL (ref 6–20)
CO2: 29 mmol/L (ref 22–32)
Calcium: 9.2 mg/dL (ref 8.9–10.3)
Chloride: 103 mmol/L (ref 98–111)
Creatinine, Ser: 0.78 mg/dL (ref 0.61–1.24)
GFR calc Af Amer: >60 mL/min (ref >60)
GFR calc non Af Amer: >60 mL/min (ref >60)
Glucose, Bld: 86 mg/dL (ref 70–99)
Potassium: 3.9 mmol/L (ref 3.5–5.1)
Sodium: 139 mmol/L (ref 135–145)
Total Bilirubin: 0.6 mg/dL (ref 0.3–1.2)
Total Protein: 5.6 g/dL — ABNORMAL LOW (ref 6.5–8.1)

## 2018-12-15 LAB — CK: Total CK: 595 U/L — ABNORMAL HIGH (ref 49–397)

## 2018-12-15 MED ORDER — ASPIRIN 81 MG PO TBEC: 81.0000 mg | DELAYED_RELEASE_TABLET | Freq: Every day | ORAL | 0 refills | Status: DC

## 2018-12-15 MED ORDER — OXYCODONE HCL 5 MG PO TABS: 5.0000 mg | ORAL_TABLET | Freq: Two times a day (BID) | ORAL | 0 refills | Status: DC | PRN

## 2018-12-15 MED ORDER — AMLODIPINE BESYLATE 5 MG PO TABS: 5.0000 mg | ORAL_TABLET | Freq: Every day | ORAL | 0 refills | Status: DC

## 2018-12-15 NOTE — Discharge Summary (Addendum)
Discharge Summary  Donald Jenkins JTT:[id number] DOB: [birth year]  PCP: Patient, No Pcp Per  Admit date: 12/07/2018 Discharge date: 12/15/2018  Time spent: 35 minutes  Recommendations for Outpatient Follow-up:  1. Follow-up with interventional radiology 2. Follow-up with your PCP 3. Follow-up with cardiology for a 30-day cardiac monitor. 4. Take your medications as prescribed 5. Do not lift more than 10 pounds x 2 weeks 6. Do not drive x2 weeks   Discharge Diagnoses:  Active Hospital Problems   Diagnosis Date Noted   Rhabdomyolysis 12/07/2018   Hypomagnesemia    Fall    Hypoglycemia 12/09/2018   Bacteremia 00/92/3300   Acute metabolic encephalopathy 76/22/6333   Metabolic acidosis 54/56/2563   Compression fracture of body of thoracic vertebra (HCC) 12/08/2018   SIRS (systemic inflammatory response syndrome) ([place]) 12/07/2018   Back pain 12/07/2018   Depression with anxiety 12/07/2018   Abnormal LFTs 12/07/2018   AKI (acute kidney injury) ([place]) 12/07/2018   Elevated troponin 12/07/2018   Hypersomnolence disorder, with mental disorder, acute, moderate 11/01/2018   Essential hypertension, benign 06/06/2015    Resolved Hospital Problems  No resolved problems to display.    Discharge Condition: Stable  Diet recommendation: Resume previous diet  Vitals:   12/14/18 1917 12/15/18 0421  BP: (!) 151/89 115/75  Pulse: 78 78  Resp: 20 20  Temp: 98.4 F (36.9 C) 98.3 F (36.8 C)  SpO2: 100% 99%    History of present illness:  48 year old male with history of hypertension, anxiety,hypersomnicdisorder who was admitted after being found on the floor of his home, reported increased back pain. In the ER significant rhabdomyolysis for more than 50,000 CK and T8 compression fracture. Patient reports he has had back pain and gets flareups intermittently, worsefor 1 week. In ER,CK >50,000,alcohol less than 10, pending COVID-19 test, UDS positive for  THC,troponin 0.05, lactic acid 1.5, WBC 23.5, urinalysis negative, Tylenol less than 10, salicylate level less than 7, abnormal liver function (ALP 68, AST 684, ALT 140, total bilirubin 1.2), AKI with creatinine 2.80, BUN 55), chest x-ray negative. Patient is placed on telemetry bed. Negative covid 19 test. Patient was admitted, treated with aggressive IV hydration, was placed on antibiotics blood culture came back positive suspected to be contamination. Treated for rhabdomyolysis, acute renal failure and mild LFT which have all significantly improved.  Bone biopsy of T8 bone no evidence of malignancy.  12/15/18: Patient seen and examined at his bedside.  No acute events overnight.  Pain in his back is improved post kyphoplasty.  He has no new concerns.  On the day of discharge, the patient was hemodynamically stable.  He will need to follow-up with interventional radiology and his PCP posthospitalization.   Hospital Course:  Principal Problem:   Rhabdomyolysis Active Problems:   Essential hypertension, benign   Hypersomnolence disorder, with mental disorder, acute, moderate   SIRS (systemic inflammatory response syndrome) (HCC)   Back pain   Depression with anxiety   Abnormal LFTs   AKI (acute kidney injury) ([place])   Elevated troponin   Acute metabolic encephalopathy   Metabolic acidosis   Compression fracture of body of thoracic vertebra (HCC)   Hypoglycemia   Bacteremia   Fall   Hypomagnesemia  SevereRhabdomyolysis CK more than 50,000.Suspect due to prolonged immobilization. CPK continues to improve down to 500 range.  Continue to encourage oral fluid intake.  Avoid dehydration. Follow-up with your PCP.  Acute on chronic back pain with imaging showing acute T8 compression fracture status post kyphoplasty with  biopsy by interventional radiology on 12/13/2018. Back pain has significantly improved from 10 out of 10 to 6 out of 10 today. Instructed in the room not to lift more  than 10 pounds x 2 weeks and not to drive x2 weeks.   Strepbacteremia on 12/08/18/SIRS.Informed by Junie Panning from ID, recommend stopping antibiotics asstrep inonly1outof 3 blood culture likely contamination, no obvious infectious source identified. His blood culture Repeat 6/14 NGTD  Essential hypertension,BP is normotensive. C/w holding LISINOPRIL/HCTZ BP remains stable.   Hypersomnolence disorder, with mental disorder, acute, moderate/Depression with anxiety:Mood is stable.Continue home meds.  Abnormal LFTs:Suspect due to rhabdomyolysis, improved. Hepatitis panel negative.  Resolved AKI likely due to rhabdomyolysis Avoid dehydration  Elevated troponinmild:Denies chest pain likely in the setting of rhabdomyolysis.  Peaked at 0.06 and trended down.  Resolved Acute metabolic encephalopathy:Likely multifactorial,CT head and C-spine no acute intracranial finding. Patient has been not taking any Xanax for the last 2 weeks and was started on vitamin a month ago, has been on buspirone for a long time. At this time overall mental status seems to have improved. Still has some poor memory and appears somewhat forgetful-stable baseline.  At this time advised outpatient follow-up.Referredto outpatient cardiac monitor for 30 days.message was sent to cardiology to review by Dr Erlinda Hong.  Resolved Metabolic acidosis:  Resolved Hypoglycemia:  Resolved hypokalemia  Hypomagnesemia post repletion repleted daily with oral mag2+ supplement mag lw and will order 2 gm mag rider and cont po magox  Physical debility PT OT assessed and had no follow-up recommendations. Fall precautions  COVID19 screening negative   Code Status:full code  Consultants:  IR  Procedures: Image-guided T8 VP on 6/16with biopsy  Antibiotics:  Vanc/cefepime x1 on admission  Rocephin Stopped 12/12/18      Discharge Exam: BP 115/75 (BP Location: Right Arm)    Pulse 78    Temp  98.3 F (36.8 C) (Oral)    Resp 20    Ht 6\' 1"  (1.854 m)    Wt 78.8 kg    SpO2 99%    BMI 22.93 kg/m   General: 48 y.o. year-old male well developed well nourished in no acute distress.  Alert and oriented x3.  Cardiovascular: Regular rate and rhythm with no rubs or gallops.  No thyromegaly or JVD noted.    Respiratory: Clear to auscultation with no wheezes or rales. Good inspiratory effort.  Abdomen: Soft nontender nondistended with normal bowel sounds x4 quadrants.  Musculoskeletal: No lower extremity edema. 2/4 pulses in all 4 extremities.  Psychiatry: Mood is appropriate for condition and setting  Discharge Instructions You were cared for by a hospitalist during your hospital stay. If you have any questions about your discharge medications or the care you received while you were in the hospital after you are discharged, you can call the unit and asked to speak with the hospitalist on call if the hospitalist that took care of you is not available. Once you are discharged, your primary care physician will handle any further medical issues. Please note that NO REFILLS for any discharge medications will be authorized once you are discharged, as it is imperative that you return to your primary care physician (or establish a relationship with a primary care physician if you do not have one) for your aftercare needs so that they can reassess your need for medications and monitor your lab values.  Discharge Instructions    Ambulatory referral to Physical Therapy   Complete by: As directed      Allergies  as of 12/15/2018      Reactions   Penicillins Other (See Comments)   Did it involve swelling of the face/tongue/throat, SOB, or low BP? Unknown Did it involve sudden or severe rash/hives, skin peeling, or any reaction on the inside of your mouth or nose? Unknown Did you need to seek medical attention at a hospital or doctor's office? Unknown When did it last happen?childhood If all  above answers are NO, may proceed with cephalosporin use.      Medication List    STOP taking these medications   busPIRone 15 MG tablet Commonly known as: BUSPAR   ibuprofen 200 MG tablet Commonly known as: ADVIL   lisinopril-hydrochlorothiazide 10-12.5 MG tablet Commonly known as: ZESTORETIC   montelukast 10 MG tablet Commonly known as: SINGULAIR   zaleplon 10 MG capsule Commonly known as: SONATA     TAKE these medications   ALPRAZolam 1 MG tablet Commonly known as: XANAX Take 1 tablet (1 mg total) by mouth 3 (three) times daily as needed for anxiety.   amLODipine 5 MG tablet Commonly known as: NORVASC Take 1 tablet (5 mg total) by mouth daily.   aspirin 81 MG EC tablet Take 1 tablet (81 mg total) by mouth daily.   methylphenidate 5 MG tablet Commonly known as: RITALIN Take 1 tablet (5 mg total) by mouth 3 (three) times daily with meals.   oxyCODONE 5 MG immediate release tablet Commonly known as: Oxy IR/ROXICODONE Take 1 tablet (5 mg total) by mouth 2 (two) times daily as needed for up to 10 doses for severe pain.            Durable Medical Equipment  (From admission, onward)         Start     Ordered   12/14/18 1518  For home use only DME 3 n 1  Once     12/14/18 1517         Allergies  Allergen Reactions   Penicillins Other (See Comments)    Did it involve swelling of the face/tongue/throat, SOB, or low BP? Unknown Did it involve sudden or severe rash/hives, skin peeling, or any reaction on the inside of your mouth or nose? Unknown Did you need to seek medical attention at a hospital or doctor's office? Unknown When did it last happen?childhood If all above answers are NO, may proceed with cephalosporin use.    Follow-up Information    [facility] Follow up.   Specialty: Cardiology Why: please call cardiology office if you donot hear from then in three business days cardiology office to arrange 30day cardiac  monitor Contact information: [address], [address] [place]. Call in 1 day(s).   Why: Please call for a post hospital follow-up appointment. Contact information: [place] [address] [phone]       Luanne Bras, MD. Call in 1 day(s).   Specialties: Interventional Radiology, Radiology Why: Please call for a post hospital follow-up appointment. Contact information: [place] [address] [phone]            The results of significant diagnostics from this hospitalization (including imaging, microbiology, ancillary and laboratory) are listed below for reference.    Significant Diagnostic Studies: Dg Thoracic Spine 2 View  Result Date: 12/08/2018 CLINICAL DATA:  48 year old male status post fall.  Pain. EXAM: THORACIC SPINE 2 VIEWS  COMPARISON:  Cervical spine CT today reported separately. Chest radiographs 07/02/2013. FINDINGS: Normal thoracic segmentation. There is a moderate compression fracture of T8 which is new since 2015. At other levels thoracic vertebral height and alignment appears preserved. Cervicothoracic junction alignment is within normal limits. Preserved disc spaces. Posterior ribs appear intact. Negative visible chest and abdominal visceral contours. IMPRESSION: Moderate T8 compression fracture, new since 2015 and likely acute in this clinical setting. If specific therapy is desired, Thoracic MRI without contrast or Nuclear Medicine Whole-body Bone Scan would confirm candidacy for vertebroplasty. Electronically Signed   By: Genevie Ann M.D.   On: 12/08/2018 02:10   Ct Head Wo Contrast  Result Date: 12/08/2018 CLINICAL DATA:  48 year old male status post fall. Altered mental status. EXAM: CT HEAD WITHOUT CONTRAST CT CERVICAL SPINE WITHOUT CONTRAST TECHNIQUE: Multidetector CT imaging of the head and cervical spine  was performed following the standard protocol without intravenous contrast. Multiplanar CT image reconstructions of the cervical spine were also generated. COMPARISON:  None. FINDINGS: CT HEAD FINDINGS Brain: No midline shift, ventriculomegaly, mass effect, evidence of mass lesion, intracranial hemorrhage or evidence of cortically based acute infarction. Gray-white matter differentiation is within normal limits throughout the brain. Vascular: Mild Calcified atherosclerosis at the skull base. No suspicious intracranial vascular hyperdensity. Skull: Negative. Sinuses/Orbits: Visualized paranasal sinuses and mastoids are clear. Other: Visualized orbits and scalp soft tissues are within normal limits. CT CERVICAL SPINE FINDINGS Alignment: Relatively preserved cervical lordosis. Levoconvex cervical spine curvature or scoliosis. Cervicothoracic junction alignment is within normal limits. Bilateral posterior element alignment is within normal limits. Skull base and vertebrae: Visualized skull base is intact. No atlanto-occipital dissociation. Motion artifact at C4 and C5, with repeat imaging through those levels. No cervical spine fracture identified. Benign appearing sclerosis of the left C4 facet. Normal bone mineralization otherwise. Soft tissues and spinal canal: No prevertebral fluid or swelling. No visible canal hematoma. Calcified carotid atherosclerosis greater on the left. Disc levels: Disc space loss with endplate spurring at I6-E7 and C6-C7. Upper chest: Negative visible upper thoracic levels and lung apices. IMPRESSION: 1. Normal noncontrast CT appearance of the brain. 2. No acute traumatic injury identified in the head or cervical spine. Electronically Signed   By: Genevie Ann M.D.   On: 12/08/2018 02:07   Ct Cervical Spine Wo Contrast  Result Date: 12/08/2018 CLINICAL DATA:  48 year old male status post fall. Altered mental status. EXAM: CT HEAD WITHOUT CONTRAST CT CERVICAL SPINE WITHOUT CONTRAST TECHNIQUE:  Multidetector CT imaging of the head and cervical spine was performed following the standard protocol without intravenous contrast. Multiplanar CT image reconstructions of the cervical spine were also generated. COMPARISON:  None. FINDINGS: CT HEAD FINDINGS Brain: No midline shift, ventriculomegaly, mass effect, evidence of mass lesion, intracranial hemorrhage or evidence of cortically based acute infarction. Gray-white matter differentiation is within normal limits throughout the brain. Vascular: Mild Calcified atherosclerosis at the skull base. No suspicious intracranial vascular hyperdensity. Skull: Negative. Sinuses/Orbits: Visualized paranasal sinuses and mastoids are clear. Other: Visualized orbits and scalp soft tissues are within normal limits. CT CERVICAL SPINE FINDINGS Alignment: Relatively preserved cervical lordosis. Levoconvex cervical spine curvature or scoliosis. Cervicothoracic junction alignment is within normal limits. Bilateral posterior element alignment is within normal limits. Skull base and vertebrae: Visualized skull base is intact. No atlanto-occipital dissociation. Motion artifact at C4 and C5, with repeat imaging through those levels. No cervical spine fracture identified. Benign appearing sclerosis of the left C4 facet. Normal bone mineralization otherwise. Soft tissues and spinal canal:  No prevertebral fluid or swelling. No visible canal hematoma. Calcified carotid atherosclerosis greater on the left. Disc levels: Disc space loss with endplate spurring at C5-E5 and C6-C7. Upper chest: Negative visible upper thoracic levels and lung apices. IMPRESSION: 1. Normal noncontrast CT appearance of the brain. 2. No acute traumatic injury identified in the head or cervical spine. Electronically Signed   By: Genevie Ann M.D.   On: 12/08/2018 02:07   Mr Thoracic Spine Wo Contrast  Result Date: 12/09/2018 CLINICAL DATA:  Initial evaluation for acute mid to upper back pain no known injury. EXAM: MRI  THORACIC SPINE WITHOUT CONTRAST TECHNIQUE: Multiplanar, multisequence MR imaging of the thoracic spine was performed. No intravenous contrast was administered. COMPARISON:  Prior radiograph from 12/08/2018. FINDINGS: Alignment: Mild dextroscoliosis. Alignment otherwise normal with preservation of the normal thoracic kyphosis. No listhesis or malalignment. Vertebrae: Acute to subacute appearing compression fracture of the T8 vertebral body with up to 50% central height loss with trace 2 mm bony retropulsion at the inferior posterior cortex. This is benign/mechanical in appearance. Mild height loss at the superior endplates of T4, T5, and T7 with associated Schmorl's node deformities largely chronic in appearance. No other acute or subacute compression fracture. Vertebral body height otherwise maintained. Underlying bone marrow signal intensity within normal limits. Few scattered benign hemangiomas noted, most prominent of which measures 11 mm in the T10 vertebral body. No aggressive or worrisome osseous lesions. Reactive marrow edema present about the right T4-5 facet due to facet arthritis (series 17, image 6). No other abnormal marrow edema. Cord: Signal intensity within the thoracic spinal cord is normal. Normal cord caliber and morphology. Conus terminates inferior to T12. Paraspinal and other soft tissues: Soft tissue edema present within the upper posterior paraspinous musculature (series 17, images 1, 19), suggesting muscular injury/strain. Paraspinous soft tissues demonstrate no other acute finding. Visualized lungs are largely clear. Visualized visceral structures unremarkable. Disc levels: T1-2:  Unremarkable. T2-3: Unremarkable. T3-4: Mild height loss with endplate Schmorl's node at the superior endplate of T4. Right-sided facet degeneration. Resultant mild to moderate right foraminal narrowing. No spinal stenosis or left foraminal narrowing. T4-5: Height loss at the superior endplate of T5 with associated  Schmorl's node deformity. Right greater than left facet degeneration. Mild right foraminal narrowing. No canal or left foraminal stenosis. T5-6: Mild right-sided facet degeneration. No significant stenosis. T6-7:  Minimal disc bulge.  No stenosis. T7-8:  Minimal disc bulge.  No stenosis. T8-9: Trace 2 mm bony retropulsion related to the T8 compression fracture. No significant stenosis or cord impingement. Mild left foraminal narrowing. T9-10: Mild facet hypertrophy.  No stenosis. T10-11:  Mild facet hypertrophy.  No stenosis. T11-12:  Unremarkable. T12-L1:  Unremarkable. IMPRESSION: 1. Acute to early subacute compression fracture of T8 with up to 50% height loss and trace 2 mm bony retropulsion. No significant spinal stenosis or cord impingement. Finding would be amendable to vertebral augmentation. 2. Abnormal soft tissue edema involving the upper posterior paraspinous musculature bilaterally (rhomboid major musculature), suggesting muscular injury and/or strain. 3. Reactive marrow edema about the right T4-5 facet, compatible with active facet arthritis. Finding could contribute to underlying back pain. 4. Mild multilevel thoracic degenerative spondylitic changes without significant spinal stenosis. Mild to moderate right foraminal narrowing at T3-4 and T4-5, with mild left foraminal narrowing at T8-9. Electronically Signed   By: Jeannine Boga M.D.   On: 12/09/2018 03:53   Dg Chest Portable 1 View  Result Date: 12/07/2018 CLINICAL DATA:  Back pain EXAM: PORTABLE  CHEST 1 VIEW COMPARISON:  Chest x-ray dated 02/12/2014. FINDINGS: The heart size and mediastinal contours are within normal limits. Both lungs are clear. The visualized skeletal structures are unremarkable. There are probable old healed anterior rib fractures on the right. IMPRESSION: No active disease. Electronically Signed   By: Constance Holster M.D.   On: 12/07/2018 22:05   Ir Vertebroplasty [facility]/bil  Inc/inject/imaging  Result Date: 12/15/2018 INDICATION: Thoracic back pain secondary to compression fracture at T8. EXAM: VERTEBROPLASTY AT T8 MEDICATIONS: As antibiotic prophylaxis, vancomycin 1 g IV was ordered pre-procedure and administered intravenously within 1 hour of incision. ANESTHESIA/SEDATION: Moderate (conscious) sedation was employed during this procedure. A total of Versed 2 mg and Fentanyl 75 mcg was administered intravenously. Moderate Sedation Time: 23 minutes. The patient's level of consciousness and vital signs were monitored continuously by radiology nursing throughout the procedure under my direct supervision. FLUOROSCOPY TIME:  Fluoroscopy Time: 7 minutes 56 seconds (403 mGy) COMPLICATIONS: None immediate. TECHNIQUE: Informed written consent was obtained from the patient after a thorough discussion of the procedural risks, benefits and alternatives. All questions were addressed. Maximal Sterile Barrier Technique was utilized including caps, mask, sterile gowns, sterile gloves, sterile drape, hand hygiene and skin antiseptic. A timeout was performed prior to the initiation of the procedure. PROCEDURE: The patient was placed prone on the fluoroscopic table. Nasal oxygen was administered. Physiologic monitoring was performed throughout the duration of the procedure. The skin overlying the thoracic region was prepped and draped in the usual sterile fashion. The T8 vertebral body was identified and the right pedicle was infiltrated with 0.25% Bupivacaine. This was then followed by the advancement of a 13-gauge Cook needle through the right pedicle into the posterior one-third at T8. A 16 gauge core biopsy needle was then advanced into the T8 vertebral body. Two passes were made. Tissue obtained was sent for pathologic analysis. The needle was then advanced into the anterior 1/3 at T8. A gentle contrast injection demonstrated a trabecular pattern of contrast. At this time, methylmethacrylate  mixture was reconstituted. Under biplane intermittent fluoroscopy, the methylmethacrylate was then injected into the T8 vertebral body with filling of the vertebral body. A small amount of the methylmethacrylate mixture was noted into the disc space through the fracture cleft along the superior endplate. No extension of the methylmethacrylate mixture was noted posteriorly into the spinal canal. No epidural venous contamination was seen. The needle was then removed. Hemostasis was achieved at the skin entry site. There were no acute complications. Patient tolerated the procedure well. The patient was returned to the floor in good condition. IMPRESSION: 1. Status post vertebral body augmentation for painful compression fracture at T8 using vertebroplasty technique. 2. Status post core biopsy at T8. Electronically Signed   By: Luanne Bras M.D.   On: 12/13/2018 12:37    Microbiology: Recent Results (from the past 240 hour(s))  SARS Coronavirus 2 (CEPHEID - Performed in [place] hospital lab), Hosp Order     Status: None   Collection Time: 12/07/18 10:36 PM   Specimen: Nasopharyngeal Swab  Result Value Ref Range Status   SARS Coronavirus 2 NEGATIVE NEGATIVE Final    Comment: (NOTE) If result is NEGATIVE SARS-CoV-2 target nucleic acids are NOT DETECTED. The SARS-CoV-2 RNA is generally detectable in upper and lower  respiratory specimens during the acute phase of infection. The lowest  concentration of SARS-CoV-2 viral copies this assay can detect is 250  copies / mL. A negative result does not preclude SARS-CoV-2  infection  and should not be used as the sole basis for treatment or other  patient management decisions.  A negative result may occur with  improper specimen collection / handling, submission of specimen other  than nasopharyngeal swab, presence of viral mutation(s) within the  areas targeted by this assay, and inadequate number of viral copies  (<250 copies / mL). A negative  result must be combined with clinical  observations, patient history, and epidemiological information. If result is POSITIVE SARS-CoV-2 target nucleic acids are DETECTED. The SARS-CoV-2 RNA is generally detectable in upper and lower  respiratory specimens dur ing the acute phase of infection.  Positive  results are indicative of active infection with SARS-CoV-2.  Clinical  correlation with patient history and other diagnostic information is  necessary to determine patient infection status.  Positive results do  not rule out bacterial infection or co-infection with other viruses. If result is PRESUMPTIVE POSTIVE SARS-CoV-2 nucleic acids MAY BE PRESENT.   A presumptive positive result was obtained on the submitted specimen  and confirmed on repeat testing.  While 2019 novel coronavirus  (SARS-CoV-2) nucleic acids may be present in the submitted sample  additional confirmatory testing may be necessary for epidemiological  and / or clinical management purposes  to differentiate between  SARS-CoV-2 and other Sarbecovirus currently known to infect humans.  If clinically indicated additional testing with an alternate test  methodology [phone]) is advised. The SARS-CoV-2 RNA is generally  detectable in upper and lower respiratory sp ecimens during the acute  phase of infection. The expected result is Negative. Fact Sheet for Patients:  StrictlyIdeas.no Fact Sheet for Healthcare Providers: BankingDealers.co.za This test is not yet approved or cleared by the [place] FDA and has been authorized for detection and/or diagnosis of SARS-CoV-2 by FDA under an Emergency Use Authorization (EUA).  This EUA will remain in effect (meaning this test can be used) for the duration of the COVID-19 declaration under Section 564(b)(1) of the Act, 21 U.S.C. section 360bbb-3(b)(1), unless the authorization is terminated or revoked sooner. Performed at [facility], [place] [address], [place], [place] [address]   Urine Culture     Status: None   Collection Time: 12/08/18 12:52 AM   Specimen: Urine, Random  Result Value Ref Range Status   Specimen Description URINE, RANDOM  Final   Special Requests NONE  Final   Culture   Final    NO GROWTH Performed at [facility], [place] [address] [place], [place] [address]    Report Status 12/09/2018 FINAL  Final  Culture, blood (x 2)     Status: None   Collection Time: 12/08/18  1:08 AM   Specimen: BLOOD  Result Value Ref Range Status   Specimen Description BLOOD LEFT ARM  Final   Special Requests   Final    BOTTLES DRAWN AEROBIC ONLY Blood Culture results may not be optimal due to an inadequate volume of blood received in culture bottles   Culture   Final    NO GROWTH 5 DAYS Performed at [facility], [place] [address], [place], [place] [address]    Report Status 12/13/2018 FINAL  Final  Culture, blood (x 2)     Status: Abnormal   Collection Time: 12/08/18  1:08 AM   Specimen: BLOOD  Result Value Ref Range Status   Specimen Description BLOOD RIGHT ARM  Final   Special Requests   Final    BOTTLES DRAWN AEROBIC AND ANAEROBIC Blood  Culture adequate volume   Culture  Setup Time   Final    GRAM POSITIVE COCCI IN CHAINS AEROBIC BOTTLE ONLY CRITICAL RESULT CALLED TO, READ BACK BY AND VERIFIED WITH: PHRMD V BRYK @0052  12/09/18 BY S GEZAHEGN    Culture (A)  Final    VIRIDANS STREPTOCOCCUS THE SIGNIFICANCE OF ISOLATING THIS ORGANISM FROM A SINGLE SET OF BLOOD CULTURES WHEN MULTIPLE SETS ARE DRAWN IS UNCERTAIN. PLEASE NOTIFY THE MICROBIOLOGY DEPARTMENT WITHIN ONE WEEK IF SPECIATION AND SENSITIVITIES ARE REQUIRED. Performed at [facility], [place] [address] [place], [place] [address]    Report Status 12/11/2018 FINAL  Final  Blood Culture ID Panel (Reflexed)     Status: Abnormal   Collection Time: 12/08/18  1:08 AM  Result Value Ref Range Status   Enterococcus species NOT DETECTED  NOT DETECTED Final   Listeria monocytogenes NOT DETECTED NOT DETECTED Final   Staphylococcus species NOT DETECTED NOT DETECTED Final   Staphylococcus aureus (BCID) NOT DETECTED NOT DETECTED Final   Streptococcus species DETECTED (A) NOT DETECTED Final    Comment: Not Enterococcus species, Streptococcus agalactiae, Streptococcus pyogenes, or Streptococcus pneumoniae. CRITICAL RESULT CALLED TO, READ BACK BY AND VERIFIED WITH: PHRMD V BRYK @0052  12/09/18 BY S GEZAHEGN    Streptococcus agalactiae NOT DETECTED NOT DETECTED Final   Streptococcus pneumoniae NOT DETECTED NOT DETECTED Final   Streptococcus pyogenes NOT DETECTED NOT DETECTED Final   Acinetobacter baumannii NOT DETECTED NOT DETECTED Final   Enterobacteriaceae species NOT DETECTED NOT DETECTED Final   Enterobacter cloacae complex NOT DETECTED NOT DETECTED Final   Escherichia coli NOT DETECTED NOT DETECTED Final   Klebsiella oxytoca NOT DETECTED NOT DETECTED Final   Klebsiella pneumoniae NOT DETECTED NOT DETECTED Final   Proteus species NOT DETECTED NOT DETECTED Final   Serratia marcescens NOT DETECTED NOT DETECTED Final   Haemophilus influenzae NOT DETECTED NOT DETECTED Final   Neisseria meningitidis NOT DETECTED NOT DETECTED Final   Pseudomonas aeruginosa NOT DETECTED NOT DETECTED Final   Candida albicans NOT DETECTED NOT DETECTED Final   Candida glabrata NOT DETECTED NOT DETECTED Final   Candida krusei NOT DETECTED NOT DETECTED Final   Candida parapsilosis NOT DETECTED NOT DETECTED Final   Candida tropicalis NOT DETECTED NOT DETECTED Final    Comment: Performed at [facility], [place] [address], [place], [place] [address]  Culture, blood (routine x 2)     Status: None (Preliminary result)   Collection Time: 12/11/18 10:34 AM   Specimen: BLOOD  Result Value Ref Range Status   Specimen Description BLOOD LEFT ANTECUBITAL  Final   Special Requests   Final    BOTTLES DRAWN AEROBIC AND ANAEROBIC Blood Culture results may not be  optimal due to an inadequate volume of blood received in culture bottles   Culture   Final    NO GROWTH 3 DAYS Performed at [facility], [place] [address], [place], [place] [address]    Report Status PENDING  Incomplete  Culture, blood (routine x 2)     Status: None (Preliminary result)   Collection Time: 12/11/18 10:35 AM   Specimen: BLOOD  Result Value Ref Range Status   Specimen Description BLOOD RIGHT ANTECUBITAL  Final   Special Requests   Final    BOTTLES DRAWN AEROBIC AND ANAEROBIC Blood Culture adequate volume   Culture   Final    NO GROWTH 3 DAYS Performed at [facility], [place] [address] [place], [place] [address]  Report Status PENDING  Incomplete     Labs: Basic Metabolic Panel: Recent Labs  Lab 12/11/18 0628 12/12/18 0503 12/13/18 0541 12/14/18 0613 12/15/18 0357  NA 140 140 141 137 139  K 3.9 3.0* 3.7 3.5 3.9  CL 103 101 105 101 103  CO2 29 30 29 24 29   GLUCOSE 107* 105* 100* 92 86  BUN 5* <5* 6 8 10   CREATININE 0.81 0.78 0.76 0.71 0.78  CALCIUM 8.7* 8.7* 8.7* 9.1 9.2  MG 1.2* 1.3* 1.4* 1.6*  --    Liver Function Tests: Recent Labs  Lab 12/11/18 0628 12/12/18 0503 12/13/18 0541 12/14/18 0613 12/15/18 0357  AST 421* 272* 175* 162* 127*  ALT 201* 176* 156* 179* 169*  ALKPHOS 44 46 44 56 61  BILITOT 0.4 0.4 0.5 0.3 0.6  PROT 5.4* 5.2* 5.1* 5.8* 5.6*  ALBUMIN 2.7* 2.6* 2.6* 3.0* 3.0*   No results for input(s): LIPASE, AMYLASE in the last 168 hours. No results for input(s): AMMONIA in the last 168 hours. CBC: Recent Labs  Lab 12/09/18 0656 12/10/18 0452  WBC 7.2 6.2  HGB 11.8* 12.6*  HCT 33.2* 35.6*  MCV 88.1 88.6  PLT 188 190   Cardiac Enzymes: Recent Labs  Lab 12/08/18 1142  12/11/18 0628 12/12/18 0503 12/13/18 0541 12/14/18 0613 12/15/18 0357  CKTOTAL  --    < > 14,149* 7,363* 2,423* 1,217* 595*  TROPONINI 0.04*  --   --   --   --   --   --    < > = values in this interval not displayed.   BNP: BNP (last 3  results) No results for input(s): BNP in the last 8760 hours.  ProBNP (last 3 results) No results for input(s): PROBNP in the last 8760 hours.  CBG: Recent Labs  Lab 12/11/18 0048 12/11/18 0416 12/11/18 1300 12/11/18 1637 12/11/18 2037  GLUCAP 143* 89 101* 117* 95       Signed:  Kayleen Memos, MD [facility] 12/15/2018, 11:30 AM

## 2018-12-15 NOTE — Discharge Instructions (Signed)
Back Injury Prevention Back injuries can be very painful. They can also be difficult to heal. After having one back injury, you are more likely to have another one again. It is important to learn how to avoid injuring or re-injuring your back. The following tips can help you to prevent a back injury. What actions can I take to prevent back injuries? Changes in your diet Talk with your doctor about what to eat. Some foods can make the bones strong.  Talk with your doctor about how much calcium and vitamin D you need each day. These nutrients help to prevent weakening of the bones (osteoporosis).  Eat foods that have calcium. These include: ? Dairy products. ? Green leafy vegetables. ? Food and drinks that have had calcium added to them (fortified).  Eat foods that have vitamin D. These include: ? Milk. ? Food and drinks that have had vitamin D added to them.  Take other supplements and vitamins only as told by your doctor. Physical fitness Physical fitness makes your bones and muscles strong. It also improves your balance and strength.  Exercise for 30 minutes per day on most days of the week, or as told by your doctor. Make sure to: ? Do aerobic exercises, such as walking, jogging, biking, or swimming. ? Do exercises that increase balance and strength, such as tai chi and yoga. ? Do stretching exercises. This helps with flexibility. ? Develop strong belly (abdominal) muscles. Your belly muscles help to support your back.  Stay at a healthy weight. This lowers your risk of a back injury. Good posture        Prevent back injuries by developing and maintaining a good posture. To do this:  Sit up straight and stand up straight. Avoid leaning forward when you sit or hunching over when you stand.  Choose chairs that have good low-back (lumbar) support.  If you work at a desk: ? Sit close to it so you do not need to lean over. ? Keep your chin tucked in. ? Keep your neck drawn  back. ? Keep your elbows bent so that your arms make a corner (right angle).  When you drive: ? Sit high and close to the steering wheel. Add a low-back support to your car seat, if needed. ? Take breaks every hour if you are driving for long periods of time.  Avoid sitting or standing in one position for very long. Take breaks to get up, stretch, and walk around at least once every hour.  Sleep on your side with your knees slightly bent, or sleep on your back with a pillow under your knees.  Lifting, twisting, and reaching   Heavy lifting ? Avoid heavy lifting, especially lifting over and over again. If you must do heavy lifting: ? Stretch before lifting. ? Work slowly. ? Rest between lifts. ? Use a tool such as a cart or a dolly to move objects if one is available. ? Make several small trips instead of carrying one heavy load. ? Ask for help when you need it, especially when moving big objects. ? Follow these steps when lifting: ? Stand with your feet shoulder-width apart. ? Get as close to the object as you can. Do not pick up a heavy object that is far from your body. ? Use handles or lifting straps if they are available. ? Bend at your knees. Squat down, but keep your heels off the floor. ? Keep your shoulders back. Keep your chin tucked in. Keep  your back straight. ? Lift the object slowly while you tighten the muscles in your legs, belly, and bottom. Keep the object as close to the center of your body as possible. ? Follow these steps when putting down a heavy load: ? Stand with your feet shoulder-width apart. ? Lower the object slowly while you tighten the muscles in your legs, belly, and bottom. Keep the object as close to the center of your body as possible. ? Keep your shoulders back. Keep your chin tucked in. Keep your back straight. ? Bend at your knees. Squat down, but keep your heels off the floor. ? Use handles or lifting straps if they are available.  Twisting and  reaching ? Avoid lifting heavy objects above your waist. ? Do not twist at your waist while you are lifting or carrying a load. If you need to turn, move your feet. ? Do not bend over without bending at your knees. ? Avoid reaching over your head, across a table, or for an object on a high surface. Other things to do   Avoid wet floors and icy ground. Keep sidewalks clear of ice to prevent falls.  Do not sleep on a mattress that is too soft or too hard.  Store heavier objects on shelves at waist level.  Store lighter objects on lower or higher shelves.  Find ways to lower your stress, such as: ? Exercise. ? Massage. ? Relaxation techniques.  Talk with your doctor if you feel anxious or depressed. These conditions can make back pain worse.  Wear flat heel shoes with cushioned soles.  Use both shoulder straps when carrying a backpack.  Do not use any products that contain nicotine or tobacco, such as cigarettes and e-cigarettes. If you need help quitting, ask your doctor. Summary  Back injuries can be very painful and difficult to heal.  You can keep your back healthy by making certain changes. These include eating foods that make bones strong, working on being physically fit, developing a good posture, and lifting heavy objects in a safe way. This information is not intended to replace advice given to you by your health care provider. Make sure you discuss any questions you have with your health care provider. Document Released: 12/02/2007 Document Revised: 08/06/2017 Document Reviewed: 08/06/2017 Elsevier Interactive Patient Education  2019 [place].   Musculoskeletal Pain Musculoskeletal pain refers to aches and pains in your bones, joints, muscles, and the tissues that surround them. This pain can occur in any part of the body. It can last for a short time (acute) or a long time (chronic). A physical exam, lab tests, and imaging studies may be done to find the cause of  your musculoskeletal pain. Follow these instructions at home:  Lifestyle  Try to control or lower your stress levels. Stress increases muscle tension and can worsen musculoskeletal pain. It is important to recognize when you are anxious or stressed and learn ways to manage it. This may include: ? Meditation or yoga. ? Cognitive or behavioral therapy. ? Acupuncture or massage therapy.  You may continue all activities unless the activities cause more pain. When the pain gets better, slowly resume your normal activities. Gradually increase the intensity and duration of your activities or exercise. Managing pain, stiffness, and swelling  Take over-the-counter and prescription medicines only as told by your health care provider.  When your pain is severe, bed rest may be helpful. Lie or sit in any position that is comfortable, but get out of  bed and walk around at least every couple of hours.  If directed, apply heat to the affected area as often as told by your health care provider. Use the heat source that your health care provider recommends, such as a moist heat pack or a heating pad. ? Place a towel between your skin and the heat source. ? Leave the heat on for 20-30 minutes. ? Remove the heat if your skin turns bright red. This is especially important if you are unable to feel pain, heat, or cold. You may have a greater risk of getting burned.  If directed, put ice on the painful area. ? Put ice in a plastic bag. ? Place a towel between your skin and the bag. ? Leave the ice on for 20 minutes, 2-3 times a day. General instructions  Your health care provider may recommend that you see a physical therapist. This person can help you come up with a safe exercise program. Do any exercises as told by your physical therapist.  Keep all follow-up visits, including any physical therapy visits, as told by your health care providers. This is important. Contact a health care provider if:  Your  pain gets worse.  Medicines do not help ease your pain.  You cannot use the part of your body that hurts, such as your arm, leg, or neck.  You have trouble sleeping.  You have trouble doing your normal activities. Get help right away if:  You have a new injury and your pain is worse or different.  You feel numb or you have tingling in the painful area. Summary  Musculoskeletal pain refers to aches and pains in your bones, joints, muscles, and the tissues that surround them.  This pain can occur in any part of the body.  Your health care provider may recommend that you see a physical therapist. This person can help you come up with a safe exercise program. Do any exercises as told by your physical therapist.  Lower your stress level. Stress can worsen musculoskeletal pain. Ways to lower stress may include meditation, yoga, cognitive or behavioral therapy, acupuncture, and massage therapy. This information is not intended to replace advice given to you by your health care provider. Make sure you discuss any questions you have with your health care provider. Document Released: 06/15/2005 Document Revised: 07/15/2016 Document Reviewed: 07/15/2016 Elsevier Interactive Patient Education  2019 [place].   Spinal Compression Fracture  A spinal compression fracture is a collapse of the bones that form the spine (vertebrae). With this type of fracture, the vertebrae become pushed (compressed) into a wedge shape. Most compression fractures happen in the middle or lower part of the spine. What are the causes? This condition may be caused by:  Thinning and loss of density in the bones (osteoporosis). This is the most common cause.  A fall.  A car or motorcycle accident.  Cancer.  Trauma, such as a heavy, direct hit to the head or back. What increases the risk? You are more likely to develop this condition if:  You are 19 years or older.  You have osteoporosis.  You have  certain types of cancer, including: ? Multiple myeloma. ? Lymphoma. ? Prostate cancer. ? Lung cancer. ? Breast cancer. What are the signs or symptoms? Symptoms of this condition include:  Severe pain.  Pain that gets worse over time.  Pain that is worse when you stand, walk, sit, or bend.  Sudden pain that is so bad that it is  hard for you to move.  Bending or humping of the spine.  Gradual loss of height.  Numbness, tingling, or weakness in the back and legs.  Trouble walking. Your symptoms will depend on the cause of the fracture and how quickly it develops. How is this diagnosed? This condition may be diagnosed based on symptoms, medical history, and a physical exam. During the physical exam, your health care provider may tap along the length of your spine to check for tenderness. Tests may be done to confirm the diagnosis. They may include:  A bone mineral density test to check for osteoporosis.  Imaging tests, such as a spine X-ray, CT scan, or MRI. How is this treated? Treatment for this condition depends on the cause and severity of the condition. Some fractures may heal on their own with supportive care. Treatment may include:  Pain medicine.  Rest.  A back brace.  Physical therapy exercises.  Medicine to strengthen bone.  Calcium and vitamin D supplements. Fractures that cause the back to become misshapen, cause nerve pain or weakness, or do not respond to other treatment may be treated with surgery. This may include:  Vertebroplasty. Bone cement is injected into the collapsed vertebrae to stabilize them.  Balloon kyphoplasty. The collapsed vertebrae are expanded with a balloon and then bone cement is injected into them.  Spinal fusion. The collapsed vertebrae are connected (fused) to normal vertebrae. Follow these instructions at home: Medicines  Take over-the-counter and prescription medicines only as told by your health care provider.  Do not drive  or operate heavy machinery while taking prescription pain medicine.  If you are taking prescription pain medicine, take actions to prevent or treat constipation. Your health care provider may recommend that you: ? Drink enough fluid to keep your urine pale yellow. ? Eat foods that are high in fiber, such as fresh fruits and vegetables, whole grains, and beans. ? Limit foods that are high in fat and processed sugars, such as fried or sweet foods. ? Take an over-the-counter or prescription medicine for constipation. If you have a brace:  Wear the brace as told by your health care provider. Remove it only as told by your health care provider.  Loosen the brace if your fingers or toes tingle, become numb, or turn cold and blue.  Keep the brace clean.  If the brace is not waterproof: ? Do not let it get wet. ? Cover it with a watertight covering when you take a bath or a shower. Managing pain, stiffness, and swelling   If directed, apply ice to the injured area: ? If you have a removable brace, remove it as told by your health care provider. ? Put ice in a plastic bag. ? Place a towel between your skin and the bag. ? Leave the ice on for 30 minutes every two hours at first. Then apply the ice as needed. Activity  Rest as told by your health care provider. ? Avoid sitting for a long time without moving. Get up to take short walks every 1-2 hours. This is important to improve blood flow and breathing. Ask for help if you feel weak or unsteady.  Return to your normal activities as directed by your health care provider. Ask what activities are safe for you.  Do exercises to improve motion and strength in your back (physical therapy), as recommended by your health care provider.  Exercise regularly as directed by your health care provider. General instructions   Do not drink  alcohol. Alcohol can interfere with your treatment.  Do not use any products that contain nicotine or tobacco,  such as cigarettes and e-cigarettes. These can delay bone healing. If you need help quitting, ask your health care provider.  Keep all follow-up visits as told by your health care provider. This is important. It can help to prevent permanent injury, disability, and long-lasting (chronic) pain. Contact a health care provider if:  You have a fever.  You develop a cough that makes your pain worse.  Your pain medicine is not helping.  Your pain does not get better over time.  You cannot return to your normal activities as planned or expected. Get help right away if:  Your pain is very bad and it suddenly gets worse.  You are unable to move any body part (paralysis) that is below the level of your injury.  You have numbness, tingling, or weakness in any body part that is below the level of your injury.  You cannot control your bladder or bowels. Summary  A spinal compression fracture is a collapse of the bones that form the spine (vertebrae).  With this type of fracture, the vertebrae become pushed (compressed) into a wedge shape.  Your symptoms and treatment will depend on the cause and severity of the fracture and how quickly it develops.  Some fractures may heal on their own with supportive care. Fractures that cause the back to become misshapen, cause nerve pain or weakness, or do not respond to other treatment may be treated with surgery. This information is not intended to replace advice given to you by your health care provider. Make sure you discuss any questions you have with your health care provider. Document Released: 06/15/2005 Document Revised: 07/27/2017 Document Reviewed: 07/27/2017 Elsevier Interactive Patient Education  2019 [employer].   Rhabdomyolysis Rhabdomyolysis is a condition that happens when muscle cells break down and release substances into the blood that can damage the kidneys. This condition happens because of damage to the muscles that move bones  (skeletal muscle). When the skeletal muscles are damaged, substances inside the muscle cells go into the blood. One of these substances is a protein called myoglobin. Large amounts of myoglobin can cause kidney damage or kidney failure. Other substances that are released by muscle cells may upset the balance of the minerals (electrolytes) in your blood. This imbalance causes your blood to have too much acid (acidosis). What are the causes? This condition is caused by muscle damage. Muscle damage often happens because of:  Using your muscles too much.  An injury that crushes or squeezes a muscle too tightly.  Using illegal drugs, mainly cocaine.  Alcohol abuse. Other possible causes include:  Prescription medicines, such as those that: ? Lower cholesterol (statins). ? Treat ADHD (attention deficit hyperactivity disorder) or help with weight loss (amphetamines). ? Treat pain (opiates).  Infections.  Muscle diseases that are passed down from parent to child (inherited).  High fever.  Heatstroke.  Not having enough fluids in your body (dehydration).  Seizures.  Surgery. What increases the risk? This condition is more likely to develop in people who:  Have a family history of muscle disease.  Take part in extreme sports, such as running in marathons.  Have diabetes.  Are older.  Abuse drugs or alcohol. What are the signs or symptoms? Symptoms of this condition vary. Some people have very few symptoms, and other people have many symptoms. The most common symptoms include:  Muscle pain and swelling.  Weak muscles.  Dark urine.  Feeling weak and tired. Other symptoms include:  Nausea and vomiting.  Fever.  Pain in the abdomen.  Pain in the joints. Symptoms of complications from this condition include:  Heart rhythm that is not normal (arrhythmia).  Seizures.  Not urinating enough because of kidney failure.  Very low blood pressure (shock). Signs of shock  include dizziness, blurry vision, and clammy skin.  Bleeding that is hard to stop or control. How is this diagnosed? This condition is diagnosed based on your medical history, your symptoms, and a physical exam. Tests may also be done, including:  Blood tests.  Urine tests to check for myoglobin. You may also have other tests to check for causes of muscle damage and to check for complications. How is this treated? Treatment for this condition helps to:  Make sure you have enough fluids in your body.  Lower the acid levels in your blood to reverse acidosis.  Protect your kidneys. Treatment may include:  Fluids and medicines given through an IV tube that is inserted into one of your veins.  Medicines to lower acidosis or to bring back the balance of the minerals in your body.  Hemodialysis. This treatment uses an artificial kidney machine to filter your blood while you recover. You may have this if other treatments are not helping. Follow these instructions at home:   Take over-the-counter and prescription medicines only as told by your health care provider.  Rest at home until your health care provider says that you can return to your normal activities.  Drink enough fluid to keep your urine clear or pale yellow.  Do not do activities that take a lot of effort (are strenuous). Ask your health care provider what level of exercise is safe for you.  Do not abuse drugs or alcohol. If you are having problems with drug or alcohol use, ask your health care provider for help.  Keep all follow-up visits as told by your health care provider. This is important. Contact a health care provider if:  You start having symptoms of this condition after treatment. Get help right away if:  You have a seizure.  You bleed easily or cannot control bleeding.  You cannot urinate.  You have chest pain.  You have trouble breathing. This information is not intended to replace advice given to  you by your health care provider. Make sure you discuss any questions you have with your health care provider. Document Released: 05/28/2004 Document Revised: 03/27/2016 Document Reviewed: 03/27/2016 Elsevier Interactive Patient Education  2019 [place].   Thoracic Spine Fracture A thoracic spine fracture is a break in one of the bones of the middle part of the back. Thoracic spine fractures can vary from mild to severe. The most severe types are those that:  Cause the broken bones to move out of place (unstable).  Injure or press on the spinal cord. In some cases, the bone that connects to the lower part of the back may also have a break (thoracolumbar fracture). What are the causes? This condition may be caused by:  A motor vehicle collision.  A fall or jump from a great height.  A collision during sports.  Violent acts, such as an assault or gunshot. What increases the risk? You are more likely to develop this condition if:  You have osteoporosis.  You play contact sports.  You are in a situation that could result in a fall or other violent injury. What are the  signs or symptoms? Symptoms of this injury depend on the type of fracture. The main symptom of this condition is back pain that gets worse with movement. Other symptoms include:  Trouble standing or walking.  Numbness.  Tingling.  Weakness.  Loss of movement.  Loss of bowel or bladder control (incontinence). How is this diagnosed? This condition may be diagnosed based on:  Your symptoms and medical history.  A physical exam.  Imaging tests, such as: ? X-rays. ? CT scan. ? MRI. How is this treated? Treatment for this injury depends on the type of fracture. If your fracture is stable and does not affect your spinal cord, it may heal with treatments such as:  Medicines for pain.  A cast or a brace.  Physical therapy. If your fracture is unstable or if it affects your spinal cord, you may  need surgery. Follow these instructions at home: Medicines  Take over-the-counter and prescription medicines only as told by your health care provider.  Do not drive or use heavy machinery while taking pain medicine.  To prevent or treat constipation while you are taking prescription pain medicine, your health care provider may recommend that you: ? Drink enough fluid to keep your urine pale yellow. ? Take over-the-counter or prescription medicines. ? Eat foods that are high in fiber, such as fresh fruits and vegetables, whole grains, and beans. ? Limit foods that are high in fat and processed sugars, such as fried or sweet foods. If you have a brace:  Wear the brace as told by your health care provider. Remove it only as told by your health care provider.  Keep the brace clean.  If the brace is not waterproof: ? Do not let it get wet. ? Cover it with a watertight covering when you take a bath or a shower. Activity  Stay in bed (on bed rest) only as directed by your health care provider. Being on bed rest for too long can make your condition worse.  Return to your normal activities as directed by your health care provider. Ask what activities are safe for you.  Do exercises to improve motion and strength in your back (physical therapy), as recommended by your health care provider.  Exercise regularly as told by your health care provider. Managing pain, stiffness, and swelling   If directed, apply ice to the injured area: ? Put ice in a plastic bag. ? Place a towel between your skin and the bag. ? Leave the ice on for 20 minutes, 2-3 times a day. General instructions  Do not use any products that contain nicotine or tobacco, such as cigarettes and e-cigarettes. These can delay healing after injury. If you need help quitting, ask your health care provider.  Do not drink alcohol. Alcohol can interfere with your treatment.  Keep all follow-up visits as directed by your health  care provider. This is important. It can help to prevent permanent injury, disability, and long-lasting (chronic) pain. Contact a health care provider if:  You have a fever.  You develop a cough that makes your pain worse.  Your pain medicine is not helping.  Your pain does not get better over time.  You cannot return to your normal activities as planned or expected. Get help right away if:  Your pain is very bad and it suddenly gets worse.  You are unable to move any part of your body (paralysis) that is below the level of your injury.  You have numbness, tingling, or weakness  in any part of your body that is below the level of your injury.  You cannot control your bladder or bowels. Summary  A thoracic spine fracture is a break in one of the bones of the middle part of the back.  Treatment for this injury depends on the type of fracture.  A stable fracture can be treated with a back brace, activity restrictions, pain medicine, and physical therapy. A more severe break may require surgery.  Make sure you know what symptoms should cause you to get help right away. This information is not intended to replace advice given to you by your health care provider. Make sure you discuss any questions you have with your health care provider. Document Released: 07/23/2004 Document Revised: 07/30/2017 Document Reviewed: 07/30/2017 Elsevier Interactive Patient Education  2019 [employer].

## 2018-12-15 NOTE — Progress Notes (Signed)
Physical Therapy Treatment Patient Details Name: Donald Jenkins MRN: [id number] DOB: 1970/09/29 Today's Date: 12/15/2018    History of Present Illness Pt is a 48 y/o male admitted after being found on the floor of his home. Reporting increased back pain. PT with rhabdomyolysis and T8 compression fx. Pt is now s/p kyphoplasty.  PMH includes HTN, anxiety, and hypersolomnence disorder.    PT Comments    PT saw pt over mult questions to OT staff member that were directly relevant to MD instructions and had been previously covered in PT sessions preceding.  Pt is going to home today, will be referred to rehab if needed by PCP, has no ortho to follow up.  Repeatedly told pt that PT was not going to increase his lifting limit in 2 weeks as he asked many times, but that he should refer to PCP for instructions.     Follow Up Recommendations  No PT follow up;Other (comment)(pt aware MD will refer when appropriate)     Equipment Recommendations  None recommended by PT    Recommendations for Other Services       Precautions / Restrictions Precautions Precautions: Back Precaution Booklet Issued: Yes (comment) Precaution Comments: pt recalls most instructions Restrictions Weight Bearing Restrictions: No    Mobility  Bed Mobility Overal bed mobility: Independent                Transfers Overall transfer level: Independent                  Ambulation/Gait                 Stairs             Wheelchair Mobility    Modified Rankin (Stroke Patients Only)       Balance                                            Cognition     Overall Cognitive Status: No family/caregiver present to determine baseline cognitive functioning Area of Impairment: Safety/judgement;Awareness;Problem solving                     Memory: Decreased recall of precautions;Decreased short-term memory   Safety/Judgement: Decreased awareness of  safety;Decreased awareness of deficits Awareness: Emergent;Intellectual Problem Solving: Slow processing General Comments: repetitive instructions for pt to take in the information      Exercises      General Comments General comments (skin integrity, edema, etc.): lengthy conversation about pt questions about limitations and where to get information.  He was repeatedly told PT does not give permission to lift or return to work, and that he will get information from MD here to transfer to his PCP given that orthopedics did not see him      Pertinent Vitals/Pain Pain Assessment: Faces Faces Pain Scale: Hurts a little bit Pain Location: back with movement Pain Descriptors / Indicators: [place]                      Prior Function            PT Goals (current goals can now be found in the care plan section) Acute Rehab PT Goals Patient Stated Goal: go back to work and lift 60# Progress towards PT goals: Progressing toward goals  Frequency    Min 3X/week      PT Plan Current plan remains appropriate    Co-evaluation              AM-PAC PT "6 Clicks" Mobility   Outcome Measure  Help needed turning from your back to your side while in a flat bed without using bedrails?: A Little Help needed moving from lying on your back to sitting on the side of a flat bed without using bedrails?: A Little Help needed moving to and from a bed to a chair (including a wheelchair)?: None Help needed standing up from a chair using your arms (e.g., wheelchair or bedside chair)?: None Help needed to walk in hospital room?: None Help needed climbing 3-5 steps with a railing? : None 6 Click Score: 22    End of Session         PT Visit Diagnosis: Pain;Other abnormalities of gait and mobility (R26.89) Pain - part of body: (spine)     Time: 3664-4034 PT Time Calculation (min) (ACUTE ONLY): 25 min  Charges:  $Therapeutic Activity: 23-37 mins                     Donald Jenkins 12/15/2018, 3:28 PM  Donald Jenkins, PT MS Acute Rehab Dept. Number: [place] [facility] [place]

## 2018-12-15 NOTE — Plan of Care (Signed)
  Problem: Clinical Measurements: Goal: Will remain free from infection Outcome: Progressing   Problem: Activity: Goal: Risk for activity intolerance will decrease Outcome: Progressing   Problem: Safety: Goal: Ability to remain free from injury will improve Outcome: Progressing   Problem: Activity: Goal: Capacity to carry out activities will improve Outcome: Progressing   

## 2018-12-15 NOTE — Telephone Encounter (Signed)
[employer] to ship 30 day cardiac event monitor to his home.  Instructions reviewed briefly as they are included in the monitor kit. 

## 2018-12-15 NOTE — Progress Notes (Signed)
AVS reviewed with patient.  All questions and concerns answered with patient verbalizing understanding.  Script given for pain medication.  Patient escorted off unit via wheel chair by nurse tech

## 2018-12-15 NOTE — Progress Notes (Signed)
Occupational Therapy Treatment Patient Details Name: Donald Jenkins MRN: [id number] DOB: 30-Nov-1970 Today's Date: 12/15/2018    History of present illness Pt is a 48 y/o male admitted after being found on the floor of his home. Reporting increased back pain. PT with rhabdomyolysis and T8 compression fx. Pt is now s/p kyphoplasty.  PMH includes HTN, anxiety, and hypersolomnence disorder.   OT comments  Patient able to verbalize back precautions independently this session with handout provided for reference. Demonstrated back precautions during ADL activities. Patient concerned about his back, how it was injured, his 2 weeks of back precautions and what he should do to strengthen his back and prevent injury. Communicated with PT staff his concerns and wishes for a HEP for when he off his back precautions. PT to address. At this time, patient has met all OT goals and all education has been completed. No further needs at this time.     Follow Up Recommendations  No OT follow up          Precautions / Restrictions Precautions Precautions: Back Precaution Booklet Issued: Yes (comment) Precaution Comments: Provided patient with back precautions handout Restrictions Weight Bearing Restrictions: No       Mobility Bed Mobility Overal bed mobility: Independent     Sidelying to sit: Modified independent (Device/Increase time)     Sit to sidelying: Modified independent (Device/Increase time) General bed mobility comments: Pt able to follow back precautions during sessions.  Transfers Overall transfer level: Independent Equipment used: None Transfers: Sit to/from Stand Sit to Stand: Independent                  ADL either performed or assessed with clinical judgement   ADL Overall ADL's : Modified independent;Independent             Lower Body Bathing: Modified independent;Adhering to back precautions;Sitting/lateral leans(sitting on EOB)       Lower Body  Dressing: Modified independent;Sit to/from stand;Adhering to back precautions   Toilet Transfer: Independent(following back precautions)       Tub/ Shower Transfer: Independent;Adhering to back precautions(simulated. patient able to follow back precautions)   Functional mobility during ADLs: Independent General ADL Comments: Pt was able to verbalize back precautions during session independently.               Cognition Arousal/Alertness: Awake/alert Behavior During Therapy: WFL for tasks assessed/performed Overall Cognitive Status: Within Functional Limits for tasks assessed                       Pertinent Vitals/ Pain       Pain Assessment: Faces Faces Pain Scale: Hurts a little bit Pain Location: back with movement Pain Descriptors / Indicators: Aching Pain Intervention(s): Monitored during session      Progress Toward Goals  OT Goals(current goals can now be found in the care plan section)  Progress towards OT goals: Goals met/education completed, patient discharged from [place] All goals met and education completed, patient discharged from OT services       AM-PAC OT "6 Clicks" Daily Activity     Outcome Measure   Help from another person eating meals?: None Help from another person taking care of personal grooming?: None Help from another person toileting, which includes using toliet, bedpan, or urinal?: None Help from another person bathing (including washing, rinsing, drying)?: None Help from another person to put on and taking off regular upper body clothing?: None Help from  another person to put on and taking off regular lower body clothing?: None 6 Click Score: 24    End of Session    OT Visit Diagnosis: Pain   Activity Tolerance Patient tolerated treatment well   Patient Left in bed;with call bell/phone within reach   Nurse Communication          Time: 5295-5397 OT Time Calculation (min): 15 min  Charges: OT General  Charges $OT Visit: 1 Visit OT Treatments $Self Care/Home Management : 8-22 mins(15[place], OTR/L,CBIS  [phone]   [place]her, Clarene Duke 12/15/2018, 10:14 AM

## 2018-12-16 ENCOUNTER — Telehealth: Payer: Self-pay | Admitting: *Deleted

## 2018-12-16 ENCOUNTER — Other Ambulatory Visit (HOSPITAL_COMMUNITY): Payer: Self-pay | Admitting: Interventional Radiology

## 2018-12-16 DIAGNOSIS — S22060A Wedge compression fracture of T7-T8 vertebra, initial encounter for closed fracture: Secondary | ICD-10-CM

## 2018-12-16 LAB — CULTURE, BLOOD (ROUTINE X 2)
Culture: NO GROWTH
Culture: NO GROWTH
Special Requests: ADEQUATE

## 2018-12-16 NOTE — Telephone Encounter (Signed)
Reviewed the equipment that will be in his monitor kit .  Explained how to apply and turn on the monitor.  The cell phone included in the kit will prompt you if an action is needed, ie. Low monitor battery.  Patient had basic understanding of monitor at end of conversation.  Encouraged to read instruction booklet when monitor arrives.

## 2018-12-16 NOTE — Telephone Encounter (Signed)
New Message     Pt is calling with questions about the Monitor. How to wear it and does he wear it all the time...etc.    Please call

## 2018-12-19 ENCOUNTER — Telehealth: Payer: Self-pay | Admitting: Cardiology

## 2018-12-19 NOTE — Telephone Encounter (Signed)
Patient was enrolled for his cardiac event monitor on 12/15/2018.  Explained to patient it might be a couple more days until he receives it from [facility].   Patient requesting changes in his pain medication that was prescribed by Hospitalist at discharge.  Explained to patient post discharge, medications would be managed by PCP.  Patient has appointment 12/22/18 with [facility].

## 2018-12-19 NOTE — Telephone Encounter (Signed)
New Message            Patient is needing a call back concerning his monitor. ([place])

## 2018-12-20 LAB — SULFONYLUREA HYPOGLYCEMICS PANEL, SERUM
Acetohexamide: NEGATIVE ug/mL (ref 20–60)
Chlorpropamide: NEGATIVE ug/mL (ref 75–250)
Glimepiride: NEGATIVE ng/mL (ref 80–250)
Glipizide: NEGATIVE ng/mL (ref 200–1000)
Glyburide: NEGATIVE ng/mL
Nateglinide: NEGATIVE ng/mL
Repaglinide: NEGATIVE ng/mL
Tolazamide: NEGATIVE ug/mL
Tolbutamide: NEGATIVE ug/mL (ref 40–100)

## 2018-12-21 NOTE — Progress Notes (Signed)
Patient ID: Donald Jenkins, male   DOB: 04/26/1971, 48 y.o.   MRN: [id number] Virtual Visit via Telephone Note  I connected with [name] on 12/22/18 at  3:10 PM EDT by telephone and verified that I am speaking with the correct person using two identifiers.   I discussed the limitations, risks, security and privacy concerns of performing an evaluation and management service by telephone and the availability of in person appointments. I also discussed with the patient that there may be a patient responsible charge related to this service. The patient expressed understanding and agreed to proceed.  Patient location:  home My Location:  [place] office Persons on the call:  Me and the patient.   History of Present Illness:   After hospitalization 6/10-6/18/2020 for traumatic rhabdomyolysis after a fall and compression fracture.  He is complaining of severe pain that "oxycodone" isn't touching it and needs "something stronger."  Had follow up with Interventional radiology today.  Overall body aches have improved.  Appetite is good.  No abdominal pain.  No fever.   From discharge summary: History of present illness:  48 year old male with history of hypertension, anxiety,hypersomnicdisorder who was admitted after being found on the floor of his home, reported increased back pain. In the ER significant rhabdomyolysis for more than 50,000 CK and T8 compression fracture. Patient reports he has had back pain and gets flareups intermittently, worsefor 1 week. In ER,CK >50,000,alcohol less than 10, pending COVID-19 test, UDS positive for THC,troponin 0.05, lactic acid 1.5, WBC 23.5, urinalysis negative, Tylenol less than 10, salicylate level less than 7, abnormal liver function (ALP 68, AST 684, ALT 140, total bilirubin 1.2), AKI with creatinine 2.80, BUN 55), chest x-ray negative. Patient is placed on telemetry bed. Negative covid 19 test. Patient was admitted, treated with aggressive IV  hydration, was placed on antibiotics blood culture came back positive suspected to be contamination. Treated for rhabdomyolysis, acute renal failure and mild LFT which have all significantly improved.  Bone biopsy of T8 bone no evidence of malignancy.  Hospital Course:  Principal Problem:   Rhabdomyolysis Active Problems:   Essential hypertension, benign   Hypersomnolence disorder, with mental disorder, acute, moderate   SIRS (systemic inflammatory response syndrome) (HCC)   Back pain   Depression with anxiety   Abnormal LFTs   AKI (acute kidney injury) ([place])   Elevated troponin   Acute metabolic encephalopathy   Metabolic acidosis   Compression fracture of body of thoracic vertebra (HCC)   Hypoglycemia   Bacteremia   Fall   Hypomagnesemia  SevereRhabdomyolysis CK more than 50,000.Suspect due to prolonged immobilization. CPK continues to improve down to 500 range. Continue to encourage oral fluid intake.  Avoid dehydration. Follow-up with your PCP.  Acute on chronic back pain with imaging showing acute T8 compression fracture status post kyphoplasty with biopsy by interventional radiology on 12/13/2018. Back pain has significantly improved from 10 out of 10 to 6 out of 10 today. Instructed in the room not to lift more than 10 pounds x 2 weeks and not to drive x2 weeks.   Strepbacteremia on 12/08/18/SIRS.Informed by Junie Panning from ID, recommend stopping antibiotics asstrep inonly1outof 3 blood culture likely contamination, no obvious infectious source identified. His blood culture Repeat 6/14 NGTD  Essential hypertension,BP is normotensive. C/w holdingLISINOPRIL/HCTZ BP remains stable.  Hypersomnolence disorder, with mental disorder, acute, moderate/Depression with anxiety:Mood is stable.Continue home meds.  Abnormal LFTs:Suspect due to rhabdomyolysis, improved. Hepatitis panel negative.  Resolved AKIlikely due to rhabdomyolysis Avoid  dehydration  Elevated troponinmild:Denies chest painlikely in the setting of rhabdomyolysis.Peaked at 0.06 and trendeddown.  ResolvedAcute metabolic encephalopathy:Likely multifactorial,CT head and C-spine no acute intracranial finding. Patient has been not taking any Xanax for the last 2 weeks and was started on vitamin a month ago, has been on buspirone for a long time. At this time overall mental status seems to have improved. Still has some poor memory and appears somewhat forgetful-stable baseline.  At this time advised outpatient follow-up.Referredto outpatient cardiac monitor for 30 days.message was sent to cardiology to review by Dr Erlinda Hong.  Observations/Objective: A&Ox3.  Sounds angry/aggressive   Assessment and Plan: 1. Traumatic rhabdomyolysis, initial encounter ([place]) improving - CK; Future - CBC with Differential/Platelet; Future -hydration imperative  2. Hypomagnesemia - Magnesium; Future  3. Hospital follow-up -improving  4. Acute midline thoracic back pain Unable to Rx oxycodone through our clinic and can't give anything stronger if that isn't helping.  To ED if pain is unmanageable.  Starts PT next week - cyclobenzaprine (FLEXERIL) 5 MG tablet; Take 1 tablet (5 mg total) by mouth 3 (three) times daily as needed for muscle spasms. X 7days  Dispense: 40 tablet; Refill: 1 - ibuprofen (ADVIL) 800 MG tablet; Take 1 tablet (800 mg total) by mouth every 8 (eight) hours as needed. X7 days then prn  Dispense: 60 tablet; Refill: 0  5. Essential hypertension, benign Continue amlodipine - Comprehensive metabolic panel; Future - CBC with Differential/Platelet; Future  6. Abnormal LFTs -check CMP next week  7. AKI (acute kidney injury) (HCC)  - CK; Future - Comprehensive metabolic panel; Future   Follow Up Instructions: Lab appt 1 week;  Assign PCP 1 month   I discussed the assessment and treatment plan with the patient. The patient was provided an  opportunity to ask questions and all were answered. The patient agreed with the plan and demonstrated an understanding of the instructions.   The patient was advised to call back or seek an in-person evaluation if the symptoms worsen or if the condition fails to improve as anticipated.  I provided 14 minutes of non-face-to-face time during this encounter.   Freeman Caldron, PA-C

## 2018-12-22 ENCOUNTER — Other Ambulatory Visit: Payer: Self-pay

## 2018-12-22 ENCOUNTER — Ambulatory Visit (HOSPITAL_COMMUNITY)
Admission: RE | Admit: 2018-12-22 | Discharge: 2018-12-22 | Disposition: A | Discharge status: Home / Self Care | Payer: BC Managed Care – PPO | Source: Ambulatory Visit | Attending: Interventional Radiology | Admitting: Interventional Radiology

## 2018-12-22 ENCOUNTER — Ambulatory Visit: Payer: BC Managed Care – PPO | Attending: Family Medicine | Admitting: Physician Assistant

## 2018-12-22 DIAGNOSIS — N179 Acute kidney failure, unspecified: Secondary | ICD-10-CM

## 2018-12-22 DIAGNOSIS — I10 Essential (primary) hypertension: Secondary | ICD-10-CM

## 2018-12-22 DIAGNOSIS — T796XXA Traumatic ischemia of muscle, initial encounter: Secondary | ICD-10-CM | POA: Diagnosis not present

## 2018-12-22 DIAGNOSIS — S22060A Wedge compression fracture of T7-T8 vertebra, initial encounter for closed fracture: Secondary | ICD-10-CM

## 2018-12-22 DIAGNOSIS — R7989 Other specified abnormal findings of blood chemistry: Secondary | ICD-10-CM

## 2018-12-22 DIAGNOSIS — R945 Abnormal results of liver function studies: Secondary | ICD-10-CM

## 2018-12-22 DIAGNOSIS — M546 Pain in thoracic spine: Secondary | ICD-10-CM

## 2018-12-22 DIAGNOSIS — Z09 Encounter for follow-up examination after completed treatment for conditions other than malignant neoplasm: Secondary | ICD-10-CM

## 2018-12-22 MED ORDER — IBUPROFEN 800 MG PO TABS: 800.0000 mg | ORAL_TABLET | Freq: Three times a day (TID) | ORAL | 0 refills | Status: DC | PRN

## 2018-12-22 MED ORDER — AMLODIPINE BESYLATE 5 MG PO TABS: 5.0000 mg | ORAL_TABLET | Freq: Every day | ORAL | 1 refills | Status: DC

## 2018-12-22 MED ORDER — CYCLOBENZAPRINE HCL 5 MG PO TABS: 5.0000 mg | ORAL_TABLET | Freq: Three times a day (TID) | ORAL | 1 refills | Status: DC | PRN

## 2018-12-22 NOTE — Progress Notes (Signed)
Chief Complaint: Patient was seen in consultation today for back pain.  Referring Physician(s): Florencia Reasons  Supervising Physician: Luanne Bras  Patient Status: [facility] - Out-pt  History of Present Illness: Donald Jenkins is a 48 y.o. male with a past medical history as below, with pertinent past medical history including hypertension and anxiety. He is known to NIR. He was recently admitted to [facility] 12/07/2018 to 12/15/2018 for management of back pain. He was found to have a T8 compression fracture. During admission, NIR requested by Dr. Erlinda Hong for management of compression fracture. He underwent an image-guided T8 vertebroplasty in IR 12/13/2018 by Dr. Estanislado Pandy while he was admitted. He was discharged home 12/15/2018 in stable condition.  Patient presents today for follow-up due to back pain. Patient awake and alert sitting in chair. Complains of back pain. States that he currently does not have back pain, but does at night. States that prior to procedure, pain was 10/10 and now pain (when it is at its worst) is 5/10. States that laying down improves pain yet sometimes cannot sleep because of pain. States that he takes Oxycodone but "doesn't like how it makes me feel". Requesting muscle relaxer.   Past Medical History:  Diagnosis Date   Anxiety    Hypertension    Tachycardia     Past Surgical History:  Procedure Laterality Date   CHOLECYSTECTOMY N/A 06/05/2018   Procedure: LAPAROSCOPIC CHOLECYSTECTOMY WITH POSSIBLE INTRAOPERATIVE CHOLANGIOGRAM;  Surgeon: Clovis Riley, MD;  Location: [place];  Service: General;  Laterality: N/A;   IR VERTEBROPLASTY CERV/THOR BX INC UNI/BIL INC/INJECT/IMAGING  12/13/2018   KNEE SURGERY      Allergies: Penicillins  Medications: Prior to Admission medications   Medication Sig Start Date End Date Taking? Authorizing Provider  ALPRAZolam Duanne Moron) 1 MG tablet Take 1 tablet (1 mg total) by mouth 3 (three) times daily as needed for anxiety.  11/01/18   Delight Hoh, MD  amLODipine (NORVASC) 5 MG tablet Take 1 tablet (5 mg total) by mouth daily. 12/15/18   Kayleen Memos, DO  aspirin EC 81 MG EC tablet Take 1 tablet (81 mg total) by mouth daily. 12/15/18   Kayleen Memos, DO  methylphenidate (RITALIN) 5 MG tablet Take 1 tablet (5 mg total) by mouth 3 (three) times daily with meals. 11/16/18   Delight Hoh, MD  oxyCODONE (OXY IR/ROXICODONE) 5 MG immediate release tablet Take 1 tablet (5 mg total) by mouth 2 (two) times daily as needed for up to 10 doses for severe pain. 12/15/18   Kayleen Memos, DO     Family History  Problem Relation Age of Onset   Lung cancer Father    Esophageal cancer Father    Brain cancer Father     Social History   Socioeconomic History   Marital status: Single    Spouse name: Not on file   Number of children: 0   Years of education: Not on file   Highest education level: Not on file  Occupational History   Occupation: Arts administrator: [employer]   Financial resource strain: Not hard at all   Food insecurity    Worry: Never true    Inability: Never true   Transportation needs    Medical: No    Non-medical: No  Tobacco Use   Smoking status: Never Smoker   Smokeless tobacco: Never Used  Substance and Sexual Activity   Alcohol use: Yes    Alcohol/week:  0.0 standard drinks    Comment: socially, "once in a blue moon"   Drug use: Yes    Types: Marijuana   Sexual activity: Yes    Partners: Female    Birth control/protection: None  Lifestyle   Physical activity    Days per week: 3 days    Minutes per session: 40 min   Stress: To some extent  Relationships   Social connections    Talks on phone: Once a week    Gets together: Once a week    Attends religious service: Never    Active member of club or organization: No    Attends meetings of clubs or organizations: Never    Relationship status: Never married  Other Topics Concern   Not on  file  Social History Narrative   Programme researcher, 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  All other systems are negative.  Review of Systems  Constitutional: Negative for chills and fever.  Respiratory: Negative for shortness of breath and wheezing.   Cardiovascular: Negative for chest pain and palpitations.  Musculoskeletal: Positive for back pain.     Physical Exam Constitutional:      General: He is not in acute distress.    Appearance: Normal appearance.  Pulmonary:     Effort: Pulmonary effort is normal. No respiratory distress.  Musculoskeletal:     Comments: Mild-moderate pain of midline spine at approximate level of T8.  Skin:    General: Skin is warm and dry.  Neurological:     Mental Status: He is alert and oriented to person, place, and time.      Imaging: Dg Thoracic Spine 2 View  Result Date: 12/08/2018 CLINICAL DATA:  48 year old male status post fall.  Pain. EXAM: THORACIC SPINE 2 VIEWS COMPARISON:  Cervical spine CT today reported separately. Chest radiographs 07/02/2013. FINDINGS: Normal thoracic segmentation. There is a moderate compression fracture of T8 which is new since 2015. At other levels thoracic vertebral height and alignment appears preserved. Cervicothoracic junction alignment is within normal limits. Preserved disc spaces. Posterior ribs appear intact. Negative visible chest and abdominal visceral contours. IMPRESSION: Moderate T8 compression fracture, new since 2015 and likely acute in this clinical setting. If specific therapy is desired, Thoracic MRI without contrast or Nuclear Medicine Whole-body Bone Scan would confirm candidacy for vertebroplasty. Electronically Signed   By: Genevie Ann M.D.   On: 12/08/2018 02:10   Ct Head Wo Contrast  Result Date: 12/08/2018 CLINICAL DATA:  48 year old male status post fall. Altered mental status. EXAM: CT  HEAD WITHOUT CONTRAST CT CERVICAL SPINE WITHOUT CONTRAST TECHNIQUE: Multidetector CT imaging of the head and cervical spine was performed following the standard protocol without intravenous contrast. Multiplanar CT image reconstructions of the cervical spine were also generated. COMPARISON:  None. FINDINGS: CT HEAD FINDINGS Brain: No midline shift, ventriculomegaly, mass effect, evidence of mass lesion, intracranial hemorrhage or evidence of cortically based acute infarction. Gray-white matter differentiation is within normal limits throughout the brain. Vascular: Mild Calcified atherosclerosis at the skull base. No suspicious intracranial vascular hyperdensity. Skull: Negative. Sinuses/Orbits: Visualized paranasal sinuses and mastoids are clear. Other: Visualized orbits and scalp soft tissues are within normal limits. CT CERVICAL SPINE FINDINGS Alignment: Relatively preserved cervical lordosis. Levoconvex cervical spine curvature or scoliosis. Cervicothoracic junction alignment is within normal limits. Bilateral posterior element alignment is within normal limits. Skull base and  vertebrae: Visualized skull base is intact. No atlanto-occipital dissociation. Motion artifact at C4 and C5, with repeat imaging through those levels. No cervical spine fracture identified. Benign appearing sclerosis of the left C4 facet. Normal bone mineralization otherwise. Soft tissues and spinal canal: No prevertebral fluid or swelling. No visible canal hematoma. Calcified carotid atherosclerosis greater on the left. Disc levels: Disc space loss with endplate spurring at E3-P2 and C6-C7. Upper chest: Negative visible upper thoracic levels and lung apices. IMPRESSION: 1. Normal noncontrast CT appearance of the brain. 2. No acute traumatic injury identified in the head or cervical spine. Electronically Signed   By: Genevie Ann M.D.   On: 12/08/2018 02:07   Ct Cervical Spine Wo Contrast  Result Date: 12/08/2018 CLINICAL DATA:  48 year old  male status post fall. Altered mental status. EXAM: CT HEAD WITHOUT CONTRAST CT CERVICAL SPINE WITHOUT CONTRAST TECHNIQUE: Multidetector CT imaging of the head and cervical spine was performed following the standard protocol without intravenous contrast. Multiplanar CT image reconstructions of the cervical spine were also generated. COMPARISON:  None. FINDINGS: CT HEAD FINDINGS Brain: No midline shift, ventriculomegaly, mass effect, evidence of mass lesion, intracranial hemorrhage or evidence of cortically based acute infarction. Gray-white matter differentiation is within normal limits throughout the brain. Vascular: Mild Calcified atherosclerosis at the skull base. No suspicious intracranial vascular hyperdensity. Skull: Negative. Sinuses/Orbits: Visualized paranasal sinuses and mastoids are clear. Other: Visualized orbits and scalp soft tissues are within normal limits. CT CERVICAL SPINE FINDINGS Alignment: Relatively preserved cervical lordosis. Levoconvex cervical spine curvature or scoliosis. Cervicothoracic junction alignment is within normal limits. Bilateral posterior element alignment is within normal limits. Skull base and vertebrae: Visualized skull base is intact. No atlanto-occipital dissociation. Motion artifact at C4 and C5, with repeat imaging through those levels. No cervical spine fracture identified. Benign appearing sclerosis of the left C4 facet. Normal bone mineralization otherwise. Soft tissues and spinal canal: No prevertebral fluid or swelling. No visible canal hematoma. Calcified carotid atherosclerosis greater on the left. Disc levels: Disc space loss with endplate spurring at R5-J8 and C6-C7. Upper chest: Negative visible upper thoracic levels and lung apices. IMPRESSION: 1. Normal noncontrast CT appearance of the brain. 2. No acute traumatic injury identified in the head or cervical spine. Electronically Signed   By: Genevie Ann M.D.   On: 12/08/2018 02:07   Mr Thoracic Spine Wo  Contrast  Result Date: 12/09/2018 CLINICAL DATA:  Initial evaluation for acute mid to upper back pain no known injury. EXAM: MRI THORACIC SPINE WITHOUT CONTRAST TECHNIQUE: Multiplanar, multisequence MR imaging of the thoracic spine was performed. No intravenous contrast was administered. COMPARISON:  Prior radiograph from 12/08/2018. FINDINGS: Alignment: Mild dextroscoliosis. Alignment otherwise normal with preservation of the normal thoracic kyphosis. No listhesis or malalignment. Vertebrae: Acute to subacute appearing compression fracture of the T8 vertebral body with up to 50% central height loss with trace 2 mm bony retropulsion at the inferior posterior cortex. This is benign/mechanical in appearance. Mild height loss at the superior endplates of T4, T5, and T7 with associated Schmorl's node deformities largely chronic in appearance. No other acute or subacute compression fracture. Vertebral body height otherwise maintained. Underlying bone marrow signal intensity within normal limits. Few scattered benign hemangiomas noted, most prominent of which measures 11 mm in the T10 vertebral body. No aggressive or worrisome osseous lesions. Reactive marrow edema present about the right T4-5 facet due to facet arthritis (series 17, image 6). No other abnormal marrow edema. Cord: Signal intensity within the thoracic spinal  cord is normal. Normal cord caliber and morphology. Conus terminates inferior to T12. Paraspinal and other soft tissues: Soft tissue edema present within the upper posterior paraspinous musculature (series 17, images 1, 19), suggesting muscular injury/strain. Paraspinous soft tissues demonstrate no other acute finding. Visualized lungs are largely clear. Visualized visceral structures unremarkable. Disc levels: T1-2:  Unremarkable. T2-3: Unremarkable. T3-4: Mild height loss with endplate Schmorl's node at the superior endplate of T4. Right-sided facet degeneration. Resultant mild to moderate right  foraminal narrowing. No spinal stenosis or left foraminal narrowing. T4-5: Height loss at the superior endplate of T5 with associated Schmorl's node deformity. Right greater than left facet degeneration. Mild right foraminal narrowing. No canal or left foraminal stenosis. T5-6: Mild right-sided facet degeneration. No significant stenosis. T6-7:  Minimal disc bulge.  No stenosis. T7-8:  Minimal disc bulge.  No stenosis. T8-9: Trace 2 mm bony retropulsion related to the T8 compression fracture. No significant stenosis or cord impingement. Mild left foraminal narrowing. T9-10: Mild facet hypertrophy.  No stenosis. T10-11:  Mild facet hypertrophy.  No stenosis. T11-12:  Unremarkable. T12-L1:  Unremarkable. IMPRESSION: 1. Acute to early subacute compression fracture of T8 with up to 50% height loss and trace 2 mm bony retropulsion. No significant spinal stenosis or cord impingement. Finding would be amendable to vertebral augmentation. 2. Abnormal soft tissue edema involving the upper posterior paraspinous musculature bilaterally (rhomboid major musculature), suggesting muscular injury and/or strain. 3. Reactive marrow edema about the right T4-5 facet, compatible with active facet arthritis. Finding could contribute to underlying back pain. 4. Mild multilevel thoracic degenerative spondylitic changes without significant spinal stenosis. Mild to moderate right foraminal narrowing at T3-4 and T4-5, with mild left foraminal narrowing at T8-9. Electronically Signed   By: Jeannine Boga M.D.   On: 12/09/2018 03:53   Dg Chest Portable 1 View  Result Date: 12/07/2018 CLINICAL DATA:  Back pain EXAM: PORTABLE CHEST 1 VIEW COMPARISON:  Chest x-ray dated 02/12/2014. FINDINGS: The heart size and mediastinal contours are within normal limits. Both lungs are clear. The visualized skeletal structures are unremarkable. There are probable old healed anterior rib fractures on the right. IMPRESSION: No active disease.  Electronically Signed   By: Constance Holster M.D.   On: 12/07/2018 22:05   Ir Vertebroplasty [employer]/inject/imaging  Result Date: 12/15/2018 INDICATION: Thoracic back pain secondary to compression fracture at T8. EXAM: VERTEBROPLASTY AT T8 MEDICATIONS: As antibiotic prophylaxis, vancomycin 1 g IV was ordered pre-procedure and administered intravenously within 1 hour of incision. ANESTHESIA/SEDATION: Moderate (conscious) sedation was employed during this procedure. A total of Versed 2 mg and Fentanyl 75 mcg was administered intravenously. Moderate Sedation Time: 23 minutes. The patient's level of consciousness and vital signs were monitored continuously by radiology nursing throughout the procedure under my direct supervision. FLUOROSCOPY TIME:  Fluoroscopy Time: 7 minutes 56 seconds (263 mGy) COMPLICATIONS: None immediate. TECHNIQUE: Informed written consent was obtained from the patient after a thorough discussion of the procedural risks, benefits and alternatives. All questions were addressed. Maximal Sterile Barrier Technique was utilized including caps, mask, sterile gowns, sterile gloves, sterile drape, hand hygiene and skin antiseptic. A timeout was performed prior to the initiation of the procedure. PROCEDURE: The patient was placed prone on the fluoroscopic table. Nasal oxygen was administered. Physiologic monitoring was performed throughout the duration of the procedure. The skin overlying the thoracic region was prepped and draped in the usual sterile fashion. The T8 vertebral body was identified and the right pedicle was infiltrated  with 0.25% Bupivacaine. This was then followed by the advancement of a 13-gauge Cook needle through the right pedicle into the posterior one-third at T8. A 16 gauge core biopsy needle was then advanced into the T8 vertebral body. Two passes were made. Tissue obtained was sent for pathologic analysis. The needle was then advanced into the anterior 1/3  at T8. A gentle contrast injection demonstrated a trabecular pattern of contrast. At this time, methylmethacrylate mixture was reconstituted. Under biplane intermittent fluoroscopy, the methylmethacrylate was then injected into the T8 vertebral body with filling of the vertebral body. A small amount of the methylmethacrylate mixture was noted into the disc space through the fracture cleft along the superior endplate. No extension of the methylmethacrylate mixture was noted posteriorly into the spinal canal. No epidural venous contamination was seen. The needle was then removed. Hemostasis was achieved at the skin entry site. There were no acute complications. Patient tolerated the procedure well. The patient was returned to the floor in good condition. IMPRESSION: 1. Status post vertebral body augmentation for painful compression fracture at T8 using vertebroplasty technique. 2. Status post core biopsy at T8. Electronically Signed   By: Luanne Bras M.D.   On: 12/13/2018 12:37    Labs:  CBC: Recent Labs    12/07/18 1813 12/08/18 0349 12/09/18 0656 12/10/18 0452  WBC 23.5* 16.0* 7.2 6.2  HGB 17.1* 13.1 11.8* 12.6*  HCT 48.7 38.1* 33.2* 35.6*  PLT 293 214 188 190    COAGS: Recent Labs    12/13/18 0846  INR 1.0    BMP: Recent Labs    12/12/18 0503 12/13/18 0541 12/14/18 0613 12/15/18 0357  NA 140 141 137 139  K 3.0* 3.7 3.5 3.9  CL 101 105 101 103  CO2 30 29 24 29   GLUCOSE 105* 100* 92 86  BUN <5* 6 8 10   CALCIUM 8.7* 8.7* 9.1 9.2  CREATININE 0.78 0.76 0.71 0.78  GFRNONAA >60 >60 >60 >60  GFRAA >60 >60 >60 >60    LIVER FUNCTION TESTS: Recent Labs    12/12/18 0503 12/13/18 0541 12/14/18 0613 12/15/18 0357  BILITOT 0.4 0.5 0.3 0.6  AST 272* 175* 162* 127*  ALT 176* 156* 179* 169*  ALKPHOS 46 44 56 61  PROT 5.2* 5.1* 5.8* 5.6*  ALBUMIN 2.6* 2.6* 3.0* 3.0*     Assessment and Plan:  Back pain. Dr. Estanislado Pandy was present for consultation. Discussed patients  pain since procedure. Patient states that his pain has improved but is still present. Patient is frustrated throughout appointment, stating that "no one will help me". Requesting muscle relaxers. Explained that patients pain has improved at least 50% thus far. Recommended that patient continue current restrictions for 2 weeks post-procedure (no bending, stooping, or lifting more than 10 pounds; no driving self) and return to work after two week restrictions lifted (all restrictions lifted/return to work starting 12/29/2018). Work note given to patient with restrictions/return to work. Recommend patient discuss pain management with PCP- he states he has an appointment later this afternoon with PCP.   Plan for follow-up as needed. Please call our office if future appointments requested.  All questions answered and concerns addressed. Patient conveys understanding and agrees with plan.  Thank you for this interesting consult.  I greatly enjoyed meeting Donald Jenkins and look forward to participating in their care.  A copy of this report was sent to the requesting provider on this date.  Electronically Signed: Earley Abide, PA-C 12/22/2018, 11:42 AM  I spent a total of 40 Minutes in face to face in clinical consultation, greater than 50% of which was counseling/coordinating care for back pain.

## 2018-12-22 NOTE — Progress Notes (Signed)
Pt. Is following up on Hospital visit.  Pt. Is having pain, no medication was helping patient. Pt. Stated the Oxycodone is not helping him for pain.   Pt. Would like to talk about pain management.

## 2018-12-27 ENCOUNTER — Other Ambulatory Visit: Payer: BC Managed Care – PPO

## 2018-12-28 ENCOUNTER — Telehealth: Payer: Self-pay | Admitting: General Practice

## 2018-12-28 ENCOUNTER — Ambulatory Visit: Payer: BC Managed Care – PPO | Admitting: Physical Therapy

## 2018-12-28 ENCOUNTER — Other Ambulatory Visit: Payer: BC Managed Care – PPO

## 2018-12-28 NOTE — Telephone Encounter (Signed)
-----   Message from [place], [place] sent at 12/26/2018  4:04 PM EDT ----- Please schedule 1 month for assign PCP.

## 2018-12-28 NOTE — Telephone Encounter (Signed)
Called patient and LVM to return call and schedule a 1 month f/u appt to establish care.

## 2019-01-04 ENCOUNTER — Other Ambulatory Visit: Payer: BC Managed Care – PPO

## 2019-01-05 ENCOUNTER — Ambulatory Visit: Payer: BC Managed Care – PPO | Attending: Internal Medicine

## 2019-01-05 ENCOUNTER — Ambulatory Visit: Payer: BC Managed Care – PPO | Attending: Family Medicine

## 2019-01-05 ENCOUNTER — Telehealth: Payer: Self-pay | Admitting: General Practice

## 2019-01-05 ENCOUNTER — Other Ambulatory Visit: Payer: Self-pay

## 2019-01-05 DIAGNOSIS — N179 Acute kidney failure, unspecified: Secondary | ICD-10-CM

## 2019-01-05 DIAGNOSIS — M62830 Muscle spasm of back: Secondary | ICD-10-CM | POA: Insufficient documentation

## 2019-01-05 DIAGNOSIS — R293 Abnormal posture: Secondary | ICD-10-CM | POA: Diagnosis present

## 2019-01-05 DIAGNOSIS — T796XXA Traumatic ischemia of muscle, initial encounter: Secondary | ICD-10-CM

## 2019-01-05 DIAGNOSIS — M546 Pain in thoracic spine: Secondary | ICD-10-CM | POA: Insufficient documentation

## 2019-01-05 DIAGNOSIS — I10 Essential (primary) hypertension: Secondary | ICD-10-CM

## 2019-01-05 NOTE — Telephone Encounter (Signed)
Please ask patient to go to urgent care if he is in a lot of pain. I have never actually seen him and he appears to have insurance

## 2019-01-05 NOTE — Telephone Encounter (Signed)
I have never seen this patient so he will need an office visit or go to urgent care/ED or route message to the last provider who saw patient to see if they will address

## 2019-01-05 NOTE — Telephone Encounter (Signed)
Spoke with patient and informed him with what provider stated. Patient was not happy with decision but agree in the end to go to Urgent Care due to still having pain. Per pt he is going to go to Urgent Care tomorrow 01-06-19.

## 2019-01-05 NOTE — Therapy (Addendum)
[place], [place], [address] Phone: [phone]   Fax:  [phone]  Physical Therapy Evaluation/Discharge  Patient Details  Name: Donald Jenkins MRN: [id number] Date of Birth: 1971/02/14 Referring Provider (PT): Florencia Reasons, MD   Encounter Date: 01/05/2019  PT End of Session - 01/05/19 0909    Visit Number  1    Number of Visits  12    PT Start Time  0900    PT Stop Time  0950    PT Time Calculation (min)  50 min    Activity Tolerance  Patient limited by pain    Behavior During Therapy  [facility] for tasks assessed/performed;Anxious       Past Medical History:  Diagnosis Date  . Anxiety   . Hypertension   . Tachycardia     Past Surgical History:  Procedure Laterality Date  . CHOLECYSTECTOMY N/A 06/05/2018   Procedure: LAPAROSCOPIC CHOLECYSTECTOMY WITH POSSIBLE INTRAOPERATIVE CHOLANGIOGRAM;  Surgeon: Clovis Riley, MD;  Location: [facility] OR;  Service: General;  Laterality: N/A;  . IR VERTEBROPLASTY CERV/THOR BX INC UNI/BIL INC/INJECT/IMAGING  12/13/2018  . KNEE SURGERY      There were no vitals filed for this visit.   Subjective Assessment - 01/05/19 0902    Subjective  He reports fracture thoracic spine and now post kyphoplasty with continued significant pain limiting normaal home/work activity , limiting sleep.    Limitations  Sitting;Lifting;Standing;Walking;House hold activities    Patient Stated Goals  Decreased pain to return to normal activity    Currently in Pain?  Yes    Pain Score  8     Pain Location  Thoracic    Pain Orientation  Right;Mid;Upper    Pain Descriptors / Indicators  Aching;Constant    Pain Type  Acute pain    Pain Radiating Towards  RT buttock    Pain Onset  More than a month ago    Pain Frequency  Constant    Aggravating Factors   flexing and genral activity , sleep    Pain Relieving Factors  meds         [facility] PT Assessment - 01/05/19 0001      Assessment   Medical Diagnosis  thoracic compression fracture , non trumatic rhabdomylosis    Referring Provider (PT)  Florencia Reasons, MD    Onset Date/Surgical Date  --   started 12/05/18, Admitted to hosp 12/07/18,  Kyphoplsty done   Next MD Visit  None set    Prior Therapy  No      Precautions   Precautions  None      Restrictions   Weight Bearing Restrictions  No      Balance Screen   Has the patient fallen in the past 6 months  --   says fell out of couch but pain started 2 days before     [place] residence    Living Arrangements  Alone      Prior Function   Level of Independence  Independent    Vocation  Full time employment    Associate Professor.       Cognition   Overall Cognitive Status  Within Functional Limits for tasks assessed      Posture/Postural Control   Posture Comments  increased thoracic kyphosis      ROM / Strength   AROM / PROM / Strength  AROM;PROM;Strength      AROM  AROM Assessment Site  Lumbar    Lumbar Flexion  50    Lumbar Extension  30   no increased pain   Lumbar - Right Side Bend  15    Lumbar - Left Side Bend  15      Strength   Overall Strength Comments  WNL in LE   Reports       Flexibility   Soft Tissue Assessment /Muscle Length  yes    Hamstrings  RT 70 with pulling ,  Lt 80 degrees      Palpation   SI assessment   LT ASIS more proximal,, RT sacrum  lower                 Objective measurements completed on examination: See above findings.              PT Education - 01/05/19 0908    Education Details  POC, HEP    Person(s) Educated  Patient    Methods  Explanation;Demonstration;Tactile cues;Verbal cues;Handout    Comprehension  Verbalized understanding;Returned demonstration       PT Short Term Goals - 01/05/19 0954      PT SHORT TERM GOAL #1   Title  He will be independent with initial HEP    Time  3    Period  Weeks    Status  New      PT SHORT TERM GOAL #2    Title  He will report pain decreased 30-40% and able to sleep 20% better    Time  3    Period  Weeks    Status  New        PT Long Term Goals - 01/05/19 0955      PT LONG TERM GOAL #1   Title  He willb e able to do all HEP independent    Time  6    Period  Weeks    Status  New      PT LONG TERM GOAL #2   Title  He will report pain as intermittant and returned to work    Time  6    Period  Weeks    Status  New      PT LONG TERM GOAL #3   Title  He will e able to bend with 1-2 max pain.    Time  6    Period  Weeks    Status  New      PT LONG TERM GOAL #4   Title  He will  with good mechanics able to lift 30 pounds to return to work    Time  [place] - 01/05/19 0951    Clinical Impression Statement  Donald Jenkins presents with RT thoracic pain and pain into lower back and buttock. This is limiting home tasks and sleep and he is not able to work.    His anxiety I tink is limiting progress /pain control.   He should improve with skilled PT    Personal Factors and Comorbidities  Age    Examination-Activity Limitations  Lift;Bed Mobility;Locomotion Level;Bend;Transfers;Carry;Sit;Sleep;Squat    Examination-Participation Restrictions  Art gallery manager;Interpersonal Relationship;Laundry    Stability/Clinical Decision Making  Evolving/Moderate complexity    Clinical Decision Making  Moderate    Rehab Potential  Good    PT Frequency  2x / week  PT Duration  6 weeks    PT Treatment/Interventions  Taping;Manual techniques;Dry needling;Therapeutic activities;Therapeutic exercise;Electrical Stimulation;Moist Heat    PT Next Visit Plan  Review exer , manual and modalities , breathing exer, add to HEP    PT Home Exercise Plan  on elbows /pressups, side bend in side lye    Consulted and Agree with Plan of Care  Patient       Patient will benefit from skilled therapeutic intervention in order to improve the following deficits and  impairments:  Postural dysfunction, Pain, Increased muscle spasms, Decreased range of motion, Decreased activity tolerance  Visit Diagnosis: 1. Pain in thoracic spine   2. Abnormal posture   3. Muscle spasm of back        Problem List Patient Active Problem List   Diagnosis Date Noted  . Hypomagnesemia   . Fall   . Hypoglycemia 12/09/2018  . Bacteremia 12/09/2018  . Acute metabolic encephalopathy 48/25/0037  . Metabolic acidosis 04/88/8916  . Compression fracture of body of thoracic vertebra ([place]) 12/08/2018  . Rhabdomyolysis 12/07/2018  . SIRS (systemic inflammatory response syndrome) ([place]) 12/07/2018  . Back pain 12/07/2018  . Depression with anxiety 12/07/2018  . Abnormal LFTs 12/07/2018  . AKI (acute kidney injury) ([place]) 12/07/2018  . Elevated troponin 12/07/2018  . Hypersomnolence disorder, with mental disorder, acute, moderate 11/01/2018  . Cholecystitis 06/05/2018  . Recurrent major depression in full remission ([place]) 04/05/2018  . Social anxiety disorder 03/18/2018  . Panic disorder 03/18/2018  . Essential hypertension, benign 06/06/2015    Darrel Hoover  PT 01/05/2019, 10:00 AM  [facility] [address] [place], [place], [address] Phone: [phone]   Fax:  [phone]  Name: RAHKIM RABALAIS MRN: [id number] Date of Birth: 1970-12-29 PHYSICAL THERAPY DISCHARGE SUMMARY  Visits from Start of Care: 1  Current functional level related to goals / functional outcomes: He was having issues with transportation and was not able to come to PT. He reported by phone at end of July 2020 he was dong better though not able to do all stretching he was accustomed to doing he was better. He did not contact us in a month from the phone call so per agreement his is discharged   Remaining deficits: Unknown   Education / Equipment: HEP  Plan: Patient agrees to discharge.  Patient goals were not met. Patient is being  discharged due to not returning since the last visit.  ?????    Pearson Forster PT    9./24/20

## 2019-01-05 NOTE — Patient Instructions (Signed)
[place] extension  On elbows , press ups , standing back bend.   And lying Lt side  Over pillows   Both 3-5 min 3x/day

## 2019-01-05 NOTE — Telephone Encounter (Signed)
LMOM

## 2019-01-05 NOTE — Telephone Encounter (Signed)
Pt called to report he is still in a lot of pain, Flexeril has not seemed to work and he has not improved, he also reports loss of appetite. His appointment was moved to a sooner date and was put on the wait list. Please follow up

## 2019-01-06 LAB — CBC WITH DIFFERENTIAL/PLATELET
Basophils Absolute: 0 10*3/uL (ref 0.0–0.2)
Basos: 0 %
EOS (ABSOLUTE): 0.2 10*3/uL (ref 0.0–0.4)
Eos: 2 %
Hematocrit: 43.7 % (ref 37.5–51.0)
Hemoglobin: 14.7 g/dL (ref 13.0–17.7)
Immature Grans (Abs): 0.1 10*3/uL (ref 0.0–0.1)
Immature Granulocytes: 1 %
Lymphocytes Absolute: 1.6 10*3/uL (ref 0.7–3.1)
Lymphs: 21 %
MCH: 30.5 pg (ref 26.6–33.0)
MCHC: 33.6 g/dL (ref 31.5–35.7)
MCV: 91 fL (ref 79–97)
Monocytes Absolute: 0.7 10*3/uL (ref 0.1–0.9)
Monocytes: 10 %
Neutrophils Absolute: 5.1 10*3/uL (ref 1.4–7.0)
Neutrophils: 66 %
Platelets: 309 10*3/uL (ref 150–450)
RBC: 4.82 x10E6/uL (ref 4.14–5.80)
RDW: 12.2 % (ref 11.6–15.4)
WBC: 7.7 10*3/uL (ref 3.4–10.8)

## 2019-01-06 LAB — COMPREHENSIVE METABOLIC PANEL
ALT: 62 IU/L — ABNORMAL HIGH (ref 0–44)
AST: 39 IU/L (ref 0–40)
Albumin/Globulin Ratio: 1.7 (ref 1.2–2.2)
Albumin: 4.7 g/dL (ref 4.0–5.0)
Alkaline Phosphatase: 102 IU/L (ref 39–117)
BUN/Creatinine Ratio: 11 (ref 9–20)
BUN: 8 mg/dL (ref 6–24)
Bilirubin Total: 0.4 mg/dL (ref 0.0–1.2)
CO2: 23 mmol/L (ref 20–29)
Calcium: 9.9 mg/dL (ref 8.7–10.2)
Chloride: 97 mmol/L (ref 96–106)
Creatinine, Ser: 0.7 mg/dL — ABNORMAL LOW (ref 0.76–1.27)
GFR calc Af Amer: 130 mL/min/{1.73_m2} (ref >59)
GFR calc non Af Amer: 112 mL/min/{1.73_m2} (ref >59)
Globulin, Total: 2.8 g/dL (ref 1.5–4.5)
Glucose: 86 mg/dL (ref 65–99)
Potassium: 4.4 mmol/L (ref 3.5–5.2)
Sodium: 139 mmol/L (ref 134–144)
Total Protein: 7.5 g/dL (ref 6.0–8.5)

## 2019-01-06 LAB — CK: Total CK: 125 U/L (ref 49–439)

## 2019-01-06 LAB — MAGNESIUM: Magnesium: 2 mg/dL (ref 1.6–2.3)

## 2019-01-10 ENCOUNTER — Other Ambulatory Visit: Payer: Self-pay | Admitting: Physician Assistant

## 2019-01-10 DIAGNOSIS — M546 Pain in thoracic spine: Secondary | ICD-10-CM

## 2019-01-11 ENCOUNTER — Telehealth: Payer: Self-pay | Admitting: General Practice

## 2019-01-11 ENCOUNTER — Ambulatory Visit: Payer: BC Managed Care – PPO | Admitting: Physical Therapy

## 2019-01-11 ENCOUNTER — Other Ambulatory Visit: Payer: Self-pay | Admitting: Physician Assistant

## 2019-01-11 DIAGNOSIS — M546 Pain in thoracic spine: Secondary | ICD-10-CM

## 2019-01-11 MED ORDER — CYCLOBENZAPRINE HCL 5 MG PO TABS: 5.0000 mg | ORAL_TABLET | Freq: Three times a day (TID) | ORAL | 0 refills | Status: DC | PRN

## 2019-01-11 NOTE — Telephone Encounter (Signed)
Patients call returned.  Patient identified by name and date of birth.  Patient very upset that he was not able to get an immediate refill for flexeril.  Patient states he believed he was given a thirteen day supply and he refilled his one refill on 7/5 and was out and needed more.  Patient ranted that no one was listening to him and that ever since his surgery he has been given the run around by the [facility] system and that he would never use the [facility] system alone.  Nurse attempted to apologize but patient hung up using curse words.

## 2019-01-11 NOTE — Telephone Encounter (Signed)
Patient states he has a knott feeling in his back. Like a tightness. Please follow up.

## 2019-01-11 NOTE — Telephone Encounter (Signed)
Pt called again stating that he is completely out of  -cyclobenzaprine (FLEXERIL) 5 MG tablet  He was only given a 26 day supply, please refill and send to  -[id number] - [place], [place] [address] If possible enough to last until his appt with [facility] on 01/18/2019

## 2019-01-18 ENCOUNTER — Telehealth: Payer: Self-pay | Admitting: Psychiatry

## 2019-01-18 ENCOUNTER — Other Ambulatory Visit: Payer: Self-pay

## 2019-01-18 ENCOUNTER — Ambulatory Visit: Payer: BC Managed Care – PPO | Attending: Family Medicine | Admitting: Family Medicine

## 2019-01-18 ENCOUNTER — Encounter: Payer: Self-pay | Admitting: Family Medicine

## 2019-01-18 ENCOUNTER — Ambulatory Visit: Payer: BC Managed Care – PPO | Admitting: Physical Therapy

## 2019-01-18 ENCOUNTER — Telehealth: Payer: Self-pay | Admitting: Physical Therapy

## 2019-01-18 DIAGNOSIS — S22060D Wedge compression fracture of T7-T8 vertebra, subsequent encounter for fracture with routine healing: Secondary | ICD-10-CM | POA: Diagnosis not present

## 2019-01-18 DIAGNOSIS — F329 Major depressive disorder, single episode, unspecified: Secondary | ICD-10-CM

## 2019-01-18 DIAGNOSIS — R945 Abnormal results of liver function studies: Secondary | ICD-10-CM

## 2019-01-18 DIAGNOSIS — F419 Anxiety disorder, unspecified: Secondary | ICD-10-CM

## 2019-01-18 DIAGNOSIS — I10 Essential (primary) hypertension: Secondary | ICD-10-CM

## 2019-01-18 DIAGNOSIS — R7989 Other specified abnormal findings of blood chemistry: Secondary | ICD-10-CM

## 2019-01-18 DIAGNOSIS — M546 Pain in thoracic spine: Secondary | ICD-10-CM | POA: Insufficient documentation

## 2019-01-18 DIAGNOSIS — G4700 Insomnia, unspecified: Secondary | ICD-10-CM

## 2019-01-18 DIAGNOSIS — F32A Depression, unspecified: Secondary | ICD-10-CM

## 2019-01-18 DIAGNOSIS — R413 Other amnesia: Secondary | ICD-10-CM

## 2019-01-18 MED ORDER — MIRTAZAPINE 15 MG PO TABS: 15.0000 mg | ORAL_TABLET | Freq: Every day | ORAL | 1 refills | Status: DC

## 2019-01-18 MED ORDER — CYCLOBENZAPRINE HCL 5 MG PO TABS: ORAL_TABLET | ORAL | 0 refills | Status: DC

## 2019-01-18 MED ORDER — LEVOCETIRIZINE DIHYDROCHLORIDE 5 MG PO TABS: 5.0000 mg | ORAL_TABLET | Freq: Every evening | ORAL | 5 refills | Status: DC

## 2019-01-18 MED ORDER — IBUPROFEN 800 MG PO TABS: ORAL_TABLET | ORAL | 0 refills | Status: DC

## 2019-01-18 MED ORDER — HYDROXYZINE HCL 10 MG PO TABS: ORAL_TABLET | ORAL | 0 refills | Status: DC

## 2019-01-18 MED ORDER — AMLODIPINE BESYLATE 5 MG PO TABS: 5.0000 mg | ORAL_TABLET | Freq: Every day | ORAL | 3 refills | Status: DC

## 2019-01-18 NOTE — Telephone Encounter (Signed)
I returned phone call to only answering machine reviewing psychiatry of current tx options if cleared with internal medicine suggesting restarting Remeron he stopped last fall. He can review with his team and let us know if we can help further with medication or therapy.

## 2019-01-18 NOTE — Telephone Encounter (Signed)
Returned Donald Jenkins's call re: questions about PT that could be answered over the phone due to his inability to come into the clinic. He called in yesterday to cancel today's appointment due to financial reasons. [name] reports his pain is gone and his primary complaint is he is unable to lie flat on the floor and perform a hip stretch he did prior due to his back feeling like a brick. He questioned if it would feel like this forever. I explained the nature of healing with surgery and that his lack of sleep can hinder healing. He reports seeing his PCP today who prescribed something for this. He reports he is currently doing his exercises and they are going ok. We agreed that he would not schedule additional appointments at this time due to transportation/financial and other barriers and would continue the exercises. He will call us if needed in the next 30 days. After that time, we will DC and he will get a new referral if needed.   Romualdo Bolk, PT, DPT 01/18/19 12:55 PM Phone: [phone] Fax: [phone]

## 2019-01-18 NOTE — Progress Notes (Signed)
Virtual Visit via Telephone Note  I connected with Donald Jenkins on 01/18/19 at 10:10 AM EDT by telephone and verified that I am speaking with the correct person using two identifiers.   I discussed the limitations, risks, security and privacy concerns of performing an evaluation and management service by telephone and the availability of in person appointments. I also discussed with the patient that there may be a patient responsible charge related to this service. The patient expressed understanding and agreed to proceed.  Patient Location: Home Provider Location: Office at [place] Others participating in call: call initiated by Mariane Baumgarten, CMA   History of Present Illness:       48 yo male who is s/p hospitalization from 12/07/2018-12/15/2018 due to rhabdomyolisis with acute kidney injury and T8 fracture. Patient reports the inability to sleep due to pain since his surgery but he has also had issues with insomnia in the past for which he took Ambien long term but stopped the use of the medication before his hospitalization. He was also on Xanax in the past for panic disorders/anxiety but felt that his issues were better and he stopped this medication before his hospitalization. Patient reports that he sees Dr. Creig Hines at [facility] psychiatry. Patient with a list of multiple complaints or concerns. He had a recent episode of inability to speak-he could not get his words out for about 5 minutes while he was at work and this was witnessed by his Librarian, academic.  Patient states that he could hear coworkers suggesting that 911 be called but then the episode resolved.  He wonders if he could have brain issues related to his recent hospitalization as he states that he still has memory issues/difficulty with recall after his hospitalization.        He is status post hospitalization from 12/07/2018 through 12/15/2018.  Patient recalls falling asleep on the couch and then waking up on the floor beside the couch.  He  believes that he fell off of the couch but then was unable to get back up due to back pain. (Per his hospital admission H&P, patient was found lying naked on the floor of his living room with pills and vomitus nearby as per the EMS report which also states that patient denied any use of alcohol or pills/illicit drugs).  Patient states that since he was hospitalized, he will not remember things for very long.  He reports that he went to get gas in his vehicle at a gas station and went inside to prepare for the gas and patient states that he also got some snacks and a drink and then when he got back to his truck he drove off without getting the gas.  He also continues to have back pain from the vertebral fracture that was noted during his hospitalization.  He does continue to go to his job at [employer] for which he has to be up at 4:30 in the morning.  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  He does not know how he developed the vertebral fracture as he was having back pain prior to the night that he thinks that he fell off of the couch prior to his hospitalization.  He feels that when he has been seen in follow-up of his back pain that no one really listens to him and dismisses his complaints of back pain.  He reports that he was unable to get  the Flexeril that was prescribed for his muscle spasms refilled.  He states that he was told to take the Flexeril 5 mg 3 times daily which he was doing and ran out of the medication but was then told that he could not get a refill until later in the month.  Patient states that he was not given enough pills to take the pills 3 times daily without running out of the medication.  He reports that he cannot sleep secondary to back pain and stiffness.  His back pain ranges from a 6 to greater than a 10.  He also needs refills of other medications.  Patient states that prior to his hospitalization he was not really  talkative but now he states that he feels as if he talks a lot.  He also reports lack of appetite.  Patient repeatedly mentioned how he was thankful that the [facility] who contacted him to initiate the visit let him "vent" and appreciated that I was listening to him.  Patient reports that he is always had issues with anxiety as well as panic attacks.  He reports that he called his psychiatrist in order to restart the use of Xanax to take to help calm him down when he has onset of panic attacks which cause him to feel as if he is having racing heart rate, chest tightness and shortness of breath.  Patient also reports the need to restart Ritalin which he took in the past as he states that this medication helped him to be more focused and alert.  Patient however also states that he is having issues with sleep at this time.  He reports that his psychiatrist was not in the office but that he was told that he could be seen this upcoming Tuesday and that his psychiatrist would also be made of patient's concerns when the psychiatrist returns tomorrow. (Patient repeated about 3 times that he had contacted his psychiatrist and could not be seen until this upcoming Tuesday, after I repeated to him each time that he would need to contact his psychiatrist tomorrow or wait until the appointment regarding the Xanax and Ritalin).  Patient denies any suicidal thoughts or ideations at this time.      He also states that he is not going to follow-up with cardiology as he does not feel that he needs to see cardiology.  He reports that he did receive a monitor in the mail which he returned as he states that he does not have any issues with his heart.        Future Appointments  Date Time Provider [place]  01/24/2019  9:00 AM Delight Hoh, MD CP-CP None  05/04/2019 10:20 AM Delight Hoh, MD CP-CP None    Past Medical History:  Diagnosis Date  . Anxiety   . Hypertension   . Tachycardia     Past Surgical History:   Procedure Laterality Date  . CHOLECYSTECTOMY N/A 06/05/2018   Procedure: LAPAROSCOPIC CHOLECYSTECTOMY WITH POSSIBLE INTRAOPERATIVE CHOLANGIOGRAM;  Surgeon: Clovis Riley, MD;  Location: [facility] OR;  Service: General;  Laterality: N/A;  . IR VERTEBROPLASTY CERV/THOR BX INC UNI/BIL INC/INJECT/IMAGING  12/13/2018  . KNEE SURGERY      Family History  Problem Relation Age of Onset  . Lung cancer Father   . Esophageal cancer Father   . Brain cancer Father     Social History   Tobacco Use  . Smoking status: Former Research scientist (life sciences)  . Smokeless tobacco: Never Used  Substance Use Topics  . Alcohol use: Not Currently    Alcohol/week: 0.0 standard drinks    Comment: socially, "once in a blue moon"  . Drug use: Not Currently    Types: Marijuana     Allergies  Allergen Reactions  . Penicillins Other (See Comments)    Did it involve swelling of the face/tongue/throat, SOB, or low BP? Unknown Did it involve sudden or severe rash/hives, skin peeling, or any reaction on the inside of your mouth or nose? Unknown Did you need to seek medical attention at a hospital or doctor's office? Unknown When did it last happen?childhood If all above answers are "NO", may proceed with cephalosporin use.        Observations/Objective: No vital signs or physical exam conducted as visit was done via telephone  Assessment and Plan: 1. Acute midline thoracic back pain; 2.  Compression fracture of T8 vertebra with routine healing, subsequent encounter Patient's recent hospitalization notes and imaging reviewed and patient is status post compression fracture of T8 vertebra and has now also undergone kyphoplasty for treatment of the compression fracture.  He has had difficulty obtaining refills of medications that were recently prescribed to help with his back pain and patient will have prescription sent to his pharmacy for Flexeril and ibuprofen that can be filled today.  He is to continue specialty follow-up with  physical medicine and rehab/orthopedics. - cyclobenzaprine (FLEXERIL) 5 MG tablet; May take one pill up to 3 times per day as needed for muscle spasm  Dispense: 60 tablet; Refill: 0 - ibuprofen (ADVIL) 800 MG tablet; One pill every 8 hours as needed for pain; take after eating  Dispense: 60 tablet; Refill: 0  3. Insomnia, unspecified type Patient with complaint of inability to sleep as well as loss of appetite.  Patient will be started on Remeron 15 mg at bedtime which should help with sleep, loss of appetite and patient's current anxiety.  Patient is to keep his upcoming follow-up appointment with his psychiatrist for further evaluation and treatment of his chronic mental health issues. - mirtazapine (REMERON) 15 MG tablet; Take 1 tablet (15 mg total) by mouth at bedtime.  Dispense: 30 tablet; Refill: 1  4. Essential hypertension, benign Patient provided with refill of his amlodipine for continued control of hypertension.  Cardiology follow-up was mentioned to the patient as per discharge summary, the cause of his fall at home/possible syncopal episode was unknown and cardiac monitoring to look for arrhythmia was set up at time of hospital discharge along with cardiology follow-up but patient is refusing cardiology follow-up/monitoring though I did try to explain the reason for cardiology follow-up and heart monitoring but patient still insists that he merely fell off of the couch while sleeping and was not found on the floor of his home by EMS. - amLODipine (NORVASC) 5 MG tablet; Take 1 tablet (5 mg total) by mouth daily. To help lower blood pressure  Dispense: 30 tablet; Refill: 3  5. Anxiety and depression He has been asked to follow-up tomorrow with his psychiatrist office once the psychiatrist is back and patient also has appointment this Tuesday with psychiatry.  Patient is being placed on Remeron to help with his sleep, appetite and hopefully also with his anxiety.  I discussed with the patient  that some of his issues with memory loss might be explained by his lack of sleep.  Hydroxyzine/Atarax provided to take as needed for acute symptoms caused by panic attacks and he is made aware that this  medication may also cause some drowsiness. - hydrOXYzine (ATARAX/VISTARIL) 10 MG tablet; Once every 6-8 hours as needed for acute anxiety  Dispense: 30 tablet; Refill: 0 - levocetirizine (XYZAL) 5 MG tablet; Take 1 tablet (5 mg total) by mouth every evening. To help with nasal allergies  Dispense: 30 tablet; Refill: 5  6. Memory loss Patient with complaint of memory loss status post recent hospitalization in which patient may have had a syncopal episode that caused patient to be immobile leading to rhabdomyolysis.  Patient also with history of anxiety, depression and panic attacks and on review of chart, patient also with hypersomnolence disorder.  Patient also admits to use of marijuana to help with his anxiety.  He also reports inability to get adequate sleep status post hospital discharge secondary to his back pain.  Patient with multiple factors which may be contributing to his memory issues.  He has been referred to neurology for further evaluation and also has follow-up with his psychiatrist - Ambulatory referral to Neurology  7. Abnormal LFTs LFTs were greatly elevated during hospitalization with AST of 127 and ALT of 169 and I discussed with the patient that at his most recent office appointment on 01/05/2019, his AST was now normal at 39 and ALT improved at 62.  Follow Up Instructions:Return in about 2 weeks (around 02/01/2019) for labs/new medications.    I discussed the assessment and treatment plan with the patient. The patient was provided an opportunity to ask questions and all were answered. The patient agreed with the plan and demonstrated an understanding of the instructions.   The patient was advised to call back or seek an in-person evaluation if the symptoms worsen or if the condition  fails to improve as anticipated.  I provided 50 minutes of non-face-to-face time during this encounter. I spoke with and listened to patient for 50 minutes at today's encounter.  An additional 20 minutes was spent reviewing patient's past medical records, labs and imaging.  Antony Blackbird, MD

## 2019-01-18 NOTE — Progress Notes (Signed)
Patient want to establish care for spinal pain.  Pt. Is in a lot of pain and is only taking Ibuprofen.

## 2019-01-18 NOTE — Telephone Encounter (Signed)
PT WOULD LIKE TO SPEAK WITH GJ ASAP. HE IS HAVING SEVERE INSOMNIA . HAS TRIED MULTIPLE MEDS.Marland Kitchen RECOVERING FROM A  SPINAL CORD FRACTURE. STATED GJ COULD CHECK HIS MY CHART TO SEE HIS ISSUES DUE TO THIS PROCEDURE. HIS ANXIETY IS UP AND HE IS VERY DEPRESSED.Marland KitchenCRYING WHILE TRYING TO LEAVE THIS MSG. HE STATES HE NEED TO GET BACK ON THE RITALIN BECAUSE HE IS RAMBLING BETWEEN OUTBURST OF UNCONTROLLABLE CRYING.

## 2019-01-20 ENCOUNTER — Other Ambulatory Visit: Payer: Self-pay | Admitting: Family Medicine

## 2019-01-20 DIAGNOSIS — G4700 Insomnia, unspecified: Secondary | ICD-10-CM

## 2019-01-20 NOTE — Telephone Encounter (Signed)
Internal medicine provided Remeron 15 mg nightly same as my voice mail message to patient recommended when he did not answer but he confirmed today by phoning back that he also got nursing messages from our office and was secure with interim treatment to next appt 01/24/2019.

## 2019-01-20 NOTE — Telephone Encounter (Signed)
Pt called. Wanted to leave message for nurse. He has appt Tuesday 7/28 with GJ. He will be here, he stated. PCP was able to give meds until he sees GJ.

## 2019-01-20 NOTE — Telephone Encounter (Signed)
Left voicemail to call back if requesting anything from Dr. Creig Hines

## 2019-01-24 ENCOUNTER — Ambulatory Visit (INDEPENDENT_AMBULATORY_CARE_PROVIDER_SITE_OTHER): Payer: BC Managed Care – PPO | Admitting: Psychiatry

## 2019-01-24 ENCOUNTER — Encounter: Payer: Self-pay | Admitting: Psychiatry

## 2019-01-24 ENCOUNTER — Other Ambulatory Visit: Payer: Self-pay

## 2019-01-24 VITALS — Ht 72.0 in | Wt 194.0 lb

## 2019-01-24 DIAGNOSIS — F5113 Hypersomnia due to other mental disorder: Secondary | ICD-10-CM

## 2019-01-24 DIAGNOSIS — F410 Panic disorder [episodic paroxysmal anxiety] without agoraphobia: Secondary | ICD-10-CM | POA: Diagnosis not present

## 2019-01-24 DIAGNOSIS — F4010 Social phobia, unspecified: Secondary | ICD-10-CM

## 2019-01-24 DIAGNOSIS — G4710 Hypersomnia, unspecified: Secondary | ICD-10-CM

## 2019-01-24 DIAGNOSIS — T796XXS Traumatic ischemia of muscle, sequela: Secondary | ICD-10-CM

## 2019-01-24 DIAGNOSIS — F3342 Major depressive disorder, recurrent, in full remission: Secondary | ICD-10-CM

## 2019-01-24 DIAGNOSIS — F99 Mental disorder, not otherwise specified: Secondary | ICD-10-CM

## 2019-01-24 NOTE — Progress Notes (Signed)
[facility]  Patient ID: Donald Jenkins,  MRN: [id number]  PCP: Patient, No Pcp Per  Date of Evaluation: 01/24/2019 Time spent:20 minutes from 0900 to 0920  Chief Complaint:  Chief Complaint    Anxiety; Panic Attack; Depression      HISTORY/CURRENT STATUS: Donald Jenkins is seen onsite in office face-to-face with consent individually with epic collateral for psychiatric interview and exam in 65-month evaluation and management of social and panic anxiety and recurrent major depression with newly defined hypersomnolence as he disengaged from his primary medications of Remeron and Lamictal and required review of her modafinil, BuSpar, and then Ritalin for his hypersomnolence on the job and in his new relationship with girlfriend.  In the course of building that relationship with the girlfriend in [place], he has been modestly socially anxious and not depressed except over fluctuations in the relationship.  He did fall in love as did she but he seems uncomfortable with the uncertainty of her asked Asperger's.  Still she was visiting recently at his home so that they slept on the couch which he continued to do after she left apparently.  He defines going to bed with his usual or worse than usual backache but awaking in the floor unable to move as though rolling off the couch needing EMS to get him to the hospital.  He had a surgical stabilization of his thoracic vertebral fracture and states they considered his rhabdomyolysis possibly a consequence of that fracture.  Renal failure, hepatic inflammation, and encephalopathy associated with these required hospitalization included hydration and electrolyte restoration.  He is in significant recovery regaining weight having lost 13 pounds then regaining possibly 19 pounds.  He reviews my phone message to him last week when out of town and the nurses follow-up from our office, patient receiving more benefit from psychological support when his pain  medications were no longer working.  He suggests he is much improved in his pain needing only ibuprofen and Flexeril now.  His blood pressure pill was changed from the lisinopril with HCTZ to amlodipine.  He has hydroxyzine if needed for anxiety as needed but is out of Xanax having 90-day refill available early next week.  He states otherwise he lost his Xanax.  He is not taking as much Sonata but is only sleeping 4 hours at night.  Follow-up his PCP did restart his Remeron 15 mg nightly and overall he is much improved.  He has been off Xanax for 2 months just carrying it in his pocket not needing it and cannot explain having lost several of his medications.  He would like to restart the Ritalin for his daytime somnolence but realizes part of that is from his metabolic encephalopathy and now is willing to have that clear as well as restore sleep before restarting Ritalin if needed he does return to work.  He is not psychotic, manic, delirious, or dissociative.  Depression       This is a recurrent problem.  The current episode started more than 1 year ago.   The onset quality is sudden.   The problem occurs intermittently.  The problem has been resolved since onset.  Associated symptoms include decreased concentration, fatigue and insomnia.  Associated symptoms include not irritable, no decreased interest, no body aches, no headaches, not sad and no suicidal ideas.     The symptoms are aggravated by work stress and social issues.  Past treatments include SSRIs - Selective serotonin reuptake inhibitors and other medications.  Individual Medical  History/ Review of Systems: Changes? :Yes Thoracic vertebral fracture, homolysis, and associated hepatic, renal, and metabolic encephalopathy relative failure now significantly improved  Allergies: Penicillins  Current Medications:  Current Outpatient Medications:  .  ALPRAZolam (XANAX) 1 MG tablet, Take 1 tablet (1 mg total) by mouth 3 (three) times daily as  needed for anxiety. (Patient not taking: Reported on 01/18/2019), Disp: 270 tablet, Rfl: 1 .  amLODipine (NORVASC) 5 MG tablet, Take 1 tablet (5 mg total) by mouth daily. To help lower blood pressure, Disp: 30 tablet, Rfl: 3 .  aspirin EC 81 MG EC tablet, Take 1 tablet (81 mg total) by mouth daily. (Patient not taking: Reported on 01/18/2019), Disp: 30 tablet, Rfl: 0 .  cyclobenzaprine (FLEXERIL) 5 MG tablet, May take one pill up to 3 times per day as needed for muscle spasm, Disp: 60 tablet, Rfl: 0 .  hydrOXYzine (ATARAX/VISTARIL) 10 MG tablet, Once every 6-8 hours as needed for acute anxiety, Disp: 30 tablet, Rfl: 0 .  ibuprofen (ADVIL) 800 MG tablet, One pill every 8 hours as needed for pain; take after eating, Disp: 60 tablet, Rfl: 0 .  levocetirizine (XYZAL) 5 MG tablet, Take 1 tablet (5 mg total) by mouth every evening. To help with nasal allergies, Disp: 30 tablet, Rfl: 5 .  methylphenidate (RITALIN) 5 MG tablet, Take 1 tablet (5 mg total) by mouth 3 (three) times daily with meals. (Patient not taking: Reported on 12/22/2018), Disp: 90 tablet, Rfl: 0 .  mirtazapine (REMERON) 15 MG tablet, Take 1 tablet (15 mg total) by mouth at bedtime., Disp: 30 tablet, Rfl: 1 .  oxyCODONE (OXY IR/ROXICODONE) 5 MG immediate release tablet, Take 1 tablet (5 mg total) by mouth 2 (two) times daily as needed for up to 10 doses for severe pain. (Patient not taking: Reported on 12/22/2018), Disp: 10 tablet, Rfl: 0   Medication Side Effects: none   for Ritalin, Xanax, Sonata, his drill, and now Remeron  Family Medical/ Social History: Changes? Yes mother and girlfriend have been supportive but girlfriend is not back in the area for his convalescence.  MENTAL HEALTH EXAM:  Height 6' (1.829 m), weight 194 lb (88 kg).Body mass index is 26.31 kg/m.  As deferred nonessential in coronavirus pandemic  General Appearance: Casual, Fairly Groomed and Guarded  Eye Contact:  Good  Speech:  Clear and Coherent, Normal Rate and  Talkative  Volume:  Normal  Mood:  Anxious and Dysphoric  Affect:  Inappropriate, Restricted and Anxious  Thought Process:  Goal Directed, Irrelevant and Linear  Orientation:  Full (Time, Place, and Person)  Thought Content: Obsessions and Rumination   Suicidal Thoughts:  No  Homicidal Thoughts:  No  Memory:  Immediate;   Good Remote;   Good  Judgement:  Fair  Insight:  Fair  Psychomotor Activity:  Normal and Mannerisms  Concentration:  Concentration: Fair and Attention Span: Good  Recall:  Good  Fund of Knowledge: Good  Language: Good  Assets:  Desire for Improvement Resilience Talents/Skills  ADL's:  Intact  Cognition: WNL  Prognosis:  Good    DIAGNOSES:    ICD-10-CM   1. Social anxiety disorder  F40.10   2. Panic disorder  F41.0   3. Recurrent major depression in full remission ([place])  F33.42   4. Hypersomnolence disorder, with mental disorder, acute, moderate  G47.10    F99   5. Traumatic rhabdomyolysis, sequela  T79.6XXS     Receiving Psychotherapy: No    RECOMMENDATIONS: The patient will refill  his Xanax 1 mg 3 times daily as needed for panic being a 90-day refill available at [facility] on [address] though he states he rarely uses it just keeping it on hand in case of panic which prevents panic.  He has Sonata 10 mg nightly if needed for insomnia though he is working on reestablishing normal sleep.  He is continuing Remeron 15 mg nightly though only agreeable to do so for a month or 2 until the situational exacerbation of depression is remitted.  That time he hopes to resume his Ritalin 5 mg 3 times daily for hypersomnolence has Vistaril if needed for mild anxiety in the interim.  Return at previously scheduled time in about 3 months or sooner if needed though he has psychologically improved over last week when he was desperate as sleep, pain, and nutrition are definitely restored. restored   Delight Hoh, MD

## 2019-01-26 ENCOUNTER — Ambulatory Visit: Payer: BC Managed Care – PPO | Admitting: Family Medicine

## 2019-02-06 ENCOUNTER — Other Ambulatory Visit: Payer: Self-pay | Admitting: Registered Nurse

## 2019-02-06 DIAGNOSIS — I10 Essential (primary) hypertension: Secondary | ICD-10-CM

## 2019-02-06 NOTE — Telephone Encounter (Signed)
Please advise..looks like this medication may have been discontinued

## 2019-03-02 ENCOUNTER — Emergency Department (HOSPITAL_COMMUNITY)
Admission: EM | Admit: 2019-03-02 | Discharge: 2019-03-02 | Disposition: A | Discharge status: Home / Self Care | Payer: BC Managed Care – PPO | Attending: Emergency Medicine | Admitting: Emergency Medicine

## 2019-03-02 ENCOUNTER — Encounter (HOSPITAL_COMMUNITY): Payer: Self-pay | Admitting: Emergency Medicine

## 2019-03-02 ENCOUNTER — Emergency Department (HOSPITAL_COMMUNITY): Payer: BC Managed Care – PPO

## 2019-03-02 DIAGNOSIS — Z20828 Contact with and (suspected) exposure to other viral communicable diseases: Secondary | ICD-10-CM | POA: Insufficient documentation

## 2019-03-02 DIAGNOSIS — R51 Headache: Secondary | ICD-10-CM | POA: Insufficient documentation

## 2019-03-02 DIAGNOSIS — I10 Essential (primary) hypertension: Secondary | ICD-10-CM | POA: Diagnosis not present

## 2019-03-02 DIAGNOSIS — F410 Panic disorder [episodic paroxysmal anxiety] without agoraphobia: Secondary | ICD-10-CM | POA: Diagnosis not present

## 2019-03-02 DIAGNOSIS — Z87891 Personal history of nicotine dependence: Secondary | ICD-10-CM | POA: Insufficient documentation

## 2019-03-02 DIAGNOSIS — Z79899 Other long term (current) drug therapy: Secondary | ICD-10-CM | POA: Diagnosis not present

## 2019-03-02 DIAGNOSIS — F419 Anxiety disorder, unspecified: Secondary | ICD-10-CM

## 2019-03-02 LAB — RAPID URINE DRUG SCREEN, HOSP PERFORMED
Amphetamines: NOT DETECTED
Barbiturates: NOT DETECTED
Benzodiazepines: NOT DETECTED
Cocaine: NOT DETECTED
Opiates: NOT DETECTED
Tetrahydrocannabinol: POSITIVE — AB

## 2019-03-02 LAB — COMPREHENSIVE METABOLIC PANEL
ALT: 34 U/L (ref 0–44)
AST: 69 U/L — ABNORMAL HIGH (ref 15–41)
Albumin: 4.6 g/dL (ref 3.5–5.0)
Alkaline Phosphatase: 85 U/L (ref 38–126)
Anion gap: 17 — ABNORMAL HIGH (ref 5–15)
BUN: 21 mg/dL — ABNORMAL HIGH (ref 6–20)
CO2: 26 mmol/L (ref 22–32)
Calcium: 9.1 mg/dL (ref 8.9–10.3)
Chloride: 97 mmol/L — ABNORMAL LOW (ref 98–111)
Creatinine, Ser: 0.67 mg/dL (ref 0.61–1.24)
GFR calc Af Amer: >60 mL/min (ref >60)
GFR calc non Af Amer: >60 mL/min (ref >60)
Glucose, Bld: 105 mg/dL — ABNORMAL HIGH (ref 70–99)
Potassium: 3 mmol/L — ABNORMAL LOW (ref 3.5–5.1)
Sodium: 140 mmol/L (ref 135–145)
Total Bilirubin: 1.2 mg/dL (ref 0.3–1.2)
Total Protein: 8.3 g/dL — ABNORMAL HIGH (ref 6.5–8.1)

## 2019-03-02 LAB — CBC WITH DIFFERENTIAL/PLATELET
Abs Immature Granulocytes: 0.04 10*3/uL (ref 0.00–0.07)
Basophils Absolute: 0 10*3/uL (ref 0.0–0.1)
Basophils Relative: 0 %
Eosinophils Absolute: 0 10*3/uL (ref 0.0–0.5)
Eosinophils Relative: 0 %
HCT: 40.9 % (ref 39.0–52.0)
Hemoglobin: 14.4 g/dL (ref 13.0–17.0)
Immature Granulocytes: 0 %
Lymphocytes Relative: 9 %
Lymphs Abs: 1.1 10*3/uL (ref 0.7–4.0)
MCH: 29.9 pg (ref 26.0–34.0)
MCHC: 35.2 g/dL (ref 30.0–36.0)
MCV: 85 fL (ref 80.0–100.0)
Monocytes Absolute: 0.6 10*3/uL (ref 0.1–1.0)
Monocytes Relative: 5 %
Neutro Abs: 10.1 10*3/uL — ABNORMAL HIGH (ref 1.7–7.7)
Neutrophils Relative %: 86 %
Platelets: 308 10*3/uL (ref 150–400)
RBC: 4.81 MIL/uL (ref 4.22–5.81)
RDW: 12.3 % (ref 11.5–15.5)
WBC: 11.8 10*3/uL — ABNORMAL HIGH (ref 4.0–10.5)
nRBC: 0 % (ref 0.0–0.2)

## 2019-03-02 LAB — SARS CORONAVIRUS 2 BY RT PCR (HOSPITAL ORDER, PERFORMED IN ~~LOC~~ HOSPITAL LAB): SARS Coronavirus 2: NEGATIVE

## 2019-03-02 LAB — ETHANOL: Alcohol, Ethyl (B): <10 mg/dL (ref <10)

## 2019-03-02 LAB — SALICYLATE LEVEL: Salicylate Lvl: <7 mg/dL (ref 2.8–30.0)

## 2019-03-02 LAB — ACETAMINOPHEN LEVEL: Acetaminophen (Tylenol), Serum: <10 ug/mL — ABNORMAL LOW (ref 10–30)

## 2019-03-02 MED ORDER — POTASSIUM CHLORIDE CRYS ER 20 MEQ PO TBCR
40.0000 meq | EXTENDED_RELEASE_TABLET | Freq: Once | ORAL | Status: AC
  Administered 2019-03-02: 40 meq via ORAL
  Filled 2019-03-02: qty 2

## 2019-03-02 NOTE — ED Triage Notes (Signed)
Patient here from work with complaints of anxiety. Also reports that "there is something going on in my head and I dont know what or why". States he would like to get checked for mental health issues and medical issues.

## 2019-03-02 NOTE — ED Notes (Signed)
Pt is calm and cooperative.  Denies S/I and H/I .   Pts only complaint is head ache.

## 2019-03-02 NOTE — Progress Notes (Addendum)
Patient ID: Donald Jenkins, male   DOB: 02/16/1971, 48 y.o.   MRN: [id number]  Pt was seen via telepsych and chart reviewed with treatment team and Dr Mariea Clonts.  Pt denies suicidal/homicidal ideation, denies auditory/visual hallucinations and does not appear to be responding to internal stimuli. Pt stated he feels like his head is pulsating and he just wants his pain to stop. Pt did have a K+ of 3.0 and received oral potassium supplement. Pt's UDS is positive for THC but no other substances and BAL is negative. He stated he felt bad at work because of a headache and this is why he came to the emergency room. He does have a history of a T8 fracture and was hospitalized from 6-10 to 12-15-2018. He did state today that he is unable to sleep because of his back pain and his headache. He also has a history of anxiety disorder and sees Dr Creig Hines for this. He adamantly denies that he is suicidal today. Pt is psychiatrically clear.   Ethelene Hal, PMHNP-BC 03/02/2019      1334   Patient's chart reviewed. Reviewed the information documented and agree with the treatment plan.  Buford Dresser, DO 03/02/19 3:56 PM

## 2019-03-02 NOTE — Discharge Instructions (Signed)
For your behavioral health needs, you are advised to continue treatment with Dustin Flock, MD:       [facility]      [address] [place], [place] [address]      [phone]

## 2019-03-02 NOTE — ED Notes (Signed)
Lunch tray ordered for patient.

## 2019-03-02 NOTE — BH Assessment (Signed)
[facility] Assessment Progress Note  Per Jinny Blossom, PMHNP, this pt does not require psychiatric hospitalization at this time.  Pt is psychiatrically cleared.  Discharge instructions advise pt to continue treatment with his current provider, whom pt identifies as Dustin Flock, MD at [facility].  This has been included in pt's discharge instructions.  Pt's nurse, Nena Jordan, has been notified.  Jalene Mullet, [place] Triage Specialist [phone]

## 2019-03-02 NOTE — ED Provider Notes (Signed)
[facility] DEPT Provider Note   CSN: [id number] Arrival date & time: 03/02/19  [id number]     History   Chief Complaint Chief Complaint  Patient presents with  . Head is Pulsating    HPI Donald Jenkins is a 48 y.o. male.     HPI Pt was seen at 0750. Per pt, c/o gradual onset and worsening of persistent anxiety for unknown period of time. Pt states "there is something going on in my head." States his "head is pulsating" which causes the rest of his body to pulsate. Unclear when this started, other than "for a while now," as pt tells me he cannot be more specific due to "having problems with time." Pt cannot tell me if he has recently been evaluated by a medical provider for his symptoms. Denies CP/palpitations, no SOB/cough, no abd pain, no N/V/D, no fevers, no rash, no neck pain, no injury, no visual changes, no focal motor weakness, no tingling/numbness in extremities, no ataxia, no slurred speech, no facial droop.     Past Medical History:  Diagnosis Date  . Anxiety   . Hypertension   . Tachycardia     Patient Active Problem List   Diagnosis Date Noted  . Acute midline thoracic back pain 01/18/2019  . Hypomagnesemia   . Fall   . Hypoglycemia 12/09/2018  . Bacteremia 12/09/2018  . Acute metabolic encephalopathy [id number]  . Metabolic acidosis [id number]  . Compression fracture of body of thoracic vertebra ([place]) 12/08/2018  . Rhabdomyolysis 12/07/2018  . SIRS (systemic inflammatory response syndrome) ([place]) 12/07/2018  . Back pain 12/07/2018  . Depression with anxiety 12/07/2018  . Abnormal LFTs 12/07/2018  . AKI (acute kidney injury) ([place]) 12/07/2018  . Elevated troponin 12/07/2018  . Hypersomnolence disorder, with mental disorder, acute, moderate 11/01/2018  . Cholecystitis 06/05/2018  . Recurrent major depression in full remission ([place]) 04/05/2018  . Social anxiety disorder 03/18/2018  . Panic disorder 03/18/2018  . Essential  hypertension, benign 06/06/2015    Past Surgical History:  Procedure Laterality Date  . CHOLECYSTECTOMY N/A 06/05/2018   Procedure: LAPAROSCOPIC CHOLECYSTECTOMY WITH POSSIBLE INTRAOPERATIVE CHOLANGIOGRAM;  Surgeon: Clovis Riley, MD;  Location: [facility] OR;  Service: General;  Laterality: N/A;  . IR VERTEBROPLASTY CERV/THOR BX INC UNI/BIL INC/INJECT/IMAGING  12/13/2018  . KNEE SURGERY          Home Medications    Prior to Admission medications   Medication Sig Start Date End Date Taking? Authorizing Provider  ALPRAZolam Duanne Moron) 1 MG tablet Take 1 tablet (1 mg total) by mouth 3 (three) times daily as needed for anxiety. Patient not taking: Reported on 01/18/2019 11/01/18   Delight Hoh, MD  amLODipine (NORVASC) 5 MG tablet Take 1 tablet (5 mg total) by mouth daily. To help lower blood pressure 01/18/19   Fulp, Cammie, MD  aspirin EC 81 MG EC tablet Take 1 tablet (81 mg total) by mouth daily. Patient not taking: Reported on 01/18/2019 12/15/18   Kayleen Memos, DO  cyclobenzaprine (FLEXERIL) 5 MG tablet May take one pill up to 3 times per day as needed for muscle spasm 01/18/19   Fulp, Cammie, MD  hydrOXYzine (ATARAX/VISTARIL) 10 MG tablet Once every 6-8 hours as needed for acute anxiety 01/18/19   Fulp, Cammie, MD  ibuprofen (ADVIL) 800 MG tablet One pill every 8 hours as needed for pain; take after eating 01/18/19   Fulp, Cammie, MD  levocetirizine (XYZAL) 5 MG tablet Take 1 tablet (5 mg total)  by mouth every evening. To help with nasal allergies 01/18/19   Fulp, Cammie, MD  methylphenidate (RITALIN) 5 MG tablet Take 1 tablet (5 mg total) by mouth 3 (three) times daily with meals. Patient not taking: Reported on 12/22/2018 11/16/18   Delight Hoh, MD  mirtazapine (REMERON) 15 MG tablet Take 1 tablet (15 mg total) by mouth at bedtime. 01/18/19   Fulp, Cammie, MD  oxyCODONE (OXY IR/ROXICODONE) 5 MG immediate release tablet Take 1 tablet (5 mg total) by mouth 2 (two) times daily as needed for up  to 10 doses for severe pain. Patient not taking: Reported on 12/22/2018 12/15/18   Kayleen Memos, DO    Family History Family History  Problem Relation Age of Onset  . Lung cancer Father   . Esophageal cancer Father   . Brain cancer Father     Social History Social History   Tobacco Use  . Smoking status: Former Research scientist (life sciences)  . Smokeless tobacco: Never Used  Substance Use Topics  . Alcohol use: Yes    Alcohol/week: 0.0 standard drinks    Comment: socially  . Drug use: Not Currently    Types: Marijuana     Allergies   Penicillins   Review of Systems Review of Systems ROS: Statement: All systems negative except as marked or noted in the HPI; Constitutional: Negative for fever and chills. ; ; Eyes: Negative for eye pain, redness and discharge. ; ; ENMT: Negative for ear pain, hoarseness, nasal congestion, sinus pressure and sore throat. ; ; Cardiovascular: Negative for chest pain, palpitations, diaphoresis, dyspnea and peripheral edema. ; ; Respiratory: Negative for cough, wheezing and stridor. ; ; Gastrointestinal: Negative for nausea, vomiting, diarrhea, abdominal pain, blood in stool, hematemesis, jaundice and rectal bleeding. . ; ; Genitourinary: Negative for dysuria, flank pain and hematuria. ; ; Musculoskeletal: Negative for back pain and neck pain. Negative for swelling and trauma.; ; Skin: Negative for pruritus, rash, abrasions, blisters, bruising and skin lesion.; ; Neuro: +"pulsing all over body." Negative for headache, lightheadedness and neck stiffness. Negative for weakness, altered level of consciousness, altered mental status, extremity weakness, paresthesias, involuntary movement, seizure and syncope.; Psych:  +anxiety. No SI, no SA, no HI, no hallucinations.      Physical Exam Updated Vital Signs BP (!) 153/96 (BP Location: Left Arm)   Pulse 85   Temp 97.7 F (36.5 C) (Oral)   Resp 20   SpO2 100%     Physical Exam 0755: Physical examination:  Nursing notes  reviewed; Vital signs and O2 SAT reviewed;  Constitutional: Well developed, Well nourished, Well hydrated, In no acute distress; Head:  Normocephalic, atraumatic; Eyes: EOMI, PERRL, No scleral icterus; ENMT: Mouth and pharynx normal, Mucous membranes moist; Neck: Supple, no meningeal signs. Full range of motion, No lymphadenopathy; Cardiovascular: Regular rate and rhythm, No gallop; Respiratory: Breath sounds clear & equal bilaterally, No wheezes.  Speaking full sentences with ease, Normal respiratory effort/excursion; Chest: Nontender, Movement normal; Abdomen: Soft, Nontender, Nondistended, Normal bowel sounds; Genitourinary: No CVA tenderness; Extremities: Peripheral pulses normal, No tenderness, No edema, No calf edema or asymmetry.; Neuro: AA&Ox3, Major CN grossly intact. No facial droop.  Speech clear. No gross focal motor or sensory deficits in extremities.; Skin: Color normal, Warm, Dry.; Psych:  Affect flat, poor eye contact.    ED Treatments / Results  Labs (all labs ordered are listed, but only abnormal results are displayed)   EKG None  Radiology   Procedures Procedures (including critical care time)  Medications Ordered in ED Medications - No data to display   Initial Impression / Assessment and Plan / ED Course  I have reviewed the triage vital signs and the nursing notes.  Pertinent labs & imaging results that were available during my care of the patient were reviewed by me and considered in my medical decision making (see chart for details).     MDM Reviewed: previous chart, nursing note and vitals Reviewed previous: labs Interpretation: labs and CT scan   Results for orders placed or performed during the hospital encounter of 03/02/19  Acetaminophen level  Result Value Ref Range   Acetaminophen (Tylenol), Serum <10 (L) 10 - 30 ug/mL  Comprehensive metabolic panel  Result Value Ref Range   Sodium 140 135 - 145 mmol/L   Potassium 3.0 (L) 3.5 - 5.1 mmol/L    Chloride 97 (L) 98 - 111 mmol/L   CO2 26 22 - 32 mmol/L   Glucose, Bld 105 (H) 70 - 99 mg/dL   BUN 21 (H) 6 - 20 mg/dL   Creatinine, Ser 0.67 0.61 - 1.24 mg/dL   Calcium 9.1 8.9 - 10.3 mg/dL   Total Protein 8.3 (H) 6.5 - 8.1 g/dL   Albumin 4.6 3.5 - 5.0 g/dL   AST 69 (H) 15 - 41 U/L   ALT 34 0 - 44 U/L   Alkaline Phosphatase 85 38 - 126 U/L   Total Bilirubin 1.2 0.3 - 1.2 mg/dL   GFR calc non Af Amer >60 >60 mL/min   GFR calc Af Amer >60 >60 mL/min   Anion gap 17 (H) 5 - 15  Ethanol  Result Value Ref Range   Alcohol, Ethyl (B) Q000111Q Q000111Q mg/dL  Salicylate level  Result Value Ref Range   Salicylate Lvl Q000111Q 2.8 - 30.0 mg/dL  CBC with Differential  Result Value Ref Range   WBC 11.8 (H) 4.0 - 10.5 K/uL   RBC 4.81 4.22 - 5.81 MIL/uL   Hemoglobin 14.4 13.0 - 17.0 g/dL   HCT 40.9 39.0 - 52.0 %   MCV 85.0 80.0 - 100.0 fL   MCH 29.9 26.0 - 34.0 pg   MCHC 35.2 30.0 - 36.0 g/dL   RDW 12.3 11.5 - 15.5 %   Platelets 308 150 - 400 K/uL   nRBC 0.0 0.0 - 0.2 %   Neutrophils Relative % 86 %   Neutro Abs 10.1 (H) 1.7 - 7.7 K/uL   Lymphocytes Relative 9 %   Lymphs Abs 1.1 0.7 - 4.0 K/uL   Monocytes Relative 5 %   Monocytes Absolute 0.6 0.1 - 1.0 K/uL   Eosinophils Relative 0 %   Eosinophils Absolute 0.0 0.0 - 0.5 K/uL   Basophils Relative 0 %   Basophils Absolute 0.0 0.0 - 0.1 K/uL   Immature Granulocytes 0 %   Abs Immature Granulocytes 0.04 0.00 - 0.07 K/uL  Urine rapid drug screen (hosp performed)  Result Value Ref Range   Opiates NONE DETECTED NONE DETECTED   Cocaine NONE DETECTED NONE DETECTED   Benzodiazepines NONE DETECTED NONE DETECTED   Amphetamines NONE DETECTED NONE DETECTED   Tetrahydrocannabinol POSITIVE (A) NONE DETECTED   Barbiturates NONE DETECTED NONE DETECTED   Ct Head Wo Contrast Result Date: 03/02/2019 CLINICAL DATA:  Anxiety. EXAM: CT HEAD WITHOUT CONTRAST TECHNIQUE: Contiguous axial images were obtained from the base of the skull through the vertex without  intravenous contrast. COMPARISON:  December 08, 2018 FINDINGS: Brain: No evidence of acute infarction, hemorrhage, hydrocephalus, extra-axial collection or  mass lesion/mass effect. Vascular: No hyperdense vessel or unexpected calcification. Skull: Normal. Negative for fracture or focal lesion. Sinuses/Orbits: No acute finding. Other: None. IMPRESSION: No acute intracranial abnormality. Electronically Signed   By: Fidela Salisbury M.D.   On: 03/02/2019 11:23    ASSAD MUHAMMED was evaluated in Emergency Department on 03/02/2019 for the symptoms described in the history of present illness. He was evaluated in the context of the global COVID-19 pandemic, which necessitated consideration that the patient might be at risk for infection with the SARS-CoV-2 virus that causes COVID-19. Institutional protocols and algorithms that pertain to the evaluation of patients at risk for COVID-19 are in a state of rapid change based on information released by regulatory bodies including the CDC and federal and state organizations. These policies and algorithms were followed during the patient's care in the ED.   1140:  Potassium repleted PO. Workup otherwise reassuring. Will have TTS evaluate.   1425:  TTS has evaluated pt: requests COVID testing.   1515:  COVID testing pending. Likely d/c. Sign out to Dr. Maryan Rued.    Final Clinical Impressions(s) / ED Diagnoses   Final diagnoses:  None    ED Discharge Orders    None       Francine Graven, DO 03/02/19 1520

## 2019-03-02 NOTE — ED Notes (Signed)
Mother at bedside with patient

## 2019-03-02 NOTE — Progress Notes (Signed)
   03/02/19 1224  General Assessment Data  Reason for not completing assessment [facility] contacted pt's nurse and charge nurse multiple times to assess pt.  Pt was not able to asses due to being in a hallway bed. TTS will complete assessment when pt is placed in an appropriate setting.    Forestine Macho L. [place], [place], [facility], [facility] Therapeutic Triage Specialist  [phone]

## 2019-03-02 NOTE — ED Notes (Signed)
Pt discharged safely with instructions to follow up with Primary MD and Psychiatrist.  All belongings were returned to pt.  Pts mother was to pick up.

## 2019-03-02 NOTE — ED Notes (Signed)
Patient reported to the RN that he had all his belongings on him.

## 2019-03-02 NOTE — BH Assessment (Signed)
Tele Assessment Note   Patient Name: Donald Jenkins MRN: [id number] Referring Physician: Francine Graven, DO Location of Patient: [facility] Location of Provider: [place] is a 48 y.o. male who voluntarily came to  [facility] with anxiety due to a headache.  Pt states, "I couldn't function at work. I keep feeling spaced out.  I feel like my brain is on fire, like there is a throbbing that won't stop.  I can't remember anything."  Pt reports occasional polysubstance use.  Pt states. "I drink I alcohol (unknown amount), the last time was a week ago and I smoke marijuana (unknown amount), the last time was last month."  Pt denies SI/HI/A/V-hallucinations.    Pt is single and reside alone.  Pt is employed full time.  Pt receive outpatient medication management with Dr. Candis Schatz at [place].  Pt denies having a history of inpatient MH/SA treatment.  Pt denies having a history of physical, sexual, and verbal abuse  Patient was wearing casual clothes and appeared appropriately groomed.  Pt was alert throughout the assessment.  Patient made good eye contact and had normal psychomotor activity.  Patient spoke in a normal voice without pressured speech.  Pt expressed feeling in pain.  Pt's affect appeared euthymic and congruent with stated mood. Pt's thought process was coherent and logical.  Pt presented with good insight and judgement.  Pt did not appear to be responding to internal stimuli.  Pt was able to contract for safety.   Disposition: [facility] discussed case with [place] Provider, Jinny Blossom, NP who psychiatrically cleared patient.  Diagnosis: F41.1 Generalized Anxiety Disorder  Past Medical History:  Past Medical History:  Diagnosis Date  . Anxiety   . Hypertension   . Tachycardia     Past Surgical History:  Procedure Laterality Date  . CHOLECYSTECTOMY N/A 06/05/2018   Procedure: LAPAROSCOPIC CHOLECYSTECTOMY WITH  POSSIBLE INTRAOPERATIVE CHOLANGIOGRAM;  Surgeon: Clovis Riley, MD;  Location: [facility] OR;  Service: General;  Laterality: N/A;  . IR VERTEBROPLASTY CERV/THOR BX INC UNI/BIL INC/INJECT/IMAGING  12/13/2018  . KNEE SURGERY      Family History:  Family History  Problem Relation Age of Onset  . Lung cancer Father   . Esophageal cancer Father   . Brain cancer Father     Social History:  reports that he has quit smoking. He has never used smokeless tobacco. He reports current alcohol use. He reports previous drug use. Drug: Marijuana.  Additional Social History:  Alcohol / Drug Use Pain Medications: See MARs Prescriptions: See MARs Over the Counter: See MARs History of alcohol / drug use?: Yes Substance #1 Name of Substance 1: Alcohol 1 - Age of First Use: unknown 1 - Amount (size/oz): unknown 1 - Frequency: socially 1 - Duration: ongoin 1 - Last Use / Amount: last week Substance #2 Name of Substance 2: Cannabis 2 - Age of First Use: unknown 2 - Amount (size/oz): unknown 2 - Frequency: 1 week 2 - Duration: ongoing 2 - Last Use / Amount: last month  CIWA: CIWA-Ar BP: (!) 162/89 Pulse Rate: 100 COWS:    Allergies:  Allergies  Allergen Reactions  . Penicillins Other (See Comments)    Did it involve swelling of the face/tongue/throat, SOB, or low BP? Unknown Did it involve sudden or severe rash/hives, skin peeling, or any reaction on the inside of your mouth or nose? Unknown Did you need to seek medical attention at a hospital or doctor's office?  Unknown When did it last happen?childhood If all above answers are "NO", may proceed with cephalosporin use.     Home Medications: (Not in a hospital admission)   OB/GYN Status:  No LMP for male patient.  General Assessment Data Assessment unable to be completed: Yes Reason for not completing assessment: [facility] attempt contacted pt's nurse and charge nurse multiple times to assess pt.  Pt was not able to asses due to being  in a hallway bed. TTS will complete assessment when pt is placed in an appropriate setting. Location of Assessment: [facility] TTS Assessment: In system Is this a Tele or Face-to-Face Assessment?: Tele Assessment Is this an Initial Assessment or a Re-assessment for this encounter?: Initial Assessment Patient Accompanied by:: N/A Language Other than English: No Living Arrangements: Other (Comment)(alone) What gender do you identify as?: Male Marital status: Single Living Arrangements: Alone Can pt return to current living arrangement?: Yes Admission Status: Voluntary Is patient capable of signing voluntary admission?: Yes Referral Source: Self/Family/Friend     Crisis Care Plan Living Arrangements: Alone Legal Guardian: Other:(Self) Name of Psychiatrist: Dr Nicki Reaper Cunningham([facility])  Education Status Is patient currently in school?: No Is the patient employed, unemployed or receiving disability?: Employed  Risk to self with the past 6 months Suicidal Ideation: No Has patient been a risk to self within the past 6 months prior to admission? : No Suicidal Intent: No Has patient had any suicidal intent within the past 6 months prior to admission? : No Is patient at risk for suicide?: No Suicidal Plan?: No Has patient had any suicidal plan within the past 6 months prior to admission? : No Access to Means: No Previous Attempts/Gestures: No Triggers for Past Attempts: None known Intentional Self Injurious Behavior: None Family Suicide History: No Persecutory voices/beliefs?: No Depression: No Depression Symptoms: Insomnia Substance abuse history and/or treatment for substance abuse?: No Suicide prevention information given to non-admitted patients: Not applicable  Risk to Others within the past 6 months Homicidal Ideation: No Does patient have any lifetime risk of violence toward others beyond the six months prior to admission? : No Thoughts of Harm to Others:  No Current Homicidal Intent: No Current Homicidal Plan: No Access to Homicidal Means: No History of harm to others?: No Assessment of Violence: None Noted Does patient have access to weapons?: No Criminal Charges Pending?: No Does patient have a court date: No Is patient on probation?: No  Psychosis Hallucinations: None noted Delusions: None noted  Mental Status Report Appearance/Hygiene: Unremarkable Eye Contact: Fair Motor Activity: Freedom of movement Speech: Logical/coherent Level of Consciousness: Alert, Quiet/awake Mood: Pleasant Affect: Appropriate to circumstance Anxiety Level: None Thought Processes: Coherent, Relevant Judgement: Unimpaired Orientation: Person, Place, Time, Appropriate for developmental age Obsessive Compulsive Thoughts/Behaviors: None  Cognitive Functioning Concentration: Normal Memory: Recent Intact, Remote Intact Is patient IDD: No Insight: Good Impulse Control: Fair Appetite: Fair Have you had any weight changes? : No Change Sleep: Decreased Total Hours of Sleep: 0(head hurting 3 nights) Vegetative Symptoms: None  ADLScreening [facility] Assessment Services) Patient's cognitive ability adequate to safely complete daily activities?: Yes Patient able to express need for assistance with ADLs?: Yes Independently performs ADLs?: Yes (appropriate for developmental age)  Prior Inpatient Therapy Prior Inpatient Therapy: No  Prior Outpatient Therapy Prior Outpatient Therapy: Yes Prior Therapy Dates: ongoing Prior Therapy Facilty/Provider(s): Dr Cunningham([facility]) Reason for Treatment: Medication management Does patient have an ACCT team?: No Does patient have Intensive In-House Services?  : No Does patient  have [facility] services? : No Does patient have P4CC services?: No  ADL Screening (condition at time of admission) Patient's cognitive ability adequate to safely complete daily activities?: Yes Is the patient deaf or  have difficulty hearing?: No Does the patient have difficulty seeing, even when wearing glasses/contacts?: No Does the patient have difficulty concentrating, remembering, or making decisions?: Yes Patient able to express need for assistance with ADLs?: Yes Does the patient have difficulty dressing or bathing?: No Independently performs ADLs?: Yes (appropriate for developmental age) Does the patient have difficulty walking or climbing stairs?: No Weakness of Legs: None Weakness of Arms/Hands: None  Home Assistive Devices/Equipment Home Assistive Devices/Equipment: None    Abuse/Neglect Assessment (Assessment to be complete while patient is alone) Abuse/Neglect Assessment Can Be Completed: Yes Physical Abuse: Denies Verbal Abuse: Denies Sexual Abuse: Denies Exploitation of patient/patient's resources: Denies     Regulatory affairs officer (For Healthcare) Does Patient Have a Medical Advance Directive?: No Would patient like information on creating a medical advance directive?: No - Patient declined Nutrition Screen- [place] Adult/[facility] Patient's home diet: NPO        Disposition: [facility] discussed case with [place] Provider, Jinny Blossom, NP who psychiatrically cleared patient.  Disposition Initial Assessment Completed for this Encounter: Yes Disposition of Patient: Discharge(Per Jinny Blossom, NP)  This service was provided via telemedicine using a 2-way, interactive audio and video technology.  Names of all persons participating in this telemedicine service and their role in this encounter. Name: Jaydn Smelley. [place] Role: Patient  Name: Sylvester Harder, MS, [facility], [place] Role: Triage Specialist  Name: Jinny Blossom, NP Role: [facility] Provider  Name:  Role:     Sylvester Harder, MS, [facility], [place] 03/02/2019 1:38 PM

## 2019-03-03 ENCOUNTER — Encounter (HOSPITAL_COMMUNITY): Payer: Self-pay

## 2019-03-03 ENCOUNTER — Ambulatory Visit (HOSPITAL_COMMUNITY)
Admission: EM | Admit: 2019-03-03 | Discharge: 2019-03-03 | Disposition: A | Discharge status: Home / Self Care | Payer: BC Managed Care – PPO | Attending: Family Medicine | Admitting: Family Medicine

## 2019-03-03 ENCOUNTER — Other Ambulatory Visit: Payer: Self-pay

## 2019-03-03 DIAGNOSIS — R413 Other amnesia: Secondary | ICD-10-CM

## 2019-03-03 NOTE — ED Triage Notes (Signed)
Pt has complaint of "not feeling like his self", memory issues, feeling spaced out,  and " losing time and days with out notice".

## 2019-03-03 NOTE — Discharge Instructions (Addendum)
See your doctor on Tuesday

## 2019-03-03 NOTE — ED Provider Notes (Signed)
[place]    CSN: [id number] Arrival date & time: 03/03/19  0850      History   Chief Complaint Chief Complaint  Patient presents with  . Anxiety    HPI MERRIC ANSARI is a 48 y.o. male.   HPI  Pleasant 48 year old gentleman who is under the care of psychiatry for social anxiety disorder, insomnia, depression, panic.  He is compliant with his medical visits.  He is compliant with his medical medication regimen.  He was seen in the emergency room yesterday.  He states that he feels like there is something wrong with him.  He cannot put his finger on it.  He does not feel himself.  He states that he is having periods of time that he has no memory of.  He does not usually have trouble with amnesia.  He states he is forgetful.  He states that during conversation he cannot find the right words.  He states that he feels like there is something wrong in his brain.  At the emergency room he had blood work, physical examination, and a CT is head.  Everything was normal, or as expected.  Toxicology screen positive for marijuana.  He states that he does drink alcohol about once a week.  He also takes both Sonata and alprazolam.  We discussed that this medication could contribute to amnesia. He denies any physical symptoms.  Eating well.  Sleeping as well as he ever does.  Trying to stay active. He is now unable to work because of his symptoms.  His mother has come to stay with him for a few days He denies any recent stress or event that would cause him to be more upset No recent change in medicines, no supplements Past Medical History:  Diagnosis Date  . Anxiety   . Hypertension   . Tachycardia     Patient Active Problem List   Diagnosis Date Noted  . Acute midline thoracic back pain 01/18/2019  . Hypomagnesemia   . Fall   . Hypoglycemia 12/09/2018  . Bacteremia 12/09/2018  . Acute metabolic encephalopathy [id number]  . Metabolic acidosis [id number]  . Compression  fracture of body of thoracic vertebra ([place]) 12/08/2018  . Rhabdomyolysis 12/07/2018  . SIRS (systemic inflammatory response syndrome) ([place]) 12/07/2018  . Back pain 12/07/2018  . Depression with anxiety 12/07/2018  . Abnormal LFTs 12/07/2018  . AKI (acute kidney injury) ([place]) 12/07/2018  . Elevated troponin 12/07/2018  . Hypersomnolence disorder, with mental disorder, acute, moderate 11/01/2018  . Cholecystitis 06/05/2018  . Recurrent major depression in full remission ([place]) 04/05/2018  . Social anxiety disorder 03/18/2018  . Panic disorder 03/18/2018  . Essential hypertension, benign 06/06/2015    Past Surgical History:  Procedure Laterality Date  . CHOLECYSTECTOMY N/A 06/05/2018   Procedure: LAPAROSCOPIC CHOLECYSTECTOMY WITH POSSIBLE INTRAOPERATIVE CHOLANGIOGRAM;  Surgeon: Clovis Riley, MD;  Location: [facility] OR;  Service: General;  Laterality: N/A;  . IR VERTEBROPLASTY CERV/THOR BX INC UNI/BIL INC/INJECT/IMAGING  12/13/2018  . KNEE SURGERY         Home Medications    Prior to Admission medications   Medication Sig Start Date End Date Taking? Authorizing Provider  ALPRAZolam Duanne Moron) 1 MG tablet Take 1 tablet (1 mg total) by mouth 3 (three) times daily as needed for anxiety. 11/01/18   Delight Hoh, MD  amLODipine (NORVASC) 5 MG tablet Take 1 tablet (5 mg total) by mouth daily. To help lower blood pressure 01/18/19   Antony Blackbird, MD  levocetirizine (XYZAL) 5 MG tablet Take 1 tablet (5 mg total) by mouth every evening. To help with nasal allergies Patient not taking: Reported on 03/02/2019 01/18/19   Fulp, Cammie, MD  montelukast (SINGULAIR) 10 MG tablet Take 10 mg by mouth daily. 02/25/19   [provider]  zaleplon (SONATA) 10 MG capsule Take 10 mg by mouth at bedtime as needed for sleep.  02/16/19   [provider]  methylphenidate (RITALIN) 5 MG tablet Take 1 tablet (5 mg total) by mouth 3 (three) times daily with meals. Patient not taking: Reported on 12/22/2018  11/16/18 03/03/19  Delight Hoh, MD  mirtazapine (REMERON) 15 MG tablet Take 1 tablet (15 mg total) by mouth at bedtime. Patient not taking: Reported on 03/02/2019 01/18/19 03/03/19  Antony Blackbird, MD    Family History Family History  Problem Relation Age of Onset  . Lung cancer Father   . Esophageal cancer Father   . Brain cancer Father     Social History Social History   Tobacco Use  . Smoking status: Former Research scientist (life sciences)  . Smokeless tobacco: Never Used  Substance Use Topics  . Alcohol use: Yes    Alcohol/week: 0.0 standard drinks    Comment: socially  . Drug use: Not Currently    Types: Marijuana     Allergies   Penicillins   Review of Systems Review of Systems  Constitutional: Negative for chills and fever.  HENT: Negative for ear pain and sore throat.   Eyes: Negative for pain and visual disturbance.  Respiratory: Negative for cough and shortness of breath.   Cardiovascular: Negative for chest pain and palpitations.  Gastrointestinal: Negative for abdominal pain and vomiting.  Genitourinary: Negative for dysuria and hematuria.  Musculoskeletal: Negative for arthralgias and back pain.  Skin: Negative for color change and rash.  Neurological: Negative for seizures and syncope.  Psychiatric/Behavioral: Positive for confusion, decreased concentration, dysphoric mood and sleep disturbance. The patient is nervous/anxious.   All other systems reviewed and are negative.    Physical Exam Triage Vital Signs ED Triage Vitals  Enc Vitals Group     BP 03/03/19 0908 (!) 145/91     Pulse Rate 03/03/19 0908 85     Resp 03/03/19 0908 17     Temp 03/03/19 0908 98.6 F (37 C)     Temp Source 03/03/19 0908 Oral     SpO2 03/03/19 0908 96 %     Weight --      Height --      Head Circumference --      Peak Flow --      Pain Score 03/03/19 0913 0     Pain Loc --      Pain Edu? --      Excl. in [place]? --    No data found.  Updated Vital Signs BP (!) 145/91 (BP Location: Right  Arm)   Pulse 85   Temp 98.6 F (37 C) (Oral)   Resp 17   SpO2 96%   Physical Exam Constitutional:      General: He is not in acute distress.    Appearance: He is well-developed.  HENT:     Head: Normocephalic and atraumatic.  Eyes:     Conjunctiva/sclera: Conjunctivae normal.     Pupils: Pupils are equal, round, and reactive to light.  Neck:     Musculoskeletal: Normal range of motion.  Cardiovascular:     Rate and Rhythm: Normal rate and regular rhythm.  Heart sounds: Normal heart sounds.  Pulmonary:     Effort: Pulmonary effort is normal. No respiratory distress.     Breath sounds: Normal breath sounds.  Abdominal:     General: There is no distension.     Palpations: Abdomen is soft.  Musculoskeletal: Normal range of motion.  Lymphadenopathy:     Cervical: No cervical adenopathy.  Skin:    General: Skin is warm and dry.  Neurological:     Mental Status: He is alert.  Psychiatric:        Attention and Perception: Attention normal.        Mood and Affect: Mood is anxious.        Speech: Speech is delayed.        Behavior: Behavior normal.        Thought Content: Thought content is not paranoid or delusional. Thought content does not include homicidal or suicidal ideation. Thought content does not include homicidal or suicidal plan.        Cognition and Memory: Cognition normal. Memory is impaired.     Comments: Patient is hesitant as he answers questions.  He does appear to be anxious.  He sometimes searches for words and seems frustrated when he is unable to speak fluently.      UC Treatments / Results  Labs (all labs ordered are listed, but only abnormal results are displayed) Labs Reviewed - No data to display  EKG   Radiology Ct Head Wo Contrast  Result Date: 03/02/2019 CLINICAL DATA:  Anxiety. EXAM: CT HEAD WITHOUT CONTRAST TECHNIQUE: Contiguous axial images were obtained from the base of the skull through the vertex without intravenous contrast.  COMPARISON:  December 08, 2018 FINDINGS: Brain: No evidence of acute infarction, hemorrhage, hydrocephalus, extra-axial collection or mass lesion/mass effect. Vascular: No hyperdense vessel or unexpected calcification. Skull: Normal. Negative for fracture or focal lesion. Sinuses/Orbits: No acute finding. Other: None. IMPRESSION: No acute intracranial abnormality. Electronically Signed   By: Fidela Salisbury M.D.   On: 03/02/2019 11:23    Procedures Procedures (including critical care time)  Medications Ordered in UC Medications - No data to display  Initial Impression / Assessment and Plan / UC Course  I have reviewed the triage vital signs and the nursing notes.  Pertinent labs & imaging results that were available during my care of the patient were reviewed by me and considered in my medical decision making (see chart for details).     I called the behavioral health provider that sees Mr. Luciana Axe on a ongoing basis.  I told him that I believe that his problem is more emotional than physical.  I have encouraged him to stay home and rest, spent time with his mother, Tim.try to go back to work until he is seen by his doctor. Final Clinical Impressions(s) / UC Diagnoses   Final diagnoses:  Memory impairment     Discharge Instructions     See your doctor on Tuesday    ED Prescriptions    None     Controlled Substance Prescriptions [place] Controlled Substance Registry consulted? Not Applicable   Raylene Everts, MD 03/03/19 1134

## 2019-03-07 ENCOUNTER — Ambulatory Visit (INDEPENDENT_AMBULATORY_CARE_PROVIDER_SITE_OTHER): Payer: BC Managed Care – PPO | Admitting: Psychiatry

## 2019-03-07 ENCOUNTER — Other Ambulatory Visit: Payer: Self-pay

## 2019-03-07 ENCOUNTER — Encounter: Payer: Self-pay | Admitting: Psychiatry

## 2019-03-07 VITALS — Ht 72.0 in | Wt 183.0 lb

## 2019-03-07 DIAGNOSIS — F5113 Hypersomnia due to other mental disorder: Secondary | ICD-10-CM

## 2019-03-07 DIAGNOSIS — F4010 Social phobia, unspecified: Secondary | ICD-10-CM | POA: Diagnosis not present

## 2019-03-07 DIAGNOSIS — F3342 Major depressive disorder, recurrent, in full remission: Secondary | ICD-10-CM

## 2019-03-07 DIAGNOSIS — F441 Dissociative fugue: Secondary | ICD-10-CM | POA: Diagnosis not present

## 2019-03-07 DIAGNOSIS — F410 Panic disorder [episodic paroxysmal anxiety] without agoraphobia: Secondary | ICD-10-CM | POA: Diagnosis not present

## 2019-03-07 DIAGNOSIS — F99 Mental disorder, not otherwise specified: Secondary | ICD-10-CM

## 2019-03-07 DIAGNOSIS — G4710 Hypersomnia, unspecified: Secondary | ICD-10-CM

## 2019-03-07 MED ORDER — QUETIAPINE FUMARATE 25 MG PO TABS: 25.0000 mg | ORAL_TABLET | Freq: Every day | ORAL | 0 refills | Status: DC
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
## 2019-03-09 ENCOUNTER — Other Ambulatory Visit: Payer: Self-pay

## 2019-03-09 ENCOUNTER — Ambulatory Visit: Payer: BC Managed Care – PPO | Attending: Family Medicine | Admitting: Family Medicine

## 2019-03-09 ENCOUNTER — Encounter: Payer: Self-pay | Admitting: Family Medicine

## 2019-03-09 VITALS — BP 123/75 | HR 68 | Temp 98.2°F | Ht 72.0 in | Wt 186.8 lb

## 2019-03-09 DIAGNOSIS — Z114 Encounter for screening for human immunodeficiency virus [HIV]: Secondary | ICD-10-CM

## 2019-03-09 DIAGNOSIS — S22060D Wedge compression fracture of T7-T8 vertebra, subsequent encounter for fracture with routine healing: Secondary | ICD-10-CM

## 2019-03-09 DIAGNOSIS — I10 Essential (primary) hypertension: Secondary | ICD-10-CM | POA: Diagnosis not present

## 2019-03-09 DIAGNOSIS — R202 Paresthesia of skin: Secondary | ICD-10-CM | POA: Diagnosis not present

## 2019-03-09 DIAGNOSIS — R413 Other amnesia: Secondary | ICD-10-CM

## 2019-03-09 DIAGNOSIS — E876 Hypokalemia: Secondary | ICD-10-CM

## 2019-03-09 NOTE — Progress Notes (Signed)
Patient fell on steps and re injured his back.  Patient states that he does not remember the 5 days after the fall.  Patient states that his bottom lip is numb.

## 2019-03-09 NOTE — Patient Instructions (Signed)
Paresthesia Paresthesia is a burning or prickling feeling. This feeling can happen in any part of the body. It often happens in the hands, arms, legs, or feet. Usually, it is not painful. In most cases, the feeling goes away in a short time and is not a sign of a serious problem. If you have paresthesia that lasts a long time, you may need to be seen by your doctor. Follow these instructions at home: Alcohol use   Do not drink alcohol if: ? Your doctor tells you not to drink. ? You are pregnant, may be pregnant, or are planning to become pregnant.  If you drink alcohol: ? Limit how much you use to:  0-1 drink a day for women.  0-2 drinks a day for men. ? Be aware of how much alcohol is in your drink. In the [place] one drink equals one 12 oz bottle of beer (355 mL), one 5 oz glass of wine (148 mL), or one 1 oz glass of hard liquor (44 mL). Nutrition   Eat a healthy diet. This includes: ? Eating foods that have a lot of fiber in them, such as fresh fruits and vegetables, whole grains, and beans. ? Limiting foods that have a lot of fat and processed sugars in them, such as fried or sweet foods. General instructions  Take over-the-counter and prescription medicines only as told by your doctor.  Do not use any products that have nicotine or tobacco in them, such as cigarettes and e-cigarettes. If you need help quitting, ask your doctor.  If you have diabetes, work with your doctor to make sure your blood sugar stays in a healthy range.  If your feet feel numb: ? Check for redness, warmth, and swelling every day. ? Wear padded socks and comfortable shoes. These help protect your feet.  Keep all follow-up visits as told by your doctor. This is important. Contact a doctor if:  You have paresthesia that gets worse or does not go away.  Your burning or prickling feeling gets worse when you walk.  You have pain or cramps.  You feel dizzy.  You have a rash. Get help right away if  you:  Feel weak.  Have trouble walking or moving.  Have problems speaking, understanding, or seeing.  Feel confused.  Cannot control when you pee (urinate) or poop (have a bowel movement).  Lose feeling (have numbness) after an injury.  Have new weakness in an arm or leg.  Pass out (faint). Summary  Paresthesia is a burning or prickling feeling. It often happens in the hands, arms, legs, or feet.  In most cases, the feeling goes away in a short time and is not a sign of a serious problem.  If you have paresthesia that lasts a long time, you may need to be seen by your doctor. This information is not intended to replace advice given to you by your health care provider. Make sure you discuss any questions you have with your health care provider. Document Released: 05/28/2008 Document Revised: 07/11/2018 Document Reviewed: 06/24/2017 Elsevier Patient Education  2020 [employer]  

## 2019-03-09 NOTE — Progress Notes (Signed)
Established Patient Office Visit  Subjective:  Patient ID: Donald Jenkins, male    DOB: 1971-03-28  Age: 48 y.o. MRN: [id number]  CC:  Chief Complaint  Patient presents with  . Back Pain    HPI Donald Jenkins, 48 year old male who was last seen in the office on 01/18/2019, who presents secondary to complaint of a fall approximately 3 weeks ago at which time he hit his mid to lower back on some concrete stairs after slipping as he was going down the stairs.  He reports that since this time he has had increased pain in his left mid to lower back as well as increased low back pain.  Patient is status post hospitalization earlier this year due to rhabdomyolysis and was found to have a T8 compression fracture for which he had angioplasty done by interventional radiology during hospitalization.  Patient states that his mid back pain from the prior compression fracture had been improving until he had his recent fall.  Patient states that he works in the [employer] at [employer] which involves a lot of lifting and carrying of heavy boxes and packages.  He would like to be referred to someone regarding his current back pain.  Patient states that his pain ranges between a 6-8 on a 0-to-10 scale and ranges between dull and aching to sharp and throbbing.  He denies any urinary symptoms such as frequency, urgency or dysuria.  He has seen no blood in the urine.        He reports that he also continues to have issues with his memory.  He does not believe that he hit his head during his recent fall but now he cannot remember any events for the 5 days immediately after his fall.  He does not really recall events preceding his hospitalization for rhabdomyolysis and T8 fracture.  Patient states that he had been working as usual and had been having some mid back pain but this is happened in the past because of the nature of his job.  He does not recall being found on the floor of his home on the day that he was  admitted to the hospital.  Patient believes that he may have fallen off of the couch while sleeping.  He reports that his sleep is improved after receiving medication that was prescribed by a provider at this clinic at a recent visit and that from the standpoint of fatigue due to lack of sleep he does feel better.  He still feels that his memory is impaired and that he has difficulty retaining new information.  He does not recall being contacted regarding neurology referral for follow-up of his memory issues after his last visit.  Patient has also noticed sensation of numbness in his lower lip that has been present for a few weeks.  He is not sure if this started before or after his recent fall.        He is taking his blood pressure medication, amlodipine which was refilled in July.  He believes that his blood pressure is controlled and he does not think that he is having any headaches or dizziness related to his blood pressure.  He reports that he is following up with his psychiatrist he is not sure if he has had any recent changes in medication.  He denies any suicidal thoughts or ideations but continues to feel anxious about the fact that he cannot remember/recall recent events.   Past Medical History:  Diagnosis Date  .  Anxiety   . Hypertension   . Tachycardia     Past Surgical History:  Procedure Laterality Date  . CHOLECYSTECTOMY N/A 06/05/2018   Procedure: LAPAROSCOPIC CHOLECYSTECTOMY WITH POSSIBLE INTRAOPERATIVE CHOLANGIOGRAM;  Surgeon: Clovis Riley, MD;  Location: [facility] OR;  Service: General;  Laterality: N/A;  . IR VERTEBROPLASTY CERV/THOR BX INC UNI/BIL INC/INJECT/IMAGING  12/13/2018  . KNEE SURGERY      Family History  Problem Relation Age of Onset  . Lung cancer Father   . Esophageal cancer Father   . Brain cancer Father     Social History   Socioeconomic History  . Marital status: Single    Spouse name: Not on file  . Number of children: 0  . Years of education: Not on  file  . Highest education level: Not on file  Occupational History  . Occupation: Arts administrator: [employer]  . Financial resource strain: Not hard at all  . Food insecurity    Worry: Never true    Inability: Never true  . Transportation needs    Medical: No    Non-medical: No  Tobacco Use  . Smoking status: Former Research scientist (life sciences)  . Smokeless tobacco: Never Used  Substance and Sexual Activity  . Alcohol use: Yes    Alcohol/week: 0.0 standard drinks    Comment: socially  . Drug use: Yes    Types: Marijuana  . Sexual activity: Yes    Partners: Female    Birth control/protection: None  Lifestyle  . Physical activity    Days per week: 3 days    Minutes per session: 40 min  . Stress: To some extent  Relationships  . Social Herbalist on phone: Once a week    Gets together: Once a week    Attends religious service: Never    Active member of club or organization: No    Attends meetings of clubs or organizations: Never    Relationship status: Never married  . Intimate partner violence    Fear of current or ex partner: No    Emotionally abused: No    Physically abused: No    Forced sexual activity: No  Other Topics Concern  . 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 ALPRAZolam (XANAX) 1 MG tablet Take 1 tablet (1 mg total) by mouth 3 (three) times daily as needed for anxiety. 270 tablet 1  . amLODipine (NORVASC) 5 MG tablet Take 1 tablet (5 mg total) by mouth daily. To help lower blood pressure 30 tablet 3  . montelukast (SINGULAIR) 10 MG tablet Take 10 mg by mouth daily.    . QUEtiapine (SEROQUEL) 25 MG tablet Take 1 tablet (25 mg total) by mouth at bedtime. 30 tablet 0  . zaleplon (SONATA) 10 MG capsule Take 10 mg by mouth at bedtime as needed for sleep.     Marland Kitchen levocetirizine (XYZAL) 5 MG tablet Take 1 tablet (5 mg  total) by mouth every evening. To help with nasal allergies (Patient not taking: Reported on 03/02/2019) 30 tablet 5   No facility-administered medications prior to visit.     Allergies  Allergen Reactions  . Penicillins Other (See Comments)    Did it involve swelling of the face/tongue/throat, SOB, or low BP? Unknown Did it involve sudden or severe rash/hives, skin peeling, or  any reaction on the inside of your mouth or nose? Unknown Did you need to seek medical attention at a hospital or doctor's office? Unknown When did it last happen?childhood If all above answers are "NO", may proceed with cephalosporin use.     ROS Review of Systems  Constitutional: Positive for fatigue. Negative for chills and fever.  HENT: Negative for sore throat and trouble swallowing.   Respiratory: Negative for cough and shortness of breath.   Cardiovascular: Negative for chest pain and palpitations.  Gastrointestinal: Negative for abdominal pain, blood in stool, constipation, diarrhea and nausea.  Endocrine: Negative for cold intolerance, heat intolerance, polydipsia, polyphagia and polyuria.  Genitourinary: Positive for flank pain (Pain in left lateral mid to lower back status post fall). Negative for dysuria.  Musculoskeletal: Positive for back pain. Negative for arthralgias.  Neurological: Positive for numbness (Recent onset of sensation of numbness in his lower lip). Negative for dizziness and headaches.       Difficulty with memory  Hematological: Negative for adenopathy. Does not bruise/bleed easily.  Psychiatric/Behavioral: Positive for sleep disturbance (Improved with prescribed medication). Negative for self-injury and suicidal ideas. The patient is nervous/anxious.       Objective:    Physical Exam  Constitutional: He is oriented to person, place, and time. He appears well-developed and well-nourished.  Neck: Normal range of motion. Neck supple. No JVD present. No thyromegaly present.    Cardiovascular: Normal rate.  Pulmonary/Chest: Effort normal.  Abdominal: Soft. There is no abdominal tenderness. There is no rebound and no guarding.  Musculoskeletal: Normal range of motion.        General: Tenderness present. No edema.     Comments: Patient with some left-sided low back pain below the CVA angle and bilateral thoracolumbar paraspinous spasm left greater than right.  Minimal discomfort over lumbosacral spine as well as mid to lower thoracic spine.  No CVA tenderness.  Lymphadenopathy:    He has no cervical adenopathy.  Neurological: He is alert and oriented to person, place, and time.  Skin: Skin is warm and dry.  Psychiatric:  Patient with slightly flattened affect; also patient did not recall that he saw me a recent visit  Nursing note and vitals reviewed.   BP 123/75   Pulse 68   Temp 98.2 F (36.8 C) (Oral)   Ht 6' (1.829 m)   Wt 186 lb 12.8 oz (84.7 kg)   SpO2 99%   BMI 25.33 kg/m  Wt Readings from Last 3 Encounters:  03/09/19 186 lb 12.8 oz (84.7 kg)  12/15/18 173 lb 12.8 oz (78.8 kg)  06/05/18 177 lb 11.1 oz (80.6 kg)     Health Maintenance Due  Topic Date Due  . TETANUS/TDAP  11/08/1990  . INFLUENZA VACCINE  01/28/2019    Lab Results  Component Value Date   TSH 1.538 12/11/2018   Lab Results  Component Value Date   WBC 11.8 (H) 03/02/2019   HGB 14.4 03/02/2019   HCT 40.9 03/02/2019   MCV 85.0 03/02/2019   PLT 308 03/02/2019   Lab Results  Component Value Date   NA 140 03/02/2019   K 3.0 (L) 03/02/2019   CO2 26 03/02/2019   GLUCOSE 105 (H) 03/02/2019   BUN 21 (H) 03/02/2019   CREATININE 0.67 03/02/2019   BILITOT 1.2 03/02/2019   ALKPHOS 85 03/02/2019   AST 69 (H) 03/02/2019   ALT 34 03/02/2019   PROT 8.3 (H) 03/02/2019   ALBUMIN 4.6 03/02/2019   CALCIUM 9.1 03/02/2019  ANIONGAP 17 (H) 03/02/2019   Lab Results  Component Value Date   CHOL 142 12/08/2018   Lab Results  Component Value Date   HDL 56 12/08/2018   Lab  Results  Component Value Date   LDLCALC 64 12/08/2018   Lab Results  Component Value Date   TRIG 112 12/08/2018   Lab Results  Component Value Date   CHOLHDL 2.5 12/08/2018   Lab Results  Component Value Date   HGBA1C 4.8 12/08/2018      Assessment & Plan:  1.  Compression fracture of T8 vertebra with routine healing, subsequent encounter Patient is status post compression fracture of T8 and he reports that he felt that this had been healing until he had recent fall.  Patient is not sure why he had the initial T8 compression fracture.  He is status post vertebroplasty by interventional radiology.  He will be referred to orthopedics for further evaluation of his current knee back pain status post fall as well as for imaging of compression fracture area to make sure that no new injury has occurred status post recent fall.  Patient will have vitamin D level to look for vitamin D deficiency that may have contributed to his compression fracture. - AMB referral to orthopedics - Vitamin D, 25-hydroxy  2. Essential hypertension, benign Blood pressure is improved at today's visit and he will continue the use of amlodipine 5 mg daily.  Low-sodium diet and adequate rest are also encouraged  3. Memory loss Patient will again be referred to neurology in follow-up of his issues with memory loss.  He is additionally encouraged to continue to follow-up with his mental health provider/psychiatrist as he has a history of anxiety and depression and reports that his issues with memory loss are causing him to have increased anxiety.  Patient will have TSH, vitamin B12, BMP, RPR and HIV testing to look for possible causes of memory loss. - Ambulatory referral to Neurology - RPR - HIV antibody (with reflex)  4. Hypokalemia Patient has had hypokalemia on recent blood work and will have repeat BMP at today's visit.  He will be notified if any interventions are needed based on BMP results - Basic Metabolic  Panel  5. Paresthesia Patient with complaint of numbness in his lower lip.  Patient denies any hyperventilation.  Patient does not believe that the numbness in his lip is associated with having increased anxiety/panic attacks.  Patient will have blood work including BMP to check electrolyte status, vitamin B12 level to look for B12 deficiency and TSH to look for thyroid disorder as possible causes of his paresthesia/lip numbness. - Vitamin B12 - TSH  6. Screening for HIV (human immunodeficiency virus) Preventative health measures discussed patient agrees to have HIV screening.  HIV level is also being done secondary to patient's complaint of issues with memory. - HIV antibody (with reflex)   An After Visit Summary was printed and given to the patient.  Follow-up: Return in about 6 weeks (around 04/20/2019) for chronic issues.   Antony Blackbird, MD

## 2019-03-10 ENCOUNTER — Other Ambulatory Visit: Payer: Self-pay | Admitting: Registered Nurse

## 2019-03-10 DIAGNOSIS — I10 Essential (primary) hypertension: Secondary | ICD-10-CM

## 2019-03-10 LAB — BASIC METABOLIC PANEL WITH GFR
BUN/Creatinine Ratio: 12 (ref 9–20)
BUN: 8 mg/dL (ref 6–24)
CO2: 26 mmol/L (ref 20–29)
Calcium: 10 mg/dL (ref 8.7–10.2)
Chloride: 100 mmol/L (ref 96–106)
Creatinine, Ser: 0.67 mg/dL — ABNORMAL LOW (ref 0.76–1.27)
GFR calc Af Amer: 132 mL/min/1.73
GFR calc non Af Amer: 114 mL/min/1.73
Glucose: 84 mg/dL (ref 65–99)
Potassium: 4.4 mmol/L (ref 3.5–5.2)
Sodium: 140 mmol/L (ref 134–144)

## 2019-03-10 LAB — TSH: TSH: 1.47 u[IU]/mL (ref 0.450–4.500)

## 2019-03-10 LAB — HIV ANTIBODY (ROUTINE TESTING W REFLEX): HIV Screen 4th Generation wRfx: NONREACTIVE

## 2019-03-10 LAB — SYPHILIS: RPR W/REFLEX TO RPR TITER AND TREPONEMAL ANTIBODIES, TRADITIONAL SCREENING AND DIAGNOSIS ALGORITHM: RPR Ser Ql: NONREACTIVE

## 2019-03-10 LAB — VITAMIN D 25 HYDROXY (VIT D DEFICIENCY, FRACTURES): Vit D, 25-Hydroxy: 36.8 ng/mL (ref 30.0–100.0)

## 2019-03-10 LAB — VITAMIN B12: Vitamin B-12: 462 pg/mL (ref 232–1245)

## 2019-03-10 NOTE — Telephone Encounter (Signed)
Requested medication (s) are due for refill today: no  Requested medication (s) are on the active medication list: no  Last refill: 11/07/2018  Future visit scheduled: no  Notes to clinic:  Review for refill   Requested Prescriptions  Pending Prescriptions Disp Refills   lisinopril-hydrochlorothiazide (ZESTORETIC) 10-12.5 MG tablet [Pharmacy Med Name: LISINOPRIL-HCTZ 10-12.5 MG TAB] 90 tablet 1    Sig: TAKE 1 TABLET BY MOUTH EVERY DAY     Cardiovascular:  ACEI + Diuretic Combos Failed - 03/10/2019 10:09 AM      Failed - Cr in normal range and within 180 days    Creat  Date Value Ref Range Status  01/14/2016 1.05 0.60 - 1.35 mg/dL Final   Creatinine, Ser  Date Value Ref Range Status  03/09/2019 0.67 (L) 0.76 - 1.27 mg/dL Final         Passed - Na in normal range and within 180 days    Sodium  Date Value Ref Range Status  03/09/2019 140 134 - 144 mmol/L Final         Passed - K in normal range and within 180 days    Potassium  Date Value Ref Range Status  03/09/2019 4.4 3.5 - 5.2 mmol/L Final         Passed - Ca in normal range and within 180 days    Calcium  Date Value Ref Range Status  03/09/2019 10.0 8.7 - 10.2 mg/dL Final         Passed - Patient is not pregnant      Passed - Last BP in normal range    BP Readings from Last 1 Encounters:  03/09/19 123/75         Passed - Valid encounter within last 6 months    Recent Outpatient Visits          4 months ago Encounter to establish care   [facility], Delfino Lovett, NP   1 year ago Essential hypertension, benign   Primary Care at [facility], [place], [place]   3 years ago Essential hypertension   Primary Care at [facility], [place], [place]   3 years ago Insect bites   Primary Care at [facility], Gay Filler, MD   3 years ago Essential hypertension, benign   [facility] at [facility], Janalee Dane, MD

## 2019-03-13 ENCOUNTER — Other Ambulatory Visit: Payer: Self-pay

## 2019-03-13 ENCOUNTER — Encounter: Payer: Self-pay | Admitting: Psychiatry

## 2019-03-13 ENCOUNTER — Ambulatory Visit (INDEPENDENT_AMBULATORY_CARE_PROVIDER_SITE_OTHER): Payer: BC Managed Care – PPO | Admitting: Psychiatry

## 2019-03-13 VITALS — Ht 72.0 in | Wt 191.0 lb

## 2019-03-13 DIAGNOSIS — F3342 Major depressive disorder, recurrent, in full remission: Secondary | ICD-10-CM | POA: Diagnosis not present

## 2019-03-13 DIAGNOSIS — F4010 Social phobia, unspecified: Secondary | ICD-10-CM | POA: Diagnosis not present

## 2019-03-13 DIAGNOSIS — F441 Dissociative fugue: Secondary | ICD-10-CM

## 2019-03-13 DIAGNOSIS — F410 Panic disorder [episodic paroxysmal anxiety] without agoraphobia: Secondary | ICD-10-CM | POA: Diagnosis not present

## 2019-03-13 DIAGNOSIS — G4710 Hypersomnia, unspecified: Secondary | ICD-10-CM

## 2019-03-13 MED ORDER — QUETIAPINE FUMARATE 50 MG PO TABS: 50.0000 mg | ORAL_TABLET | Freq: Every day | ORAL | 0 refills | Status: DC

## 2019-03-13 NOTE — Progress Notes (Signed)
[facility]  Patient ID: Donald Jenkins,  MRN: [id number]  PCP: Patient, No Pcp Per  Date of Evaluation: 03/13/2019 Time spent:30 minutes from 1020 to 1050  Chief Complaint:  Chief Complaint    Memory Loss; Anxiety; Depression; Panic Attack      HISTORY/CURRENT STATUS: Donald Jenkins is seen onsite in office face-to-face individually with consent with epic collateral for psychiatric interview and exam in 6-day evaluation and management of amnestic and indirectly manifest fugue suggested to last 5 days from 02/25/2019 stating his memory is modestly returning from last session but there are major gaps that are causing him significant anger which he denies creating the mechanism for dissociation.  He speaks very irritably about missing work, being places with people about which he has no memory, and envisions 5-days when he was possibly continuously sleeping without clarifying nutrition, excretory function, or other responsibility.  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  He has the previous vulnerability of recent rhabdomyolysis and now hypokalemia treated in the ED with directive to be followed up by Dr. Chapman Fitch from urgent care when at last appointment he seemed not to recognize his PCP Dr. Chapman Fitch.  He did see Dr. Chapman Fitch in the interim 03/09/2019 with laboratory testing potassium normal at 4.4 screening for other potential contributing factors to encephalopathy or delirium scheduling neurology appointment in October earliest available outpatient.  He had initially minimized weight loss last appointment but has regained 8 pounds in 6 days stating that mother has been cooking for him but will be returning to her home today, especially as she does not appreciate psychiatrists and emotional problems.  He is more realistic and directive in preparing for return to work today whereas last  appointment he simply seemed to expect to be sent immediately to the workplace without such preparation.  He gradually clarifies that he could have been angry with brother in an extended way having to leave early from the last visit to mother's home brother having a delinquent style in significant contrast and conflict with the patient.  He could be angry over girlfriend relations when he went for years with social anxiety and no friends or social life of significance except with old women now the having an intense relationship with girlfriend figure who had Aspergers not working out and a new girlfriend he has not otherwise described as though apprehensive talking about her may result in loss of another girlfriend.  He has been taking his Seroquel 25 mg nightly as his only medication besides his potassium and amlodipine for blood pressure, and he agrees today for the need to increase dose of Seroquel for anxiety, depression, and insomnia. He has not been taking his Sonata or Xanax.  He has not been using cannabis.  He dissipates anger repeatedly through the session as though attempting to overcome his denial but does not do so successfully.  He repeatedly states he is starting to remember again and is doing better but that he is stressed by what he cannot remember.  Differential medically is at least partially resolved thus far by Dr. Chapman Fitch as psychiatric diagnoses become more clear at least in dynamics.  He is not psychotic, manic, suicidal, or intoxicated.  Depression This is a recurrentepisodic problem which had been significantly relieved by intervention for his social anxiety over much of a year now with modest recurrence most painfully complicated by his amnestic fugue. Episodic  panic is less but now he feels in trouble at work with his recent absences from they were unable to reach him.The curent episode started 1 month ago. The onset quality is sudden. The problem occurs intermittently.The  problem had resolvedsince onset before recurrence.  The ED questions whether he was not depressed again as the mechanism of his memory disturbance.Associated symptoms include episodic pulsating headaches,  irritable repressed anger, denial, decreased concentration,fatigue, amnesia,and insomnia. Associated symptoms include not irritable,no decreased interest,no body aches,no,not unreasonably sadfor the circumstance, and no suicidal ideas.The symptoms are aggravated by work stress and social issues.Past treatments include SSRIs - Selective serotonin reuptake inhibitors and other medications.  Individual Medical History/ Review of Systems: Changes? :Yes From appointment with Dr. Antony Blackbird on 03/09/1999, his blood pressure treatment is updated and he is medically reassured though yet to be fully discussed for expected restoration of full function.  Ref Range & Units 5d ago (03/09/19) 12d ago (03/02/19) 68mo ago (01/05/19) 86mo ago (12/15/18)  Glucose 65 - 99 mg/dL 84  105High  R  86  86 R   BUN 6 - 24 mg/dL 8  21High  R  8  10 R   Creatinine, Ser 0.76 - 1.27 mg/dL 0.67Low   0.67 R  0.70Low   0.78 R   GFR calc non Af Amer >59 mL/min/1.73 114  >60 R  112  >60 R   GFR calc Af Amer >59 mL/min/1.73 132  >60 R  130  >60 R   BUN/Creatinine Ratio 9 - 20 12   11     Sodium 134 - 144 mmol/L 140  140 R  139  139 R   Potassium 3.5 - 5.2 mmol/L 4.4  3.0Low  R  4.4  3.9 R   Chloride 96 - 106 mmol/L 100  97Low  R  97  103 R   CO2 20 - 29 mmol/L 26  26 R  23  29 R   Calcium 8.7 - 10.2 mg/dL 10.0  9.1 R  9.9  9.2 R   Resulting Agency  [facility]    Narrative Performed by: Maryan Puls Performed at: [facility]  [address] [place], [place] [id number]  Lab Director: Rush Farmer MD, Phone: [phone]    Specimen Collected: 03/09/19 11:28 Last Resulted: 03/10/19 05:37          Vitamin D, 25 hydroxy Collected: 03/09/19 1128  Result status: Final    Resulting lab: [facility]  Reference range: 30.0 - 100.0 ng/mL  Value: 36.8   Vitamin B12 Vitamin B12 Collected: 03/09/19 1128  Result status: Final  Resulting lab: [facility]  Reference range: 232 - 1,245 pg/mL  Value: 462   TSH TSH Collected: 03/09/19 1128  Result status: Final  Resulting lab: [facility]  Reference range: 0.450 - 4.500 uIU/mL  Value: 1.470    Allergies: Penicillins  Current Medications:  Current Outpatient Medications:    ALPRAZolam (XANAX) 1 MG tablet, Take 1 tablet (1 mg total) by mouth 3 (three) times daily as needed for anxiety., Disp: 270 tablet, Rfl: 1   amLODipine (NORVASC) 5 MG tablet, Take 1 tablet (5 mg total) by mouth daily. To help lower blood pressure, Disp: 30 tablet, Rfl: 3   levocetirizine (XYZAL) 5 MG tablet, Take 1 tablet (5 mg total) by mouth every evening. To help with nasal allergies (Patient not taking: Reported on 03/02/2019), Disp: 30 tablet, Rfl: 5   montelukast (SINGULAIR) 10 MG tablet, Take 10 mg  by mouth daily., Disp: , Rfl:    QUEtiapine (SEROQUEL) 50 MG tablet, Take 1 tablet (50 mg total) by mouth at bedtime., Disp: 30 tablet, Rfl: 0   zaleplon (SONATA) 10 MG capsule, Take 10 mg by mouth at bedtime as needed for sleep. , Disp: , Rfl:    Medication Side Effects: none  Family Medical/ Social History: Changes? Yes he is gradually clarifying that current family dynamics and relations with girlfriend's are much more likely than any employment conflict to establish the nidus for dissociation.  MENTAL HEALTH EXAM:  Height 6' (1.829 m), weight 191 lb (86.6 kg).Body mass index is 25.9 kg/m.  Others deferred for coronavirus but down allowing PCP visit except Muscle strengths and tone 5/5, postural reflexes and gait 0/0, and AIMS = 0.  General Appearance: Casual, Fairly Groomed, Guarded and Meticulous  Eye Contact:  Good  Speech:  Clear and Coherent, Normal Rate, Pressured and Talkative with angry irritability separating on denial of his  anger as he dissipates toward satiation  Volume:  Normal  Mood:  Angry, Anxious, Depressed, Dysphoric, Irritable and Worthless  Affect:  Congruent, Depressed, Inappropriate, Labile, Full Range and Anxious  Thought Process:  Coherent, Goal Directed, Irrelevant and Descriptions of Associations: Circumstantial  Orientation:  Full (Time, Place, and Person)  Thought Content: Illogical, Ilusions, Obsessions, Paranoid Ideation and Rumination   Suicidal Thoughts:  No  Homicidal Thoughts:  No  Memory:  Immediate;   Fair Remote;   Good  Judgement:  Fair  Insight:  Fair and Lacking  Psychomotor Activity:  Normal, Increased, Mannerisms and Restlessness  Concentration:  Concentration: Fair and Attention Span: Good  Recall:  Fair but still poor for his defined time of amnesia but partially returned  [employer] of Knowledge: Good  Language: Good  Assets:  Desire for Improvement Resilience Talents/Skills Vocational/Educational  ADL's:  Intact  Cognition: WNL for attentional function though applying himself and and irritable pattern of denial explaining dissociation more directly  Prognosis:  Good    DIAGNOSES:    ICD-10-CM   1. Dissociative amnesia with dissociative fugue ([place])  F44.1 QUEtiapine (SEROQUEL) 50 MG tablet  2. Social anxiety disorder  F40.10 QUEtiapine (SEROQUEL) 50 MG tablet  3. Panic disorder  F41.0 QUEtiapine (SEROQUEL) 50 MG tablet  4. Recurrent major depression in full remission ([place])  F33.42   5. Hypersomnolence disorder, with mental disorder, acute, moderate  G47.10    F99     Receiving Psychotherapy: No but we process option for such   RECOMMENDATIONS: Fully 20 minutes of the time is spent in face to face counseling and coordination of care attempting undoing of denial of anger to dissipate in reworking anger allowance for reestablishment of psychological restoration to return to work and then social life.  Cognitive behavioral nutrition, sleep hygiene, social skills,  frustration management can follow as satiation of his denial of anger can be achieved by end of the session.  He reworks with letter to HR the plan for return to work 03/20/2019 with excuse medically for absence from 02/26/2019.  Safe sufficiency with mother's departure this afternoon again to be alone at home as he prepares follow-up with Dr. Chapman Fitch and eventually neurology consultation reorganizing his daily life to then return to reorganizing his work life can be aligned and planned.  Symptom treatment matching increases his Seroquel to 50 mg every bedtime doubling his current 25 mg tablets and eScription sent as #30 with no refill [facility] for delirium symptoms in the setting of  recent rhabdomyolysis and hypokalemia as he more fully manifests today the dissociative amnesia and fugue.  He returns in 2 weeks for follow-up or sooner if needed and is given 2 copies of the letter to his HR for medical excuse and release to return to work 03/20/2019.   Delight Hoh, MD

## 2019-03-17 ENCOUNTER — Telehealth: Payer: Self-pay | Admitting: *Deleted

## 2019-03-17 ENCOUNTER — Other Ambulatory Visit: Payer: Self-pay | Admitting: Family Medicine

## 2019-03-17 DIAGNOSIS — M545 Low back pain, unspecified: Secondary | ICD-10-CM

## 2019-03-17 DIAGNOSIS — S22060D Wedge compression fracture of T7-T8 vertebra, subsequent encounter for fracture with routine healing: Secondary | ICD-10-CM

## 2019-03-17 NOTE — Telephone Encounter (Signed)
New Ortho referral has been placed

## 2019-03-17 NOTE — Telephone Encounter (Signed)
I can place the referral again but per note from Orthopedics he refused initial appointment with them

## 2019-03-17 NOTE — Telephone Encounter (Signed)
Patient states he is getting the run around from Dr. Arlean Hopping office. He is request a referral be made to the Orthopedic Specialist you referred him to initially.   Please advise.

## 2019-03-17 NOTE — Progress Notes (Signed)
Patient ID: Donald Jenkins, male   DOB: July 01, 1970, 48 y.o.   MRN: [id number]   Patient left message that he needs new referral to Orthopedics about his back pain as he is unable to get an appointment with IR-interventional radiologist who did his prior kyphoplasty

## 2019-03-20 ENCOUNTER — Emergency Department (HOSPITAL_COMMUNITY): Payer: BC Managed Care – PPO

## 2019-03-20 ENCOUNTER — Encounter (HOSPITAL_COMMUNITY): Payer: Self-pay

## 2019-03-20 ENCOUNTER — Other Ambulatory Visit: Payer: Self-pay

## 2019-03-20 ENCOUNTER — Emergency Department (HOSPITAL_COMMUNITY)
Admission: EM | Admit: 2019-03-20 | Discharge: 2019-03-20 | Disposition: A | Discharge status: Home / Self Care | Payer: BC Managed Care – PPO | Attending: Emergency Medicine | Admitting: Emergency Medicine

## 2019-03-20 DIAGNOSIS — M5441 Lumbago with sciatica, right side: Secondary | ICD-10-CM | POA: Diagnosis not present

## 2019-03-20 DIAGNOSIS — I10 Essential (primary) hypertension: Secondary | ICD-10-CM | POA: Diagnosis not present

## 2019-03-20 DIAGNOSIS — Z79899 Other long term (current) drug therapy: Secondary | ICD-10-CM | POA: Diagnosis not present

## 2019-03-20 DIAGNOSIS — M5442 Lumbago with sciatica, left side: Secondary | ICD-10-CM | POA: Diagnosis not present

## 2019-03-20 DIAGNOSIS — Z87891 Personal history of nicotine dependence: Secondary | ICD-10-CM | POA: Diagnosis not present

## 2019-03-20 DIAGNOSIS — M545 Low back pain: Secondary | ICD-10-CM | POA: Diagnosis present

## 2019-03-20 MED ORDER — LIDOCAINE 5 % EX PTCH
1.0000 | MEDICATED_PATCH | CUTANEOUS | Status: DC
  Administered 2019-03-20: 1 via TRANSDERMAL
  Filled 2019-03-20: qty 1

## 2019-03-20 MED ORDER — NAPROXEN 500 MG PO TABS: 500.0000 mg | ORAL_TABLET | Freq: Two times a day (BID) | ORAL | 0 refills | Status: DC

## 2019-03-20 MED ORDER — METHOCARBAMOL 1000 MG/10ML IJ SOLN
500.0000 mg | Freq: Once | INTRAVENOUS | Status: AC
  Administered 2019-03-20: 500 mg via INTRAVENOUS
  Filled 2019-03-20: qty 5

## 2019-03-20 MED ORDER — KETOROLAC TROMETHAMINE 30 MG/ML IJ SOLN
30.0000 mg | Freq: Once | INTRAMUSCULAR | Status: AC
  Administered 2019-03-20: 30 mg via INTRAVENOUS
  Filled 2019-03-20: qty 1

## 2019-03-20 MED ORDER — ONDANSETRON HCL 4 MG/2ML IJ SOLN
4.0000 mg | Freq: Once | INTRAMUSCULAR | Status: AC
  Administered 2019-03-20: 4 mg via INTRAVENOUS
  Filled 2019-03-20: qty 2

## 2019-03-20 MED ORDER — PREDNISONE 20 MG PO TABS
60.0000 mg | ORAL_TABLET | Freq: Once | ORAL | Status: AC
  Administered 2019-03-20: 60 mg via ORAL
  Filled 2019-03-20: qty 3

## 2019-03-20 MED ORDER — METHOCARBAMOL 500 MG PO TABS: 500.0000 mg | ORAL_TABLET | Freq: Two times a day (BID) | ORAL | 0 refills | Status: DC

## 2019-03-20 MED ORDER — PREDNISONE 20 MG PO TABS: 60.0000 mg | ORAL_TABLET | Freq: Every day | ORAL | 0 refills | Status: AC

## 2019-03-20 MED ORDER — HYDROCODONE-ACETAMINOPHEN 5-325 MG PO TABS: 1.0000 | ORAL_TABLET | Freq: Four times a day (QID) | ORAL | 0 refills | Status: DC | PRN

## 2019-03-20 MED ORDER — MORPHINE SULFATE (PF) 4 MG/ML IV SOLN
4.0000 mg | Freq: Once | INTRAVENOUS | Status: AC
  Administered 2019-03-20: 4 mg via INTRAVENOUS
  Filled 2019-03-20: qty 1

## 2019-03-20 NOTE — Discharge Instructions (Addendum)
You were seen here today for Back Pain: Low back pain is discomfort in the lower back that may be due to injuries to muscles and ligaments around the spine. Occasionally, it may be caused by a problem to a part of the spine called a disc. Your back pain should be treated with medicines listed below as well as back exercises and this back pain should get better over the next 2 weeks. Most patients get completely well in 4 weeks. It is important to know however, if you develop severe or worsening pain, low back pain with fever, numbness, weakness or inability to walk or urinate, you should return to the ER immediately.  Please follow up with your doctor this week for a recheck if still having symptoms.  HOME INSTRUCTIONS Self - care:  The application of heat can help soothe the pain.  Maintaining your daily activities, including walking (this is encouraged), as it will help you get better faster than just staying in bed. Do not life, push, pull anything more than 10 pounds for the next week. I am attaching back exercises that you can do at home to help facilitate your recovery.   Back Exercises - I have attached a handout on back exercises that can be done at home to help facilitate your recovery.   Medications are also useful to help with pain control.   Acetaminophen.  This medication is generally safe, and found over the counter. Take as directed for your age. You should not take more than 8 of the extra strength (500mg ) pills a day (max dose is 4000mg  total OVER one day)  Lidocaine patches: You can use over-the-counter salon pas lidocaine patches, these last for 12 hours and provide localized relief.  Non steroidal anti inflammatory: This includes medications including Ibuprofen, naproxen and Mobic; These medications help both pain and swelling and are very useful in treating back pain.  They should be taken with food, as they can cause stomach upset, and more seriously, stomach bleeding. Do not  combine the medications.   Muscle relaxants:  These medications can help with muscle tightness that is a cause of lower back pain.  Most of these medications can cause drowsiness, and it is not safe to drive or use dangerous machinery while taking them. They are primarily helpful when taken at night before sleep.  Prednisone - This is an oral steroid.  This medication is best taken with food in the morning.  Please note that this medication can cause anxiety, mood swings, muscle fatigue, increased hunger, weight gain (sodium/fluid retention), poor sleep as well as other symptoms. If you are a diabetic, please monitor your blood sugars at home as this medication can increase your blood sugars. Call your pharmacist if you have any questions.  You will need to follow up with your primary healthcare provider or the Orthopedist in 1-2 weeks for reassessment and persistent symptoms.  Be aware that if you develop new symptoms, such as a fever, leg weakness, difficulty with or loss of control of your urine or bowels, abdominal pain, or more severe pain, you will need to seek medical attention and/or return to the Emergency department. Additional Information:  Your vital signs today were: BP 128/86    Pulse 80    Temp 97.7 F (36.5 C) (Oral)    Resp 20    SpO2 98%  If your blood pressure (BP) was elevated above 135/85 this visit, please have this repeated by your doctor within one month. ---------------

## 2019-03-20 NOTE — ED Provider Notes (Signed)
[facility] EMERGENCY DEPARTMENT Provider Note   CSN: [id number] Arrival date & time: 03/20/19  [id number]     History   Chief Complaint Chief Complaint  Patient presents with  . Back Pain    HPI Donald Jenkins is a 48 y.o. male.     Donald Jenkins is a 48 y.o. male with a history of hypertension, tachycardia, dissociative amnesia, panic disorder, depression, recent thoracic spine injury with vertebroplasty, who presents to the emergency department for evaluation of low back pain.  He reports that for the past 2 to 3 days he has had a pain that starts in the middle of his low back just above the sacrum and radiates into bilateral hips.  Patient reports pain was significantly worse when he woke up this morning he got up and tried to go to work, got to work for about 15 minutes but due to increasing pain with movement was unable to do much and so left work and drove himself to the hospital.  He denies numbness or weakness just reports that his movement worsens pain and therefore limits his mobility.  He has not had any loss of bowel or bladder control, no saddle anesthesia.  He denies any associated urinary symptoms, no abdominal pain, nausea, vomiting or constipation.  No fevers or chills, no history of IV drug use or personal history of cancer.  He does report that in June he was diagnosed with a T8 compression fracture and had a vertebroplasty done with Dr. Estanislado Pandy, reports that about 3 weeks ago he slipped and fell while going down his stairs landing on his thoracic back, but this is not the location of his pain today.  He has not been able to follow-up with Dr. Estanislado Pandy since the fall.  Took ibuprofen, but has not tried anything else to treat pain prior to arrival.     Past Medical History:  Diagnosis Date  . Anxiety   . Hypertension   . Tachycardia     Patient Active Problem List   Diagnosis Date Noted  . Dissociative amnesia with dissociative fugue ([place])  03/07/2019  . Acute midline thoracic back pain 01/18/2019  . Hypomagnesemia   . Fall   . Hypoglycemia 12/09/2018  . Bacteremia 12/09/2018  . Acute metabolic encephalopathy [id number]  . Metabolic acidosis [id number]  . Compression fracture of body of thoracic vertebra ([place]) 12/08/2018  . Rhabdomyolysis 12/07/2018  . SIRS (systemic inflammatory response syndrome) ([place]) 12/07/2018  . Back pain 12/07/2018  . Depression with anxiety 12/07/2018  . Abnormal LFTs 12/07/2018  . AKI (acute kidney injury) ([place]) 12/07/2018  . Elevated troponin 12/07/2018  . Hypersomnolence disorder 11/01/2018  . Cholecystitis 06/05/2018  . Recurrent major depression in full remission ([place]) 04/05/2018  . Social anxiety disorder 03/18/2018  . Panic disorder 03/18/2018  . Essential hypertension, benign 06/06/2015    Past Surgical History:  Procedure Laterality Date  . CHOLECYSTECTOMY N/A 06/05/2018   Procedure: LAPAROSCOPIC CHOLECYSTECTOMY WITH POSSIBLE INTRAOPERATIVE CHOLANGIOGRAM;  Surgeon: Clovis Riley, MD;  Location: [facility] OR;  Service: General;  Laterality: N/A;  . IR VERTEBROPLASTY CERV/THOR BX INC UNI/BIL INC/INJECT/IMAGING  12/13/2018  . KNEE SURGERY          Home Medications    Prior to Admission medications   Medication Sig Start Date End Date Taking? Authorizing Provider  ALPRAZolam Duanne Moron) 1 MG tablet Take 1 tablet (1 mg total) by mouth 3 (three) times daily as needed for anxiety. 11/01/18   Milana Huntsman  E, MD  amLODipine (NORVASC) 5 MG tablet Take 1 tablet (5 mg total) by mouth daily. To help lower blood pressure 01/18/19   Fulp, Cammie, MD  levocetirizine (XYZAL) 5 MG tablet Take 1 tablet (5 mg total) by mouth every evening. To help with nasal allergies Patient not taking: Reported on 03/02/2019 01/18/19   Fulp, Cammie, MD  montelukast (SINGULAIR) 10 MG tablet Take 10 mg by mouth daily. 02/25/19   [provider]  QUEtiapine (SEROQUEL) 50 MG tablet Take 1 tablet (50 mg total) by mouth  at bedtime. 03/13/19   Delight Hoh, MD  zaleplon (SONATA) 10 MG capsule Take 10 mg by mouth at bedtime as needed for sleep.  02/16/19   [provider]  methylphenidate (RITALIN) 5 MG tablet Take 1 tablet (5 mg total) by mouth 3 (three) times daily with meals. Patient not taking: Reported on 12/22/2018 11/16/18 03/03/19  Delight Hoh, MD  mirtazapine (REMERON) 15 MG tablet Take 1 tablet (15 mg total) by mouth at bedtime. Patient not taking: Reported on 03/02/2019 01/18/19 03/03/19  Antony Blackbird, MD    Family History Family History  Problem Relation Age of Onset  . Lung cancer Father   . Esophageal cancer Father   . Brain cancer Father     Social History Social History   Tobacco Use  . Smoking status: Former Research scientist (life sciences)  . Smokeless tobacco: Never Used  Substance Use Topics  . Alcohol use: Yes    Alcohol/week: 0.0 standard drinks    Comment: socially  . Drug use: Yes    Types: Marijuana     Allergies   Penicillins   Review of Systems Review of Systems  Constitutional: Negative for chills and fever.  HENT: Negative.   Respiratory: Negative for shortness of breath.   Cardiovascular: Negative for chest pain.  Gastrointestinal: Negative for abdominal pain, constipation, diarrhea, nausea and vomiting.  Genitourinary: Negative for dysuria, flank pain, frequency and hematuria.  Musculoskeletal: Positive for back pain. Negative for arthralgias, gait problem, joint swelling, myalgias and neck pain.  Skin: Negative for color change, rash and wound.  Neurological: Negative for weakness and numbness.     Physical Exam Updated Vital Signs BP (!) 153/87 (BP Location: Right Arm)   Pulse 95   Temp 97.7 F (36.5 C) (Oral)   Resp 20   SpO2 100%   Physical Exam Vitals signs and nursing note reviewed.  Constitutional:      General: He is not in acute distress.    Appearance: Normal appearance. He is well-developed and normal weight. He is not diaphoretic.     Comments:  Patient appears uncomfortable, but is in no acute distress.  HENT:     Head: Atraumatic.     Mouth/Throat:     Mouth: Mucous membranes are moist.     Pharynx: Oropharynx is clear.  Eyes:     General:        Right eye: No discharge.        Left eye: No discharge.  Neck:     Musculoskeletal: Neck supple.  Cardiovascular:     Rate and Rhythm: Normal rate and regular rhythm.     Pulses:          Radial pulses are 2+ on the right side and 2+ on the left side.       Dorsalis pedis pulses are 2+ on the right side and 2+ on the left side.       Posterior tibial pulses are  2+ on the right side and 2+ on the left side.     Heart sounds: Normal heart sounds. No murmur. No friction rub. No gallop.   Pulmonary:     Effort: Pulmonary effort is normal. No respiratory distress.     Breath sounds: Normal breath sounds.     Comments: Respirations equal and unlabored, patient able to speak in full sentences, lungs clear to auscultation bilaterally Abdominal:     General: Bowel sounds are normal. There is no distension.     Palpations: Abdomen is soft. There is no mass.     Tenderness: There is no abdominal tenderness. There is no guarding.     Comments: Abdomen soft, nondistended, nontender to palpation in all quadrants without guarding or peritoneal signs, no CVA tenderness bilaterally  Musculoskeletal:     Comments: Tenderness to palpation over midline lower back pain, no overlying skin changes or palpable deflormity.  Pain radiates to bilateral hips.  Pain made worse with range of motion of the lower extremities  Skin:    General: Skin is warm and dry.     Capillary Refill: Capillary refill takes less than 2 seconds.  Neurological:     Mental Status: He is alert and oriented to person, place, and time.     Comments: Alert, clear speech, following commands. Moving all extremities without difficulty. Bilateral lower extremities with 5/5 strength in proximal and distal muscle groups and with dorsi  and plantar flexion. Sensation intact in bilateral lower extremities. 2+ patellar DTRs bilaterally. Ambulatory with steady gait  Psychiatric:        Behavior: Behavior normal.      ED Treatments / Results  Labs (all labs ordered are listed, but only abnormal results are displayed) Labs Reviewed - No data to display  EKG None  Radiology Dg Thoracic Spine 2 View  Result Date: 03/20/2019 CLINICAL DATA:  Back pain. EXAM: THORACIC SPINE 2 VIEWS COMPARISON:  MRI 12/09/2018. FINDINGS: Mild thoracic spine scoliosis and degenerative change. Treated midthoracic vertebral body compression fracture noted. No new bony abnormalities identified. Surgical clips right upper quadrant. IMPRESSION: Mild thoracic spine scoliosis and degenerative change. Treated midthoracic vertebral body compression fracture. No new compression fractures noted. Electronically Signed   By: Marcello Moores  Register   On: 03/20/2019 08:06   Dg Lumbar Spine Complete  Result Date: 03/20/2019 CLINICAL DATA:  48 year old male with a history of a fall and back pain EXAM: LUMBAR SPINE - COMPLETE 4+ VIEW COMPARISON:  CT June 05, 2018, plain film December 08, 2018 FINDINGS: Lumbar Spine: Lumbar vertebral elements maintain normal alignment without evidence of anterolisthesis, retrolisthesis, subluxation. No acute fracture line identified. Vertebral body heights maintained. Disc space heights maintained with no significant degenerative disc disease or endplate changes. Oblique images demonstrate no displaced pars defect. Mild facet hypertrophy at L5-S1. Cholecystectomy IMPRESSION: Negative for acute fracture or malalignment of the lumbar spine. Electronically Signed   By: Corrie Mckusick D.O.   On: 03/20/2019 08:06    Procedures Procedures (including critical care time)  Medications Ordered in ED Medications  methocarbamol (ROBAXIN) 500 mg in dextrose 5 % 50 mL IVPB (500 mg Intravenous New Bag/Given 03/20/19 0832)  lidocaine (LIDODERM) 5 % 1  patch (1 patch Transdermal Patch Applied 03/20/19 0823)  ondansetron (ZOFRAN) injection 4 mg (4 mg Intravenous Given 03/20/19 0821)  ketorolac (TORADOL) 30 MG/ML injection 30 mg (30 mg Intravenous Given 03/20/19 0822)  morphine 4 MG/ML injection 4 mg (4 mg Intravenous Given 03/20/19 0822)  predniSONE (DELTASONE) tablet  60 mg (60 mg Oral Given 03/20/19 [id number])     Initial Impression / Assessment and Plan / ED Course  I have reviewed the triage vital signs and the nursing notes.  Pertinent labs & imaging results that were available during my care of the patient were reviewed by me and considered in my medical decision making (see chart for details).  Patient presents with low back pain that radiates to bilateral hips.  He has no red flag symptoms to suggest cauda equina syndrome and a normal neurologic exam.  No associated abdominal pain or urinary symptoms.  He had recent vertebroplasty at T8, but does not have focal pain here, did have a fall and hit this area of his back a few weeks ago.  Will get x-rays of the thoracic and lumbar spine and treat pain here in the ED.  X-ray show intact and unchanged T8 vertebroplasty.  Lumbar spine films show some degenerative changes and facet hypertrophy at L5-S1, question if this is potentially irritating the nerve roots bilaterally and causing some of his symptoms versus muscle spasm contributing to sciatic inflammation.  On reevaluation patient reports pain is starting to improve.  He is ambulatory with intact neurologic exam so I see no indication for emergent MRI, but I do think patient would benefit from orthopedic follow-up and potential outpatient MRI if symptoms persist.  Will treat with course of steroids, anti-inflammatories, muscle relaxers and pain medication as well as lidocaine patches.  I have also given patient list of exercises to do to help with symptoms.  Return precautions and outpatient follow-up discussed and patient expresses understanding and  agreement with plan.  Discharged home in good condition.  Final Clinical Impressions(s) / ED Diagnoses   Final diagnoses:  Acute midline low back pain with bilateral sciatica    ED Discharge Orders         Ordered    predniSONE (DELTASONE) 20 MG tablet  Daily     03/20/19 0958    methocarbamol (ROBAXIN) 500 MG tablet  2 times daily     03/20/19 0958    naproxen (NAPROSYN) 500 MG tablet  2 times daily     03/20/19 0958    HYDROcodone-acetaminophen (NORCO) 5-325 MG tablet  Every 6 hours PRN     03/20/19 0958           Jacqlyn Larsen, PA-C 03/20/19 1025    Charlesetta Shanks, MD 03/20/19 1525

## 2019-03-20 NOTE — ED Triage Notes (Signed)
Onset last night back pain unbearable.  Pt tried ibuprofen, laying flat but nothing working.  Pt has h/o back pain with surgery.  Pt reports he fell 3 weeks ago on back and has been trying to reach ortho surgeon since but unsuccessful.

## 2019-03-20 NOTE — ED Notes (Signed)
Pt was getting in bed, could not lift left leg onto bed, oncoming nurse notified.

## 2019-03-22 ENCOUNTER — Encounter: Payer: Self-pay | Admitting: Orthopedic Surgery

## 2019-03-22 ENCOUNTER — Ambulatory Visit (INDEPENDENT_AMBULATORY_CARE_PROVIDER_SITE_OTHER): Payer: BC Managed Care – PPO | Admitting: Orthopedic Surgery

## 2019-03-22 ENCOUNTER — Other Ambulatory Visit: Payer: Self-pay | Admitting: Registered Nurse

## 2019-03-22 DIAGNOSIS — M545 Low back pain, unspecified: Secondary | ICD-10-CM

## 2019-03-22 DIAGNOSIS — I10 Essential (primary) hypertension: Secondary | ICD-10-CM

## 2019-03-22 NOTE — Telephone Encounter (Signed)
Requested medication (s) are due for refill today: no  Requested medication (s) are on the active medication list:yes  Last refill:  11/07/2018  Future visit scheduled: no  Notes to clinic:  Medication was discontinued    Requested Prescriptions  Pending Prescriptions Disp Refills   lisinopril-hydrochlorothiazide (ZESTORETIC) 10-12.5 MG tablet [Pharmacy Med Name: LISINOPRIL-HCTZ 10-12.5 MG TAB] 90 tablet 1    Sig: TAKE 1 TABLET BY MOUTH EVERY DAY     Cardiovascular:  ACEI + Diuretic Combos Failed - 03/22/2019 10:52 AM      Failed - Cr in normal range and within 180 days    Creat  Date Value Ref Range Status  01/14/2016 1.05 0.60 - 1.35 mg/dL Final   Creatinine, Ser  Date Value Ref Range Status  03/09/2019 0.67 (L) 0.76 - 1.27 mg/dL Final         Passed - Na in normal range and within 180 days    Sodium  Date Value Ref Range Status  03/09/2019 140 134 - 144 mmol/L Final         Passed - K in normal range and within 180 days    Potassium  Date Value Ref Range Status  03/09/2019 4.4 3.5 - 5.2 mmol/L Final         Passed - Ca in normal range and within 180 days    Calcium  Date Value Ref Range Status  03/09/2019 10.0 8.7 - 10.2 mg/dL Final         Passed - Patient is not pregnant      Passed - Last BP in normal range    BP Readings from Last 1 Encounters:  03/20/19 126/72         Passed - Valid encounter within last 6 months    Recent Outpatient Visits          4 months ago Encounter to establish care   [facility], Delfino Lovett, NP   1 year ago Essential hypertension, benign   Primary Care at [facility], [place], [place]   3 years ago Essential hypertension   Primary Care at [facility], [place], [place]   3 years ago Insect bites   Primary Care at [facility], Gay Filler, MD   3 years ago Essential hypertension, benign   [facility] at [facility], Janalee Dane, MD

## 2019-03-26 ENCOUNTER — Encounter: Payer: Self-pay | Admitting: Orthopedic Surgery

## 2019-03-26 NOTE — Progress Notes (Signed)
Office Visit Note   Patient: Donald Jenkins           Date of Birth: 1971/04/13           MRN: [id number] Visit Date: 03/22/2019 Requested by: Donald Blackbird, MD [place],  [place] [address] PCP: Patient, No Pcp Per  Subjective: Chief Complaint  Patient presents with  . Lower Back - Pain    HPI: [name] is a patient with low back pain.  Had a fall 3 weeks ago and hit his back on the stairs.  In June he had vertebroplasty for T8 fracture.  He fell at that time and spent 8 days in the hospital.  He was doing well since then.  However after this fall he was unable to walk or lift his leg.  Describes radicular leg pain in both legs.  Also describes lumbar stiffness.  Radiographs done at the emergency department were within normal limits.  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  Denies any bowel or bladder symptoms.  Has no known history of osteoporosis.              ROS: All systems reviewed are negative as they relate to the chief complaint within the history of present illness.  Patient denies  fevers or chills.   Assessment & Plan: Visit Diagnoses:  1. Low back pain, unspecified back pain laterality, unspecified chronicity, unspecified whether sciatica present     Plan: Impression is debilitating low back pain following a fall in a patient who has a history of prior vertebroplasty in the lower thoracic region.  Most of his pain currently is in the lumbar region.  I think is possible he may have a lumbar stress reaction or sacral stress reaction or fracture.  None of these are really visible on plain radiographs.  I would like to keep him out of work until we can get to the bottom of this with an MRI scan of his lumbar spine to evaluate for stress reaction.  Follow-Up Instructions: Return for after MRI.   Orders:  Orders Placed This Encounter  Procedures  . MR Lumbar Spine w/o contrast   No orders of the defined types were placed  in this encounter.     Procedures: No procedures performed   Clinical Data: No additional findings.  Objective: Vital Signs: There were no vitals taken for this visit.  Physical Exam:   Constitutional: Patient appears well-developed HEENT:  Head: Normocephalic Eyes:EOM are normal Neck: Normal range of motion Cardiovascular: Normal rate Pulmonary/chest: Effort normal Neurologic: Patient is alert Skin: Skin is warm Psychiatric: Patient has normal mood and affect    Ortho Exam: Ortho exam demonstrates full active and passive range of motion of the ankles knees and hips with equivocal nerve root tension signs bilaterally.  Patient does have a lot of pain with forward and lateral bending.  Pedal pulses palpable.  Reflexes symmetric with negative Babinski negative clonus.  No definite paresthesias L1 S1 bilaterally.  Specialty Comments:  No specialty comments available.  Imaging: No results found.   PMFS History: Patient Active Problem List   Diagnosis Date Noted  . Dissociative amnesia with dissociative fugue ([place]) 03/07/2019  . Acute midline thoracic back pain 01/18/2019  . Hypomagnesemia   . Fall   . Hypoglycemia 12/09/2018  . Bacteremia 12/09/2018  . Acute metabolic encephalopathy [id number]  . Metabolic acidosis [id number]  . Compression fracture of body of thoracic vertebra ([place]) 12/08/2018  .  Rhabdomyolysis 12/07/2018  . SIRS (systemic inflammatory response syndrome) ([place]) 12/07/2018  . Back pain 12/07/2018  . Depression with anxiety 12/07/2018  . Abnormal LFTs 12/07/2018  . AKI (acute kidney injury) ([place]) 12/07/2018  . Elevated troponin 12/07/2018  . Hypersomnolence disorder 11/01/2018  . Cholecystitis 06/05/2018  . Recurrent major depression in full remission ([place]) 04/05/2018  . Social anxiety disorder 03/18/2018  . Panic disorder 03/18/2018  . Essential hypertension, benign 06/06/2015   Past Medical History:  Diagnosis Date  . Anxiety   .  Hypertension   . Tachycardia     Family History  Problem Relation Age of Onset  . Lung cancer Father   . Esophageal cancer Father   . Brain cancer Father     Past Surgical History:  Procedure Laterality Date  . CHOLECYSTECTOMY N/A 06/05/2018   Procedure: LAPAROSCOPIC CHOLECYSTECTOMY WITH POSSIBLE INTRAOPERATIVE CHOLANGIOGRAM;  Surgeon: Clovis Riley, MD;  Location: [facility] OR;  Service: General;  Laterality: N/A;  . IR VERTEBROPLASTY CERV/THOR BX INC UNI/BIL INC/INJECT/IMAGING  12/13/2018  . KNEE SURGERY     Social History   Occupational History  . Occupation: Arts administrator: [employer]  Tobacco Use  . Smoking status: Former Research scientist (life sciences)  . Smokeless tobacco: Never Used  Substance and Sexual Activity  . Alcohol use: Yes    Alcohol/week: 0.0 standard drinks    Comment: socially  . Drug use: Yes    Types: Marijuana  . Sexual activity: Yes    Partners: Female    Birth control/protection: None

## 2019-03-27 ENCOUNTER — Ambulatory Visit: Payer: BC Managed Care – PPO | Admitting: Psychiatry

## 2019-03-28 ENCOUNTER — Other Ambulatory Visit: Payer: Self-pay | Admitting: Surgical

## 2019-03-28 ENCOUNTER — Ambulatory Visit
Admission: RE | Admit: 2019-03-28 | Discharge: 2019-03-28 | Disposition: A | Discharge status: Home / Self Care | Payer: BC Managed Care – PPO | Source: Ambulatory Visit | Attending: Orthopedic Surgery | Admitting: Orthopedic Surgery

## 2019-03-28 ENCOUNTER — Telehealth: Payer: Self-pay | Admitting: Orthopedic Surgery

## 2019-03-28 DIAGNOSIS — M545 Low back pain, unspecified: Secondary | ICD-10-CM

## 2019-03-28 MED ORDER — METHOCARBAMOL 500 MG PO TABS: 500.0000 mg | ORAL_TABLET | Freq: Two times a day (BID) | ORAL | 0 refills | Status: DC

## 2019-03-28 NOTE — Telephone Encounter (Signed)
Patient called. He would like a RX for muscle relaxer. His call back number is [phone]

## 2019-03-28 NOTE — Telephone Encounter (Signed)
Please advise 

## 2019-04-03 ENCOUNTER — Encounter: Payer: Self-pay | Admitting: Orthopedic Surgery

## 2019-04-03 ENCOUNTER — Ambulatory Visit (INDEPENDENT_AMBULATORY_CARE_PROVIDER_SITE_OTHER): Payer: BC Managed Care – PPO | Admitting: Orthopedic Surgery

## 2019-04-03 DIAGNOSIS — M545 Low back pain, unspecified: Secondary | ICD-10-CM

## 2019-04-04 ENCOUNTER — Ambulatory Visit: Payer: BC Managed Care – PPO | Admitting: Psychiatry

## 2019-04-05 ENCOUNTER — Encounter: Payer: Self-pay | Admitting: Orthopedic Surgery

## 2019-04-05 ENCOUNTER — Ambulatory Visit: Payer: BC Managed Care – PPO | Admitting: Orthopedic Surgery

## 2019-04-05 NOTE — Progress Notes (Signed)
Office Visit Note   Patient: Donald Jenkins           Date of Birth: Apr 26, 1971           MRN: [id number] Visit Date: 04/03/2019 Requested by: No referring provider defined for this encounter. PCP: Patient, No Pcp Per  Subjective: Chief Complaint  Patient presents with  . Lower Back - Follow-up    HPI: Donald Jenkins is a patient with low back pain.  Since have seen him he is had an MRI scan which is reviewed.  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  Does not appear to be malignant.  He is actually doing well since his initial episode where he had pretty incapacitating back pain.  He has had no pain since that hospitalization several months ago.  He has been on muscle relaxers but nothing for pain.  Describes some back stiffness but not much in terms of radiculopathy.              ROS: All systems reviewed are negative as they relate to the chief complaint within the history of present illness.  Patient denies  fevers or chills.   Assessment & Plan: Visit Diagnoses:  1. Low back pain, unspecified back pain laterality, unspecified chronicity, unspecified whether sciatica present     Plan: Impression is acute somewhat intractable back pain which is improved.  MRI scan essentially shows no nerve root compression but does show this ependymoma near the conus.  I think somehow this is likely related to his pain but I do not have a great experience with this particular entity.  I did discuss this with him at length.  I think he would be best to get a neurosurgical opinion on this but for now I think he is fine to return to work on Thursday.  No other real disc pathology or significant facet arthritis or nerve compression is present on the scan.  Follow-up with Dr. Saintclair Halsted or Dr. Ronnald Ramp for neurosurgical opinion on this matter.  Follow-Up Instructions: Return if symptoms worsen or fail to improve.   Orders:  Orders Placed This Encounter  Procedures  .  Ambulatory referral to Neurosurgery   No orders of the defined types were placed in this encounter.     Procedures: No procedures performed   Clinical Data: No additional findings.  Objective: Vital Signs: There were no vitals taken for this visit.  Physical Exam:   Constitutional: Patient appears well-developed HEENT:  Head: Normocephalic Eyes:EOM are normal Neck: Normal range of motion Cardiovascular: Normal rate Pulmonary/chest: Effort normal Neurologic: Patient is alert Skin: Skin is warm Psychiatric: Patient has normal mood and affect    Ortho Exam: Ortho exam demonstrates normal gait alignment with no muscle atrophy in the legs.  No nerve root tension signs.  Fairly minimal pain with forward lateral bending.  Exam is otherwise unchanged.  Specialty Comments:  No specialty comments available.  Imaging: No results found.   PMFS History: Patient Active Problem List   Diagnosis Date Noted  . Dissociative amnesia with dissociative fugue ([place]) 03/07/2019  . Acute midline thoracic back pain 01/18/2019  . Hypomagnesemia   . Fall   . Hypoglycemia 12/09/2018  . Bacteremia 12/09/2018  . Acute metabolic encephalopathy [id number]  . Metabolic acidosis [id number]  . Compression fracture of body of thoracic vertebra ([place]) 12/08/2018  . Rhabdomyolysis 12/07/2018  . SIRS (systemic inflammatory response syndrome) ([place]) 12/07/2018  . Back pain 12/07/2018  . Depression with  anxiety 12/07/2018  . Abnormal LFTs 12/07/2018  . AKI (acute kidney injury) ([place]) 12/07/2018  . Elevated troponin 12/07/2018  . Hypersomnolence disorder 11/01/2018  . Cholecystitis 06/05/2018  . Recurrent major depression in full remission ([place]) 04/05/2018  . Social anxiety disorder 03/18/2018  . Panic disorder 03/18/2018  . Essential hypertension, benign 06/06/2015   Past Medical History:  Diagnosis Date  . Anxiety   . Hypertension   . Tachycardia     Family History  Problem Relation Age  of Onset  . Lung cancer Father   . Esophageal cancer Father   . Brain cancer Father     Past Surgical History:  Procedure Laterality Date  . CHOLECYSTECTOMY N/A 06/05/2018   Procedure: LAPAROSCOPIC CHOLECYSTECTOMY WITH POSSIBLE INTRAOPERATIVE CHOLANGIOGRAM;  Surgeon: Clovis Riley, MD;  Location: [facility] OR;  Service: General;  Laterality: N/A;  . IR VERTEBROPLASTY CERV/THOR BX INC UNI/BIL INC/INJECT/IMAGING  12/13/2018  . KNEE SURGERY     Social History   Occupational History  . Occupation: Arts administrator: [employer]  Tobacco Use  . Smoking status: Former Research scientist (life sciences)  . Smokeless tobacco: Never Used  Substance and Sexual Activity  . Alcohol use: Yes    Alcohol/week: 0.0 standard drinks    Comment: socially  . Drug use: Yes    Types: Marijuana  . Sexual activity: Yes    Partners: Female    Birth control/protection: None

## 2019-04-06 ENCOUNTER — Telehealth: Payer: Self-pay | Admitting: Orthopedic Surgery

## 2019-04-06 NOTE — Telephone Encounter (Signed)
See other note. Waiting to be advised by Dr Dean 

## 2019-04-06 NOTE — Telephone Encounter (Signed)
Left message on voicemail.  Sabrina sent neurosurgeon referral to Dr. Annamaria Boots office on 04/05/19, advised patient that it will be reviewed by Dr. Saintclair Halsted and will be called to schedule. I did give him the phone number to Dr. Saintclair Halsted [phone].

## 2019-04-06 NOTE — Telephone Encounter (Signed)
Pt Called in said he just called in to request pain medication and forgot to give his pharmacy [facility] [address].  Pt called in said dr.dean was suppose to be referring him to a neurosurgeon and he hasn't heard about it since his last appt?  [phone]

## 2019-04-06 NOTE — Telephone Encounter (Signed)
Patient called. Says nothing is helping with pain. He would like some pain medication. His call back number is [phone]

## 2019-04-06 NOTE — Telephone Encounter (Signed)
Dr Marlou Sa please advise on pain medication.  Fleeta Emmer can you please check status of referral and let him know. Thanks.

## 2019-04-07 ENCOUNTER — Other Ambulatory Visit: Payer: BC Managed Care – PPO

## 2019-04-07 MED ORDER — METHOCARBAMOL 500 MG PO TABS: 500.0000 mg | ORAL_TABLET | Freq: Three times a day (TID) | ORAL | 0 refills | Status: DC | PRN

## 2019-04-07 MED ORDER — TRAMADOL HCL 50 MG PO TABS: 50.0000 mg | ORAL_TABLET | Freq: Three times a day (TID) | ORAL | 0 refills | Status: DC | PRN

## 2019-04-07 NOTE — Telephone Encounter (Signed)
Okay for tramadol 1 p.o. every 8 hours as needed pain #30 and Robaxin 500 mg p.o. every 8 #30.  Please call thanks

## 2019-04-07 NOTE — Telephone Encounter (Signed)
Called to pharmacy. Patient aware. Patient also understands referral has been entered for Dr. Saintclair Halsted and that office will be reviewing his notes and call to schedule appointment.

## 2019-04-10 ENCOUNTER — Ambulatory Visit: Payer: BC Managed Care – PPO | Admitting: Orthopedic Surgery

## 2019-04-11 ENCOUNTER — Other Ambulatory Visit: Payer: Self-pay | Admitting: Psychiatry

## 2019-04-11 ENCOUNTER — Other Ambulatory Visit: Payer: Self-pay | Admitting: Registered Nurse

## 2019-04-11 DIAGNOSIS — F441 Dissociative fugue: Secondary | ICD-10-CM

## 2019-04-11 DIAGNOSIS — R0602 Shortness of breath: Secondary | ICD-10-CM

## 2019-04-11 DIAGNOSIS — F4010 Social phobia, unspecified: Secondary | ICD-10-CM

## 2019-04-11 DIAGNOSIS — F410 Panic disorder [episodic paroxysmal anxiety] without agoraphobia: Secondary | ICD-10-CM

## 2019-04-11 NOTE — Telephone Encounter (Signed)
Requested medication (s) are due for refill today: yes  Requested medication (s) are on the active medication list: yes  Last refill:  03/10/2019  Future visit scheduled: no  Notes to clinic:  Last filled by historical provider    Requested Prescriptions  Pending Prescriptions Disp Refills   montelukast (SINGULAIR) 10 MG tablet [Pharmacy Med Name: MONTELUKAST SOD 10 MG TABLET] 90 tablet 1    Sig: TAKE 1 TABLET BY MOUTH EVERYDAY AT BEDTIME     Pulmonology:  Leukotriene Inhibitors Passed - 04/11/2019  2:33 PM      Passed - Valid encounter within last 12 months    Recent Outpatient Visits          5 months ago Encounter to establish care   [facility], Delfino Lovett, NP   1 year ago Essential hypertension, benign   Primary Care at [place], [place]   3 years ago Essential hypertension   Primary Care at [place], [place]   3 years ago Insect bites   Primary Care at [facility], Gay Filler, MD   3 years ago Essential hypertension, benign   [facility], Janalee Dane, MD

## 2019-04-11 NOTE — Telephone Encounter (Signed)
Requesting 90 day has appt 11/05

## 2019-04-11 NOTE — Telephone Encounter (Signed)
[name] cancelled last appointment last week rescheduled for 05/04/2019 needing continuation of the increase in Seroquel from last visit 50 mg nightly sent as #90 no [facility] [address] [facility] necessary no contraindication.

## 2019-04-12 ENCOUNTER — Ambulatory Visit: Payer: Self-pay | Admitting: Neurology

## 2019-04-12 ENCOUNTER — Telehealth: Payer: Self-pay | Admitting: *Deleted

## 2019-04-12 ENCOUNTER — Other Ambulatory Visit: Payer: Self-pay | Admitting: Family Medicine

## 2019-04-12 DIAGNOSIS — I10 Essential (primary) hypertension: Secondary | ICD-10-CM

## 2019-04-12 NOTE — Telephone Encounter (Signed)
Can we schedule patient for an appointment with Donald Jenkins, According to his last refill he was to make an appointment before prescription ran out. 30 days were sent , no more refills  until he is seen.  Thank You

## 2019-04-14 NOTE — Telephone Encounter (Signed)
LVMTCB and schedule appt for med refills

## 2019-04-20 ENCOUNTER — Ambulatory Visit: Payer: BC Managed Care – PPO | Admitting: Family Medicine

## 2019-04-25 ENCOUNTER — Other Ambulatory Visit: Payer: Self-pay | Admitting: Registered Nurse

## 2019-04-25 DIAGNOSIS — R0602 Shortness of breath: Secondary | ICD-10-CM

## 2019-04-26 ENCOUNTER — Encounter: Payer: Self-pay | Admitting: *Deleted

## 2019-04-26 NOTE — Telephone Encounter (Signed)
Requested medication (s) are due for refill today: yes  Requested medication (s) are on the active medication list: yes  Last refill:  04/11/2019  Future visit scheduled: no  Notes to clinic: requesting 90 day supply   Requested Prescriptions  Pending Prescriptions Disp Refills   montelukast (SINGULAIR) 10 MG tablet [Pharmacy Med Name: MONTELUKAST SOD 10 MG TABLET] 90 tablet 1    Sig: TAKE 1 TABLET BY MOUTH EVERYDAY AT BEDTIME     Pulmonology:  Leukotriene Inhibitors Passed - 04/25/2019  8:29 PM      Passed - Valid encounter within last 12 months    Recent Outpatient Visits          6 months ago Encounter to establish care   [facility], Delfino Lovett, NP   1 year ago Essential hypertension, benign   Primary Care at [place], [place]   3 years ago Essential hypertension   Primary Care at [place], [place]   3 years ago Insect bites   Primary Care at [facility], Gay Filler, MD   3 years ago Essential hypertension, benign   [facility], Janalee Dane, MD

## 2019-04-28 ENCOUNTER — Other Ambulatory Visit: Payer: Self-pay | Admitting: Orthopedic Surgery

## 2019-04-28 NOTE — Telephone Encounter (Signed)
Rx request 

## 2019-05-01 NOTE — Telephone Encounter (Signed)
Ok to rf pls cal htx

## 2019-05-02 ENCOUNTER — Other Ambulatory Visit: Payer: Self-pay | Admitting: Psychiatry

## 2019-05-02 DIAGNOSIS — F441 Dissociative fugue: Secondary | ICD-10-CM

## 2019-05-02 DIAGNOSIS — F410 Panic disorder [episodic paroxysmal anxiety] without agoraphobia: Secondary | ICD-10-CM

## 2019-05-02 DIAGNOSIS — F4010 Social phobia, unspecified: Secondary | ICD-10-CM

## 2019-05-02 NOTE — Telephone Encounter (Signed)
Scheduled 11/05 °

## 2019-05-02 NOTE — Telephone Encounter (Signed)
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

## 2019-05-03 ENCOUNTER — Telehealth: Payer: Self-pay | Admitting: Psychiatry

## 2019-05-03 NOTE — Telephone Encounter (Signed)
Please disregard. Pt found meds

## 2019-05-03 NOTE — Telephone Encounter (Signed)
Pt called to report picked up Rx refill for Xanax yesterday and has lost it. Looked everywhere house,car can't find. Ask that you return his call @ [phone].  Pt Not sure what to do

## 2019-05-04 ENCOUNTER — Ambulatory Visit: Payer: BLUE CROSS/BLUE SHIELD | Admitting: Psychiatry

## 2019-05-18 ENCOUNTER — Observation Stay (HOSPITAL_COMMUNITY)
Admission: EM | Admit: 2019-05-18 | Discharge: 2019-05-20 | Disposition: A | Discharge status: Home / Self Care | Payer: BC Managed Care – PPO | Attending: Internal Medicine | Admitting: Internal Medicine

## 2019-05-18 ENCOUNTER — Emergency Department (HOSPITAL_COMMUNITY): Payer: BC Managed Care – PPO

## 2019-05-18 ENCOUNTER — Other Ambulatory Visit: Payer: Self-pay

## 2019-05-18 ENCOUNTER — Emergency Department (HOSPITAL_COMMUNITY)
Admission: EM | Admit: 2019-05-18 | Discharge: 2019-05-18 | Disposition: A | Discharge status: Home / Self Care | Payer: BC Managed Care – PPO | Source: Home / Self Care | Attending: Emergency Medicine | Admitting: Emergency Medicine

## 2019-05-18 DIAGNOSIS — F10939 Alcohol use, unspecified with withdrawal, unspecified: Secondary | ICD-10-CM | POA: Diagnosis not present

## 2019-05-18 DIAGNOSIS — R112 Nausea with vomiting, unspecified: Secondary | ICD-10-CM | POA: Insufficient documentation

## 2019-05-18 DIAGNOSIS — F10931 Alcohol use, unspecified with withdrawal delirium: Secondary | ICD-10-CM

## 2019-05-18 DIAGNOSIS — Z9049 Acquired absence of other specified parts of digestive tract: Secondary | ICD-10-CM | POA: Diagnosis not present

## 2019-05-18 DIAGNOSIS — Z791 Long term (current) use of non-steroidal anti-inflammatories (NSAID): Secondary | ICD-10-CM | POA: Insufficient documentation

## 2019-05-18 DIAGNOSIS — M6282 Rhabdomyolysis: Secondary | ICD-10-CM | POA: Diagnosis not present

## 2019-05-18 DIAGNOSIS — Z79899 Other long term (current) drug therapy: Secondary | ICD-10-CM | POA: Insufficient documentation

## 2019-05-18 DIAGNOSIS — R569 Unspecified convulsions: Principal | ICD-10-CM

## 2019-05-18 DIAGNOSIS — R2681 Unsteadiness on feet: Secondary | ICD-10-CM | POA: Insufficient documentation

## 2019-05-18 DIAGNOSIS — G9340 Encephalopathy, unspecified: Secondary | ICD-10-CM | POA: Insufficient documentation

## 2019-05-18 DIAGNOSIS — I10 Essential (primary) hypertension: Secondary | ICD-10-CM | POA: Insufficient documentation

## 2019-05-18 DIAGNOSIS — F419 Anxiety disorder, unspecified: Secondary | ICD-10-CM | POA: Diagnosis not present

## 2019-05-18 DIAGNOSIS — D72829 Elevated white blood cell count, unspecified: Secondary | ICD-10-CM | POA: Insufficient documentation

## 2019-05-18 DIAGNOSIS — Z87891 Personal history of nicotine dependence: Secondary | ICD-10-CM | POA: Insufficient documentation

## 2019-05-18 DIAGNOSIS — N179 Acute kidney failure, unspecified: Secondary | ICD-10-CM | POA: Diagnosis not present

## 2019-05-18 DIAGNOSIS — Z20828 Contact with and (suspected) exposure to other viral communicable diseases: Secondary | ICD-10-CM | POA: Diagnosis not present

## 2019-05-18 DIAGNOSIS — E872 Acidosis, unspecified: Secondary | ICD-10-CM | POA: Diagnosis present

## 2019-05-18 DIAGNOSIS — F10231 Alcohol dependence with withdrawal delirium: Secondary | ICD-10-CM | POA: Diagnosis present

## 2019-05-18 DIAGNOSIS — R197 Diarrhea, unspecified: Secondary | ICD-10-CM | POA: Insufficient documentation

## 2019-05-18 DIAGNOSIS — F1210 Cannabis abuse, uncomplicated: Secondary | ICD-10-CM | POA: Insufficient documentation

## 2019-05-18 DIAGNOSIS — Z7289 Other problems related to lifestyle: Secondary | ICD-10-CM

## 2019-05-18 LAB — COMPREHENSIVE METABOLIC PANEL
ALT: 24 U/L (ref 0–44)
ALT: 28 U/L (ref 0–44)
AST: 30 U/L (ref 15–41)
AST: 40 U/L (ref 15–41)
Albumin: 4 g/dL (ref 3.5–5.0)
Albumin: 4.6 g/dL (ref 3.5–5.0)
Alkaline Phosphatase: 74 U/L (ref 38–126)
Alkaline Phosphatase: 85 U/L (ref 38–126)
Anion gap: 14 (ref 5–15)
Anion gap: 19 — ABNORMAL HIGH (ref 5–15)
BUN: 28 mg/dL — ABNORMAL HIGH (ref 6–20)
BUN: 36 mg/dL — ABNORMAL HIGH (ref 6–20)
CO2: 21 mmol/L — ABNORMAL LOW (ref 22–32)
CO2: 19 mmol/L — ABNORMAL LOW (ref 22–32)
Calcium: 8.1 mg/dL — ABNORMAL LOW (ref 8.9–10.3)
Calcium: 9.1 mg/dL (ref 8.9–10.3)
Chloride: 104 mmol/L (ref 98–111)
Chloride: 99 mmol/L (ref 98–111)
Creatinine, Ser: 1.35 mg/dL — ABNORMAL HIGH (ref 0.61–1.24)
Creatinine, Ser: 1.47 mg/dL — ABNORMAL HIGH (ref 0.61–1.24)
GFR calc Af Amer: >60 mL/min (ref >60)
GFR calc Af Amer: >60 mL/min (ref >60)
GFR calc non Af Amer: >60 mL/min (ref >60)
GFR calc non Af Amer: 56 mL/min — ABNORMAL LOW (ref >60)
Glucose, Bld: 106 mg/dL — ABNORMAL HIGH (ref 70–99)
Glucose, Bld: 154 mg/dL — ABNORMAL HIGH (ref 70–99)
Potassium: 3.7 mmol/L (ref 3.5–5.1)
Potassium: 3.5 mmol/L (ref 3.5–5.1)
Sodium: 139 mmol/L (ref 135–145)
Sodium: 137 mmol/L (ref 135–145)
Total Bilirubin: 1 mg/dL (ref 0.3–1.2)
Total Bilirubin: 0.9 mg/dL (ref 0.3–1.2)
Total Protein: 6.9 g/dL (ref 6.5–8.1)
Total Protein: 7.7 g/dL (ref 6.5–8.1)

## 2019-05-18 LAB — ETHANOL: Alcohol, Ethyl (B): <10 mg/dL (ref <10)

## 2019-05-18 LAB — URINALYSIS, ROUTINE W REFLEX MICROSCOPIC
Bilirubin Urine: NEGATIVE
Glucose, UA: NEGATIVE mg/dL
Ketones, ur: 20 mg/dL — AB
Leukocytes,Ua: NEGATIVE
Nitrite: NEGATIVE
Protein, ur: 100 mg/dL — AB
Specific Gravity, Urine: 1.025 (ref 1.005–1.030)
pH: 5 (ref 5.0–8.0)

## 2019-05-18 LAB — ACETAMINOPHEN LEVEL: Acetaminophen (Tylenol), Serum: <10 ug/mL — ABNORMAL LOW (ref 10–30)

## 2019-05-18 LAB — CBC WITH DIFFERENTIAL/PLATELET
Abs Immature Granulocytes: 0.07 10*3/uL (ref 0.00–0.07)
Abs Immature Granulocytes: 0.08 10*3/uL — ABNORMAL HIGH (ref 0.00–0.07)
Basophils Absolute: 0 10*3/uL (ref 0.0–0.1)
Basophils Absolute: 0 10*3/uL (ref 0.0–0.1)
Basophils Relative: 0 %
Basophils Relative: 0 %
Eosinophils Absolute: 0 10*3/uL (ref 0.0–0.5)
Eosinophils Absolute: 0 10*3/uL (ref 0.0–0.5)
Eosinophils Relative: 0 %
Eosinophils Relative: 0 %
HCT: 44.6 % (ref 39.0–52.0)
HCT: 43.2 % (ref 39.0–52.0)
Hemoglobin: 15.4 g/dL (ref 13.0–17.0)
Hemoglobin: 14.5 g/dL (ref 13.0–17.0)
Immature Granulocytes: 0 %
Immature Granulocytes: 1 %
Lymphocytes Relative: 5 %
Lymphocytes Relative: 6 %
Lymphs Abs: 0.8 10*3/uL (ref 0.7–4.0)
Lymphs Abs: 0.9 10*3/uL (ref 0.7–4.0)
MCH: 28.9 pg (ref 26.0–34.0)
MCH: 28.5 pg (ref 26.0–34.0)
MCHC: 34.5 g/dL (ref 30.0–36.0)
MCHC: 33.6 g/dL (ref 30.0–36.0)
MCV: 83.8 fL (ref 80.0–100.0)
MCV: 84.9 fL (ref 80.0–100.0)
Monocytes Absolute: 0.9 10*3/uL (ref 0.1–1.0)
Monocytes Absolute: 0.8 10*3/uL (ref 0.1–1.0)
Monocytes Relative: 5 %
Monocytes Relative: 5 %
Neutro Abs: 14.9 10*3/uL — ABNORMAL HIGH (ref 1.7–7.7)
Neutro Abs: 12.6 10*3/uL — ABNORMAL HIGH (ref 1.7–7.7)
Neutrophils Relative %: 90 %
Neutrophils Relative %: 88 %
Platelets: 277 10*3/uL (ref 150–400)
Platelets: 273 10*3/uL (ref 150–400)
RBC: 5.32 MIL/uL (ref 4.22–5.81)
RBC: 5.09 MIL/uL (ref 4.22–5.81)
RDW: 13.4 % (ref 11.5–15.5)
RDW: 13.3 % (ref 11.5–15.5)
WBC: 16.7 10*3/uL — ABNORMAL HIGH (ref 4.0–10.5)
WBC: 14.4 10*3/uL — ABNORMAL HIGH (ref 4.0–10.5)
nRBC: 0 % (ref 0.0–0.2)
nRBC: 0 % (ref 0.0–0.2)

## 2019-05-18 LAB — LIPASE, BLOOD
Lipase: 34 U/L (ref 11–51)
Lipase: 35 U/L (ref 11–51)

## 2019-05-18 LAB — TSH: TSH: 1.098 u[IU]/mL (ref 0.350–4.500)

## 2019-05-18 LAB — SALICYLATE LEVEL: Salicylate Lvl: <7 mg/dL (ref 2.8–30.0)

## 2019-05-18 MED ORDER — THIAMINE HCL 100 MG/ML IJ SOLN
100.0000 mg | Freq: Once | INTRAMUSCULAR | Status: AC
  Administered 2019-05-18: 100 mg via INTRAVENOUS
  Filled 2019-05-18: qty 2

## 2019-05-18 MED ORDER — LORAZEPAM 1 MG PO TABS: 0.0000 mg | ORAL_TABLET | Freq: Two times a day (BID) | ORAL | Status: DC

## 2019-05-18 MED ORDER — SODIUM CHLORIDE 0.9 % IV BOLUS
1000.0000 mL | Freq: Once | INTRAVENOUS | Status: AC
  Administered 2019-05-18: 1000 mL via INTRAVENOUS

## 2019-05-18 MED ORDER — LORAZEPAM 2 MG/ML IJ SOLN: 0.0000 mg | Freq: Four times a day (QID) | INTRAMUSCULAR | Status: DC

## 2019-05-18 MED ORDER — LORAZEPAM 2 MG/ML IJ SOLN: 0.0000 mg | Freq: Two times a day (BID) | INTRAMUSCULAR | Status: DC

## 2019-05-18 MED ORDER — ONDANSETRON 4 MG PO TBDP: 4.0000 mg | ORAL_TABLET | Freq: Three times a day (TID) | ORAL | 0 refills | Status: DC | PRN

## 2019-05-18 MED ORDER — ONDANSETRON HCL 4 MG/2ML IJ SOLN
4.0000 mg | Freq: Once | INTRAMUSCULAR | Status: AC
  Administered 2019-05-18: 4 mg via INTRAVENOUS
  Filled 2019-05-18: qty 2

## 2019-05-18 MED ORDER — LORAZEPAM 2 MG/ML IJ SOLN
1.0000 mg | Freq: Once | INTRAMUSCULAR | Status: AC
  Administered 2019-05-18: 1 mg via INTRAVENOUS
  Filled 2019-05-18: qty 1

## 2019-05-18 MED ORDER — LORAZEPAM 1 MG PO TABS
0.0000 mg | ORAL_TABLET | Freq: Four times a day (QID) | ORAL | Status: DC
  Administered 2019-05-19: 1 mg via ORAL
  Filled 2019-05-18: qty 1

## 2019-05-18 MED ORDER — SODIUM CHLORIDE 0.9 % IV BOLUS: 1000.0000 mL | Freq: Once | INTRAVENOUS | Status: DC

## 2019-05-18 NOTE — H&P (Signed)
History and Physical    Donald Jenkins [id number] DOB: February 03, 1971 DOA: 05/18/2019  PCP: Patient, No Pcp Per  Chief Complaint: Seizure-like activity  HPI: Donald Jenkins is a 48 y.o. male with medical history significant of hypertension, anxiety, history of cholecystectomy presenting to the hospital via EMS for evaluation of seizure-like activity.  Per EMS report, bystanders witnessed patient having seizure-like activity for 3 minutes in the middle of the road.  Upon EMS arrival, patient was AAO x1 and confused.  Patient was seen in the ED earlier today with complaints of nausea and vomiting.  He was given IV fluid hydration and antiemetic and discharged home.  Patient denies history of seizures.  States he was previously drinking alcohol but has not had any alcoholic beverages for the past 1 month.  Denies any recent drug use, states he last used Mariajuana 2 weeks ago.  States he was vomiting yesterday but that has resolved.  He is no longer nauseous.  He is not having any abdominal pain or diarrhea.  No additional history could be obtained from the patient.  ED Course: Tachycardic with heart rate in the low 100s to 120s.  BP slightly elevated. Afebrile.  WBC count 14.4.  Bicarb 19, anion gap 19.  Blood glucose 154.  BUN 36, creatinine 1.4.  Baseline creatinine 0.6.  Lipase and LFTs normal.  UDS pending.  TSH normal.  Blood ethanol level undetectable.  Acetaminophen and salicylate levels undetectable.  CK pending.  Head CT negative for acute intracranial abnormality.  Patient received Ativan, Zofran, thiamine, and 1 L normal saline bolus.  Review of Systems:  All systems reviewed and apart from history of presenting illness, are negative.  Past Medical History:  Diagnosis Date   Anxiety    Hypertension    Tachycardia     Past Surgical History:  Procedure Laterality Date   CHOLECYSTECTOMY N/A 06/05/2018   Procedure: LAPAROSCOPIC CHOLECYSTECTOMY WITH POSSIBLE INTRAOPERATIVE  CHOLANGIOGRAM;  Surgeon: Clovis Riley, MD;  Location: [facility];  Service: General;  Laterality: N/A;   IR VERTEBROPLASTY CERV/THOR BX INC UNI/BIL INC/INJECT/IMAGING  12/13/2018   KNEE SURGERY       reports that he has quit smoking. He has never used smokeless tobacco. He reports current alcohol use. He reports current drug use. Drug: Marijuana.  Allergies  Allergen Reactions   Penicillins Other (See Comments)    Did it involve swelling of the face/tongue/throat, SOB, or low BP? Unknown Did it involve sudden or severe rash/hives, skin peeling, or any reaction on the inside of your mouth or nose? Unknown Did you need to seek medical attention at a hospital or doctor's office? Unknown When did it last happen?childhood If all above answers are NO, may proceed with cephalosporin use.     Family History  Problem Relation Age of Onset   Lung cancer Father    Esophageal cancer Father    Brain cancer Father     Prior to Admission medications   Medication Sig Start Date End Date Taking? Authorizing Provider  ALPRAZolam Duanne Moron) 1 MG tablet TAKE 1 TABLET BY MOUTH 3 TIMES A DAY AS NEEDED FOR ANXIETY 05/02/19   Delight Hoh, MD  amLODipine (NORVASC) 5 MG tablet TAKE 1 TABLET (5 MG TOTAL) BY MOUTH DAILY. TO HELP LOWER BLOOD PRESSURE 04/12/19   Fulp, Cammie, MD  HYDROcodone-acetaminophen (NORCO) 5-325 MG tablet Take 1 tablet by mouth every 6 (six) hours as needed. 03/20/19   Jacqlyn Larsen, PA-C  levocetirizine Harlow Ohms) 5  MG tablet Take 1 tablet (5 mg total) by mouth every evening. To help with nasal allergies Patient not taking: Reported on 03/02/2019 01/18/19   Fulp, Ander Gaster, MD  methocarbamol (ROBAXIN) 500 MG tablet Take 1 tablet (500 mg total) by mouth 2 (two) times daily. 03/28/19   Magnant, Charles L, PA-C  methocarbamol (ROBAXIN) 500 MG tablet TAKE 1 TABLET (500 MG TOTAL) BY MOUTH EVERY 8 (EIGHT) HOURS AS NEEDED FOR MUSCLE SPASMS. 05/01/19   Magnant, Charles L, PA-C  montelukast  (SINGULAIR) 10 MG tablet TAKE 1 TABLET BY MOUTH EVERYDAY AT BEDTIME 04/12/19   Maximiano Coss, NP  naproxen (NAPROSYN) 500 MG tablet Take 1 tablet (500 mg total) by mouth 2 (two) times daily. 03/20/19   Jacqlyn Larsen, PA-C  ondansetron (ZOFRAN ODT) 4 MG disintegrating tablet Take 1 tablet (4 mg total) by mouth every 8 (eight) hours as needed for nausea or vomiting. 05/18/19   Tacy Learn, PA-C  QUEtiapine (SEROQUEL) 50 MG tablet TAKE 1 TABLET BY MOUTH EVERYDAY AT BEDTIME 04/11/19   Delight Hoh, MD  traMADol (ULTRAM) 50 MG tablet Take 1 tablet (50 mg total) by mouth every 8 (eight) hours as needed. 04/07/19   Meredith Pel, MD  zaleplon (SONATA) 10 MG capsule Take 10 mg by mouth at bedtime as needed for sleep.  02/16/19   [provider]  methylphenidate (RITALIN) 5 MG tablet Take 1 tablet (5 mg total) by mouth 3 (three) times daily with meals. Patient not taking: Reported on 12/22/2018 11/16/18 03/03/19  Delight Hoh, MD  mirtazapine (REMERON) 15 MG tablet Take 1 tablet (15 mg total) by mouth at bedtime. Patient not taking: Reported on 03/02/2019 01/18/19 03/03/19  Antony Blackbird, MD    Physical Exam: Vitals:   05/18/19 2330 05/19/19 0000 05/19/19 0149 05/19/19 0504  BP: 131/84 123/75 (!) 141/76 129/81  Pulse: 100 (!) 104 95 (!) 101  Resp: 20 17 16 18   Temp:   98.2 F (36.8 C) 97.9 F (36.6 C)  TempSrc:   Oral Oral  SpO2: 100% 100% 100% 98%  Weight:   83.9 kg   Height:   6\' 1"  (1.854 m)     Physical Exam  Constitutional: He is oriented to person, place, and time. He appears well-developed and well-nourished. No distress.  HENT:  Head: Normocephalic.  Eyes: Right eye exhibits no discharge. Left eye exhibits no discharge.  Neck: Neck supple.  Cardiovascular: Normal rate, regular rhythm and intact distal pulses.  Pulmonary/Chest: Effort normal and breath sounds normal. No respiratory distress. He has no wheezes. He has no rales.  Abdominal: Soft. Bowel sounds are  normal. He exhibits no distension. There is no abdominal tenderness. There is no guarding.  Musculoskeletal:        General: No edema.  Neurological: He is alert and oriented to person, place, and time.  Slow to respond to questions No focal weakness or sensory deficit.  Skin: Skin is warm and dry. He is not diaphoretic.     Labs on Admission: I have personally reviewed following labs and imaging studies  CBC: Recent Labs  Lab 05/18/19 1059 05/18/19 2055 05/19/19 0532  WBC 16.7* 14.4* 13.2*  NEUTROABS 14.9* 12.6*  --   HGB 15.4 14.5 12.5*  HCT 44.6 43.2 37.9*  MCV 83.8 84.9 85.9  PLT 277 273 [id number]   Basic Metabolic Panel: Recent Labs  Lab 05/18/19 1453 05/18/19 2055 05/19/19 0532  NA 139 137 132*  K 3.7 3.5 3.5  CL  104 99 100  CO2 21* 19* 20*  GLUCOSE 106* 154* 94  BUN 28* 36* 26*  CREATININE 1.35* 1.47* 0.85  CALCIUM 8.1* 9.1 8.2*   GFR: Estimated Creatinine Clearance: 121.4 mL/min (by C-G formula based on SCr of 0.85 mg/dL). Liver Function Tests: Recent Labs  Lab 05/18/19 1453 05/18/19 2055  AST 30 40  ALT 24 28  ALKPHOS 74 85  BILITOT 1.0 0.9  PROT 6.9 7.7  ALBUMIN 4.0 4.6   Recent Labs  Lab 05/18/19 1453 05/18/19 2055  LIPASE 34 35   No results for input(s): AMMONIA in the last 168 hours. Coagulation Profile: No results for input(s): INR, PROTIME in the last 168 hours. Cardiac Enzymes: Recent Labs  Lab 05/18/19 2055  CKTOTAL 626*   BNP (last 3 results) No results for input(s): PROBNP in the last 8760 hours. HbA1C: No results for input(s): HGBA1C in the last 72 hours. CBG: No results for input(s): GLUCAP in the last 168 hours. Lipid Profile: No results for input(s): CHOL, HDL, LDLCALC, TRIG, CHOLHDL, LDLDIRECT in the last 72 hours. Thyroid Function Tests: Recent Labs    05/18/19 2055  TSH 1.098   Anemia Panel: No results for input(s): VITAMINB12, FOLATE, FERRITIN, TIBC, IRON, RETICCTPCT in the last 72 hours. Urine analysis:      Component Value Date/Time   COLORURINE AMBER (A) 05/18/2019 1059   APPEARANCEUR CLOUDY (A) 05/18/2019 1059   LABSPEC 1.025 05/18/2019 1059   PHURINE 5.0 05/18/2019 1059   GLUCOSEU NEGATIVE 05/18/2019 1059   HGBUR SMALL (A) 05/18/2019 1059   BILIRUBINUR NEGATIVE 05/18/2019 1059   BILIRUBINUR small (A) 06/06/2015 1020   BILIRUBINUR neg 07/23/2014 1058   KETONESUR 20 (A) 05/18/2019 1059   PROTEINUR 100 (A) 05/18/2019 1059   UROBILINOGEN 0.2 06/06/2015 1020   UROBILINOGEN 0.2 02/12/2014 2204   NITRITE NEGATIVE 05/18/2019 1059   LEUKOCYTESUR NEGATIVE 05/18/2019 1059    Radiological Exams on Admission: Ct Head Wo Contrast  Result Date: 05/18/2019 CLINICAL DATA:  Seizure EXAM: CT HEAD WITHOUT CONTRAST TECHNIQUE: Contiguous axial images were obtained from the base of the skull through the vertex without intravenous contrast. COMPARISON:  CT brain 03/02/2019 FINDINGS: Brain: No acute territorial infarction, hemorrhage or intracranial mass. The ventricles are nonenlarged. Vascular: No hyperdense vessels.  No unexpected calcification Skull: Normal. Negative for fracture or focal lesion. Sinuses/Orbits: No acute finding. Other: None IMPRESSION: Negative non contrasted CT appearance of the brain Electronically Signed   By: Donavan Foil M.D.   On: 05/18/2019 22:13    Assessment/Plan Principal Problem:   Seizure ([place]) Active Problems:   AKI (acute kidney injury) ([place])   Metabolic acidosis   Alcohol use   Seizure Patient had a seizure witnessed by a bystander on the road.  No prior history of seizures.  Question whether this seizure is related to alcohol withdrawal as patient is adamant that he has not used alcohol for over a month.  No further seizure activity since he has been in the hospital.  Has mild leukocytosis, likely reactive.  No infectious signs or symptoms.  Head CT negative.  Discussed with Dr. Rory Percy from neurology, not recommending starting antiepileptic at this time as this is the  patient's first seizure.  Recommending further work-up with EEG and MRI, if abnormal neurology can be consulted in a.m. -EEG -Brain MRI -Seizure precautions -Ativan as needed  History of alcohol use Patient denies alcohol use in over a month.  Does have tachycardia and mild hypertension. TSH normal. ?withdrawal -CIWA protocol; Ativan  as needed -Thiamine, folate, multivitamin  High anion gap metabolic acidosis Suspect related to seizure activity. Bicarb 19, anion gap 19.   -IV fluid hydration -Continue to monitor BMP  AKI Suspect prerenal from dehydration/ mild rhabdo from seizure (CK mildly elevated).  BUN 36, creatinine 1.4.  Baseline creatinine 0.6.  -IV fluid hydration -Continue monitor renal function -Monitor urine output  DVT prophylaxis: Lovenox Code Status: Full code Family Communication: No family available. Disposition Plan: Anticipate discharge after clinical improvement. Consults called: None Admission status: It is my clinical opinion that referral for OBSERVATION is reasonable and necessary in this patient based on the above information provided. The aforementioned taken together are felt to place the patient at high risk for further clinical deterioration. However it is anticipated that the patient may be medically stable for discharge from the hospital within 24 to 48 hours.  The medical decision making on this patient was of high complexity and the patient is at high risk for clinical deterioration, therefore this is a level 3 visit.  Shela Leff MD [facility] Pager [phone]  If 7PM-7AM, please contact night-coverage [email or web address] Password [facility]  05/19/2019, 9:49 AM

## 2019-05-18 NOTE — ED Triage Notes (Signed)
Pt BIB [facility]. Bystander witnessed pt having seizure like activity x32min in the middle of the road. Bystander moved pt to the side of the road. Pt is A&O x1, oriented to name, cannot tell what year it is, what is going on or where he is, only that he is at the hospital. Pt continually stating he has been walking for a long time and that he needs water. Pt follows commands.   Pt was seen here this am for N/V/D and discharged.

## 2019-05-18 NOTE — ED Provider Notes (Signed)
Emergency Department Provider Note   I have reviewed the triage vital signs and the nursing notes.   HISTORY  Chief Complaint Altered Mental Status and Seizures   HPI Donald Jenkins is a 48 y.o. male with PMH of HTN, anxiety, and EtOH abuse presents to the emergency department after having a witnessed seizure.  A bystander witnessed the event and states that he was having seizure-like activity for approximately 3 minutes in the road.  He was confused afterwards and transported by EMS to the hospital.  Patient states that his mom was coming to pick him up but he was walking down the wrong side of the road and she could not find him.  He states he is been walking for most of the day.  Patient tells me that he does drink alcohol regularly but stopped several days ago.  He denies using drugs.  He has never had history of complicated alcohol withdrawal.   Level 5 caveat: AMS/post-ictal.   In talking with mom and reviewing the patient records he was seen in the emergency department earlier today with nausea and vomiting.  No significant mental status change noted at that time.  Mom states that she was coming to pick him up but was delayed in getting here when she was made aware that he was back in the emergency department with possible seizure.  Mom states she was under the impression that he had stopped drinking back in September but the patient states he actually started again.   Past Medical History:  Diagnosis Date  . Anxiety   . Hypertension   . Tachycardia     Patient Active Problem List   Diagnosis Date Noted  . Seizure ([place]) 05/18/2019  . Dissociative amnesia with dissociative fugue ([place]) 03/07/2019  . Acute midline thoracic back pain 01/18/2019  . Hypomagnesemia   . Fall   . Hypoglycemia 12/09/2018  . Bacteremia 12/09/2018  . Acute metabolic encephalopathy [id number]  . Metabolic acidosis [id number]  . Compression fracture of body of thoracic vertebra ([place]) 12/08/2018   . Rhabdomyolysis 12/07/2018  . SIRS (systemic inflammatory response syndrome) ([place]) 12/07/2018  . Back pain 12/07/2018  . Depression with anxiety 12/07/2018  . Abnormal LFTs 12/07/2018  . AKI (acute kidney injury) ([place]) 12/07/2018  . Elevated troponin 12/07/2018  . Hypersomnolence disorder 11/01/2018  . Cholecystitis 06/05/2018  . Recurrent major depression in full remission ([place]) 04/05/2018  . Social anxiety disorder 03/18/2018  . Panic disorder 03/18/2018  . Essential hypertension, benign 06/06/2015    Past Surgical History:  Procedure Laterality Date  . CHOLECYSTECTOMY N/A 06/05/2018   Procedure: LAPAROSCOPIC CHOLECYSTECTOMY WITH POSSIBLE INTRAOPERATIVE CHOLANGIOGRAM;  Surgeon: Clovis Riley, MD;  Location: [facility] OR;  Service: General;  Laterality: N/A;  . IR VERTEBROPLASTY CERV/THOR BX INC UNI/BIL INC/INJECT/IMAGING  12/13/2018  . KNEE SURGERY      Allergies Penicillins  Family History  Problem Relation Age of Onset  . Lung cancer Father   . Esophageal cancer Father   . Brain cancer Father     Social History Social History   Tobacco Use  . Smoking status: Former Research scientist (life sciences)  . Smokeless tobacco: Never Used  Substance Use Topics  . Alcohol use: Yes    Alcohol/week: 0.0 standard drinks    Comment: socially  . Drug use: Yes    Types: Marijuana    Review of Systems  Constitutional: No fever/chills Eyes: No visual changes. ENT: No sore throat. Cardiovascular: Denies chest pain. Respiratory: Denies shortness of breath.  Gastrointestinal: No abdominal pain. Positive nausea and vomiting.  No diarrhea.  No constipation. Genitourinary: Negative for dysuria. Musculoskeletal: Negative for back pain. Skin: Negative for rash. Neurological: Negative for headaches, focal weakness or numbness. Positive seizure like activity.   10-point ROS otherwise negative.  ____________________________________________   PHYSICAL EXAM:  VITAL SIGNS: ED Triage Vitals [05/18/19 2011]   Enc Vitals Group     BP (!) 167/77     Pulse Rate (!) 121     Resp 16     Temp 97.8 F (36.6 C)     Temp src      SpO2 97 %   Constitutional: Alert and oriented to person and place but confused and repetitive.  Eyes: Conjunctivae are normal.  Head: Atraumatic. Nose: No congestion/rhinnorhea. Mouth/Throat: Mucous membranes are moist. No tongue laceration/abrasion.  Neck: No stridor.   Cardiovascular: Tachycardia. Good peripheral circulation. Grossly normal heart sounds.   Respiratory: Normal respiratory effort.  No retractions. Lungs CTAB. Gastrointestinal: Soft and nontender. No distention.  Musculoskeletal: No lower extremity tenderness nor edema. No gross deformities of extremities. Neurologic:  Normal speech and language. No gross focal neurologic deficits are appreciated.  Skin:  Skin is warm, dry and intact. No rash noted. Psych: Patient's thought process is tangential.    ____________________________________________   LABS (all labs ordered are listed, but only abnormal results are displayed)  Labs Reviewed  COMPREHENSIVE METABOLIC PANEL - Abnormal; Notable for the following components:      Result Value   CO2 19 (*)    Glucose, Bld 154 (*)    BUN 36 (*)    Creatinine, Ser 1.47 (*)    GFR calc non Af Amer 56 (*)    Anion gap 19 (*)    All other components within normal limits  ACETAMINOPHEN LEVEL - Abnormal; Notable for the following components:   Acetaminophen (Tylenol), Serum <10 (*)    All other components within normal limits  CBC WITH DIFFERENTIAL/PLATELET - Abnormal; Notable for the following components:   WBC 14.4 (*)    Neutro Abs 12.6 (*)    Abs Immature Granulocytes 0.08 (*)    All other components within normal limits  CK - Abnormal; Notable for the following components:   Total CK 626 (*)    All other components within normal limits  SARS CORONAVIRUS 2 (TAT 6-24 HRS)  ETHANOL  LIPASE, BLOOD  SALICYLATE LEVEL  TSH  RAPID URINE DRUG SCREEN, HOSP  PERFORMED   ____________________________________________  RADIOLOGY  Ct Head Wo Contrast  Result Date: 05/18/2019 CLINICAL DATA:  Seizure EXAM: CT HEAD WITHOUT CONTRAST TECHNIQUE: Contiguous axial images were obtained from the base of the skull through the vertex without intravenous contrast. COMPARISON:  CT brain 03/02/2019 FINDINGS: Brain: No acute territorial infarction, hemorrhage or intracranial mass. The ventricles are nonenlarged. Vascular: No hyperdense vessels.  No unexpected calcification Skull: Normal. Negative for fracture or focal lesion. Sinuses/Orbits: No acute finding. Other: None IMPRESSION: Negative non contrasted CT appearance of the brain Electronically Signed   By: Donavan Foil M.D.   On: 05/18/2019 22:13    ____________________________________________   PROCEDURES  Procedure(s) performed:   Procedures  CRITICAL CARE Performed by: Margette Fast Total critical care time: 35 minutes Critical care time was exclusive of separately billable procedures and treating other patients. Critical care was necessary to treat or prevent imminent or life-threatening deterioration. Critical care was time spent personally by me on the following activities: development of treatment plan with patient and/or surrogate as  well as nursing, discussions with consultants, evaluation of patient's response to treatment, examination of patient, obtaining history from patient or surrogate, ordering and performing treatments and interventions, ordering and review of laboratory studies, ordering and review of radiographic studies, pulse oximetry and re-evaluation of patient's condition.  Nanda Quinton, MD Emergency Medicine  ____________________________________________   INITIAL IMPRESSION / ASSESSMENT AND PLAN / ED COURSE  Pertinent labs & imaging results that were available during my care of the patient were reviewed by me and considered in my medical decision making (see chart for  details).   Patient presents to the emergency department with confusion after having a witnessed seizure.  No history of seizure reported in the medical record that I have reviewed.  Patient denies any seizure history himself.  He does report drinking alcohol regularly and states it has been several days since he last drank.  Question alcohol withdrawal as the patient does have hypertension and tachycardia.  Will give initial Ativan and CIWA along with IV fluids and thiamine.  Plan for CT imaging of the head with the patient's first seizure along with screening lab work.   CT head negative. Vitals improved with Ativan and IVF.  Suspect acute alcohol withdrawal with delirium and possible seizure earlier as the cause of her symptoms.  Patient last drink 3 to 5 days ago which puts in the right timeframe for this.  He is also using many over-the-counter supplements of unknown type which could be contributing.  Urine tox is also pending.  Continue CIWA and Ativan. Plan for obs admit.   Discussed patient's case with [facility] to request admission. Patient and family (if present) updated with plan. Care transferred to [facility] service.  I reviewed all nursing notes, vitals, pertinent old records, EKGs, labs, imaging (as available).  ____________________________________________  FINAL CLINICAL IMPRESSION(S) / ED DIAGNOSES  Final diagnoses:  Seizure-like activity ([place])  Alcohol withdrawal syndrome, with delirium ([place])     MEDICATIONS GIVEN DURING THIS VISIT:  Medications  LORazepam (ATIVAN) injection 0-4 mg (0 mg Intravenous Hold 05/18/19 2115)    Or  LORazepam (ATIVAN) tablet 0-4 mg ( Oral See Alternative 05/18/19 2115)  LORazepam (ATIVAN) injection 0-4 mg (has no administration in time range)    Or  LORazepam (ATIVAN) tablet 0-4 mg (has no administration in time range)  sodium chloride 0.9 % bolus 1,000 mL (0 mLs Intravenous Stopped 05/18/19 2200)  thiamine (B-1) injection 100 mg (100 mg Intravenous  Given 05/18/19 2057)  ondansetron (ZOFRAN) injection 4 mg (4 mg Intravenous Given 05/18/19 2059)  LORazepam (ATIVAN) injection 1 mg (1 mg Intravenous Given 05/18/19 2100)    Note:  This document was prepared using Dragon voice recognition software and may include unintentional dictation errors.  Nanda Quinton, MD, [facility] Emergency Medicine    Long, Wonda Olds, MD 05/19/19 [id number]

## 2019-05-18 NOTE — ED Notes (Signed)
Mother Joellen Jersey [phone] called and updated.

## 2019-05-18 NOTE — ED Notes (Signed)
Pt states more c/o of imsonia x3 days as well as c/o of nausea when he rolls to his L side.

## 2019-05-18 NOTE — ED Triage Notes (Signed)
Per EMS: Pt from work.  Pt c/o of N/V/D x 24 hours.   BP 134/90 HR 115 CBG 164 RR 18

## 2019-05-18 NOTE — Discharge Instructions (Signed)
Home to rest, push hydrating fluids.  Clear liquid diet, advance slowly as tolerated.  Take Zofran as needed as prescribed for nausea and vomiting.  Recheck with your primary care provider.  Return to ER for new or worsening symptoms.

## 2019-05-18 NOTE — ED Provider Notes (Signed)
[place] DEPT Provider Note   CSN: [id number] Arrival date & time: 05/18/19  [id number]     History   Chief Complaint No chief complaint on file.   HPI Donald Jenkins is a 48 y.o. male.     48 year old male with possible history of anxiety, hypertension, tachycardia brought in by EMS from work for nausea and vomiting.  Patient reports having a loose nonbloody stool this morning otherwise no ongoing diarrhea, states he got to work and began having constant vomiting.  Patient states he has insomnia, under the care of a psychiatrist and has tried medications without improvement, states he has not slept for the past 3 days, denies drug or alcohol use. Denies abdominal pain, sick contacts, fevers, chills, changes in bladder habits, no other complaints or concerns.      Past Medical History:  Diagnosis Date  . Anxiety   . Hypertension   . Tachycardia     Patient Active Problem List   Diagnosis Date Noted  . Dissociative amnesia with dissociative fugue ([place]) 03/07/2019  . Acute midline thoracic back pain 01/18/2019  . Hypomagnesemia   . Fall   . Hypoglycemia 12/09/2018  . Bacteremia 12/09/2018  . Acute metabolic encephalopathy [id number]  . Metabolic acidosis [id number]  . Compression fracture of body of thoracic vertebra ([place]) 12/08/2018  . Rhabdomyolysis 12/07/2018  . SIRS (systemic inflammatory response syndrome) ([place]) 12/07/2018  . Back pain 12/07/2018  . Depression with anxiety 12/07/2018  . Abnormal LFTs 12/07/2018  . AKI (acute kidney injury) ([place]) 12/07/2018  . Elevated troponin 12/07/2018  . Hypersomnolence disorder 11/01/2018  . Cholecystitis 06/05/2018  . Recurrent major depression in full remission ([place]) 04/05/2018  . Social anxiety disorder 03/18/2018  . Panic disorder 03/18/2018  . Essential hypertension, benign 06/06/2015    Past Surgical History:  Procedure Laterality Date  . CHOLECYSTECTOMY N/A 06/05/2018   Procedure:  LAPAROSCOPIC CHOLECYSTECTOMY WITH POSSIBLE INTRAOPERATIVE CHOLANGIOGRAM;  Surgeon: Clovis Riley, MD;  Location: [facility] OR;  Service: General;  Laterality: N/A;  . IR VERTEBROPLASTY CERV/THOR BX INC UNI/BIL INC/INJECT/IMAGING  12/13/2018  . KNEE SURGERY          Home Medications    Prior to Admission medications   Medication Sig Start Date End Date Taking? Authorizing Provider  ALPRAZolam Duanne Moron) 1 MG tablet TAKE 1 TABLET BY MOUTH 3 TIMES A DAY AS NEEDED FOR ANXIETY 05/02/19   Delight Hoh, MD  amLODipine (NORVASC) 5 MG tablet TAKE 1 TABLET (5 MG TOTAL) BY MOUTH DAILY. TO HELP LOWER BLOOD PRESSURE 04/12/19   Fulp, Cammie, MD  HYDROcodone-acetaminophen (NORCO) 5-325 MG tablet Take 1 tablet by mouth every 6 (six) hours as needed. 03/20/19   Jacqlyn Larsen, PA-C  levocetirizine (XYZAL) 5 MG tablet Take 1 tablet (5 mg total) by mouth every evening. To help with nasal allergies Patient not taking: Reported on 03/02/2019 01/18/19   Fulp, Ander Gaster, MD  methocarbamol (ROBAXIN) 500 MG tablet Take 1 tablet (500 mg total) by mouth 2 (two) times daily. 03/28/19   Magnant, Charles L, PA-C  methocarbamol (ROBAXIN) 500 MG tablet TAKE 1 TABLET (500 MG TOTAL) BY MOUTH EVERY 8 (EIGHT) HOURS AS NEEDED FOR MUSCLE SPASMS. 05/01/19   Magnant, Charles L, PA-C  montelukast (SINGULAIR) 10 MG tablet TAKE 1 TABLET BY MOUTH EVERYDAY AT BEDTIME 04/12/19   Maximiano Coss, NP  naproxen (NAPROSYN) 500 MG tablet Take 1 tablet (500 mg total) by mouth 2 (two) times daily. 03/20/19   Jacqlyn Larsen,  PA-C  ondansetron (ZOFRAN ODT) 4 MG disintegrating tablet Take 1 tablet (4 mg total) by mouth every 8 (eight) hours as needed for nausea or vomiting. 05/18/19   Tacy Learn, PA-C  QUEtiapine (SEROQUEL) 50 MG tablet TAKE 1 TABLET BY MOUTH EVERYDAY AT BEDTIME 04/11/19   Delight Hoh, MD  traMADol (ULTRAM) 50 MG tablet Take 1 tablet (50 mg total) by mouth every 8 (eight) hours as needed. 04/07/19   Meredith Pel, MD  zaleplon  (SONATA) 10 MG capsule Take 10 mg by mouth at bedtime as needed for sleep.  02/16/19   [provider]  methylphenidate (RITALIN) 5 MG tablet Take 1 tablet (5 mg total) by mouth 3 (three) times daily with meals. Patient not taking: Reported on 12/22/2018 11/16/18 03/03/19  Delight Hoh, MD  mirtazapine (REMERON) 15 MG tablet Take 1 tablet (15 mg total) by mouth at bedtime. Patient not taking: Reported on 03/02/2019 01/18/19 03/03/19  Antony Blackbird, MD    Family History Family History  Problem Relation Age of Onset  . Lung cancer Father   . Esophageal cancer Father   . Brain cancer Father     Social History Social History   Tobacco Use  . Smoking status: Former Research scientist (life sciences)  . Smokeless tobacco: Never Used  Substance Use Topics  . Alcohol use: Yes    Alcohol/week: 0.0 standard drinks    Comment: socially  . Drug use: Yes    Types: Marijuana     Allergies   Penicillins   Review of Systems Review of Systems  Constitutional: Negative for chills, diaphoresis and fever.  Respiratory: Negative for shortness of breath.   Cardiovascular: Negative for chest pain.  Gastrointestinal: Positive for diarrhea, nausea and vomiting. Negative for abdominal pain, blood in stool and constipation.  Genitourinary: Negative for difficulty urinating.  Musculoskeletal: Negative for arthralgias and myalgias.  Skin: Negative for rash and wound.  Allergic/Immunologic: Negative for immunocompromised state.  Neurological: Negative for weakness.  Psychiatric/Behavioral: Positive for sleep disturbance. Negative for confusion.  All other systems reviewed and are negative.    Physical Exam Updated Vital Signs BP (!) 156/88   Pulse (!) 108   Temp 98.9 F (37.2 C) (Oral)   Resp 16   Ht 6\' 1"  (1.854 m)   Wt 83.9 kg   SpO2 100%   BMI 24.41 kg/m   Physical Exam Vitals signs and nursing note reviewed.  Constitutional:      General: He is not in acute distress.    Appearance: He is  well-developed. He is not diaphoretic.  HENT:     Head: Normocephalic and atraumatic.  Cardiovascular:     Rate and Rhythm: Regular rhythm. Tachycardia present.     Pulses: Normal pulses.     Heart sounds: Normal heart sounds.  Pulmonary:     Effort: Pulmonary effort is normal.     Breath sounds: Normal breath sounds.  Abdominal:     Palpations: Abdomen is soft.     Tenderness: There is no abdominal tenderness.  Musculoskeletal:     Right lower leg: No edema.     Left lower leg: No edema.  Skin:    General: Skin is warm and dry.     Findings: No erythema or rash.  Neurological:     Mental Status: He is alert and oriented to person, place, and time.  Psychiatric:        Mood and Affect: Mood is anxious. Mood is not depressed. Affect is not  tearful.      ED Treatments / Results  Labs (all labs ordered are listed, but only abnormal results are displayed) Labs Reviewed  CBC WITH DIFFERENTIAL/PLATELET - Abnormal; Notable for the following components:      Result Value   WBC 16.7 (*)    Neutro Abs 14.9 (*)    All other components within normal limits  URINALYSIS, ROUTINE W REFLEX MICROSCOPIC - Abnormal; Notable for the following components:   Color, Urine AMBER (*)    APPearance CLOUDY (*)    Hgb urine dipstick SMALL (*)    Ketones, ur 20 (*)    Protein, ur 100 (*)    Bacteria, UA FEW (*)    All other components within normal limits  COMPREHENSIVE METABOLIC PANEL - Abnormal; Notable for the following components:   CO2 21 (*)    Glucose, Bld 106 (*)    BUN 28 (*)    Creatinine, Ser 1.35 (*)    Calcium 8.1 (*)    All other components within normal limits  LIPASE, BLOOD    EKG None  Radiology No results found.  Procedures Procedures (including critical care time)  Medications Ordered in ED Medications  sodium chloride 0.9 % bolus 1,000 mL (0 mLs Intravenous Stopped 05/18/19 1440)  ondansetron (ZOFRAN) injection 4 mg (4 mg Intravenous Given 05/18/19 1055)      Initial Impression / Assessment and Plan / ED Course  I have reviewed the triage vital signs and the nursing notes.  Pertinent labs & imaging results that were available during my care of the patient were reviewed by me and considered in my medical decision making (see chart for details).  Clinical Course as of May 17 1608  Thu May 18, 8559  323 48 year old male presents with complaint of nausea and vomiting onset this morning while at work.  On exam abdomen is soft and nontender.  Patient was given Zofran for vomiting and IV fluids.  Review of lab work shows mild leukocytosis with white count of 16.7, question possibly due to vomiting.  Urinalysis with ketones and protein, CMP with bicarb of 21, slight increase in creatinine to 1.35.  Lipase within normal meds.  Case reviewed with Dr. Eulis Foster, patient is tolerating p.o. fluids.  Patient will be discharged and advised to return to ER for any new or worsening symptoms otherwise recheck with PCP.   [LM]    Clinical Course User Index [LM] Tacy Learn, PA-C      Final Clinical Impressions(s) / ED Diagnoses   Final diagnoses:  Non-intractable vomiting with nausea, unspecified vomiting type    ED Discharge Orders         Ordered    ondansetron (ZOFRAN ODT) 4 MG disintegrating tablet  Every 8 hours PRN     05/18/19 1539           Tacy Learn, PA-C 05/18/19 1609    Daleen Bo, MD 05/19/19 1330

## 2019-05-19 ENCOUNTER — Observation Stay (HOSPITAL_COMMUNITY): Payer: BC Managed Care – PPO

## 2019-05-19 ENCOUNTER — Observation Stay (HOSPITAL_BASED_OUTPATIENT_CLINIC_OR_DEPARTMENT_OTHER)
Admit: 2019-05-19 | Discharge: 2019-05-19 | Disposition: A | Discharge status: Home / Self Care | Payer: BC Managed Care – PPO | Attending: Internal Medicine | Admitting: Internal Medicine

## 2019-05-19 ENCOUNTER — Encounter (HOSPITAL_COMMUNITY): Payer: Self-pay | Admitting: *Deleted

## 2019-05-19 DIAGNOSIS — Z7289 Other problems related to lifestyle: Secondary | ICD-10-CM

## 2019-05-19 DIAGNOSIS — R569 Unspecified convulsions: Secondary | ICD-10-CM | POA: Diagnosis not present

## 2019-05-19 LAB — CBC
HCT: 37.9 % — ABNORMAL LOW (ref 39.0–52.0)
Hemoglobin: 12.5 g/dL — ABNORMAL LOW (ref 13.0–17.0)
MCH: 28.3 pg (ref 26.0–34.0)
MCHC: 33 g/dL (ref 30.0–36.0)
MCV: 85.9 fL (ref 80.0–100.0)
Platelets: 226 10*3/uL (ref 150–400)
RBC: 4.41 MIL/uL (ref 4.22–5.81)
RDW: 13.3 % (ref 11.5–15.5)
WBC: 13.2 10*3/uL — ABNORMAL HIGH (ref 4.0–10.5)
nRBC: 0 % (ref 0.0–0.2)

## 2019-05-19 LAB — RAPID URINE DRUG SCREEN, HOSP PERFORMED
Amphetamines: NOT DETECTED
Barbiturates: NOT DETECTED
Benzodiazepines: POSITIVE — AB
Cocaine: NOT DETECTED
Opiates: NOT DETECTED
Tetrahydrocannabinol: POSITIVE — AB

## 2019-05-19 LAB — SARS CORONAVIRUS 2 (TAT 6-24 HRS): SARS Coronavirus 2: NEGATIVE

## 2019-05-19 LAB — BASIC METABOLIC PANEL
Anion gap: 12 (ref 5–15)
BUN: 26 mg/dL — ABNORMAL HIGH (ref 6–20)
CO2: 20 mmol/L — ABNORMAL LOW (ref 22–32)
Calcium: 8.2 mg/dL — ABNORMAL LOW (ref 8.9–10.3)
Chloride: 100 mmol/L (ref 98–111)
Creatinine, Ser: 0.85 mg/dL (ref 0.61–1.24)
GFR calc Af Amer: >60 mL/min (ref >60)
GFR calc non Af Amer: >60 mL/min (ref >60)
Glucose, Bld: 94 mg/dL (ref 70–99)
Potassium: 3.5 mmol/L (ref 3.5–5.1)
Sodium: 132 mmol/L — ABNORMAL LOW (ref 135–145)

## 2019-05-19 LAB — LIPASE, BLOOD: Lipase: 35 U/L (ref 11–51)

## 2019-05-19 LAB — CK: Total CK: 626 U/L — ABNORMAL HIGH (ref 49–397)

## 2019-05-19 MED ORDER — THIAMINE HCL 100 MG PO TABS: 100.0000 mg | ORAL_TABLET | Freq: Every day | ORAL | Status: DC

## 2019-05-19 MED ORDER — ALPRAZOLAM 0.5 MG PO TABS: 0.5000 mg | ORAL_TABLET | Freq: Two times a day (BID) | ORAL | 0 refills | Status: DC | PRN

## 2019-05-19 MED ORDER — PANTOPRAZOLE SODIUM 40 MG IV SOLR
40.0000 mg | Freq: Two times a day (BID) | INTRAVENOUS | Status: DC
  Administered 2019-05-19: 40 mg via INTRAVENOUS
  Filled 2019-05-19: qty 40

## 2019-05-19 MED ORDER — SCOPOLAMINE 1 MG/3DAYS TD PT72
1.0000 | MEDICATED_PATCH | TRANSDERMAL | Status: DC
  Administered 2019-05-19: 1.5 mg via TRANSDERMAL
  Filled 2019-05-19: qty 1

## 2019-05-19 MED ORDER — MAGNESIUM OXIDE 400 (241.3 MG) MG PO TABS
800.0000 mg | ORAL_TABLET | Freq: Every day | ORAL | Status: DC
  Administered 2019-05-19 – 2019-05-20 (×2): 800 mg via ORAL
  Filled 2019-05-19 (×2): qty 2

## 2019-05-19 MED ORDER — MAGNESIUM SULFATE 2 GM/50ML IV SOLN: 2.0000 g | Freq: Once | INTRAVENOUS | Status: DC

## 2019-05-19 MED ORDER — POTASSIUM CHLORIDE IN NACL 40-0.9 MEQ/L-% IV SOLN
INTRAVENOUS | Status: DC
  Administered 2019-05-19 – 2019-05-20 (×2): 125 mL/h via INTRAVENOUS
  Filled 2019-05-19 (×3): qty 1000

## 2019-05-19 MED ORDER — LORAZEPAM 1 MG PO TABS: 1.0000 mg | ORAL_TABLET | ORAL | Status: DC | PRN

## 2019-05-19 MED ORDER — ADULT MULTIVITAMIN W/MINERALS CH
1.0000 | ORAL_TABLET | Freq: Every day | ORAL | Status: DC
  Administered 2019-05-19 – 2019-05-20 (×2): 1 via ORAL
  Filled 2019-05-19 (×2): qty 1

## 2019-05-19 MED ORDER — ACETAMINOPHEN 325 MG PO TABS: 650.0000 mg | ORAL_TABLET | Freq: Four times a day (QID) | ORAL | Status: DC | PRN

## 2019-05-19 MED ORDER — PANTOPRAZOLE SODIUM 40 MG PO TBEC: 40.0000 mg | DELAYED_RELEASE_TABLET | Freq: Every day | ORAL | 1 refills | Status: DC

## 2019-05-19 MED ORDER — POTASSIUM CHLORIDE 10 MEQ/100ML IV SOLN
10.0000 meq | INTRAVENOUS | Status: AC
  Administered 2019-05-19 (×3): 10 meq via INTRAVENOUS
  Filled 2019-05-19 (×3): qty 100

## 2019-05-19 MED ORDER — VITAMIN B-1 100 MG PO TABS
100.0000 mg | ORAL_TABLET | Freq: Every day | ORAL | Status: DC
  Administered 2019-05-19 – 2019-05-20 (×2): 100 mg via ORAL
  Filled 2019-05-19 (×2): qty 1

## 2019-05-19 MED ORDER — ADULT MULTIVITAMIN W/MINERALS CH: 1.0000 | ORAL_TABLET | Freq: Every day | ORAL | Status: DC

## 2019-05-19 MED ORDER — PANTOPRAZOLE SODIUM 40 MG PO TBEC
40.0000 mg | DELAYED_RELEASE_TABLET | Freq: Every day | ORAL | Status: DC
  Administered 2019-05-19: 40 mg via ORAL
  Filled 2019-05-19: qty 1

## 2019-05-19 MED ORDER — ENOXAPARIN SODIUM 40 MG/0.4ML ~~LOC~~ SOLN
40.0000 mg | SUBCUTANEOUS | Status: DC
  Administered 2019-05-19 – 2019-05-20 (×2): 40 mg via SUBCUTANEOUS
  Filled 2019-05-19 (×2): qty 0.4

## 2019-05-19 MED ORDER — ONDANSETRON HCL 4 MG PO TABS: 4.0000 mg | ORAL_TABLET | Freq: Every day | ORAL | 1 refills | Status: DC | PRN

## 2019-05-19 MED ORDER — FOLIC ACID 1 MG PO TABS
1.0000 mg | ORAL_TABLET | Freq: Every day | ORAL | Status: DC
  Administered 2019-05-19 – 2019-05-20 (×2): 1 mg via ORAL
  Filled 2019-05-19 (×2): qty 1

## 2019-05-19 MED ORDER — MAGNESIUM OXIDE 400 (241.3 MG) MG PO TABS: 800.0000 mg | ORAL_TABLET | Freq: Every day | ORAL | Status: DC

## 2019-05-19 MED ORDER — ACETAMINOPHEN 650 MG RE SUPP: 650.0000 mg | Freq: Four times a day (QID) | RECTAL | Status: DC | PRN

## 2019-05-19 MED ORDER — ONDANSETRON HCL 4 MG/2ML IJ SOLN
4.0000 mg | Freq: Four times a day (QID) | INTRAMUSCULAR | Status: DC | PRN
  Administered 2019-05-19: 4 mg via INTRAVENOUS
  Filled 2019-05-19: qty 2

## 2019-05-19 MED ORDER — LORAZEPAM 2 MG/ML IJ SOLN
1.0000 mg | INTRAMUSCULAR | Status: DC | PRN
  Administered 2019-05-19 – 2019-05-20 (×2): 2 mg via INTRAVENOUS
  Filled 2019-05-19 (×2): qty 1

## 2019-05-19 MED ORDER — THIAMINE HCL 100 MG/ML IJ SOLN: 100.0000 mg | Freq: Every day | INTRAMUSCULAR | Status: DC

## 2019-05-19 NOTE — Evaluation (Signed)
Physical Therapy Evaluation Patient Details Name: Donald Jenkins MRN: [id number] DOB: 1971-03-27 Today's Date: 05/19/2019   History of Present Illness  48 yo male with onset of suspected seizure was admitted and tested, negative EEG.  Referred to PT for mobility check and to assess fall risk.  Pt has clear brain MRI, cleared for seizures, negative for Covid.  Has mild leukocytosis, metabolic acidosis, AKI and has EtOH use history.  PMHx:  HTN, anxiety, tachycardia, vertebroplasties, lumbar spine pain,   Clinical Impression  Pt was seen for mobility and strength testing, and noted his ability to walk with only distant S and climb steps with S.  He is getting some balance shifts but are a combination of hyperextension of knees R>L and his posture being in a mild shoulder flexion.  Follow up with all needs as pt's stay allows, but is instructed with his mother present about using the wall to increase shoulder ROM and correct forward shoulder position.  Pt is demonstrating good return on the instruction.    Follow Up Recommendations No PT follow up    Equipment Recommendations  None recommended by PT    Recommendations for Other Services       Precautions / Restrictions Precautions Precautions: Fall;Back Precaution Booklet Issued: No Precaution Comments: monitor for posture, review of precautions Restrictions Weight Bearing Restrictions: No      Mobility  Bed Mobility Overal bed mobility: Modified Independent                Transfers Overall transfer level: Modified independent Equipment used: None             General transfer comment: no LOB with initial standing  Ambulation/Gait Ambulation/Gait assistance: Min guard;Supervision Gait Distance (Feet): 400 Feet Assistive device: None Gait Pattern/deviations: Step-through pattern Gait velocity: controlled Gait velocity interpretation: <1.31 ft/sec, indicative of household ambulator General Gait Details:  occasionally steps onto RLE and has hesitation and list to right, but can recapture balance and no outright falls  Stairs Stairs: Yes Stairs assistance: Supervision Stair Management: One rail Right;Forwards;Alternating pattern Number of Stairs: 14 General stair comments: pt is aware of the issue on RLE and takes his time  Wheelchair Mobility    Modified Rankin (Stroke Patients Only)       Balance Overall balance assessment: History of Falls;Needs assistance Sitting-balance support: Feet supported Sitting balance-Leahy Scale: Good     Standing balance support: No upper extremity supported Standing balance-Leahy Scale: Good Standing balance comment: fair dynamically                             Pertinent Vitals/Pain Pain Assessment: Faces Faces Pain Scale: Hurts a little bit Pain Location: low back Pain Intervention(s): Limited activity within patient's tolerance;Monitored during session;Repositioned;Other (comment)(instructed stretch for posture)    Home Living Family/patient expects to be discharged to:: Private residence Living Arrangements: Alone Available Help at Discharge: Family;Available PRN/intermittently Type of Home: House Home Access: Stairs to enter Entrance Stairs-Rails: Left Entrance Stairs-Number of Steps: flight Home Layout: One level Home Equipment: None Additional Comments: Mother is in town to assist him    Prior Function Level of Independence: Independent               Hand Dominance   Dominant Hand: Right    Extremity/Trunk Assessment   Upper Extremity Assessment Upper Extremity Assessment: Overall WFL for tasks assessed    Lower Extremity Assessment Lower Extremity Assessment: Overall WFL for tasks  assessed    Cervical / Trunk Assessment Cervical / Trunk Assessment: Other exceptions;Kyphotic(had vertebroplasties on lumbar spine mult levels)  Communication   Communication: No difficulties  Cognition  Arousal/Alertness: Awake/alert Behavior During Therapy: WFL for tasks assessed/performed Overall Cognitive Status: Within Functional Limits for tasks assessed                                 General Comments: followed all instructions well after initial confusion by pt about his plan with PT      General Comments General comments (skin integrity, edema, etc.): Pt is able to control balance with gait on hallway and uses railing on stairs to control balance and maintain safety    Exercises Other Exercises Other Exercises: postural correction on supine and standing postures   Assessment/Plan    PT Assessment Patient needs continued PT services  PT Problem List Decreased range of motion;Decreased activity tolerance;Decreased balance;Pain;Other (comment)(postural imbalance)       PT Treatment Interventions DME instruction;Gait training;Stair training;Functional mobility training;Therapeutic activities;Therapeutic exercise;Balance training;Neuromuscular re-education;Patient/family education    PT Goals (Current goals can be found in the Care Plan section)  Acute Rehab PT Goals Patient Stated Goal: none stated PT Goal Formulation: With patient Time For Goal Achievement: 05/26/19 Potential to Achieve Goals: Good    Frequency Min 3X/week   Barriers to discharge Decreased caregiver support home alone    Co-evaluation               AM-PAC PT "6 Clicks" Mobility  Outcome Measure Help needed turning from your back to your side while in a flat bed without using bedrails?: None Help needed moving from lying on your back to sitting on the side of a flat bed without using bedrails?: None Help needed moving to and from a bed to a chair (including a wheelchair)?: None Help needed standing up from a chair using your arms (e.g., wheelchair or bedside chair)?: None Help needed to walk in hospital room?: A Little Help needed climbing 3-5 steps with a railing? : A Little 6  Click Score: 22    End of Session   Activity Tolerance: Patient tolerated treatment well;Patient limited by fatigue Patient left: in bed;with call bell/phone within reach;with bed alarm set;with family/visitor present Nurse Communication: Mobility status PT Visit Diagnosis: Unsteadiness on feet (R26.81);Other (comment)(postural imbalance)    Time: [id number] PT Time Calculation (min) (ACUTE ONLY): 34 min   Charges:   PT Evaluation $PT Eval Moderate Complexity: 1 Mod PT Treatments $Gait Training: 8-22 mins       [name] 05/19/2019, 4:25 PM   Mee Hives, PT MS Acute Rehab Dept. Number: [place] and [place]

## 2019-05-19 NOTE — Procedures (Signed)
Patient Name: Donald Jenkins  MRN: [id number]  Epilepsy Attending: Lora Havens  Referring Physician/Provider: Dr. Shela Leff Date: 05/19/2019 Duration: 22.47 minutes  Patient history: 48 year old male with new onset seizure-like episode.  EEG to evaluate for seizures.  Level of alertness: Awake  AEDs during EEG study: None  Technical aspects: This EEG study was done with scalp electrodes positioned according to the 10-20 International system of electrode placement. Electrical activity was acquired at a sampling rate of 500Hz  and reviewed with a high frequency filter of 70Hz  and a low frequency filter of 1Hz . EEG data were recorded continuously and digitally stored.   Description: The posterior dominant rhythm consists of 9-10 Hz activity of moderate voltage (25-35 uV) seen predominantly in posterior head regions, symmetric and reactive to eye opening and eye closing.  Hyperventilation and photic stimulation were not performed.  IMPRESSION: This study is within normal limits. No seizures or epileptiform discharges were seen throughout the recording.  Sharlene Mccluskey Barbra Sarks

## 2019-05-19 NOTE — Progress Notes (Signed)
EEG complete - results pending 

## 2019-05-19 NOTE — Discharge Summary (Addendum)
Physician Discharge Summary  Donald Jenkins [id number] DOB: 1971/03/18 DOA: 05/18/2019  PCP: Patient, No Pcp Per  Admit date: 05/18/2019 Discharge date: 05/20/19 Admitted From: Home Disposition: Home with mother  Recommendations for Outpatient Follow-up:  1. Follow up with PCP in 1-2 weeks 2. Please obtain BMP/CBC in one week 3. Please follow up at [facility]  Home Health: None Equipment/Devices: None  Discharge Condition: Stable and improved CODE STATUS: Full code  diet recommendation: Cardiac Brief/Interim Summary:48 y.o. male with medical history significant of hypertension, anxiety, history of cholecystectomy presenting to the hospital via EMS for evaluation of seizure-like activity.  Per EMS report, bystanders witnessed patient having seizure-like activity for 3 minutes in the middle of the road.  Upon EMS arrival, patient was AAO x1 and confused.  Patient was seen in the ED earlier today with complaints of nausea and vomiting.  He was given IV fluid hydration and antiemetic and discharged home.  Patient denies history of seizures.  States he was previously drinking alcohol but has not had any alcoholic beverages for the past 1 month.  Denies any recent drug use, states he last used Mariajuana 2 weeks ago.  States he was vomiting yesterday but that has resolved.  He is no longer nauseous.  He is not having any abdominal pain or diarrhea.  No additional history could be obtained from the patient.  ED Course: Tachycardic with heart rate in the low 100s to 120s.  BP slightly elevated. Afebrile.  WBC count 14.4.  Bicarb 19, anion gap 19.  Blood glucose 154.  BUN 36, creatinine 1.4.  Baseline creatinine 0.6.  Lipase and LFTs normal.  UDS pending.  TSH normal.  Blood ethanol level undetectable.  Acetaminophen and salicylate levels undetectable.  CK pending.  Head CT negative for acute intracranial abnormality.  Patient received Ativan, Zofran, thiamine, and 1 L  normal saline bolus.  Discharge Diagnoses:  Principal Problem:   Seizure ([place]) Active Problems:   AKI (acute kidney injury) ([place])   Metabolic acidosis   Alcohol use  #1 alcohol withdrawal seizure patient admitted with seizure witnessed by bystanders on the road.  He was admitted to telemetry floor.  Head CT negative for acute changes.  MRI of the brain negative for acute changes.  EEG with no signs of epileptic activity.  He remained seizure-free during the hospital stay.  Potassium magnesium repleted.  He will be discharged home today with his mother. Patient had multiple episodes of vomiting 11/20.so his dc was cancelled treated with ivf and zofran and protonix.he is tolerating a diet today.  #2 history of alcohol use he reports his last drink was in September.  Counseled against cessation.  Continue multivitamin thiamine folate on discharge.  I have also given him a prescription for Xanax as needed 15 tablets.  #3 AKI resolved with IV fluids.   Estimated body mass index is 24.4 kg/m as calculated from the following:   Height as of this encounter: 6\' 1"  (1.854 m).   Weight as of this encounter: 83.9 kg.  Discharge Instructions  Discharge Instructions    Call MD for:  difficulty breathing, headache or visual disturbances   Complete by: As directed    Call MD for:  persistant nausea and vomiting   Complete by: As directed    Call MD for:  temperature >100.4   Complete by: As directed    Diet - low sodium heart healthy   Complete by: As directed    Increase activity  slowly   Complete by: As directed      Allergies as of 05/19/2019      Reactions   Penicillins Other (See Comments)   Did it involve swelling of the face/tongue/throat, SOB, or low BP? Unknown Did it involve sudden or severe rash/hives, skin peeling, or any reaction on the inside of your mouth or nose? Unknown Did you need to seek medical attention at a hospital or doctor's office? Unknown When did it last  happen?childhood If all above answers are "NO", may proceed with cephalosporin use.      Medication List    STOP taking these medications   amLODipine 5 MG tablet Commonly known as: NORVASC   HYDROcodone-acetaminophen 5-325 MG tablet Commonly known as: Norco   levocetirizine 5 MG tablet Commonly known as: Xyzal   lisinopril-hydrochlorothiazide 10-12.5 MG tablet Commonly known as: ZESTORETIC   methocarbamol 500 MG tablet Commonly known as: ROBAXIN   naproxen 500 MG tablet Commonly known as: NAPROSYN   QUEtiapine 50 MG tablet Commonly known as: SEROQUEL   traMADol 50 MG tablet Commonly known as: ULTRAM     TAKE these medications   ALPRAZolam 0.5 MG tablet Commonly known as: Xanax Take 1 tablet (0.5 mg total) by mouth 2 (two) times daily as needed for up to 10 days for anxiety or sleep. What changed:   medication strength  See the new instructions.   montelukast 10 MG tablet Commonly known as: SINGULAIR TAKE 1 TABLET BY MOUTH EVERYDAY AT BEDTIME What changed: See the new instructions.   multivitamin with minerals Tabs tablet Take 1 tablet by mouth daily. Start taking on: May 20, 2019   ondansetron 4 MG disintegrating tablet Commonly known as: Zofran ODT Take 1 tablet (4 mg total) by mouth every 8 (eight) hours as needed for nausea or vomiting.   pantoprazole 40 MG tablet Commonly known as: PROTONIX Take 1 tablet (40 mg total) by mouth daily.   thiamine 100 MG tablet Take 1 tablet (100 mg total) by mouth daily. Start taking on: May 20, 2019      [facility] Follow up.   Contact information: [place] [phone]         Allergies  Allergen Reactions  . Penicillins Other (See Comments)    Did it involve swelling of the face/tongue/throat, SOB, or low BP? Unknown Did it involve sudden or severe rash/hives, skin peeling, or any  reaction on the inside of your mouth or nose? Unknown Did you need to seek medical attention at a hospital or doctor's office? Unknown When did it last happen?childhood If all above answers are "NO", may proceed with cephalosporin use.     Consultations: Admitting physician spoke to neurology on phone Procedures/Studies: Ct Head Wo Contrast  Result Date: 05/18/2019 CLINICAL DATA:  Seizure EXAM: CT HEAD WITHOUT CONTRAST TECHNIQUE: Contiguous axial images were obtained from the base of the skull through the vertex without intravenous contrast. COMPARISON:  CT brain 03/02/2019 FINDINGS: Brain: No acute territorial infarction, hemorrhage or intracranial mass. The ventricles are nonenlarged. Vascular: No hyperdense vessels.  No unexpected calcification Skull: Normal. Negative for fracture or focal lesion. Sinuses/Orbits: No acute finding. Other: None IMPRESSION: Negative non contrasted CT appearance of the brain Electronically Signed   By: Donavan Foil M.D.   On: 05/18/2019 22:13   Mr Brain Wo Contrast  Result Date: 05/19/2019 CLINICAL DATA:  Encephalopathy.  Seizure like activity. EXAM: MRI  HEAD WITHOUT CONTRAST TECHNIQUE: Multiplanar, multiecho pulse sequences of the brain and surrounding structures were obtained without intravenous contrast. COMPARISON:  Head CT 05/18/2019 and MRI 04/27/2019 FINDINGS: The study is mildly motion degraded. Brain: There is no evidence of acute infarct, intracranial hemorrhage, mass, midline shift, or extra-axial fluid collection. The ventricles and sulci are within normal limits. The brain is normal in signal. Vascular: Major intracranial vascular flow voids are preserved. Skull and upper cervical spine: Unremarkable bone marrow signal. Sinuses/Orbits: Unremarkable orbits. Paranasal sinuses and mastoid air cells are clear. Other: None. IMPRESSION: Negative brain MRI. Electronically Signed   By: Logan Bores M.D.   On: 05/19/2019 09:52    (Echo, Carotid, EGD,  Colonoscopy, ERCP)    Subjective: Awake alert oriented no new complaints lives with his fiance  Discharge Exam: Vitals:   05/19/19 0504 05/19/19 1425  BP: 129/81 139/66  Pulse: (!) 101 (!) 103  Resp: 18 20  Temp: 97.9 F (36.6 C) (!) 97.4 F (36.3 C)  SpO2: 98% 98%   Vitals:   05/19/19 0000 05/19/19 0149 05/19/19 0504 05/19/19 1425  BP: 123/75 (!) 141/76 129/81 139/66  Pulse: (!) 104 95 (!) 101 (!) 103  Resp: 17 16 18 20   Temp:  98.2 F (36.8 C) 97.9 F (36.6 C) (!) 97.4 F (36.3 C)  TempSrc:  Oral Oral Oral  SpO2: 100% 100% 98% 98%  Weight:  83.9 kg    Height:  6\' 1"  (1.854 m)      General: Pt is alert, awake, not in acute distress Cardiovascular: RRR, S1/S2 +, no rubs, no gallops Respiratory: CTA bilaterally, no wheezing, no rhonchi Abdominal: Soft, NT, ND, bowel sounds + Extremities: no edema, no cyanosis    The results of significant diagnostics from this hospitalization (including imaging, microbiology, ancillary and laboratory) are listed below for reference.     Microbiology: Recent Results (from the past 240 hour(s))  SARS CORONAVIRUS 2 (TAT 6-24 HRS) Nasopharyngeal Nasopharyngeal Swab     Status: None   Collection Time: 05/19/19 12:27 AM   Specimen: Nasopharyngeal Swab  Result Value Ref Range Status   SARS Coronavirus 2 NEGATIVE NEGATIVE Final    Comment: (NOTE) SARS-CoV-2 target nucleic acids are NOT DETECTED. The SARS-CoV-2 RNA is generally detectable in upper and lower respiratory specimens during the acute phase of infection. Negative results do not preclude SARS-CoV-2 infection, do not rule out co-infections with other pathogens, and should not be used as the sole basis for treatment or other patient management decisions. Negative results must be combined with clinical observations, patient history, and epidemiological information. The expected result is Negative. Fact Sheet for Patients: SugarRoll.be Fact Sheet  for Healthcare Providers: https://www.woods-.com/ This test is not yet approved or cleared by the [place] FDA and  has been authorized for detection and/or diagnosis of SARS-CoV-2 by FDA under an Emergency Use Authorization (EUA). This EUA will remain  in effect (meaning this test can be used) for the duration of the COVID-19 declaration under Section 56 4(b)(1) of the Act, 21 U.S.C. section 360bbb-3(b)(1), unless the authorization is terminated or revoked sooner. Performed at [facility], [place] [address] [place], [place] [address]      Labs: BNP (last 3 results) No results for input(s): BNP in the last 8760 hours. Basic Metabolic Panel: Recent Labs  Lab 05/18/19 1453 05/18/19 2055 05/19/19 0532  NA 139 137 132*  K 3.7 3.5 3.5  CL 104 99 100  CO2 21* 19* 20*  GLUCOSE 106*  154* 94  BUN 28* 36* 26*  CREATININE 1.35* 1.47* 0.85  CALCIUM 8.1* 9.1 8.2*   Liver Function Tests: Recent Labs  Lab 05/18/19 1453 05/18/19 2055  AST 30 40  ALT 24 28  ALKPHOS 74 85  BILITOT 1.0 0.9  PROT 6.9 7.7  ALBUMIN 4.0 4.6   Recent Labs  Lab 05/18/19 1453 05/18/19 2055  LIPASE 34 35   No results for input(s): AMMONIA in the last 168 hours. CBC: Recent Labs  Lab 05/18/19 1059 05/18/19 2055 05/19/19 0532  WBC 16.7* 14.4* 13.2*  NEUTROABS 14.9* 12.6*  --   HGB 15.4 14.5 12.5*  HCT 44.6 43.2 37.9*  MCV 83.8 84.9 85.9  PLT 277 273 226   Cardiac Enzymes: Recent Labs  Lab 05/18/19 2055  CKTOTAL 626*   BNP: Invalid input(s): POCBNP CBG: No results for input(s): GLUCAP in the last 168 hours. D-Dimer No results for input(s): DDIMER in the last 72 hours. Hgb A1c No results for input(s): HGBA1C in the last 72 hours. Lipid Profile No results for input(s): CHOL, HDL, LDLCALC, TRIG, CHOLHDL, LDLDIRECT in the last 72 hours. Thyroid function studies Recent Labs    05/18/19 2055  TSH 1.098   Anemia work up No results for input(s):  VITAMINB12, FOLATE, FERRITIN, TIBC, IRON, RETICCTPCT in the last 72 hours. Urinalysis    Component Value Date/Time   COLORURINE AMBER (A) 05/18/2019 1059   APPEARANCEUR CLOUDY (A) 05/18/2019 1059   LABSPEC 1.025 05/18/2019 1059   PHURINE 5.0 05/18/2019 1059   GLUCOSEU NEGATIVE 05/18/2019 1059   HGBUR SMALL (A) 05/18/2019 1059   BILIRUBINUR NEGATIVE 05/18/2019 1059   BILIRUBINUR small (A) 06/06/2015 1020   BILIRUBINUR neg 07/23/2014 1058   KETONESUR 20 (A) 05/18/2019 1059   PROTEINUR 100 (A) 05/18/2019 1059   UROBILINOGEN 0.2 06/06/2015 1020   UROBILINOGEN 0.2 02/12/2014 2204   NITRITE NEGATIVE 05/18/2019 1059   LEUKOCYTESUR NEGATIVE 05/18/2019 1059   Sepsis Labs Invalid input(s): PROCALCITONIN,  WBC,  LACTICIDVEN Microbiology Recent Results (from the past 240 hour(s))  SARS CORONAVIRUS 2 (TAT 6-24 HRS) Nasopharyngeal Nasopharyngeal Swab     Status: None   Collection Time: 05/19/19 12:27 AM   Specimen: Nasopharyngeal Swab  Result Value Ref Range Status   SARS Coronavirus 2 NEGATIVE NEGATIVE Final    Comment: (NOTE) SARS-CoV-2 target nucleic acids are NOT DETECTED. The SARS-CoV-2 RNA is generally detectable in upper and lower respiratory specimens during the acute phase of infection. Negative results do not preclude SARS-CoV-2 infection, do not rule out co-infections with other pathogens, and should not be used as the sole basis for treatment or other patient management decisions. Negative results must be combined with clinical observations, patient history, and epidemiological information. The expected result is Negative. Fact Sheet for Patients: SugarRoll.be Fact Sheet for Healthcare Providers: https://www.woods-.com/ This test is not yet approved or cleared by the [place] FDA and  has been authorized for detection and/or diagnosis of SARS-CoV-2 by FDA under an Emergency Use Authorization (EUA). This EUA will remain   in effect (meaning this test can be used) for the duration of the COVID-19 declaration under Section 56 4(b)(1) of the Act, 21 U.S.C. section 360bbb-3(b)(1), unless the authorization is terminated or revoked sooner. Performed at [facility], [place] [address] [place], [place] [address]      Time coordinating discharge:  52 mminutes  SIGNED:   Georgette Shell, MD  [facility] 05/19/2019, 3:28 PM Pager   If 7PM-7AM, please contact night-coverage [email or web address]  Password [email or web address]

## 2019-05-19 NOTE — Progress Notes (Signed)
PROGRESS NOTE    Donald Jenkins  [id number] DOB: March 22, 1971 DOA: 05/18/2019 PCP: Patient, No Pcp Per    Brief Narrative: 48 y.o. male with medical history significant of hypertension, anxiety, history of cholecystectomy presenting to the hospital via EMS for evaluation of seizure-like activity.  Per EMS report, bystanders witnessed patient having seizure-like activity for 3 minutes in the middle of the road.  Upon EMS arrival, patient was AAO x1 and confused.  Patient was seen in the ED earlier today with complaints of nausea and vomiting.  He was given IV fluid hydration and antiemetic and discharged home.  Patient denies history of seizures.  States he was previously drinking alcohol but has not had any alcoholic beverages for the past 1 month.  Denies any recent drug use, states he last used Mariajuana 2 weeks ago.  States he was vomiting yesterday but that has resolved.  He is no longer nauseous.  He is not having any abdominal pain or diarrhea.  No additional history could be obtained from the patient.  ED Course: Tachycardic with heart rate in the low 100s to 120s.  BP slightly elevated. Afebrile.  WBC count 14.4.  Bicarb 19, anion gap 19.  Blood glucose 154.  BUN 36, creatinine 1.4.  Baseline creatinine 0.6.  Lipase and LFTs normal.  UDS pending.  TSH normal.  Blood ethanol level undetectable.  Acetaminophen and salicylate levels undetectable.  CK pending.  Head CT negative for acute intracranial abnormality.  Patient received Ativan, Zofran, thiamine, and 1 L normal saline bolus.  Assessment & Plan:   Principal Problem:   Seizure ([place]) Active Problems:   AKI (acute kidney injury) ([place])   Metabolic acidosis   Alcohol use  #1 witnessed seizure secondary to alcohol withdrawal.  Work-up included head CT MRI of the brain and EEG all within normal limits.  Continue seizure precautions.  Patient has not had a seizure during the hospital stay.  I was planning to discharge patient  today but he started throwing up late afternoon was not able to tolerate any p.o. intake.  So discharge was canceled.  #2 history of alcohol abuse continue CIWA protocol  #3 AKI resolving with IV hydration  #4 intractable nausea vomiting-continue IV fluids clear liquids if he can tolerate check CBC CMP tomorrow check lipase Add Protonix 40 mg twice a day for possible gastritis if he does not improve will consult GI.   Estimated body mass index is 24.4 kg/m as calculated from the following:   Height as of this encounter: 6\' 1"  (1.854 m).   Weight as of this encounter: 83.9 kg.  DVT prophylaxis: Lovenox Code Status: Full code Family Communication: No family available. Disposition Plan: Anticipate discharge after clinical improvement. Consults called: None  Subjective:  Patient threw up multiple times what he ate he cannot tolerate any p.o. intake denies any diarrhea have some abdominal discomfort.  No headaches or changes with his vision. Objective: Vitals:   05/19/19 0000 05/19/19 0149 05/19/19 0504 05/19/19 1425  BP: 123/75 (!) 141/76 129/81 139/66  Pulse: (!) 104 95 (!) 101 (!) 103  Resp: 17 16 18 20   Temp:  98.2 F (36.8 C) 97.9 F (36.6 C) (!) 97.4 F (36.3 C)  TempSrc:  Oral Oral Oral  SpO2: 100% 100% 98% 98%  Weight:  83.9 kg    Height:  6\' 1"  (1.854 m)      Intake/Output Summary (Last 24 hours) at 05/19/2019 1625 Last data filed at 05/18/2019 2200 Gross per  24 hour  Intake 1000 ml  Output --  Net 1000 ml   Filed Weights   05/19/19 0149  Weight: 83.9 kg    Examination:  General exam: Appears calm and comfortable  Respiratory system: Clear to auscultation. Respiratory effort normal. Cardiovascular system: S1 & S2 heard, RRR. No JVD, murmurs, rubs, gallops or clicks. No pedal edema. Gastrointestinal system: Abdomen is nondistended, soft and nontender. No organomegaly or masses felt. Normal bowel sounds heard. Central nervous system: Alert and oriented. No  focal neurological deficits. Extremities: Symmetric 5 x 5 power. Skin: No rashes, lesions or ulcers Psychiatry: Judgement and insight appear normal. Mood & affect appropriate.     Data Reviewed: I have personally reviewed following labs and imaging studies  CBC: Recent Labs  Lab 05/18/19 1059 05/18/19 2055 05/19/19 0532  WBC 16.7* 14.4* 13.2*  NEUTROABS 14.9* 12.6*  --   HGB 15.4 14.5 12.5*  HCT 44.6 43.2 37.9*  MCV 83.8 84.9 85.9  PLT 277 273 [id number]   Basic Metabolic Panel: Recent Labs  Lab 05/18/19 1453 05/18/19 2055 05/19/19 0532  NA 139 137 132*  K 3.7 3.5 3.5  CL 104 99 100  CO2 21* 19* 20*  GLUCOSE 106* 154* 94  BUN 28* 36* 26*  CREATININE 1.35* 1.47* 0.85  CALCIUM 8.1* 9.1 8.2*   GFR: Estimated Creatinine Clearance: 121.4 mL/min (by C-G formula based on SCr of 0.85 mg/dL). Liver Function Tests: Recent Labs  Lab 05/18/19 1453 05/18/19 2055  AST 30 40  ALT 24 28  ALKPHOS 74 85  BILITOT 1.0 0.9  PROT 6.9 7.7  ALBUMIN 4.0 4.6   Recent Labs  Lab 05/18/19 1453 05/18/19 2055  LIPASE 34 35   No results for input(s): AMMONIA in the last 168 hours. Coagulation Profile: No results for input(s): INR, PROTIME in the last 168 hours. Cardiac Enzymes: Recent Labs  Lab 05/18/19 2055  CKTOTAL 626*   BNP (last 3 results) No results for input(s): PROBNP in the last 8760 hours. HbA1C: No results for input(s): HGBA1C in the last 72 hours. CBG: No results for input(s): GLUCAP in the last 168 hours. Lipid Profile: No results for input(s): CHOL, HDL, LDLCALC, TRIG, CHOLHDL, LDLDIRECT in the last 72 hours. Thyroid Function Tests: Recent Labs    05/18/19 2055  TSH 1.098   Anemia Panel: No results for input(s): VITAMINB12, FOLATE, FERRITIN, TIBC, IRON, RETICCTPCT in the last 72 hours. Sepsis Labs: No results for input(s): PROCALCITON, LATICACIDVEN in the last 168 hours.  Recent Results (from the past 240 hour(s))  SARS CORONAVIRUS 2 (TAT 6-24 HRS)  Nasopharyngeal Nasopharyngeal Swab     Status: None   Collection Time: 05/19/19 12:27 AM   Specimen: Nasopharyngeal Swab  Result Value Ref Range Status   SARS Coronavirus 2 NEGATIVE NEGATIVE Final    Comment: (NOTE) SARS-CoV-2 target nucleic acids are NOT DETECTED. The SARS-CoV-2 RNA is generally detectable in upper and lower respiratory specimens during the acute phase of infection. Negative results do not preclude SARS-CoV-2 infection, do not rule out co-infections with other pathogens, and should not be used as the sole basis for treatment or other patient management decisions. Negative results must be combined with clinical observations, patient history, and epidemiological information. The expected result is Negative. Fact Sheet for Patients: SugarRoll.be Fact Sheet for Healthcare Providers: https://www.woods-.com/ This test is not yet approved or cleared by the [place] FDA and  has been authorized for detection and/or diagnosis of SARS-CoV-2 by FDA under an Emergency Use Authorization (  EUA). This EUA will remain  in effect (meaning this test can be used) for the duration of the COVID-19 declaration under Section 56 4(b)(1) of the Act, 21 U.S.C. section 360bbb-3(b)(1), unless the authorization is terminated or revoked sooner. Performed at [facility], [place] [address] [place], [place] [address]          Radiology Studies: Ct Head Wo Contrast  Result Date: 05/18/2019 CLINICAL DATA:  Seizure EXAM: CT HEAD WITHOUT CONTRAST TECHNIQUE: Contiguous axial images were obtained from the base of the skull through the vertex without intravenous contrast. COMPARISON:  CT brain 03/02/2019 FINDINGS: Brain: No acute territorial infarction, hemorrhage or intracranial mass. The ventricles are nonenlarged. Vascular: No hyperdense vessels.  No unexpected calcification Skull: Normal. Negative for fracture or focal lesion.  Sinuses/Orbits: No acute finding. Other: None IMPRESSION: Negative non contrasted CT appearance of the brain Electronically Signed   By: Donavan Foil M.D.   On: 05/18/2019 22:13   Mr Brain Wo Contrast  Result Date: 05/19/2019 CLINICAL DATA:  Encephalopathy.  Seizure like activity. EXAM: MRI HEAD WITHOUT CONTRAST TECHNIQUE: Multiplanar, multiecho pulse sequences of the brain and surrounding structures were obtained without intravenous contrast. COMPARISON:  Head CT 05/18/2019 and MRI 04/27/2019 FINDINGS: The study is mildly motion degraded. Brain: There is no evidence of acute infarct, intracranial hemorrhage, mass, midline shift, or extra-axial fluid collection. The ventricles and sulci are within normal limits. The brain is normal in signal. Vascular: Major intracranial vascular flow voids are preserved. Skull and upper cervical spine: Unremarkable bone marrow signal. Sinuses/Orbits: Unremarkable orbits. Paranasal sinuses and mastoid air cells are clear. Other: None. IMPRESSION: Negative brain MRI. Electronically Signed   By: Logan Bores M.D.   On: 05/19/2019 09:52        Scheduled Meds:  enoxaparin (LOVENOX) injection  40 mg Subcutaneous [id number]   folic acid  1 mg Oral Daily   magnesium oxide  800 mg Oral Daily   multivitamin with minerals  1 tablet Oral Daily   pantoprazole (PROTONIX) IV  40 mg Intravenous Q12H   scopolamine  1 patch Transdermal Q72H   thiamine  100 mg Oral Daily   Or   thiamine  100 mg Intravenous Daily   Continuous Infusions:  0.9 % NaCl with KCl 40 mEq / L     potassium chloride 10 mEq (05/19/19 1615)     LOS: 0 days       Georgette Shell, MD [facility]   If 7PM-7AM, please contact night-coverage [email or web address] Password [facility] 05/19/2019, 4:25 PM

## 2019-05-20 ENCOUNTER — Other Ambulatory Visit: Payer: Self-pay | Admitting: Psychiatry

## 2019-05-20 DIAGNOSIS — F410 Panic disorder [episodic paroxysmal anxiety] without agoraphobia: Secondary | ICD-10-CM

## 2019-05-20 DIAGNOSIS — F441 Dissociative fugue: Secondary | ICD-10-CM

## 2019-05-20 DIAGNOSIS — R569 Unspecified convulsions: Secondary | ICD-10-CM | POA: Diagnosis not present

## 2019-05-20 LAB — COMPREHENSIVE METABOLIC PANEL
ALT: 19 U/L (ref 0–44)
AST: 28 U/L (ref 15–41)
Albumin: 3.6 g/dL (ref 3.5–5.0)
Alkaline Phosphatase: 58 U/L (ref 38–126)
Anion gap: 10 (ref 5–15)
BUN: 14 mg/dL (ref 6–20)
CO2: 24 mmol/L (ref 22–32)
Calcium: 8.4 mg/dL — ABNORMAL LOW (ref 8.9–10.3)
Chloride: 102 mmol/L (ref 98–111)
Creatinine, Ser: 0.68 mg/dL (ref 0.61–1.24)
GFR calc Af Amer: >60 mL/min (ref >60)
GFR calc non Af Amer: >60 mL/min (ref >60)
Glucose, Bld: 88 mg/dL (ref 70–99)
Potassium: 3.8 mmol/L (ref 3.5–5.1)
Sodium: 136 mmol/L (ref 135–145)
Total Bilirubin: 1.2 mg/dL (ref 0.3–1.2)
Total Protein: 6.1 g/dL — ABNORMAL LOW (ref 6.5–8.1)

## 2019-05-20 LAB — CBC
HCT: 33.7 % — ABNORMAL LOW (ref 39.0–52.0)
Hemoglobin: 11.1 g/dL — ABNORMAL LOW (ref 13.0–17.0)
MCH: 28.5 pg (ref 26.0–34.0)
MCHC: 32.9 g/dL (ref 30.0–36.0)
MCV: 86.4 fL (ref 80.0–100.0)
Platelets: 185 10*3/uL (ref 150–400)
RBC: 3.9 MIL/uL — ABNORMAL LOW (ref 4.22–5.81)
RDW: 13.1 % (ref 11.5–15.5)
WBC: 6.7 10*3/uL (ref 4.0–10.5)
nRBC: 0 % (ref 0.0–0.2)

## 2019-05-20 MED ORDER — FAMOTIDINE 20 MG PO TABS: 20.0000 mg | ORAL_TABLET | Freq: Every day | ORAL | 1 refills | Status: DC

## 2019-05-22 NOTE — Telephone Encounter (Signed)
Pharmacy seems to seek auto refill for Seroquel 25 mg when patient last had 50 mg from 03/13/2019 possibly renewed in early November but now discontinued by ED 05/19/2019 when he was treated for possible seizure in the road but also considered related to alcohol though cannabis and UDS with the CD and blood alcohol negative having EEG and neuro referral likely to be managed by PCP with all psychiatric medications otherwise discontinued except the ED prescribed alprazolam 15 tablets possibly for taper.  Therefore we decline the refill requested for Seroquel by [place] patient noncompliant with follow-up here.

## 2019-05-22 NOTE — Telephone Encounter (Signed)
Looks like dose increase to 50 mg, change Rx?

## 2019-05-26 ENCOUNTER — Encounter: Payer: Self-pay | Admitting: Physician Assistant

## 2019-05-29 NOTE — Telephone Encounter (Signed)
MA spoke with patient. He is mychart active and MA walked patient through viewing visits via mychart. Patient is also scheduled for tele visit on 05/30/2019 for HFU. Patient is scheduled for repeat labs on Friday.

## 2019-05-30 ENCOUNTER — Ambulatory Visit: Payer: BC Managed Care – PPO | Attending: Nurse Practitioner | Admitting: Nurse Practitioner

## 2019-05-30 ENCOUNTER — Other Ambulatory Visit: Payer: Self-pay

## 2019-05-30 ENCOUNTER — Encounter: Payer: Self-pay | Admitting: Nurse Practitioner

## 2019-05-30 DIAGNOSIS — I10 Essential (primary) hypertension: Secondary | ICD-10-CM

## 2019-05-30 DIAGNOSIS — Z09 Encounter for follow-up examination after completed treatment for conditions other than malignant neoplasm: Secondary | ICD-10-CM

## 2019-05-30 MED ORDER — AMLODIPINE BESYLATE 5 MG PO TABS: 5.0000 mg | ORAL_TABLET | Freq: Every day | ORAL | 3 refills | Status: DC

## 2019-05-30 NOTE — Progress Notes (Signed)
Virtual Visit via Telephone Note Due to national recommendations of social distancing due to [place] 19, telehealth visit is felt to be most appropriate for this patient at this time.  I discussed the limitations, risks, security and privacy concerns of performing an evaluation and management service by telephone and the availability of in person appointments. I also discussed with the patient that there may be a patient responsible charge related to this service. The patient expressed understanding and agreed to proceed.    I connected with [name] on 05/30/19  at  10:50 AM EST  EDT by telephone and verified that I am speaking with the correct person using two identifiers.   Consent I discussed the limitations, risks, security and privacy concerns of performing an evaluation and management service by telephone and the availability of in person appointments. I also discussed with the patient that there may be a patient responsible charge related to this service. The patient expressed understanding and agreed to proceed.   Location of Patient: Private Residence   Location of Provider: [place] and [place] participating in Telemedicine visit: Geryl Rankins FNP-BC [place]    History of Present Illness: Telemedicine visit for: Hospital Follow up. This is a patient of Dr. Siri Cole who was scheduled for follow up today after being admitted to the hospital with seizure like activity.   PER REVIEW OF ED NOTES:  Patient presented on 11-19 via EMS for evaluation of seizure-like activity. Per EMS report, bystanders witnessed patient having seizure-like activity for 3 minutes in the middle of the road. Upon EMS arrival, patient was AAO x1 and confused. Patient was seen in the ED earlier today with complaints of nausea and vomiting. He was given IV fluid hydration and antiemetic and discharged home. Acetaminophen and salicylate levels  undetectable. Head CT negative for acute changes.  MRI of the brain negative for acute changes.  EEG with no signs of epileptic activity.  He remained seizure-free during the hospital stay.  Potassium magnesium repleted.  He will be discharged home today with his mother. Patient had multiple episodes of vomiting 11/20.so his dc was cancelled treated with ivf and zofran and protonix.he is tolerating a diet today.  #2 history of alcohol use he reports his last drink was in September.  Counseled against cessation.  Continue multivitamin thiamine folate on discharge.  I have also given him a prescription for Xanax as needed 15 tablets.  AKI resolved with IV fluids.  Today during tele health visit He is very upset and irate that he does not know "why this happened to me" and "nobody can tell me anything". I explained to him that his MRI/CT of head, EEG, EKG and labs were essentially negative. He denies any alcohol use or seizure like activity since being discharged from the hospital on 11-21.  He was taken off lisinopril-hctz for AKI. He was also taken off amlodipine 5 mg however I it is unclear why he was taken off this medication. He does have a history of HTN. At this time I will resume his amlodipine 5 mg and have him return for BP recheck and labs.  BP Readings from Last 3 Encounters:  05/20/19 126/75  05/18/19 (!) 156/88  03/20/19 126/72     Past Medical History:  Diagnosis Date  . Anxiety   . Hypertension   . Tachycardia     Past Surgical History:  Procedure Laterality Date  . CHOLECYSTECTOMY N/A 06/05/2018  Procedure: LAPAROSCOPIC CHOLECYSTECTOMY WITH POSSIBLE INTRAOPERATIVE CHOLANGIOGRAM;  Surgeon: Clovis Riley, MD;  Location: [facility] OR;  Service: General;  Laterality: N/A;  . IR VERTEBROPLASTY CERV/THOR BX INC UNI/BIL INC/INJECT/IMAGING  12/13/2018  . KNEE SURGERY      Family History  Problem Relation Age of Onset  . Lung cancer Father   . Esophageal cancer Father   . Brain  cancer Father     Social History   Socioeconomic History  . Marital status: Single    Spouse name: Not on file  . Number of children: 0  . Years of education: Not on file  . Highest education level: Not on file  Occupational History  . Occupation: Arts administrator: [employer]  . Financial resource strain: Not hard at all  . Food insecurity    Worry: Never true    Inability: Never true  . Transportation needs    Medical: No    Non-medical: No  Tobacco Use  . Smoking status: Former Research scientist (life sciences)  . Smokeless tobacco: Never Used  Substance and Sexual Activity  . Alcohol use: Not Currently    Alcohol/week: 0.0 standard drinks    Comment: socially  . Drug use: Not Currently    Types: Marijuana  . Sexual activity: Yes    Partners: Female    Birth control/protection: None  Lifestyle  . Physical activity    Days per week: 3 days    Minutes per session: 40 min  . Stress: To some extent  Relationships  . Social Herbalist on phone: Once a week    Gets together: Once a week    Attends religious service: Never    Active member of club or organization: No    Attends meetings of clubs or organizations: Never    Relationship status: Never married  Other Topics Concern  . 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  Diagnoses and all orders for this visit:  Hospital discharge follow-up  Essential hypertension -     amLODipine (NORVASC) 5 MG tablet; Take 1 tablet (5 mg total) by mouth daily. Continue all antihypertensives as prescribed.  Remember to bring in your blood pressure log with you for your follow up appointment.  DASH/Mediterranean Diets are healthier choices for HTN.     Follow Up Instructions Return in about 2 months (around 07/31/2019)  for PCP for HTN.     I discussed the assessment and treatment plan with the patient. The patient was provided an opportunity to ask questions and all were answered. The patient agreed with the plan and demonstrated an understanding of the instructions.   The patient was advised to call back or seek an in-person evaluation if the symptoms worsen or if the condition fails to improve as anticipated.  I provided 24 minutes of non-face-to-face time during this encounter including median intraservice time, reviewing previous notes, labs, imaging, medications and explaining diagnosis and management.  Gildardo Pounds, FNP-BC

## 2019-05-31 ENCOUNTER — Other Ambulatory Visit: Payer: Self-pay | Admitting: Psychiatry

## 2019-05-31 DIAGNOSIS — F410 Panic disorder [episodic paroxysmal anxiety] without agoraphobia: Secondary | ICD-10-CM

## 2019-05-31 DIAGNOSIS — F4010 Social phobia, unspecified: Secondary | ICD-10-CM

## 2019-05-31 MED ORDER — ALPRAZOLAM 0.5 MG PO TABS: 0.5000 mg | ORAL_TABLET | Freq: Two times a day (BID) | ORAL | 0 refills | Status: DC | PRN

## 2019-05-31 NOTE — Telephone Encounter (Signed)
Pt requesting a refill on his Alprazolam. Fill at the [facility] on [address]. Last appt 9/14 no future appts scheduled.

## 2019-05-31 NOTE — Telephone Encounter (Signed)
Patient was provided Xanax 0.5 mg twice daily as needed on 05/18/2019 by emergency department when he presented there for conversion disorder seizures.  All of his other medications seem to have been discontinued including blood pressure pills such that primary care has started a new blood pressure medication.  Patient requests a refill of Xanax provided the emergency department dose 0.5 mg twice daily as needed to either taper off or to make an appointment and come in as he is down from previous 1 mg 3 times daily as needed telling PCP he is no longer using alcohol.  Xanax 0.5 mg #30 no refill is [facility] prescription was sent 05/18/2019

## 2019-06-02 ENCOUNTER — Other Ambulatory Visit: Payer: BC Managed Care – PPO

## 2019-06-06 ENCOUNTER — Ambulatory Visit: Payer: BC Managed Care – PPO | Admitting: Psychiatry

## 2019-06-09 ENCOUNTER — Telehealth: Payer: Self-pay | Admitting: Family Medicine

## 2019-06-09 ENCOUNTER — Other Ambulatory Visit: Payer: BC Managed Care – PPO

## 2019-06-09 ENCOUNTER — Ambulatory Visit: Payer: BC Managed Care – PPO | Admitting: Pharmacist

## 2019-06-09 NOTE — Telephone Encounter (Signed)
Patient wants a letter stating he was admitted to the hospital for seizure but that he didn't have it a seizure and why he had it.

## 2019-06-09 NOTE — Telephone Encounter (Signed)
Please contact patient and let him know that his hospital note indicates that he had seizure-like activity that was witnessed by others. Please ask him to make an office visit in follow-up of his recent hospital visit. He should also keep any follow-up appointments such as with Neurology.

## 2019-06-09 NOTE — Telephone Encounter (Signed)
LMOM

## 2019-06-14 NOTE — Telephone Encounter (Signed)
LMOM

## 2019-06-15 ENCOUNTER — Ambulatory Visit: Payer: BC Managed Care – PPO

## 2019-06-15 ENCOUNTER — Ambulatory Visit: Payer: BC Managed Care – PPO | Attending: Family Medicine | Admitting: Pharmacist

## 2019-06-15 ENCOUNTER — Other Ambulatory Visit: Payer: Self-pay

## 2019-06-15 ENCOUNTER — Ambulatory Visit (INDEPENDENT_AMBULATORY_CARE_PROVIDER_SITE_OTHER): Payer: BC Managed Care – PPO | Admitting: Psychiatry

## 2019-06-15 ENCOUNTER — Encounter: Payer: Self-pay | Admitting: Psychiatry

## 2019-06-15 VITALS — Ht 73.0 in | Wt 196.0 lb

## 2019-06-15 VITALS — BP 134/76 | HR 80

## 2019-06-15 DIAGNOSIS — F441 Dissociative fugue: Secondary | ICD-10-CM | POA: Diagnosis not present

## 2019-06-15 DIAGNOSIS — I10 Essential (primary) hypertension: Secondary | ICD-10-CM | POA: Diagnosis not present

## 2019-06-15 DIAGNOSIS — F4010 Social phobia, unspecified: Secondary | ICD-10-CM | POA: Diagnosis not present

## 2019-06-15 DIAGNOSIS — F410 Panic disorder [episodic paroxysmal anxiety] without agoraphobia: Secondary | ICD-10-CM | POA: Diagnosis not present

## 2019-06-15 DIAGNOSIS — F3342 Major depressive disorder, recurrent, in full remission: Secondary | ICD-10-CM | POA: Diagnosis not present

## 2019-06-15 DIAGNOSIS — Z09 Encounter for follow-up examination after completed treatment for conditions other than malignant neoplasm: Secondary | ICD-10-CM

## 2019-06-15 MED ORDER — DIAZEPAM 5 MG PO TABS: 5.0000 mg | ORAL_TABLET | Freq: Four times a day (QID) | ORAL | 0 refills | Status: DC | PRN

## 2019-06-15 NOTE — Progress Notes (Signed)
[facility]  Patient ID: Donald Jenkins,  MRN: [id number]  PCP: Antony Blackbird, MD  Date of Evaluation: 06/15/2019 Time spent:20 minutes from 1130 to 1150  Chief Complaint:  Chief Complaint    Anxiety; Panic Attack; Depression; Memory Loss      HISTORY/CURRENT STATUS: Donald Jenkins is seen onsite in office 20 minutes face-to-face individually with consent with epic collateral for psychiatric interview and exam in 59-month evaluation and management of panic anxiety with history of major depression in partial remission and episodic dissociative amnestic and fugue symptoms that confuse hospitalizations, work absences, family relations, and girlfriend relationships.  Donald Jenkins does not explain lack of follow-up here after his promise to mother that he would be compliant with working through behavioral and emotional stability. 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 Donald Jenkins acknowledges that he does not eat and drink water appropriately though his weight is back up last 3 months from 183 pounds to 191 and now 196.  He has had rhabdomyolysis, renal dysfunction, and electrolyte disturbances.  He inquires why people will not explain his health concerns to him, though he worries with illness anxiety that he may develop dementia like his aunt in her 83s or will have more spinal tumors.  He had a normal EEG in November as well as normal CT and MRI of the head.  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  Neurosurgeon is sending  him to the neurologist January 22 and he also sees the PCP again in January restarting his amlodipine for hypertension and Pepcid for GERD today.  He seeks treatment today for his social anxiety which may shut him down at work wondering if there is a neurological disturbance as he chokes up in his speech at times.  He has occasional panic attacks stating he never had treatment that works except Xanax now off Remeron, Seroquel, Lamictal, and having but not using Sonata.  Therefore he is down to 1 medication for his anxiety wanting Xanax again when he shows me the discharge instructions from the hospital that something must be done to minimize Xanax risk.  He accepts and is somewhat hopeful about our review of previous emergency department treatment of panic with Ativan or Valium. He is off  tramadol and Robaxin for his back pain reporting that he wants safety but also wants medication for all these problems.  He is not manic today, psychotic, delirious, or suicidal.  He no longer complains of drowsiness or lack of focus in the day, but he does have significant inability to sleep but will agree to not taking the Sonata.  He has no delirium, suicidality, psychosis, or mania.  Depression This is a recurrentepisodicproblemwhich had been significantly relieved by interventions including for his social anxiety over the last 2 years now with situational dysphoria for life consequences from his other symptoms. Episodic panic is not response prevented when he lacks any available rescue being out of Xanax. All depression onset quality is sudden. The problem occurs intermittently.The problem has provoked and warned onset before recurrence.The ED questioned whether he was  not depressed again as the mechanism of his memory disturbance.Associated symptoms include repressed anger, denial, decreased concentration,fatigue,amnesia,and insomnia. Associated symptoms do not include irritability,decreased interest,  headaches, body aches,or suicidal ideas.The symptoms are aggravated by work stress and social issues.Past treatments include SSRIs - Selective serotonin reuptake inhibitors and other medications.  Individual Medical History/ Review of Systems: Changes? :Yes  weight is back up last 3 months from 183 pounds to 191 and now 196.  He has had rhabdomyolysis, renal dysfunction, and electrolyte disturbances.  He inquires why people will not explain his health concerns to him, though he worries that he may develop dementia like his aunt in her 37s or will have more spinal tumors.  He had a normal EEG in November as well as normal CT and MRI of the head.  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  Neurosurgeon is sending him to the neurologist January 22 and he also sees the PCP again in January estarting his amlodipine for hypertension and Pepcid for GERD.  Allergies: Penicillins  Current Medications:  Current Outpatient Medications:  .  ALPRAZolam (XANAX) 0.5 MG tablet, Take 1 tablet (0.5 mg total) by mouth 2 (two) times daily as needed for anxiety., Disp: 30 tablet, Rfl: 0 .  amLODipine (NORVASC) 5 MG tablet, Take 1 tablet (5 mg total) by mouth daily., Disp: 90 tablet, Rfl: 3 .  famotidine (PEPCID) 20 MG tablet, Take 1 tablet (20 mg total) by mouth daily. (Patient not taking: Reported on 05/30/2019), Disp: 30 tablet, Rfl: 1 .  magnesium oxide (MAG-OX) 400 (241.3 Mg) MG tablet, Take 2 tablets (800 mg total) by mouth daily., Disp:  , Rfl:  .  montelukast (SINGULAIR) 10 MG tablet, TAKE 1 TABLET BY MOUTH EVERYDAY AT BEDTIME (Patient taking differently: Take 10 mg by mouth at bedtime. ), Disp: 30 tablet, Rfl: 0 .  Multiple Vitamin (MULTIVITAMIN WITH MINERALS) TABS tablet, Take 1 tablet by mouth daily., Disp:  , Rfl:  .  ondansetron (ZOFRAN) 4 MG tablet, Take 1 tablet (4 mg total) by mouth daily as needed for nausea or vomiting., Disp: 30 tablet, Rfl: 1 .  pantoprazole (PROTONIX) 40  MG tablet, Take 1 tablet (40 mg total) by mouth daily., Disp: 30 tablet, Rfl: 1 .  thiamine 100 MG tablet, Take 1 tablet (100 mg total) by mouth daily., Disp:  , Rfl:   Medication Side Effects: confusion  Family Medical/ Social History: Changes? No  MENTAL HEALTH EXAM:  Height 6\' 1"  (1.854 m), weight 196 lb (88.9 kg).Body mass index is 25.86 kg/m. Muscle strengths and tone 5/5, postural reflexes and gait 0/0, and AIMS = 0 otherwise deferred for coronavirus shutdown  General Appearance: Casual, Fairly Groomed and Meticulous  Eye Contact:  Good  Speech:  Clear and Coherent, Normal Rate and Talkative  Volume:  Normal  Mood:  Anxious, Dysphoric, Euthymic, Irritable and Entitlement to self-defeating passive-aggressive hysteroid way  Affect:  Non-Congruent, Labile, Full Range and Anxious Inappropriate  Thought Process:  Coherent, Goal Directed, Irrelevant and Descriptions of Associations: Tangential  Orientation:  Full (Time, Place, and Person)  Thought Content: Illogical, Ilusions, Rumination and Tangential   Suicidal Thoughts:  No  Homicidal Thoughts:  No  Memory:  Immediate;   Good Remote;   Good though he reports not remembering  Judgement:  Impaired  Insight:  Lacking  Psychomotor Activity:  Normal, Decreased and Mannerisms  Concentration:  Concentration: Fair and Attention Span: Good  Recall:  Good  Fund of Knowledge:  Good  Language: Fair  Assets:  Desire for Improvement Resilience Talents/Skills  ADL's:  Intact  Cognition: WNL  Prognosis:  Fair    DIAGNOSES:    ICD-10-CM   1. Social anxiety disorder  F40.10   2. Panic disorder  F41.0   3. Recurrent major depression in full remission ([place])  F33.42   4. Dissociative amnesia with dissociative fugue ([place])  F44.1     Receiving Psychotherapy: No    RECOMMENDATIONS: Over 50% of the 20-minute face-to-face time is spent for a total of 10 minutes icounseling and coordination of care. Patient allows psychiatric provision of  medical explanation for symptom process and consequence while he reserves more importance for neurology and PCP follow up in January.  However he is sincere today about intent to return to work though his clinical course in the last 3 months has always been to undo his best opportunity even for simple follow-up appointments.  Nutrition and hydration is straightforward if patient will simply follow the dose of health as mother asserts he can be low she is now departed to return to her residence.  Patient expects within the next day or 2 or return to employment prepares for such today.  We examine his MyChart paperwork expecting some resolution of Xanax risk as I explain tramadol, Xanax, and Robaxin. We establish expectation for change of Xanax to Valium sent in E scription 5 mg every 6 hours as needed #120 with no refill educating on dosing affect matching of Valium to Xanax without the risk of withdrawal or other side effects sent to [facility] on [address] for social and panic anxiety and stress triggering amnestic fugue.  He returns for follow-up in 4 weeks or sooner if needed.  Delight Hoh, MD

## 2019-06-15 NOTE — Patient Instructions (Addendum)
Thank you for coming to see [place] today.   Blood pressure today is good.   Hold off on taking BP medications until we get your labs back.  Limiting salt and caffeine, as well as exercising as able for at least 30 minutes for 5 days out of the week, can also help you lower your blood pressure.  Take your blood pressure at home if you are able. Please write down these numbers and bring them to your visits.  If you have any questions about medications, please call me [phone].  Items addressed at previous hospitalization:  Principal Problem:   Seizure ([place]) - work-up was negative Active Problems:   AKI (acute kidney injury) ([place])   Metabolic acidosis  Donald Jenkins

## 2019-06-15 NOTE — Progress Notes (Signed)
   S: PCP: Dr. Chapman Fitch    Patient arrives in good spirits. Presents to the clinic for hypertension evaluation, counseling, and management.   Patient was referred on 05/30/19 by Zelda.Patient was last seen by Primary Care Provider on 03/09/2019.   Patient reports adherence with medications but is still taking Zestoretic. He is not taking the amlodipine.   Current BP Medications include:  Amlodipine 5 mg daily (not taking); Zestoretic 10-12.5 mg daily  Dietary habits include: limits salt and caffeine  Exercise habits include: does not exercise currently but plans to Family / Social history:  - FHx: no pertinent positives - Tobacco: denies use  - Alcohol: denies use   O:  Vitals:   06/15/19 0857  BP: 134/76  Pulse: 80   Last 3 Office BP readings: BP Readings from Last 3 Encounters:  06/15/19 134/76  05/20/19 126/75  05/18/19 (!) 156/88   BMET    Component Value Date/Time   NA 135 06/15/2019 0903   K 4.2 06/15/2019 0903   CL 96 06/15/2019 0903   CO2 24 06/15/2019 0903   GLUCOSE 85 06/15/2019 0903   GLUCOSE 88 05/20/2019 0526   BUN 10 06/15/2019 0903   CREATININE 0.89 06/15/2019 0903   CREATININE 1.05 01/14/2016 0838   CALCIUM 9.9 06/15/2019 0903   GFRNONAA 102 06/15/2019 0903   GFRNONAA 86 01/14/2016 0838   GFRAA 118 06/15/2019 0903   GFRAA >89 01/14/2016 8182    Renal function: Estimated Creatinine Clearance: 116 mL/min (by C-G formula based on SCr of 0.89 mg/dL).  Clinical ASCVD: No  The 10-year ASCVD risk score Mikey Bussing DC Jr., et al., 2013) is: 1.6%   Values used to calculate the score:     Age: 48 years     Sex: Male     Is Non-Hispanic African American: No     Diabetic: No     Tobacco smoker: No     Systolic Blood Pressure: 993 mmHg     Is BP treated: Yes     HDL Cholesterol: 56 mg/dL     Total Cholesterol: 142 mg/dL   A/P: Hypertension longstanding currently close to goal on current medications. BP Goal = <130/80 mmHg. Patient is adherent to medications.  Pt is taking Zestoretic and labs reveal normal, stable kidney function and electrolytes. I recommend to continue Zestoretic daily and hold the amlodipine until seen by PCP next month. -Continued Zestoretic daily.  -Hold amlodipine until seen by PCP.  -F/u labs ordered - lipase, CBC, CMP14 + EGFR -Counseled on lifestyle modifications for blood pressure control including reduced dietary sodium, increased exercise, adequate sleep  Results reviewed and written information provided.   Total time in face-to-face counseling 15 minutes.   F/U Clinic Visit in Jan.  Benard Halsted, PharmD, [place] [phone]

## 2019-06-16 ENCOUNTER — Telehealth: Payer: Self-pay | Admitting: Pharmacist

## 2019-06-16 LAB — CMP14+EGFR
ALT: 24 IU/L (ref 0–44)
AST: 24 IU/L (ref 0–40)
Albumin/Globulin Ratio: 1.9 (ref 1.2–2.2)
Albumin: 4.9 g/dL (ref 4.0–5.0)
Alkaline Phosphatase: 100 IU/L (ref 39–117)
BUN/Creatinine Ratio: 11 (ref 9–20)
BUN: 10 mg/dL (ref 6–24)
Bilirubin Total: 0.4 mg/dL (ref 0.0–1.2)
CO2: 24 mmol/L (ref 20–29)
Calcium: 9.9 mg/dL (ref 8.7–10.2)
Chloride: 96 mmol/L (ref 96–106)
Creatinine, Ser: 0.89 mg/dL (ref 0.76–1.27)
GFR calc Af Amer: 118 mL/min/{1.73_m2} (ref >59)
GFR calc non Af Amer: 102 mL/min/{1.73_m2} (ref >59)
Globulin, Total: 2.6 g/dL (ref 1.5–4.5)
Glucose: 85 mg/dL (ref 65–99)
Potassium: 4.2 mmol/L (ref 3.5–5.2)
Sodium: 135 mmol/L (ref 134–144)
Total Protein: 7.5 g/dL (ref 6.0–8.5)

## 2019-06-16 LAB — CBC
Hematocrit: 48.7 % (ref 37.5–51.0)
Hemoglobin: 16.2 g/dL (ref 13.0–17.7)
MCH: 28.1 pg (ref 26.6–33.0)
MCHC: 33.3 g/dL (ref 31.5–35.7)
MCV: 85 fL (ref 79–97)
Platelets: 239 10*3/uL (ref 150–450)
RBC: 5.76 x10E6/uL (ref 4.14–5.80)
RDW: 13.6 % (ref 11.6–15.4)
WBC: 6.4 10*3/uL (ref 3.4–10.8)

## 2019-06-16 LAB — LIPASE: Lipase: 47 U/L (ref 13–78)

## 2019-06-16 MED ORDER — LISINOPRIL-HYDROCHLOROTHIAZIDE 10-12.5 MG PO TABS: 1.0000 | ORAL_TABLET | Freq: Every day | ORAL | 1 refills | Status: DC

## 2019-06-16 NOTE — Telephone Encounter (Signed)
  He would need an office visit.  I am not sure that I initially took patient out of work and I would need to discuss with the patient what factors prevented him from working and if these factors have now resolved.  Please asked patient to schedule follow-up visit or forward message to someone to help patient get scheduled with a follow-up office visit or telemedicine visit

## 2019-06-16 NOTE — Telephone Encounter (Signed)
Thanks

## 2019-06-16 NOTE — Telephone Encounter (Signed)
Pt is scheduled for OV 07/13/2019.

## 2019-06-16 NOTE — Telephone Encounter (Signed)
Pt was hospitalized 11/19 - 05/20/19 and was found to have an AKI that resolved with IV fluids. He was asked to hold Zestoretic until OP follow-up.   On 05/30/19 hospital follow-up, amlodipine was started in place of Zestoretic.   On BP follow-up with me yesterday (12/17), pt reported continued use of Zestoretic and reported that he did not start the amlodipine for fear of gingival hyperplasia. Pt's BP was pretty good at 134/76, and we took labs as well. I have reviewed his Lipase, CMP14 + EGFR, and CBC and they are all within normal limits. I called patient today and advised him to continue with Zestoretic only for now until seen on 07/12/18 by PCP given that his BP was close to goal on it only. Additionally, labs were WNL and not contraindicative to use.   Dr Chapman Fitch,    Additionally, pt reports that he needs a letter clearing him to return to work. He reports that his work is requesting it come from his PCP.

## 2019-06-19 NOTE — Telephone Encounter (Signed)
Patient wants a call back about the note for work. Please call [phone]

## 2019-06-20 NOTE — Telephone Encounter (Signed)
I cannot clear him for work this has to come from his PCP.

## 2019-06-20 NOTE — Telephone Encounter (Signed)
Patient called and was informed that he needed a visit before his PCP can write a letter for him to return to work. Patient states that no one took him out of work but needs a letter saying he is clear to return to work and that his appt for 07/13/19 is to far away. Patient requested to speak with [facility]. Please f/u

## 2019-06-20 NOTE — Telephone Encounter (Signed)
If you look at the old messages, patient is wanting a letter stating that he was not admitted to the hospital for a seizure when seizure or seizure like activity is in the discharge summary and I earlier replied that he would need a hospital discharge follow-up appointment and that if his hospital discharge summary is stating that he had a seizure or seizure-like activity then a letter cannot be written saying something else. Please see if patient can be scheduled with someone for a hospital discharge follow-up regarding his return to work. It can be me or someone else. He has had some medical issues earlier this year and I am not sure if these issues contributed to him being back out of work or if it was the recent hospitalization.

## 2019-06-26 NOTE — Telephone Encounter (Signed)
Lmom. Patient have an appt with provider Jul 13, 2019.

## 2019-06-30 DIAGNOSIS — R569 Unspecified convulsions: Secondary | ICD-10-CM

## 2019-06-30 HISTORY — DX: Unspecified convulsions: R56.9

## 2019-07-13 ENCOUNTER — Encounter: Payer: Self-pay | Admitting: Psychiatry

## 2019-07-13 ENCOUNTER — Ambulatory Visit (INDEPENDENT_AMBULATORY_CARE_PROVIDER_SITE_OTHER): Payer: BC Managed Care – PPO | Admitting: Psychiatry

## 2019-07-13 ENCOUNTER — Ambulatory Visit: Payer: BC Managed Care – PPO | Attending: Family Medicine | Admitting: Family Medicine

## 2019-07-13 ENCOUNTER — Encounter: Payer: Self-pay | Admitting: Family Medicine

## 2019-07-13 ENCOUNTER — Other Ambulatory Visit: Payer: Self-pay

## 2019-07-13 VITALS — BP 145/80 | HR 98 | Temp 98.8°F | Ht 73.0 in | Wt 209.0 lb

## 2019-07-13 VITALS — Ht 73.0 in | Wt 209.0 lb

## 2019-07-13 DIAGNOSIS — R569 Unspecified convulsions: Secondary | ICD-10-CM

## 2019-07-13 DIAGNOSIS — M545 Low back pain, unspecified: Secondary | ICD-10-CM

## 2019-07-13 DIAGNOSIS — R413 Other amnesia: Secondary | ICD-10-CM | POA: Diagnosis not present

## 2019-07-13 DIAGNOSIS — F3341 Major depressive disorder, recurrent, in partial remission: Secondary | ICD-10-CM | POA: Diagnosis not present

## 2019-07-13 DIAGNOSIS — F410 Panic disorder [episodic paroxysmal anxiety] without agoraphobia: Secondary | ICD-10-CM

## 2019-07-13 DIAGNOSIS — R112 Nausea with vomiting, unspecified: Secondary | ICD-10-CM

## 2019-07-13 DIAGNOSIS — F441 Dissociative fugue: Secondary | ICD-10-CM

## 2019-07-13 DIAGNOSIS — F4010 Social phobia, unspecified: Secondary | ICD-10-CM

## 2019-07-13 DIAGNOSIS — I10 Essential (primary) hypertension: Secondary | ICD-10-CM | POA: Diagnosis not present

## 2019-07-13 DIAGNOSIS — G8929 Other chronic pain: Secondary | ICD-10-CM

## 2019-07-13 LAB — GLUCOSE, POCT (MANUAL RESULT ENTRY): POC Glucose: 117 mg/dL — AB (ref 70–99)

## 2019-07-13 MED ORDER — ALPRAZOLAM 1 MG PO TABS: 1.0000 mg | ORAL_TABLET | Freq: Three times a day (TID) | ORAL | 0 refills | Status: DC | PRN

## 2019-07-13 MED ORDER — PANTOPRAZOLE SODIUM 40 MG PO TBEC: 40.0000 mg | DELAYED_RELEASE_TABLET | Freq: Every day | ORAL | 1 refills | Status: DC

## 2019-07-13 MED ORDER — AMLODIPINE BESYLATE 2.5 MG PO TABS: 2.5000 mg | ORAL_TABLET | Freq: Every day | ORAL | 3 refills | Status: DC

## 2019-07-13 NOTE — Progress Notes (Signed)
Established Patient Office Visit  Subjective:  Patient ID: Donald Jenkins, male    DOB: 03/01/1971  Age: 49 y.o. MRN: [id number]  CC:  Chief Complaint  Patient presents with  . Hospital follow up discussion    HPI Donald Jenkins, 49 yo male , who presents in follow-up of hospitalization from 05/18/2019-05/20/2019 at [facility]. He has had hospital follow-up with Z. Raul Del, NP on 05/30/2019. Patient reports that he still does not know why he was hospitalized. He states that he was in the hospital for about 3 days and they would not let him get out of bed during that time. He feels that no one has explained what is going on with him. He states that on the day of his hospitalization he had been with a friend but when he was ready to home his friend was not and he decided to walk home. The day was very hot and he thinks that perhaps he became light-headed after walking for awhile because he thinks he got slightly lost and walked further than he intended to.         He reports that he really does not know why he was hospitalized and he really does not believe that he had a seizure.  He reports that he has been out of work for several months since the initial hospitalization at which time he was diagnosed with rhabdomyolysis and a vertebral compression fracture.  He would like to have a note to be released to return to work.  He works in the [employer] in [employer].  The work does involve lifting and carrying but he feels that the back pain that he was having as a result of his vertebral fracture is now tolerable.  He is no longer taking tramadol for pain.  He does take over-the-counter pain medication as needed.  He also continues to have some lower back pain which does not currently radiate down either leg and ranges from between a 4-6 on a 0-to-10 scale.        He reports that he did not take his blood pressure medication today.  He reports that when he is taking his medication,  his blood pressures are controlled.  He reports that when he was speaking with the CMA earlier, he was slightly upset because he felt that no one had really explained why he had recently been hospitalized and he believes that this contributed to his blood pressure being elevated.  He denies any current headaches or dizziness related to his blood pressure but he does have occasional, dull headaches but these have not occurred recently.          He has had issues with anxiety and he reports that he has a follow-up appointment with his psychiatrist after today's visit.  He feels that his anxiety issues have improved.  He also feels that if he can return to work that he will feel less anxious this he states that he feels some financial pressure from being out of work.  He denies any suicidal thoughts or ideations.  Past Medical History:  Diagnosis Date  . Anxiety   . Hypertension   . Tachycardia     Past Surgical History:  Procedure Laterality Date  . CHOLECYSTECTOMY N/A 06/05/2018   Procedure: LAPAROSCOPIC CHOLECYSTECTOMY WITH POSSIBLE INTRAOPERATIVE CHOLANGIOGRAM;  Surgeon: Clovis Riley, MD;  Location: [facility] OR;  Service: General;  Laterality: N/A;  . IR VERTEBROPLASTY CERV/THOR BX INC UNI/BIL INC/INJECT/IMAGING  12/13/2018  .  KNEE SURGERY      Family History  Problem Relation Age of Onset  . Lung cancer Father   . Esophageal cancer Father   . Brain cancer Father     Social History   Socioeconomic History  . Marital status: Single    Spouse name: Not on file  . Number of children: 0  . Years of education: Not on file  . Highest education level: Not on file  Occupational History  . Occupation: Arts administrator: [employer]  Tobacco Use  . Smoking status: Former Research scientist (life sciences)  . Smokeless tobacco: Never Used  Substance and Sexual Activity  . Alcohol use: Not Currently    Alcohol/week: 0.0 standard drinks    Comment: socially  . Drug use: Yes    Types: Marijuana  . Sexual  activity: Yes    Partners: Female    Birth control/protection: None  Other Topics Concern  . 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 Difficulty of Paying Living Expenses: Not hard at all  Food Insecurity: No Food Insecurity  . Worried About Charity fundraiser in the Last Year: Never true  . Ran Out of Food in the Last Year: Never true  Transportation Needs: No Transportation Needs  . Lack of Transportation (Medical): No  . Lack of Transportation (Non-Medical): No  Physical Activity: Insufficiently Active  . Days of Exercise per Week: 3 days  . Minutes of Exercise per Session: 40 min  Stress: Stress Concern Present  . Feeling of Stress : To some extent  Social Connections: Severely Isolated  . Frequency of Communication with Friends and Family: Once a week  . Frequency of Social Gatherings with Friends and Family: Once a week  . Attends Religious Services: Never  . Active Member of Clubs or Organizations: No  . Attends Archivist Meetings: Never  . Marital Status: Never married  Intimate Partner Violence: Not At Risk  . Fear of Current or Ex-Partner: No  . Emotionally Abused: No  . Physically Abused: No  . Sexually Abused: No    Outpatient Medications Prior to Visit  Medication Sig Dispense Refill  . diazepam (VALIUM) 5 MG tablet Take 1 tablet (5 mg total) by mouth every 6 (six) hours as needed for anxiety. 120 tablet 0  . lisinopril-hydrochlorothiazide (ZESTORETIC) 10-12.5 MG tablet Take 1 tablet by mouth daily. 90 tablet 1  . magnesium oxide (MAG-OX) 400 (241.3 Mg) MG tablet Take 2 tablets (800 mg total) by mouth daily.    . montelukast (SINGULAIR) 10 MG tablet TAKE 1 TABLET BY MOUTH EVERYDAY AT BEDTIME (Patient taking differently: Take 10 mg by mouth at bedtime. ) 30 tablet 0  . Multiple Vitamin (MULTIVITAMIN WITH MINERALS) TABS  tablet Take 1 tablet by mouth daily.    . ondansetron (ZOFRAN) 4 MG tablet Take 1 tablet (4 mg total) by mouth daily as needed for nausea or vomiting. 30 tablet 1  . pantoprazole (PROTONIX) 40 MG tablet Take 1 tablet (40 mg total) by mouth daily. 30 tablet 1  . thiamine 100 MG tablet Take 1 tablet (100 mg total) by mouth daily.    . famotidine (PEPCID) 20 MG tablet Take 1 tablet (20 mg total) by mouth daily. (Patient not taking: Reported on 05/30/2019) 30 tablet 1   No facility-administered medications prior to visit.  Allergies  Allergen Reactions  . Penicillins Other (See Comments)    Did it involve swelling of the face/tongue/throat, SOB, or low BP? Unknown Did it involve sudden or severe rash/hives, skin peeling, or any reaction on the inside of your mouth or nose? Unknown Did you need to seek medical attention at a hospital or doctor's office? Unknown When did it last happen?childhood If all above answers are "NO", may proceed with cephalosporin use.     ROS Review of Systems  Constitutional: Positive for fatigue. Negative for chills and fever.  HENT: Negative for sore throat and trouble swallowing.   Eyes: Negative for photophobia and visual disturbance.  Respiratory: Negative for cough and shortness of breath.   Cardiovascular: Negative for chest pain and palpitations.  Gastrointestinal: Positive for nausea and vomiting. Negative for abdominal pain, blood in stool, constipation and diarrhea.  Endocrine: Negative for polydipsia, polyphagia and polyuria.  Genitourinary: Negative for dysuria and frequency.  Musculoskeletal: Positive for arthralgias and back pain.  Neurological: Positive for headaches (occasional). Negative for dizziness.  Hematological: Negative for adenopathy. Does not bruise/bleed easily.  Psychiatric/Behavioral: Negative for self-injury and suicidal ideas. The patient is nervous/anxious.       Objective:    Physical Exam  Constitutional: He is  oriented to person, place, and time. He appears well-developed and well-nourished.  Well-nourished well-developed male in no acute distress wearing mask as per office COVID-19 protocol  Eyes: Conjunctivae and EOM are normal.  Neck: No JVD present. No thyromegaly present.  Cardiovascular: Normal rate and regular rhythm.  Pulmonary/Chest: Effort normal and breath sounds normal.  Abdominal: Soft. There is no abdominal tenderness. There is no rebound and no guarding.  Musculoskeletal:        General: Tenderness present. No edema.     Cervical back: Normal range of motion.     Comments: No CVA tenderness.  Patient with mild lumbosacral discomfort to palpation and negative seated leg raise.  Mild bilateral lumbosacral paraspinous spasm  Lymphadenopathy:    He has no cervical adenopathy.  Neurological: He is alert and oriented to person, place, and time. No cranial nerve deficit.  Skin: Skin is warm and dry.  Psychiatric: He has a normal mood and affect. His behavior is normal.  He has calm but somewhat flattened affect.  Behavior appears to be within normal.  He does tend to give alternate explanations related to recent hospitalization as opposed to medical records from hospital  Nursing note and vitals reviewed.   BP (!) 145/80 (BP Location: Left Arm, Patient Position: Sitting, Cuff Size: Normal)   Pulse 98   Temp 98.8 F (37.1 C) (Oral)   Ht '6\' 1"'  (1.854 m)   Wt 209 lb (94.8 kg)   SpO2 99%   BMI 27.57 kg/m  Wt Readings from Last 3 Encounters:  07/13/19 209 lb (94.8 kg)  05/19/19 184 lb 15.5 oz (83.9 kg)  05/18/19 185 lb (83.9 kg)     Health Maintenance Due  Topic Date Due  . TETANUS/TDAP  11/08/1990  . INFLUENZA VACCINE  01/28/2019     Lab Results  Component Value Date   TSH 1.098 05/18/2019   Lab Results  Component Value Date   WBC 6.4 06/15/2019   HGB 16.2 06/15/2019   HCT 48.7 06/15/2019   MCV 85 06/15/2019   PLT 239 06/15/2019   Lab Results  Component Value  Date   NA 135 06/15/2019   K 4.2 06/15/2019   CO2 24 06/15/2019   GLUCOSE 85  06/15/2019   BUN 10 06/15/2019   CREATININE 0.89 06/15/2019   BILITOT 0.4 06/15/2019   ALKPHOS 100 06/15/2019   AST 24 06/15/2019   ALT 24 06/15/2019   PROT 7.5 06/15/2019   ALBUMIN 4.9 06/15/2019   CALCIUM 9.9 06/15/2019   ANIONGAP 10 05/20/2019   Lab Results  Component Value Date   CHOL 142 12/08/2018   Lab Results  Component Value Date   HDL 56 12/08/2018   Lab Results  Component Value Date   LDLCALC 64 12/08/2018   Lab Results  Component Value Date   TRIG 112 12/08/2018   Lab Results  Component Value Date   CHOLHDL 2.5 12/08/2018   Lab Results  Component Value Date   HGBA1C 4.8 12/08/2018      Assessment & Plan:  1. Seizure-like activity ([place]) Patient's hospitalization records from his admission from 05/18/2019 through 05/20/2019 reviewed and discussed with the patient at today's visit.  Patient gives a different account of events on the day of hospital discharge versus EMS report of events per hospital records.  EMS records indicate that patient did have witnessed seizure activity.  He did have a EEG on 05/19/2019 during hospitalization which was normal.  Per ED records, patient with suspected alcohol withdrawal as the cause of his seizure-like activity.  Patient denies any recurrent use of alcohol or excessive alcohol use.  He does have history of prior hospitalization with thoracic vertebral fracture and rhabdomyolysis after being found on the floor of his home and he does not recall what occurred.  He will be referred to neurology for further evaluation and treatment to try and determine the cause of his episodes of seizure-like activity/unresponsiveness.  Discussed with the patient that because he has suspected seizure disorder that he is not allowed to drive at this time and should discuss with neurology length of time that he cannot drive or operate machinery. - Ambulatory referral to  Neurology  2. Essential hypertension At today's visit, patient's blood pressure was initially elevated and both patient and CMA believe that elevated blood pressure may have been related to patient becoming "worked up" about his concerns about his recent hospitalization and whether or not he had a seizure at that time.  Blood pressure was rechecked later when patient was more calm and relaxed and blood pressure was closer to normal.  Initial blood pressure was 176/112 and repeat blood pressure was 145/80.  On review of chart, patient recently met with the clinical pharmacist and at that time was taking lisinopril-hydrochlorothiazide 10-12 0.5.  He reported at today's visit that he is not taking this medication.  He has been asked to restart amlodipine at 2.5 mg and to again follow-up with clinical pharmacist for blood pressure recheck.  He may actually benefit from being on both lisinopril-hydrochlorothiazide as well as amlodipine due to the elevation in his blood pressure when he is upset. - amLODipine (NORVASC) 2.5 MG tablet; Take 1 tablet (2.5 mg total) by mouth daily. To lower blood pressure  Dispense: 30 tablet; Refill: 3 - Amb Referral to Clinical Pharmacist  3. Memory loss He reports continued issues with memory loss.  He will be referred to neurology for further evaluation of his memory loss however he has also been diagnosed with a dissociative disorder (Dissociative amnesia with dissociative fugue) by psychiatry which is likely contributing to his issues with recall. - Ambulatory referral to Neurology  4. Chronic midline low back pain, unspecified whether sciatica present He reports continued issues with midline  low back pain and has had abnormal imaging showing possible spinal cord tumor for which she is seen neurosurgery and no intervention is needed at this time due to the small size of the tumor.  He is not having any current issues such as bowel or bladder dysfunction.  He is to continue  follow-up as per neurosurgery recommendations and may take over-the-counter medications as needed for pain but should avoid the use of nonsteroidal anti-inflammatories at this time as he also has complaint of nausea and vomiting.  5. Non-intractable vomiting with nausea, unspecified vomiting type Patient reports that he has had some recurrent issues with nausea and vomiting including on the day of recent hospital admission.  He is being provided with prescription for pantoprazole to decrease stomach acid and he reports that he currently has some Zofran at home to take as needed for nausea and vomiting.  He is encouraged to remain well-hydrated and to avoid any foods that tend to trigger his nausea. - pantoprazole (PROTONIX) 40 MG tablet; Take 1 tablet (40 mg total) by mouth daily.  Dispense: 90 tablet; Refill: 1 - Glucose (CBG)   An After Visit Summary was printed and given to the patient.  Follow-up: Return in about 8 weeks (around 09/07/2019) for chronic issues.    Antony Blackbird, MD

## 2019-07-13 NOTE — Progress Notes (Signed)
Pt. Is here wanting to know why he was in the hospital and what was the cause of it.   Pt. Is here wanting a work release note from his PCP.

## 2019-07-13 NOTE — Progress Notes (Signed)
[facility] Check  Patient ID: JACOBEE HEYMAN,  MRN: [id number]  PCP: Antony Blackbird, MD  Date of Evaluation: 07/13/2019 Time spent:20 minutes from 1320 to 1340  Chief Complaint:   HISTORY/CURRENT STATUS: [name] is seen onsite in office 20 minutes face-to-face individually with consent with epic collateral for psychiatric interview and exam in 4-week evaluation and management of social and panic anxiety disorders, dissociative disorder amnesia and fugue, and history of major depression in partial remission.  At last appointment, the patient was self-defeating in the attempt to make sense of his rather extraordinary somatoform experiences rarely associated with alcohol but more often from nutritional deviations and activity deviations such as sleeping in prolonged awkward postures or being under nourished and underhydrated.  He had lost weight to 170 pounds and his weight in this office was more often around 200 pounds often finding potassium low and having the rhabdomyolysis which was historically possibly positional when he was having back pain.  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  Patient seemed to expect Dr. Chapman Fitch today to explain all of the symptoms he will not explain or resolve and send him back to work with a solution while she requires him to see the neurologist after normal EEG clearance before returning to work February 1 after having been out of work since November 19 when he was apparently taken to the hospital thinking he may have had a heart attack because he was vomiting at work.  He reports gaining 13 pounds when mother stated a week cooking for him.  He did not get to go to [place] to see the ex-girlfriend in December as he would not allow him  to come if he was still having medical symptoms.  He suggests that his mid November sickness followed the complexity of the interaction with the neurosurgeon.  He is getting no relief from Valium the last month for social anxiety or panic, when Xanax had worked for him since 2015 when care started with Dr. Candis Schatz though when he used it in conjunction with antidepressant and antiepileptic medication.  The patient understands the mandates for appropriate nutrition and activity.  He had 2 beers when he watched a football game with friends but otherwise states he has not been out or had social or work activity using alcohol.  The Valium provides little or no relief of the social anxiety, as we process access of the past as he never had these dissociative experiences until he significantly stopped all of his medications becoming more socially successful and then losing social relationships.  He has no mania, suicidality, psychosis or delirium today.  He manifests no dissociation until the end of the session when return to work and potential restoration of relationship with girlfriend are clarified to require his responsible health maintenance working through his projection that others will care for his health problems even if they do not primarily socialize with him.  Depression This is a recurrentepisodicproblemwhich hadbeen significantly relieved by interventions including forhis social anxiety over the last 2 yearsnow with situational dysphoria for life consequences from his other symptoms.Episodic panic is not response prevented when he lacks any available rescue being out of Xanax. All depression onset quality is sudden. The problem occurs intermittently.The problem has provoked with  warning onsetbeforerecurrence.The ED questioned whether he was not depressed again as the mechanism of his memory disturbance.Associated symptoms include repressed anger, denial, decreased  concentration,amnesia,and insomnia. Associated symptoms do not include irritability, fatigue, decreased interest, headaches, body aches,or suicidal ideas.The symptoms are aggravated by work stress and social issues.Past treatments include SSRIs - Selective serotonin reuptake inhibitors and other medications such as antiepileptics and anxiolytics.  Individual Medical History/ Review of Systems: Changes? Linus Orn concludes in conceptualization that he is doing well per Dr. Chapman Fitch other than blood pressure being up to see neurology next week for final clearance for return to work.  Dr. Siri Cole notes seem to focus on seizure-like activity when the psychiatric common themes are fear of his back pain with conus tumor to which dissociative compensations add vulnerability to multiple other insults.  Allergies: Penicillins  Current Medications:  Current Outpatient Medications:  .  amLODipine (NORVASC) 2.5 MG tablet, Take 1 tablet (2.5 mg total) by mouth daily. To lower blood pressure, Disp: 30 tablet, Rfl: 3 .  famotidine (PEPCID) 20 MG tablet, Take 1 tablet (20 mg total) by mouth daily. (Patient not taking: Reported on 05/30/2019), Disp: 30 tablet, Rfl: 1 .  lisinopril-hydrochlorothiazide (ZESTORETIC) 10-12.5 MG tablet, Take 1 tablet by mouth daily., Disp: 90 tablet, Rfl: 1 .  magnesium oxide (MAG-OX) 400 (241.3 Mg) MG tablet, Take 2 tablets (800 mg total) by mouth daily., Disp:  , Rfl:  .  montelukast (SINGULAIR) 10 MG tablet, TAKE 1 TABLET BY MOUTH EVERYDAY AT BEDTIME (Patient taking differently: Take 10 mg by mouth at bedtime. ), Disp: 30 tablet, Rfl: 0 .  Multiple Vitamin (MULTIVITAMIN WITH MINERALS) TABS tablet, Take 1 tablet by mouth daily., Disp:  , Rfl:  .  ondansetron (ZOFRAN) 4 MG tablet, Take 1 tablet (4 mg total) by mouth daily as needed for nausea or vomiting., Disp: 30 tablet, Rfl: 1 .  pantoprazole (PROTONIX) 40 MG tablet, Take 1 tablet (40 mg total) by mouth daily., Disp: 90 tablet,  Rfl: 1 .  thiamine 100 MG tablet, Take 1 tablet (100 mg total) by mouth daily., Disp:  , Rfl:   Medication Side Effects: dizziness/lightheadedness and myalgias  Family Medical/ Social History: Changes? No  MENTAL HEALTH EXAM:  Height 6\' 1"  (1.854 m), weight 209 lb (94.8 kg).Body mass index is 27.57 kg/m. Muscle strengths and tone 5/5, postural reflexes and gait 0/0, and AIMS = 0 otherwise deferred for coronavirus shutdown  General Appearance: Casual, Fairly Groomed and Guarded  Eye Contact:  Good to fair  Speech:  Clear and Coherent and Normal Rate  Volume:  Normal  Mood:  Anxious, Dysphoric, Euphoric and Irritable  Affect:  Congruent, Depressed, Labile, Restricted and Anxious  Thought Process:  Coherent, Irrelevant, Linear and Descriptions of Associations: Tangential  Orientation:  Full (Time, Place, and Person)  Thought Content: Ilusions, Obsessions, Paranoid Ideation, Rumination and Tangential   Suicidal Thoughts:  No  Homicidal Thoughts:  No  Memory:  Immediate;   Good Remote;   Good until end of session then cloudy cannot remember  Judgement:  Fair  Insight:  Fair  Psychomotor Activity:  Normal, Increased, Mannerisms and Restlessness  Concentration:  Concentration: Fair and Attention Span: Good  Recall:  [place] of Knowledge: Good  Language: Good  Assets:  Leisure Time Resilience Talents/Skills  ADL's:  Intact  Cognition: WNL  Prognosis:  Fair    DIAGNOSES:    ICD-10-CM   1. Social anxiety disorder  F40.10   2. Panic disorder  F41.0  3. Dissociative amnesia with dissociative fugue ([place])  F44.1   4. Recurrent major depression in partial remission ([facility])  F33.41     Receiving Psychotherapy: No    RECOMMENDATIONS: The patient requires themes around which to organize recovery in counter pose to worry for spinal cord cancer that might interrupt his recovery of a social life after years of social deficits.  The patient's regressive demand for help from others  whether mother, girlfriend, or medical and work professionals must be resolved by managing his own health needs as he currently declines to return to former medication such as Lamictal and Remeron.  He expects Xanax in place of Valium to restore his former social competence and overcoming panic.  He is correct in recalling his success in the recent past compared to 3 to 5 years ago when he had no social life self-defeating then any date or social activity now doing the same in his dissociation.  Spinal cord tumor on MRI occurred 2 weeks before he became sick at work 05/18/2019.  Change over to Xanax is appropriate if he does not incorporate the use of Xanax into his dissociative defenses against social success and work recovery.  We reestablished Xanax 1 mg 3 times daily as needed for social anxiety and panic #90 with no refill discontinuing placing Valium sent to [place].  Psychoeducation establishes prevention and monitoring and safety hygiene by boundaries and clarification of consequences should he misuse the medication.  He returns for follow-up in 4 months or sooner if needed, getting angry when the expectation of his mother at last appointment she attended here is evoked patient must be responsible in nutrition, self-care, and return to work.   Delight Hoh, MD

## 2019-07-21 ENCOUNTER — Other Ambulatory Visit: Payer: Self-pay

## 2019-07-21 ENCOUNTER — Ambulatory Visit (INDEPENDENT_AMBULATORY_CARE_PROVIDER_SITE_OTHER): Payer: BC Managed Care – PPO | Admitting: Neurology

## 2019-07-21 ENCOUNTER — Encounter: Payer: Self-pay | Admitting: Neurology

## 2019-07-21 VITALS — BP 140/79 | HR 83 | Temp 98.7°F | Ht 72.0 in | Wt 217.0 lb

## 2019-07-21 DIAGNOSIS — R569 Unspecified convulsions: Secondary | ICD-10-CM

## 2019-07-21 MED ORDER — LAMOTRIGINE 25 MG PO TABS: ORAL_TABLET | ORAL | 1 refills | Status: DC

## 2019-07-21 NOTE — Patient Instructions (Signed)
We will start lamictal, when you get up to 75 mg twice a day for 2 weeks, call our office for a dose change.  Lamictal (lamotrigine) is a seizure medication that occasionally may be used for other purposes such as peripheral neuropathy pain or certain types of headache. This medication is relatively safe, but occasionally side effects can occur. A skin rash may occur when first starting the medication. As with any seizure medication, depression may worsen on the drug. Other potential side effects include dizziness, headache, drowsiness or insomnia, decreased concentration, or stomach upset. This medication may also be used as a mood stabilizer. If you believe that you are having side effects on the medication, please contact our office.

## 2019-07-21 NOTE — Progress Notes (Signed)
Reason for visit: Possible seizures  Referring physician: Dr. Tawanna Sat is a 49 y.o. male  History of present illness:  Donald Jenkins is a 49 year old right-handed white male with a history of a generalized anxiety disorder.  The patient has been taking alprazolam 1 mg 3 times a day more recently, he is claims that prior to a week ago, he was only taking the alprazolam occasionally, he has not been taking this on a daily basis until just recently.  The patient apparently had a T8 compression fracture in June 2020.  The patient apparently woke up on the floor at home and had severe back pain, the patient does not recall exactly what happened around this time.  The patient claims that since that time, he has had episodes of loss of consciousness.  He claims the last such event was 1 week ago.  The patient was admitted to the hospital on 18 May 2019 with an apparent witnessed seizure.  The patient was at work that day, he became very nauseated and was vomiting.  He felt confused.  He was waiting for someone to take him home but decided to walk, he was confused and went in the wrong direction.  He was apparently found on the street, EMS reported a 3-minute seizure event.  He was noted to have dehydration and some acidosis when he went to the hospital.  The patient apparently went home with his mother, he was still having some nausea and vomiting and his mother also noticed a seizure type event.  The patient however has had blackout events without nausea and vomiting.  The patient claims that from June until November 2020 he was on Ultram if needed for pain.  The patient however again has continued to have episodes of blacking out unassociated with tongue biting or bowel bladder incontinence.  The patient also has had events at work associated with inability to speak for several minutes.  The patient however was able to write notes during the event.  The patient also reports a 88-month history  of some troubles with memory.  He denies any severe changes in balance or difficulty controlling the bowels or the bladder.  He has no reports of numbness or weakness of the face, arms, legs.  He denies any headache problems or vision changes.  He has had MRI of the brain in November along with an EEG study, both of the studies were normal.  There was some concern about an alcohol withdrawal seizure but the patient states that he drinks alcohol only occasionally, usually on Sundays.  The patient will use marijuana occasionally.  The urine drug screen during the November hospitalization was positive for THC and benzodiazepines.  He is sent to this office for further evaluation.  Past Medical History:  Diagnosis Date  . Anxiety    social  . Hiatal hernia 2003  . Hypertension   . T8 vertebral fracture ([place])   . Tachycardia   . Tumors    on spine    Past Surgical History:  Procedure Laterality Date  . CHOLECYSTECTOMY N/A 06/05/2018   Procedure: LAPAROSCOPIC CHOLECYSTECTOMY WITH POSSIBLE INTRAOPERATIVE CHOLANGIOGRAM;  Surgeon: Clovis Riley, MD;  Location: [facility] OR;  Service: General;  Laterality: N/A;  . IR VERTEBROPLASTY CERV/THOR BX INC UNI/BIL INC/INJECT/IMAGING  12/13/2018  . KNEE SURGERY Left    in 8th grade; acl repair    Family History  Problem Relation Age of Onset  . Lung cancer Father   .  Esophageal cancer Father   . Brain cancer Father     Social history:  reports that he has quit smoking. He has never used smokeless tobacco. He reports current alcohol use. He reports previous drug use. Drug: Marijuana.  Medications:  Prior to Admission medications   Medication Sig Start Date End Date Taking? Authorizing Provider  ALPRAZolam Donald Jenkins) 1 MG tablet Take 1 tablet (1 mg total) by mouth 3 (three) times daily as needed for anxiety (panic attack). 07/13/19  Yes Delight Hoh, MD  amLODipine (NORVASC) 2.5 MG tablet Take 1 tablet (2.5 mg total) by mouth daily. To lower blood  pressure 07/13/19  Yes Fulp, Cammie, MD  famotidine (PEPCID) 20 MG tablet Take 1 tablet (20 mg total) by mouth daily. 05/20/19 05/19/20 Yes Georgette Shell, MD  levocetirizine (XYZAL) 5 MG tablet Take 5 mg by mouth at bedtime. 05/20/19  Yes [provider]  magnesium oxide (MAG-OX) 400 (241.3 Mg) MG tablet Take 2 tablets (800 mg total) by mouth daily. 05/19/19  Yes Georgette Shell, MD  montelukast (SINGULAIR) 10 MG tablet TAKE 1 TABLET BY MOUTH EVERYDAY AT BEDTIME 04/12/19  Yes Maximiano Coss, NP  Multiple Vitamin (MULTIVITAMIN WITH MINERALS) TABS tablet Take 1 tablet by mouth daily. 05/20/19  Yes Georgette Shell, MD  ondansetron (ZOFRAN) 4 MG tablet Take 1 tablet (4 mg total) by mouth daily as needed for nausea or vomiting. 05/19/19 05/18/20 Yes Georgette Shell, MD  UNABLE TO FIND Med Name: potassium with iron   Yes [provider]  UNABLE TO FIND Med Name: serrapeptase   Yes [provider]  lisinopril-hydrochlorothiazide (ZESTORETIC) 10-12.5 MG tablet Take 1 tablet by mouth daily. Patient not taking: Reported on 07/19/2019 06/16/19   Fulp, Ander Gaster, MD  methylphenidate (RITALIN) 5 MG tablet Take 1 tablet (5 mg total) by mouth 3 (three) times daily with meals. Patient not taking: Reported on 12/22/2018 11/16/18 03/03/19  Delight Hoh, MD  mirtazapine (REMERON) 15 MG tablet Take 1 tablet (15 mg total) by mouth at bedtime. Patient not taking: Reported on 03/02/2019 01/18/19 03/03/19  Antony Blackbird, MD      Allergies  Allergen Reactions  . Penicillins Other (See Comments)    Did it involve swelling of the face/tongue/throat, SOB, or low BP? Unknown Did it involve sudden or severe rash/hives, skin peeling, or any reaction on the inside of your mouth or nose? Unknown Did you need to seek medical attention at a hospital or doctor's office? Unknown When did it last happen?childhood If all above answers are "NO", may proceed with cephalosporin use.      ROS:  Out of a complete 14 system review of symptoms, the patient complains only of the following symptoms, and all other reviewed systems are negative.  Anxiety Blackouts Nausea  Blood pressure 140/79, pulse 83, temperature 98.7 F (37.1 C), height 6' (1.829 m), weight 217 lb (98.4 kg).  Physical Exam  General: The patient is alert and cooperative at the time of the examination.  Eyes: Pupils are equal, round, and reactive to light. Discs are flat bilaterally.  Neck: The neck is supple, no carotid bruits are noted.  Respiratory: The respiratory examination is clear.  Cardiovascular: The cardiovascular examination reveals a regular rate and rhythm, no obvious murmurs or rubs are noted.  Skin: Extremities are without significant edema.  Neurologic Exam  Mental status: The patient is alert and oriented x 3 at the time of the examination. The patient has apparent normal recent and  remote memory, with an apparently normal attention span and concentration ability.  Cranial nerves: Facial symmetry is present. There is good sensation of the face to pinprick and soft touch bilaterally. The strength of the facial muscles and the muscles to head turning and shoulder shrug are normal bilaterally. Speech is well enunciated, no aphasia or dysarthria is noted. Extraocular movements are full. Visual fields are full. The tongue is midline, and the patient has symmetric elevation of the soft palate. No obvious hearing deficits are noted.  Motor: The motor testing reveals 5 over 5 strength of all 4 extremities. Good symmetric motor tone is noted throughout.  Sensory: Sensory testing is intact to pinprick, soft touch, vibration sensation, and position sense on all 4 extremities. No evidence of extinction is noted.  Coordination: Cerebellar testing reveals good finger-nose-finger and heel-to-shin bilaterally.  Gait and station: Gait is normal. Tandem gait is normal. Romberg is negative. No  drift is seen.  Reflexes: Deep tendon reflexes are symmetric and normal bilaterally. Toes are downgoing bilaterally.   Assessment/Plan:  1.  Episodic blackouts, possible seizures  2.  Generalized anxiety disorder  The admission in November 2020 was associated with dehydration, vomiting, this may have caused a seizure but the patient has continued to have episodes of blacking out.  The patient does not confirm a history of alcohol abuse or any issues associated with Xanax withdrawal.  MRI of the brain and an EEG study were unremarkable.  The patient will be given an empiric trial on Lamictal, he will work up to 75 mg twice daily dose, he will call our office at that point and we will switch over to 100 mg twice daily.  We will watch him over the next 6 months, he is not to operate a motor vehicle at this time.  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  He will follow-up here in 4 months.  He will call our office if he continues to have blackout events.  Jill Alexanders MD 07/21/2019 11:15 AM  [facility] [address] [place], [place] [address]  Phone [phone] Fax [phone]

## 2019-07-23 ENCOUNTER — Encounter: Payer: Self-pay | Admitting: Neurology

## 2019-08-04 ENCOUNTER — Telehealth: Payer: Self-pay | Admitting: Family Medicine

## 2019-08-04 MED ORDER — ONDANSETRON HCL 4 MG PO TABS: 4.0000 mg | ORAL_TABLET | Freq: Every day | ORAL | 1 refills | Status: DC | PRN

## 2019-08-04 NOTE — Telephone Encounter (Signed)
Refill sent.

## 2019-08-04 NOTE — Telephone Encounter (Signed)
1) Medication(s) Requested (by name): ondansetron (ZOFRAN) 4 MG tablet   2) Pharmacy of Choice:  [id number] [place], [place], [place] [address]  3) Special Requests:   Approved medications will be sent to the pharmacy, we will reach out if there is an issue.  Requests made after 3pm may not be addressed until the following business day!  If a patient is unsure of the name of the medication(s) please note and ask patient to call back when they are able to provide all info, do not send to responsible party until all information is available!

## 2019-08-07 ENCOUNTER — Other Ambulatory Visit: Payer: Self-pay

## 2019-08-07 ENCOUNTER — Other Ambulatory Visit: Payer: Self-pay | Admitting: Psychiatry

## 2019-08-07 ENCOUNTER — Ambulatory Visit: Payer: BC Managed Care – PPO | Attending: Family Medicine | Admitting: Pharmacist

## 2019-08-07 VITALS — BP 132/88 | HR 80

## 2019-08-07 DIAGNOSIS — F410 Panic disorder [episodic paroxysmal anxiety] without agoraphobia: Secondary | ICD-10-CM

## 2019-08-07 DIAGNOSIS — I10 Essential (primary) hypertension: Secondary | ICD-10-CM

## 2019-08-07 DIAGNOSIS — Z0130 Encounter for examination of blood pressure without abnormal findings: Secondary | ICD-10-CM | POA: Diagnosis not present

## 2019-08-07 DIAGNOSIS — F4010 Social phobia, unspecified: Secondary | ICD-10-CM

## 2019-08-07 MED ORDER — ALPRAZOLAM 1 MG PO TABS: 1.0000 mg | ORAL_TABLET | Freq: Three times a day (TID) | ORAL | 0 refills | Status: DC | PRN

## 2019-08-07 NOTE — Progress Notes (Signed)
   S: PCP: Dr. Chapman Fitch    Patient arrives in good spirits. Presents to the clinic for hypertension evaluation, counseling, and management.   Patient was referred on 07/13/19 by PCP.   Patient denies adherence with amlodipine. He is taking lisinopril-HCTZ.    Current BP Medications include:  Amlodipine 2.5 mg daily (not taking); Zestoretic 10-12.5 mg daily  Dietary habits include: limits salt and caffeine  Exercise habits include: does not exercise currently but plans to Family / Social history:  - FHx: no pertinent positives - Tobacco: denies use  - Alcohol: denies use   O:  Vitals:   08/07/19 1045  BP: 132/88  Pulse: 80   Last 3 Office BP readings: BP Readings from Last 3 Encounters:  08/07/19 132/88  07/21/19 140/79  07/13/19 (!) 145/80   BMET    Component Value Date/Time   NA 135 06/15/2019 0903   K 4.2 06/15/2019 0903   CL 96 06/15/2019 0903   CO2 24 06/15/2019 0903   GLUCOSE 85 06/15/2019 0903   GLUCOSE 88 05/20/2019 0526   BUN 10 06/15/2019 0903   CREATININE 0.89 06/15/2019 0903   CREATININE 1.05 01/14/2016 0838   CALCIUM 9.9 06/15/2019 0903   GFRNONAA 102 06/15/2019 0903   GFRNONAA 86 01/14/2016 0838   GFRAA 118 06/15/2019 0903   GFRAA >89 01/14/2016 0838    Renal function: CrCl cannot be calculated (Patient's most recent lab result is older than the maximum 21 days allowed.).  Clinical ASCVD: No  The 10-year ASCVD risk score Mikey Bussing DC Jr., et al., 2013) is: 1.6%   Values used to calculate the score:     Age: 49 years     Sex: Male     Is Non-Hispanic African American: No     Diabetic: No     Tobacco smoker: No     Systolic Blood Pressure: [id number] mmHg     Is BP treated: Yes     HDL Cholesterol: 56 mg/dL     Total Cholesterol: 142 mg/dL   A/P: Hypertension longstanding currently close to goal on current medications. BP Goal = <130/80 mmHg. Patient is adherent to Zestoretic. Encouraged him to take amlodipine 2.5 mg daily as prescribed and is amenable to  doing so.  -Continued current regimen. Encouraged pt to start amlodipine.  -Counseled on lifestyle modifications for blood pressure control including reduced dietary sodium, increased exercise, adequate sleep  Results reviewed and written information provided.   Total time in face-to-face counseling 15 minutes.   F/U Clinic Visit 08/16/19.  Benard Halsted, PharmD, [place] [phone]

## 2019-08-07 NOTE — Telephone Encounter (Signed)
At last appointment 07/13/2019, treatment of social anxiety and panic was necessary to establish the patient's effective participation in work and treatment with multiple providers for several diagnoses.  He expected to return to work but the neurologist has concluded need for antiepileptic Lamictal to be established at 75 mg twice daily and possibly increased by phone to return to neuro in 4 months to have no driving in the interim and not allowed working from a height or with machinery on the job.  The patient therefore calls this office that he is not allowed to work and cannot meet the co-pay to come for appointments with Dr. Chapman Fitch PCP sending in [place] and patient asking for 1 refill on Xanax due to for another #90 if using 3 times daily on February 13.  The patient has no interim risk-taking behavior or calamity's medical situations.  His status is thereby slowly looking up but as the hurdle of neurology not clearing for at least 4 months.  Xanax is E scribed as requested 1 mg 3 times daily as needed for social and panic anxiety #90 with [facility] as patient is canceling his appointment for February 11 due to financial distress having no other definite behavior problems including by neurologist or PCP notes.

## 2019-08-07 NOTE — Telephone Encounter (Signed)
Donald Jenkins called to request refill of his alprazolam.   He had to cancel his appt for Thursday because he can't afford to come in.  Not working and has driving restrictions and insurance has a $130 copay.  Will RS when he can afford to come in.  Asks please to refill his prescription this time.  [facility] [address]

## 2019-08-10 ENCOUNTER — Ambulatory Visit: Payer: BC Managed Care – PPO | Admitting: Psychiatry

## 2019-08-14 ENCOUNTER — Telehealth: Payer: Self-pay | Admitting: Neurology

## 2019-08-14 ENCOUNTER — Encounter: Payer: Self-pay | Admitting: Neurology

## 2019-08-14 ENCOUNTER — Ambulatory Visit (INDEPENDENT_AMBULATORY_CARE_PROVIDER_SITE_OTHER): Payer: BC Managed Care – PPO | Admitting: Neurology

## 2019-08-14 ENCOUNTER — Other Ambulatory Visit: Payer: Self-pay

## 2019-08-14 VITALS — BP 100/70 | HR 101 | Temp 97.0°F | Ht 73.0 in | Wt 200.2 lb

## 2019-08-14 DIAGNOSIS — R569 Unspecified convulsions: Secondary | ICD-10-CM

## 2019-08-14 NOTE — Telephone Encounter (Signed)
Events noted, the patient has been asked not to drive for 6 months following the last seizure type event.  He is welcome to a second opinion.

## 2019-08-14 NOTE — Telephone Encounter (Signed)
I spoke to pt and relayed per [name] and SS/NP that he per previous note, reiterated not to drive for 6 months from last seizure, he is welcome to second opinion at another neurology practice if he desires. ([facility]).  He verbalized understanding (and stated "tell [name], thanks for nothing").

## 2019-08-14 NOTE — Progress Notes (Signed)
PATIENT: Donald Jenkins DOB: 13-Nov-1970  REASON FOR VISIT: follow up HISTORY FROM: patient  HISTORY OF PRESENT ILLNESS: Today 08/14/19  [name] is a 49 year old male with history of generalized anxiety disorder.  He has been evaluated at this office for possible seizures.  The last seizure event occurred May 18, 2019.  MRI of the brain and EEG have been unremarkable.  He has been placed on Lamictal. He is now taking 50 mg twice daily.  He has not had any further seizure events.  He says Lamictal is working and he is tolerating it.  He is here today seeking return to driving note.  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  He says he is not able to get to work, he cannot afford [employer], has no friends who can take him.  He was provided a return to work note at his last visit with [name] in 07/21/2019, as long as he does not drive or operate heavy machinery.  He presents today for evaluation unaccompanied, his mother brought him.   HISTORY 07/21/2019 [name]: Donald Jenkins is a 49 year old right-handed white male with a history of a generalized anxiety disorder.  The patient has been taking alprazolam 1 mg 3 times a day more recently, he is claims that prior to a week ago, he was only taking the alprazolam occasionally, he has not been taking this on a daily basis until just recently.  The patient apparently had a T8 compression fracture in June 2020.  The patient apparently woke up on the floor at home and had severe back pain, the patient does not recall exactly what happened around this time.  The patient claims that since that time, he has had episodes of loss of consciousness.  He claims the last such event was 1 week ago.  The patient was admitted to the hospital on 18 May 2019 with an apparent witnessed seizure.  The patient was at work that day, he became very nauseated and was vomiting.  He felt confused.  He was waiting for someone to take him home  but decided to walk, he was confused and went in the wrong direction.  He was apparently found on the street, EMS reported a 3-minute seizure event.  He was noted to have dehydration and some acidosis when he went to the hospital.  The patient apparently went home with his mother, he was still having some nausea and vomiting and his mother also noticed a seizure type event.  The patient however has had blackout events without nausea and vomiting.  The patient claims that from June until November 2020 he was on Ultram if needed for pain.  The patient however again has continued to have episodes of blacking out unassociated with tongue biting or bowel bladder incontinence.  The patient also has had events at work associated with inability to speak for several minutes.  The patient however was able to write notes during the event.  The patient also reports a 25-month history of some troubles with memory.  He denies any severe changes in balance or difficulty controlling the bowels or the bladder.  He has no reports of numbness or weakness of the face, arms, legs.  He denies any headache problems or vision changes.  He has had MRI of the brain in November along with an EEG study, both of the studies were normal.  There was some concern about an alcohol withdrawal seizure  but the patient states that he drinks alcohol only occasionally, usually on Sundays.  The patient will use marijuana occasionally.  The urine drug screen during the November hospitalization was positive for THC and benzodiazepines.  He is sent to this office for further evaluation.  REVIEW OF SYSTEMS: Out of a complete 14 system review of symptoms, the patient complains only of the following symptoms, and all other reviewed systems are negative.  Seizure  ALLERGIES: Allergies  Allergen Reactions  . Penicillins Other (See Comments)    Did it involve swelling of the face/tongue/throat, SOB, or low BP? Unknown Did it involve sudden or severe  rash/hives, skin peeling, or any reaction on the inside of your mouth or nose? Unknown Did you need to seek medical attention at a hospital or doctor's office? Unknown When did it last happen?childhood If all above answers are "NO", may proceed with cephalosporin use.     HOME MEDICATIONS: Outpatient Medications Prior to Visit  Medication Sig Dispense Refill  . ALPRAZolam (XANAX) 1 MG tablet Take 1 tablet (1 mg total) by mouth 3 (three) times daily as needed for anxiety (panic attack). 90 tablet 0  . amLODipine (NORVASC) 2.5 MG tablet Take 1 tablet (2.5 mg total) by mouth daily. To lower blood pressure 30 tablet 3  . famotidine (PEPCID) 20 MG tablet Take 1 tablet (20 mg total) by mouth daily. 30 tablet 1  . lamoTRIgine (LAMICTAL) 25 MG tablet One tablet twice a day for 2 weeks, then take 2 tablets twice a day for 2 weeks, then take 3 tablets twice a day 180 tablet 1  . lisinopril-hydrochlorothiazide (ZESTORETIC) 10-12.5 MG tablet Take 1 tablet by mouth daily. 90 tablet 1  . magnesium oxide (MAG-OX) 400 (241.3 Mg) MG tablet Take 2 tablets (800 mg total) by mouth daily.    . Multiple Vitamin (MULTIVITAMIN WITH MINERALS) TABS tablet Take 1 tablet by mouth daily.    . ondansetron (ZOFRAN) 4 MG tablet Take 1 tablet (4 mg total) by mouth daily as needed for nausea or vomiting. 30 tablet 1  . UNABLE TO FIND Med Name: potassium with iron    . UNABLE TO FIND Med Name: serrapeptase    . levocetirizine (XYZAL) 5 MG tablet Take 5 mg by mouth at bedtime.    . montelukast (SINGULAIR) 10 MG tablet TAKE 1 TABLET BY MOUTH EVERYDAY AT BEDTIME 30 tablet 0   No facility-administered medications prior to visit.    PAST MEDICAL HISTORY: Past Medical History:  Diagnosis Date  . Anxiety    social  . Hiatal hernia 2003  . Hypertension   . T8 vertebral fracture ([place])   . Tachycardia   . Tumors    on spine    PAST SURGICAL HISTORY: Past Surgical History:  Procedure Laterality Date  .  CHOLECYSTECTOMY N/A 06/05/2018   Procedure: LAPAROSCOPIC CHOLECYSTECTOMY WITH POSSIBLE INTRAOPERATIVE CHOLANGIOGRAM;  Surgeon: Clovis Riley, MD;  Location: [facility] OR;  Service: General;  Laterality: N/A;  . IR VERTEBROPLASTY CERV/THOR BX INC UNI/BIL INC/INJECT/IMAGING  12/13/2018  . KNEE SURGERY Left    in 8th grade; acl repair    FAMILY HISTORY: Family History  Problem Relation Age of Onset  . Lung cancer Father   . Esophageal cancer Father   . Brain cancer Father     SOCIAL HISTORY: Social History   Socioeconomic History  . Marital status: Single    Spouse name: Not on file  . Number of children: 0  . Years of education: Not  on file  . Highest education level: Not on file  Occupational History  . Occupation: Arts administrator: [employer]  Tobacco Use  . Smoking status: Former Research scientist (life sciences)  . Smokeless tobacco: Never Used  Substance and Sexual Activity  . Alcohol use: Yes    Alcohol/week: 0.0 standard drinks    Comment: socially  . Drug use: Not Currently    Types: Marijuana  . Sexual activity: Yes    Partners: Female    Birth control/protection: None  Other Topics Concern  . 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 Difficulty of Paying Living Expenses: Not hard at all  Food Insecurity: No Food Insecurity  . Worried About Charity fundraiser in the Last Year: Never true  . Ran Out of Food in the Last Year: Never true  Transportation Needs: No Transportation Needs  . Lack of Transportation (Medical): No  . Lack of Transportation (Non-Medical): No  Physical Activity: Insufficiently Active  . Days of Exercise per Week: 3 days  . Minutes of Exercise per Session: 40 min  Stress: Stress Concern Present  . Feeling of Stress : To some extent  Social Connections: Severely Isolated  . Frequency of  Communication with Friends and Family: Once a week  . Frequency of Social Gatherings with Friends and Family: Once a week  . Attends Religious Services: Never  . Active Member of Clubs or Organizations: No  . Attends Archivist Meetings: Never  . Marital Status: Never married  Intimate Partner Violence: Not At Risk  . Fear of Current or Ex-Partner: No  . Emotionally Abused: No  . Physically Abused: No  . Sexually Abused: No   PHYSICAL EXAM  Vitals:   08/14/19 0724  BP: 100/70  Pulse: (!) 101  Temp: (!) 97 F (36.1 C)  Weight: 200 lb 3.2 oz (90.8 kg)  Height: 6\' 1"  (1.854 m)   Body mass index is 26.41 kg/m.  Generalized: Well developed, in no acute distress   Neurological examination  Mentation: Alert oriented to time, place, history taking. Follows all commands speech and language fluent Cranial nerve II-XII: Pupils were equal round reactive to light. Extraocular movements were full, visual field were full on confrontational test. Facial sensation and strength were normal. Head turning and shoulder shrug  were normal and symmetric. Motor: The motor testing reveals 5 over 5 strength of all 4 extremities. Good symmetric motor tone is noted throughout.  Sensory: Sensory testing is intact to soft touch on all 4 extremities. No evidence of extinction is noted.  Coordination: Cerebellar testing reveals good finger-nose-finger and heel-to-shin bilaterally.  Gait and station: Gait is normal. Tandem gait is normal. Romberg is negative. No drift is seen.  Reflexes: Deep tendon reflexes are symmetric and normal bilaterally.   DIAGNOSTIC DATA (LABS, IMAGING, TESTING) - I reviewed patient records, labs, notes, testing and imaging myself where available.  Lab Results  Component Value Date   WBC 6.4 06/15/2019   HGB 16.2 06/15/2019   HCT 48.7 06/15/2019   MCV 85 06/15/2019   PLT 239 06/15/2019      Component Value Date/Time   NA 135 06/15/2019 0903   K 4.2 06/15/2019  0903   CL 96 06/15/2019 0903   CO2 24 06/15/2019 0903  GLUCOSE 85 06/15/2019 0903   GLUCOSE 88 05/20/2019 0526   BUN 10 06/15/2019 0903   CREATININE 0.89 06/15/2019 0903   CREATININE 1.05 01/14/2016 0838   CALCIUM 9.9 06/15/2019 0903   PROT 7.5 06/15/2019 0903   ALBUMIN 4.9 06/15/2019 0903   AST 24 06/15/2019 0903   ALT 24 06/15/2019 0903   ALKPHOS 100 06/15/2019 0903   BILITOT 0.4 06/15/2019 0903   GFRNONAA 102 06/15/2019 0903   GFRNONAA 86 01/14/2016 0838   GFRAA 118 06/15/2019 0903   GFRAA >89 01/14/2016 0838   Lab Results  Component Value Date   CHOL 142 12/08/2018   HDL 56 12/08/2018   LDLCALC 64 12/08/2018   TRIG 112 12/08/2018   CHOLHDL 2.5 12/08/2018   Lab Results  Component Value Date   HGBA1C 4.8 12/08/2018   Lab Results  Component Value Date   [id number] 03/09/2019   Lab Results  Component Value Date   TSH 1.098 05/18/2019      ASSESSMENT AND PLAN 49 y.o. year old male  has a past medical history of Anxiety, Hiatal hernia (2003), Hypertension, T8 vertebral fracture ([place]), Tachycardia, and Tumors. here with:  1.  Episodic blackouts, possible seizures 2.  Generalized anxiety disorder  He was just seen by [name] 07/21/2019. He remains on Lamictal working up to 100 mg twice a day.  He is currently taking 50 mg twice a day.  He has not had any further seizure events since May 18, 2019.  He wishes for [place] to remove his driving restrictions.  I will not deviate from the driving restriction.  He is not to operate a motor vehicle until he is seizure-free for 6 months, which would be Nov 15, 2019 .  He has been given a note for his work, stating that he is able to return to work in full capacity, with the exception of operating heavy equipment, climbing heights, or operate a motor vehicle. He may seek a second opinion if he feels necessary.  He will contact our office, when the Lamictal 100 mg BID is due for new prescription.  He will call for recurrent  seizure, otherwise will follow-up in 6 months or sooner if needed.   I spent 25 minutes with the patient. 50% of this time was spent discussing his plan of care.   Butler Denmark, AGNP-C, DNP 08/14/2019, 8:09 AM [facility] [address] [place],  [place] [address] [phone]

## 2019-08-14 NOTE — Telephone Encounter (Signed)
Pt stopped at check out to schedule follow up- when discussing that NP had put in for a 6 month f/u offered appt for 8/16 pt states "that is way to far out I will not wait that long" proceed to say "that MD did not do "crap" for him, nor did he take the time to even look into my chart". Pt states because of the delays in his appt dates he "will lose my job". And requested to be seen earlier- scheduled pt for 4/21 as asked. Before leaving pt asked for other neurology offices in [place] stating he would call them for second opinion and wishes he never came here".

## 2019-08-14 NOTE — Progress Notes (Signed)
I have read the note, and I agree with the clinical assessment and plan.  Keatin Benham K Asher Babilonia   

## 2019-08-15 ENCOUNTER — Encounter: Payer: Self-pay | Admitting: *Deleted

## 2019-08-16 ENCOUNTER — Ambulatory Visit: Payer: BC Managed Care – PPO | Admitting: Family Medicine

## 2019-08-16 NOTE — Progress Notes (Signed)
Late entry- August 15, 2019 @ 4:21.  Spoke to Donald Jenkins regarding appointment for today, February 17,2021 at [facility]. Donald Jenkins was made aware that that per Dr. Chapman Fitch if his visit is regarding his driving restrictions following his recent seizure that Dr. Chapman Fitch cannot lift his driving restriction which is state mandated. He has been seen by Neurology twice and they told him about the restrictions.  Patient asked for letters for his employer. Informed that letters would be ready for pick up from the front desk. Patient verbalized understanding.

## 2019-08-19 ENCOUNTER — Other Ambulatory Visit: Payer: Self-pay | Admitting: Psychiatry

## 2019-08-19 DIAGNOSIS — F4010 Social phobia, unspecified: Secondary | ICD-10-CM

## 2019-08-26 ENCOUNTER — Other Ambulatory Visit: Payer: Self-pay | Admitting: Family Medicine

## 2019-08-26 DIAGNOSIS — F419 Anxiety disorder, unspecified: Secondary | ICD-10-CM

## 2019-08-26 DIAGNOSIS — F329 Major depressive disorder, single episode, unspecified: Secondary | ICD-10-CM

## 2019-08-26 DIAGNOSIS — F32A Depression, unspecified: Secondary | ICD-10-CM

## 2019-08-30 ENCOUNTER — Encounter: Payer: Self-pay | Admitting: *Deleted

## 2019-08-30 ENCOUNTER — Telehealth: Payer: Self-pay | Admitting: Neurology

## 2019-08-30 NOTE — Telephone Encounter (Signed)
I called LMVM for pt that letter done back in January this can be printed and signed by [name].  Or does he need that he is able to return to work, but not drive for 6 months from last seizures.  Let me know.

## 2019-08-30 NOTE — Telephone Encounter (Signed)
Pt called stating that his work is needing a letter saying he can return to work and that he understands that he has driving restrictions. Pt states that this note has to be from a MD not a Environmental consultant. Please advise.

## 2019-08-31 NOTE — Telephone Encounter (Signed)
I will reprint the January 2021 letter.

## 2019-08-31 NOTE — Telephone Encounter (Signed)
I called pt and he would like to get letter, reprinted to sign by [name]. Pt will pick up tomorrow prior to 1200, relayed that we are not open usually , only to new pts.

## 2019-09-07 ENCOUNTER — Other Ambulatory Visit: Payer: Self-pay | Admitting: Psychiatry

## 2019-09-07 DIAGNOSIS — F4010 Social phobia, unspecified: Secondary | ICD-10-CM

## 2019-09-07 DIAGNOSIS — F410 Panic disorder [episodic paroxysmal anxiety] without agoraphobia: Secondary | ICD-10-CM

## 2019-09-07 MED ORDER — ALPRAZOLAM 1 MG PO TABS: 1.0000 mg | ORAL_TABLET | Freq: Three times a day (TID) | ORAL | 0 refills | Status: DC | PRN

## 2019-09-07 NOTE — Telephone Encounter (Signed)
Social anxiety and panic are reportedly still requiring Xanax provided as a 90-day supply last appointment 07/13/2019 and renewed 08/11/2019 and [place] registry documenting that dispensing.  The patient refused other medications for his anxiety and depression, but while seeing [name] for Dr. Chapman Fitch concluding Lamictal necessary restarted at 25 mg titrating up to 75 mg daily, patient is not allowed to drive for 6 months and therefore may not be at work.  He phones reception about financial difficulties and had agreed to follow-up in 4 months in January.  Refilling the Xanax is therefore provided as a 30-day supply having no misuse documented in the interim, and at last appointment he intended treatment to allow him to restore a social life, work, and family life.  Xanax 1 mg 3 times daily #90 with no refill is sent to [facility] for 09/09/2019 expected need for renewal of the month supply though hopefully dose can be tapered over time as he is successful and restores Lamictal such as possibly to reduce the Xanax to twice a day as needed in the future.

## 2019-09-07 NOTE — Telephone Encounter (Signed)
[name] called wanting refill of his Xanax.  He does not have an appt. Was due back in February but he is on driving restrictions and insurance has changed and copay is over $100 and his bill is already high.  He just afford to come in.  Please refill his medication.  [facility] [address]

## 2019-09-27 ENCOUNTER — Telehealth: Payer: Self-pay | Admitting: Neurology

## 2019-09-27 ENCOUNTER — Telehealth: Payer: Self-pay

## 2019-09-27 NOTE — Telephone Encounter (Signed)
Has appt 10-18-19 with SS/NP.  If earlier time becomes available can call and see if can come in.

## 2019-09-27 NOTE — Telephone Encounter (Signed)
Patient called stating he was advised by his HR department he needs to be seen by MD   for back to work clearance although a letter was given stating patient is clear to go back to work. They are requiring he have a recent visit with MD for seizure evaluation   HR rep sharon #[phone]   no sooner apt than September available with MD

## 2019-09-27 NOTE — Telephone Encounter (Signed)
With patient's consent, a referral had been sent to [employer] [place] on 08/16/2019  for assistance with filing for disability.  Message received from [facility]/[employer] [place] noting they they most recently left a message for the patient 09/08/2019. No further information available.

## 2019-09-28 ENCOUNTER — Telehealth: Payer: Self-pay | Admitting: Neurology

## 2019-09-28 ENCOUNTER — Encounter: Payer: Self-pay | Admitting: *Deleted

## 2019-09-28 NOTE — Telephone Encounter (Signed)
Pt is asking if RN will call HR rep sharon [phone] and relay to her what he has told her re: pt having an upcoming appointment with NP.  Pt has expressed his need to return to work and how beneficial it would be for his HR rep to hear from the Dr's office and not just him. Please call Sharon(in HR) for pt.

## 2019-09-28 NOTE — Telephone Encounter (Signed)
Letter to [name] to sign , the will fax to [facility] in [place].

## 2019-09-28 NOTE — Telephone Encounter (Signed)
I called and spoke to [place], [place]. Requesting another letter stating pt has been seen 08-14-2019, compliant with taking lamictal. po BID, no seizures since last November 19-2020.  I LMVM for pt to return call about his lamictal dose so I can place in letter. Fax [phone]. They also requested dr Jannifer Franklin sign letter.

## 2019-09-28 NOTE — Telephone Encounter (Signed)
LMVM for pt x 3 to let me know how much lamotrigine he is on right now.

## 2019-09-28 NOTE — Telephone Encounter (Signed)
Pt called stating that his work is needing a work release from his provider. Please advise.

## 2019-09-28 NOTE — Telephone Encounter (Signed)
I have printed the letter, will sign the letter.  The patient will be able to drive again in May [phone].

## 2019-10-06 ENCOUNTER — Other Ambulatory Visit: Payer: Self-pay | Admitting: Psychiatry

## 2019-10-06 DIAGNOSIS — F4010 Social phobia, unspecified: Secondary | ICD-10-CM

## 2019-10-06 DIAGNOSIS — F410 Panic disorder [episodic paroxysmal anxiety] without agoraphobia: Secondary | ICD-10-CM

## 2019-10-06 NOTE — Telephone Encounter (Signed)
No interim medical care has been required patient only by phone collaborating with occupational medicine and neurology to restore driver's license and return to work.  He needs next dispensing of Xanax April 11 or 12 relative to picking up the last eScription 2 days after requested on 09/09/2019 per [facility] registry.  He has appointment 10/12/2019 expecting he will follow through relative to these psychiatric medications he is requiring.

## 2019-10-06 NOTE — Telephone Encounter (Signed)
Next apt 04/15

## 2019-10-09 ENCOUNTER — Other Ambulatory Visit: Payer: Self-pay | Admitting: Neurosurgery

## 2019-10-09 ENCOUNTER — Other Ambulatory Visit: Payer: Self-pay | Admitting: Pharmacist

## 2019-10-09 DIAGNOSIS — C719 Malignant neoplasm of brain, unspecified: Secondary | ICD-10-CM

## 2019-10-09 MED ORDER — ONDANSETRON HCL 4 MG PO TABS: 4.0000 mg | ORAL_TABLET | Freq: Every day | ORAL | 1 refills | Status: DC | PRN

## 2019-10-12 ENCOUNTER — Ambulatory Visit: Payer: BC Managed Care – PPO | Admitting: Psychiatry

## 2019-10-18 ENCOUNTER — Ambulatory Visit: Payer: BC Managed Care – PPO | Admitting: Neurology

## 2019-10-23 ENCOUNTER — Ambulatory Visit: Payer: BC Managed Care – PPO | Admitting: Neurology

## 2019-10-31 ENCOUNTER — Other Ambulatory Visit: Payer: Self-pay

## 2019-10-31 ENCOUNTER — Telehealth: Payer: Self-pay | Admitting: Psychiatry

## 2019-10-31 DIAGNOSIS — F410 Panic disorder [episodic paroxysmal anxiety] without agoraphobia: Secondary | ICD-10-CM

## 2019-10-31 DIAGNOSIS — F4010 Social phobia, unspecified: Secondary | ICD-10-CM

## 2019-10-31 MED ORDER — ALPRAZOLAM 1 MG PO TABS: 1.0000 mg | ORAL_TABLET | Freq: Three times a day (TID) | ORAL | 0 refills | Status: DC | PRN

## 2019-10-31 NOTE — Telephone Encounter (Signed)
Pt made appt for 6/7.  Pt will have new insurance after 6/1.Pt would like a refill on alprazolam. [facility] [address]

## 2019-10-31 NOTE — Telephone Encounter (Signed)
Donald Jenkins continues to delay his office appointment by monthly intervals as he now clarifies he will have insurance available June 1 and return for office appointment at that time.  He is collaborative with neurology on taking Lamictal and returning to driving as they direct likely to also extend to work if employer still allows him to return.  I first worked with him after years of sociall avoidance successfully worked through with various medications including Xanax now using Xanax alone besides his neurology medication.  For his last eScription of Xanax and his next pickup with expected course of treatment as to maximum dosing, time of next appointment, and the absence of medical acting out and chaotic calamity, he is E scribed 1 more month of Xanax 1 mg 3 times daily as needed #90 with no ref[facility] as [facility], with no contact from his mother or PCP in the interim.

## 2019-11-02 ENCOUNTER — Other Ambulatory Visit: Payer: BC Managed Care – PPO

## 2019-11-21 ENCOUNTER — Ambulatory Visit: Payer: BC Managed Care – PPO | Admitting: Neurology

## 2019-11-21 DIAGNOSIS — Z0271 Encounter for disability determination: Secondary | ICD-10-CM

## 2019-11-22 ENCOUNTER — Telehealth: Payer: Self-pay

## 2019-11-22 NOTE — Telephone Encounter (Signed)
As per Donald Jenkins/[employer] [place], they have spoken to the patient and he has rea-applied for SSI and is waiting for a response.  They will not be able to assist him unless he is denied.  He has been instructed to call Legal Aid if he receives a denial from [place].

## 2019-11-30 ENCOUNTER — Other Ambulatory Visit: Payer: Self-pay | Admitting: Psychiatry

## 2019-11-30 DIAGNOSIS — F4010 Social phobia, unspecified: Secondary | ICD-10-CM

## 2019-11-30 DIAGNOSIS — F410 Panic disorder [episodic paroxysmal anxiety] without agoraphobia: Secondary | ICD-10-CM

## 2019-11-30 MED ORDER — ALPRAZOLAM 1 MG PO TABS: 1.0000 mg | ORAL_TABLET | Freq: Three times a day (TID) | ORAL | 0 refills | Status: DC | PRN

## 2019-11-30 NOTE — Telephone Encounter (Signed)
Donald Jenkins calledto request refill of his Xanax.  He has lost he job and insurance so he cancelled his appt for 6/7.  He is signing up for insurance and can reschedule once he has it in place but needs his medication in the meantime.

## 2019-11-30 NOTE — Telephone Encounter (Signed)
Patient again cancels his appointment now stating job loss and no insurance so the last appointment was 5 months ago January 14 so I send 1 more refill for Xanax update 1 mg 3 times daily as needed #90 with no refill for June 6 at [facility] with message to patient as no answer that he has to be seen every 6 months to prescribe that controlled substance so he may wish having no other appointments, medications, or therapy to ask his PCP or neurologist to take over the Xanax regimen as well.

## 2019-12-01 ENCOUNTER — Other Ambulatory Visit: Payer: BC Managed Care – PPO

## 2019-12-04 ENCOUNTER — Ambulatory Visit: Payer: BC Managed Care – PPO | Admitting: Psychiatry

## 2019-12-08 ENCOUNTER — Telehealth: Payer: Self-pay | Admitting: Family Medicine

## 2019-12-08 MED ORDER — ONDANSETRON HCL 4 MG PO TABS: 4.0000 mg | ORAL_TABLET | Freq: Every day | ORAL | 0 refills | Status: DC | PRN

## 2019-12-08 NOTE — Telephone Encounter (Signed)
Patient called in and requested for listed medication to be refilled and [id number] [address], [place], [place] [address]  ondansetron (ZOFRAN) 4 MG tablet [[id number]]

## 2019-12-08 NOTE — Telephone Encounter (Signed)
Rx sent 

## 2019-12-28 ENCOUNTER — Other Ambulatory Visit: Payer: Self-pay | Admitting: Family Medicine

## 2020-01-02 ENCOUNTER — Other Ambulatory Visit: Payer: BC Managed Care – PPO

## 2020-01-04 ENCOUNTER — Ambulatory Visit: Payer: BC Managed Care – PPO | Admitting: Psychiatry

## 2020-01-18 ENCOUNTER — Other Ambulatory Visit: Payer: BLUE CROSS/BLUE SHIELD

## 2020-01-25 ENCOUNTER — Other Ambulatory Visit: Payer: Self-pay | Admitting: Neurology

## 2020-01-25 MED ORDER — LAMOTRIGINE 100 MG PO TABS: 100.0000 mg | ORAL_TABLET | Freq: Two times a day (BID) | ORAL | 1 refills | Status: DC

## 2020-01-29 ENCOUNTER — Ambulatory Visit: Payer: BC Managed Care – PPO | Admitting: Psychiatry

## 2020-01-30 ENCOUNTER — Other Ambulatory Visit: Payer: Self-pay | Admitting: Family Medicine

## 2020-01-30 DIAGNOSIS — R112 Nausea with vomiting, unspecified: Secondary | ICD-10-CM

## 2020-02-01 ENCOUNTER — Ambulatory Visit: Payer: Self-pay | Admitting: Psychiatry

## 2020-02-05 ENCOUNTER — Ambulatory Visit: Payer: BC Managed Care – PPO | Admitting: Neurology

## 2020-02-06 ENCOUNTER — Ambulatory Visit: Payer: Self-pay | Admitting: Psychiatry

## 2020-02-08 ENCOUNTER — Other Ambulatory Visit: Payer: Self-pay

## 2020-02-08 ENCOUNTER — Encounter: Payer: Self-pay | Admitting: Psychiatry

## 2020-02-08 ENCOUNTER — Ambulatory Visit (INDEPENDENT_AMBULATORY_CARE_PROVIDER_SITE_OTHER): Payer: 59 | Admitting: Psychiatry

## 2020-02-08 VITALS — Ht 73.0 in | Wt 231.0 lb

## 2020-02-08 DIAGNOSIS — F410 Panic disorder [episodic paroxysmal anxiety] without agoraphobia: Secondary | ICD-10-CM | POA: Diagnosis not present

## 2020-02-08 DIAGNOSIS — F4010 Social phobia, unspecified: Secondary | ICD-10-CM | POA: Diagnosis not present

## 2020-02-08 DIAGNOSIS — F3341 Major depressive disorder, recurrent, in partial remission: Secondary | ICD-10-CM | POA: Diagnosis not present

## 2020-02-08 MED ORDER — ALPRAZOLAM 1 MG PO TABS: 1.0000 mg | ORAL_TABLET | Freq: Three times a day (TID) | ORAL | 1 refills | Status: DC | PRN

## 2020-02-08 NOTE — Progress Notes (Signed)
[facility]  Patient ID: Donald Jenkins,  MRN: [id number]  PCP: Antony Blackbird, MD  Date of Evaluation: 02/08/2020 Time spent:25 minutes from 1405 to 1430  Chief Complaint:  Chief Complaint    Anxiety; Panic Attack; Depression      HISTORY/CURRENT STATUS: Donald Jenkins is seen onsite in office 25 minutes face-to-face with consent with epic collateral for psychiatric interview and exam in 67-month evaluation and management of social and panic anxiety and history of major depression currently in partial remission.  Patient is currently concerned about the reemergence of severe social anxiety in the last 4 to 6 weeks since he completely ran out of Xanax.  He is 3 months overdue for follow-up from last appointment here 1 month beyond the medical board mandate of 6 month follow-up to continue Xanax E scribing.  Patient concludes that his previous seizure activity had only one cause being sleep deprivation. He has been sleeping quite well since working at [employer] in [place], [place] a 6-hour drive he completed easily for approximately 1 to 2 months working for [employer] there living in hotel quarters they gave him.  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  They will send the MRI to the neurosurgeon to read so the patient can know if he needs surgery or not.  He stopped his Lamictal getting his driver's license back May 19 and called here repeatedly for Xanax refills until no longer possible deferring his appointments He sometimes takes 2 Xanax through the night to sustain sleep.  The patient is very black and white in his consideration of diagnoses, medications, other treatments, relationships, and job.  He knows he can see the partner of [name] as [name] to get a sleep study.  He has new insurance now that it can cover the MRI and other consults.  He  seems apprehensive about leaving [facility] though he has more negative social experiences here but is fixated in doing the same.  He is positive about his ex- girlfriend in [place] except she is likely moving out [place].  He was told not to take the [facility] by Neurology relative to his seizure history and has tried every other sleep approach unsuccessfully now needing sleep hygiene and reassurance as to whether he has a sleep disorder as the cause, though he did sleep well at the hotel in [place].  He therefore has ways of defining problems limiting solutions to follow in a stepwise fashion if he will disengage from making one thing depend upon another.  Panic attacks are moderate and several weekly at the most currently, though he expects them to increase.  He also expects to become depressed again over his current status, including having applied for disability but apparently not hearing any outcome. He has no mania, suicidality, psychosis or delirium.  Depression This is a recurrentepisodicproblemwhich hadbeen significantly relieved by interventions includingforhis social anxiety overthe last 2yearsnow withsituational dysphoria for life consequencesfromhis other most recent somatoform symptoms.Episodic panicis notresponseprevented when he lacks any available rescue being out of Xanax. All depressiononset quality is sudden. The problem occurs intermittently.The problem hasprovoked warningonsetbeforerecurrence.The ED questionedwhether he was not depressed again as the mechanism of his memory disturbance.Associated symptoms include social anxiety, panic, repressed anger, denial, decreased concentration,amnesia,learned helplessness hopelessness, and insomnia. Associated symptomsdo notinclude irritability, fatigue, decreased interest, headaches, body aches,orsuicidal ideas.The symptoms are aggravated by work stress and  social issues.Past treatments include SSRIs -  Selective serotonin reuptake inhibitors and other medications such as antiepileptics and anxiolytics.  Individual Medical History/ Review of Systems: Changes? :Yes with weight gain of 22 pounds in 6 months.  He concludes the seizures here are over and he has no back pain but he still needs an MRI and neurosurgery review of his conus benign tumor and likely a sleep consult and study if he remains here though he slept well in [place].   Allergies: Penicillins  Current Medications:  Current Outpatient Medications:  .  amLODipine (NORVASC) 2.5 MG tablet, Take 1 tablet (2.5 mg total) by mouth daily. To lower blood pressure, Disp: 30 tablet, Rfl: 3 .  famotidine (PEPCID) 20 MG tablet, Take 1 tablet (20 mg total) by mouth daily., Disp: 30 tablet, Rfl: 1 .  lamoTRIgine (LAMICTAL) 100 MG tablet, Take 1 tablet (100 mg total) by mouth 2 (two) times daily., Disp: 180 tablet, Rfl: 1 .  levocetirizine (XYZAL) 5 MG tablet, TAKE 1 TABLET (5 MG TOTAL) BY MOUTH EVERY EVENING. TO HELP WITH NASAL ALLERGIES, Disp: 90 tablet, Rfl: 1 .  lisinopril-hydrochlorothiazide (ZESTORETIC) 10-12.5 MG tablet, TAKE 1 TABLET BY MOUTH EVERY DAY, Disp: 90 tablet, Rfl: 0 .  magnesium oxide (MAG-OX) 400 (241.3 Mg) MG tablet, Take 2 tablets (800 mg total) by mouth daily., Disp:  , Rfl:  .  Multiple Vitamin (MULTIVITAMIN WITH MINERALS) TABS tablet, Take 1 tablet by mouth daily., Disp:  , Rfl:  .  ondansetron (ZOFRAN) 4 MG tablet, Take 1 tablet (4 mg total) by mouth daily as needed for nausea or vomiting. Please schedule PCP visit for more refills., Disp: 30 tablet, Rfl: 0 .  UNABLE TO FIND, Med Name: potassium with iron, Disp: , Rfl:  .  UNABLE TO FIND, Med Name: serrapeptase, Disp: , Rfl:   Medication Side Effects: none  Family Medical/ Social History: Changes? Yes, he did stop to celebrate mother's birthday driving 6 hours back from [place] ambivalent about whether he will accept the job available there to continue days he concludes  the seizures here are over and he has no back pain but he still needs an MRI and neurosurgery review of his conus benign tumor and likely a sleep consult and study if he remains here though he slept well in [place].  MENTAL HEALTH EXAM:  Height 6\' 1"  (1.854 m), weight 231 lb (104.8 kg).Body mass index is 30.48 kg/m. Muscle strengths and tone 5/5, postural reflexes and gait 0/0, and AIMS = 0.  General Appearance: Casual, Fairly Groomed, Guarded and Meticulous  Eye Contact:  Good  Speech:  Clear and Coherent, Normal Rate and Talkative  Volume:  Normal  Mood:  Anxious, Dysphoric, Euthymic and Irritable  Affect:  Congruent, Inappropriate, Restricted and Anxious  Thought Process:  Coherent, Irrelevant and Descriptions of Associations: Tangential and Circumstatial  Orientation:  Full (Time, Place, and Person)  Thought Content: Obsessions, Rumination and Tangential   Suicidal Thoughts:  No  Homicidal Thoughts:  No  Memory:  Immediate;   Good Remote;   Good  Judgement:  Fair  Insight:  Fair and Lacking  Psychomotor Activity:  Normal, Increased, Decreased and Mannerisms  Concentration:  Concentration: Fair and Attention Span: Good  Recall:  Good  Fund of Knowledge: Good  Language: Good  Assets:  Desire for Improvement Resilience Talents/Skills Transportation  ADL's:  Intact  Cognition: WNL  Prognosis:  Fair    DIAGNOSES:    ICD-10-CM   1. Social anxiety disorder  F40.10   2. Panic disorder  F41.0   3. Recurrent major depression in partial remission (HCC)  F33.41     Receiving Psychotherapy: No    RECOMMENDATIONS: Over 50% of the 25-minute face-to-face time for total of 15 minutes is spent in counseling and coordination of care allowing orderly definition of health assurance through his completion of neurosurgical consultation, sleep disorder consultation, and consideration of psychotherapy relative to his social and panic anxiety and history of dissociation.  However the course of  over 6 years of treatment in this office can evaluate he has used Xanax safely and appropriately somatoform fixated in his anxiety and anger.  He hesitates to exert any interest and effort in the ex-girlfriend he obviously still appreciates by his response in the office today.  Exposure desensitization habit reversal thought stopping response prevention CBT addresses social skills, sleep hygiene, and frustration management.  He is escribed Xanax 1 mg three times daily as needed for socially avoidant anxiety, panic, and insomnia #270 with 1 refill to [facility] for social anxiety disorder and panic disorder.  He understands follow-up in 6 months or sooner if needed.   Delight Hoh, MD

## 2020-02-12 ENCOUNTER — Ambulatory Visit: Payer: BC Managed Care – PPO | Admitting: Neurology

## 2020-02-13 ENCOUNTER — Ambulatory Visit: Payer: BC Managed Care – PPO | Admitting: Neurology

## 2020-03-06 ENCOUNTER — Emergency Department: Payer: 59

## 2020-03-06 ENCOUNTER — Inpatient Hospital Stay
Admission: EM | Admit: 2020-03-06 | Discharge: 2020-03-09 | DRG: 369 | Disposition: A | Discharge status: Home / Self Care | Payer: 59 | Attending: Internal Medicine | Admitting: Internal Medicine

## 2020-03-06 ENCOUNTER — Encounter: Payer: Self-pay | Admitting: Emergency Medicine

## 2020-03-06 ENCOUNTER — Other Ambulatory Visit: Payer: Self-pay

## 2020-03-06 DIAGNOSIS — C719 Malignant neoplasm of brain, unspecified: Secondary | ICD-10-CM

## 2020-03-06 DIAGNOSIS — T426X6A Underdosing of other antiepileptic and sedative-hypnotic drugs, initial encounter: Secondary | ICD-10-CM | POA: Diagnosis present

## 2020-03-06 DIAGNOSIS — F418 Other specified anxiety disorders: Secondary | ICD-10-CM | POA: Diagnosis present

## 2020-03-06 DIAGNOSIS — D334 Benign neoplasm of spinal cord: Secondary | ICD-10-CM | POA: Diagnosis present

## 2020-03-06 DIAGNOSIS — Z801 Family history of malignant neoplasm of trachea, bronchus and lung: Secondary | ICD-10-CM

## 2020-03-06 DIAGNOSIS — F5105 Insomnia due to other mental disorder: Secondary | ICD-10-CM | POA: Diagnosis present

## 2020-03-06 DIAGNOSIS — D649 Anemia, unspecified: Secondary | ICD-10-CM | POA: Diagnosis present

## 2020-03-06 DIAGNOSIS — Z800 Family history of malignant neoplasm of digestive organs: Secondary | ICD-10-CM

## 2020-03-06 DIAGNOSIS — G40909 Epilepsy, unspecified, not intractable, without status epilepticus: Secondary | ICD-10-CM | POA: Diagnosis present

## 2020-03-06 DIAGNOSIS — K449 Diaphragmatic hernia without obstruction or gangrene: Secondary | ICD-10-CM | POA: Diagnosis present

## 2020-03-06 DIAGNOSIS — Z808 Family history of malignant neoplasm of other organs or systems: Secondary | ICD-10-CM

## 2020-03-06 DIAGNOSIS — Z91138 Patient's unintentional underdosing of medication regimen for other reason: Secondary | ICD-10-CM

## 2020-03-06 DIAGNOSIS — K2101 Gastro-esophageal reflux disease with esophagitis, with bleeding: Principal | ICD-10-CM | POA: Diagnosis present

## 2020-03-06 DIAGNOSIS — F32A Depression, unspecified: Secondary | ICD-10-CM

## 2020-03-06 DIAGNOSIS — M4856XA Collapsed vertebra, not elsewhere classified, lumbar region, initial encounter for fracture: Secondary | ICD-10-CM | POA: Diagnosis present

## 2020-03-06 DIAGNOSIS — E876 Hypokalemia: Secondary | ICD-10-CM | POA: Diagnosis present

## 2020-03-06 DIAGNOSIS — S22000A Wedge compression fracture of unspecified thoracic vertebra, initial encounter for closed fracture: Secondary | ICD-10-CM | POA: Diagnosis present

## 2020-03-06 DIAGNOSIS — N179 Acute kidney failure, unspecified: Secondary | ICD-10-CM | POA: Diagnosis present

## 2020-03-06 DIAGNOSIS — Z87891 Personal history of nicotine dependence: Secondary | ICD-10-CM

## 2020-03-06 DIAGNOSIS — S32000A Wedge compression fracture of unspecified lumbar vertebra, initial encounter for closed fracture: Secondary | ICD-10-CM | POA: Diagnosis present

## 2020-03-06 DIAGNOSIS — D72829 Elevated white blood cell count, unspecified: Secondary | ICD-10-CM | POA: Diagnosis present

## 2020-03-06 DIAGNOSIS — I10 Essential (primary) hypertension: Secondary | ICD-10-CM | POA: Diagnosis present

## 2020-03-06 DIAGNOSIS — E871 Hypo-osmolality and hyponatremia: Secondary | ICD-10-CM | POA: Diagnosis present

## 2020-03-06 DIAGNOSIS — Z20822 Contact with and (suspected) exposure to covid-19: Secondary | ICD-10-CM | POA: Diagnosis present

## 2020-03-06 DIAGNOSIS — K219 Gastro-esophageal reflux disease without esophagitis: Secondary | ICD-10-CM | POA: Diagnosis present

## 2020-03-06 DIAGNOSIS — M4854XA Collapsed vertebra, not elsewhere classified, thoracic region, initial encounter for fracture: Secondary | ICD-10-CM | POA: Diagnosis present

## 2020-03-06 DIAGNOSIS — K319 Disease of stomach and duodenum, unspecified: Secondary | ICD-10-CM | POA: Diagnosis present

## 2020-03-06 DIAGNOSIS — F329 Major depressive disorder, single episode, unspecified: Secondary | ICD-10-CM | POA: Diagnosis present

## 2020-03-06 DIAGNOSIS — F419 Anxiety disorder, unspecified: Secondary | ICD-10-CM | POA: Diagnosis present

## 2020-03-06 DIAGNOSIS — F5104 Psychophysiologic insomnia: Secondary | ICD-10-CM | POA: Diagnosis present

## 2020-03-06 DIAGNOSIS — R569 Unspecified convulsions: Secondary | ICD-10-CM

## 2020-03-06 DIAGNOSIS — G4700 Insomnia, unspecified: Secondary | ICD-10-CM | POA: Diagnosis present

## 2020-03-06 DIAGNOSIS — K920 Hematemesis: Secondary | ICD-10-CM | POA: Diagnosis present

## 2020-03-06 LAB — CBC
HCT: 48.5 % (ref 39.0–52.0)
Hemoglobin: 16 g/dL (ref 13.0–17.0)
MCH: 30.3 pg (ref 26.0–34.0)
MCHC: 33 g/dL (ref 30.0–36.0)
MCV: 91.9 fL (ref 80.0–100.0)
Platelets: 280 10*3/uL (ref 150–400)
RBC: 5.28 MIL/uL (ref 4.22–5.81)
RDW: 12.5 % (ref 11.5–15.5)
WBC: 13.5 10*3/uL — ABNORMAL HIGH (ref 4.0–10.5)
nRBC: 0.1 % (ref 0.0–0.2)

## 2020-03-06 LAB — BASIC METABOLIC PANEL
Anion gap: 27 — ABNORMAL HIGH (ref 5–15)
BUN: 26 mg/dL — ABNORMAL HIGH (ref 6–20)
CO2: 13 mmol/L — ABNORMAL LOW (ref 22–32)
Calcium: 9.2 mg/dL (ref 8.9–10.3)
Chloride: 98 mmol/L (ref 98–111)
Creatinine, Ser: 1.52 mg/dL — ABNORMAL HIGH (ref 0.61–1.24)
GFR calc Af Amer: >60 mL/min (ref >60)
GFR calc non Af Amer: 53 mL/min — ABNORMAL LOW (ref >60)
Glucose, Bld: 180 mg/dL — ABNORMAL HIGH (ref 70–99)
Potassium: 3.4 mmol/L — ABNORMAL LOW (ref 3.5–5.1)
Sodium: 138 mmol/L (ref 135–145)

## 2020-03-06 MED ORDER — ESZOPICLONE 1 MG PO TABS: 1.0000 mg | ORAL_TABLET | Freq: Every evening | ORAL | 0 refills | Status: DC | PRN

## 2020-03-06 MED ORDER — KETOROLAC TROMETHAMINE 30 MG/ML IJ SOLN
15.0000 mg | Freq: Once | INTRAMUSCULAR | Status: AC
  Administered 2020-03-06: 15 mg via INTRAVENOUS
  Filled 2020-03-06: qty 1

## 2020-03-06 MED ORDER — ONDANSETRON HCL 4 MG/2ML IJ SOLN
4.0000 mg | Freq: Once | INTRAMUSCULAR | Status: AC
  Administered 2020-03-06: 4 mg via INTRAVENOUS
  Filled 2020-03-06: qty 2

## 2020-03-06 MED ORDER — ZOLPIDEM TARTRATE 5 MG PO TABS
5.0000 mg | ORAL_TABLET | Freq: Every evening | ORAL | Status: DC | PRN
  Administered 2020-03-06 – 2020-03-08 (×3): 5 mg via ORAL
  Filled 2020-03-06 (×3): qty 1

## 2020-03-06 MED ORDER — LAMOTRIGINE 25 MG PO TABS: 25.0000 mg | ORAL_TABLET | Freq: Every day | ORAL | Status: DC

## 2020-03-06 MED ORDER — HYDROCODONE-ACETAMINOPHEN 5-325 MG PO TABS
1.0000 | ORAL_TABLET | Freq: Four times a day (QID) | ORAL | 0 refills | Status: DC | PRN
Start: 2020-03-06 — End: 2020-05-22

## 2020-03-06 MED ORDER — OXYCODONE-ACETAMINOPHEN 5-325 MG PO TABS
1.0000 | ORAL_TABLET | Freq: Four times a day (QID) | ORAL | Status: DC | PRN
  Administered 2020-03-06: 1 via ORAL
  Administered 2020-03-07: 2 via ORAL
  Filled 2020-03-06: qty 1
  Filled 2020-03-06: qty 2

## 2020-03-06 MED ORDER — MORPHINE SULFATE (PF) 4 MG/ML IV SOLN
4.0000 mg | Freq: Once | INTRAVENOUS | Status: AC
  Administered 2020-03-06: 4 mg via INTRAVENOUS
  Filled 2020-03-06: qty 1

## 2020-03-06 MED ORDER — LAMOTRIGINE 25 MG PO TABS: 25.0000 mg | ORAL_TABLET | Freq: Every day | ORAL | 1 refills | Status: DC

## 2020-03-06 NOTE — Discharge Instructions (Addendum)
Take the Vicodin 1 pill 4 times a day as needed for pain.  Please follow-up with Dr. Rudene Christians the orthopedic surgeon to check and make sure that everything is beginning to heal properly.  The fractures look stable and will take some time to heal but there should not be any problem with displacement.  Please return here for any further problems.  Once the Vicodin is done you can try Motrin or Naprosyn.    You should follow-up with your neurologist as soon as possible.  I have restarted your Lamictal.  We have to to start at a low dose 25 mg a day.  I have given you a dose here in the emergency room and will try to give you a dose tomorrow.  For sleep you can try Lunesta 1 mg at bedtime as needed.  I would not take that until after you have made sure that the pain medicine is not making you too sleepy.  You do not want to get so sleepy that you stop breathing.

## 2020-03-06 NOTE — ED Notes (Signed)
Patient transported to CT 

## 2020-03-06 NOTE — ED Notes (Signed)
meds given   Pt to xray.

## 2020-03-06 NOTE — ED Notes (Signed)
md at bedside with pt again.
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
## 2020-03-06 NOTE — ED Notes (Signed)
Pt in hallway bed.  Seizure pads in place  Iv in place.  Pt alert  Dr Cinda Quest at bedside.
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
## 2020-03-07 ENCOUNTER — Inpatient Hospital Stay: Payer: 59 | Admitting: Anesthesiology

## 2020-03-07 ENCOUNTER — Encounter
Admission: EM | Disposition: A | Discharge status: Home / Self Care | Payer: Self-pay | Source: Home / Self Care | Attending: Internal Medicine

## 2020-03-07 ENCOUNTER — Other Ambulatory Visit: Payer: Self-pay

## 2020-03-07 ENCOUNTER — Inpatient Hospital Stay: Payer: 59

## 2020-03-07 ENCOUNTER — Encounter: Payer: Self-pay | Admitting: Internal Medicine

## 2020-03-07 DIAGNOSIS — S32000A Wedge compression fracture of unspecified lumbar vertebra, initial encounter for closed fracture: Secondary | ICD-10-CM | POA: Diagnosis present

## 2020-03-07 DIAGNOSIS — F418 Other specified anxiety disorders: Secondary | ICD-10-CM | POA: Diagnosis present

## 2020-03-07 DIAGNOSIS — Z800 Family history of malignant neoplasm of digestive organs: Secondary | ICD-10-CM | POA: Diagnosis not present

## 2020-03-07 DIAGNOSIS — K219 Gastro-esophageal reflux disease without esophagitis: Secondary | ICD-10-CM | POA: Diagnosis present

## 2020-03-07 DIAGNOSIS — D649 Anemia, unspecified: Secondary | ICD-10-CM | POA: Diagnosis present

## 2020-03-07 DIAGNOSIS — E876 Hypokalemia: Secondary | ICD-10-CM | POA: Diagnosis present

## 2020-03-07 DIAGNOSIS — Z801 Family history of malignant neoplasm of trachea, bronchus and lung: Secondary | ICD-10-CM | POA: Diagnosis not present

## 2020-03-07 DIAGNOSIS — G4700 Insomnia, unspecified: Secondary | ICD-10-CM | POA: Diagnosis present

## 2020-03-07 DIAGNOSIS — Z87891 Personal history of nicotine dependence: Secondary | ICD-10-CM | POA: Diagnosis not present

## 2020-03-07 DIAGNOSIS — Z808 Family history of malignant neoplasm of other organs or systems: Secondary | ICD-10-CM | POA: Diagnosis not present

## 2020-03-07 DIAGNOSIS — F5105 Insomnia due to other mental disorder: Secondary | ICD-10-CM | POA: Diagnosis present

## 2020-03-07 DIAGNOSIS — K2101 Gastro-esophageal reflux disease with esophagitis, with bleeding: Secondary | ICD-10-CM | POA: Diagnosis present

## 2020-03-07 DIAGNOSIS — M4854XA Collapsed vertebra, not elsewhere classified, thoracic region, initial encounter for fracture: Secondary | ICD-10-CM | POA: Diagnosis present

## 2020-03-07 DIAGNOSIS — T426X6A Underdosing of other antiepileptic and sedative-hypnotic drugs, initial encounter: Secondary | ICD-10-CM | POA: Diagnosis present

## 2020-03-07 DIAGNOSIS — M4856XA Collapsed vertebra, not elsewhere classified, lumbar region, initial encounter for fracture: Secondary | ICD-10-CM | POA: Diagnosis present

## 2020-03-07 DIAGNOSIS — D72829 Elevated white blood cell count, unspecified: Secondary | ICD-10-CM | POA: Diagnosis present

## 2020-03-07 DIAGNOSIS — K920 Hematemesis: Secondary | ICD-10-CM | POA: Diagnosis present

## 2020-03-07 DIAGNOSIS — I10 Essential (primary) hypertension: Secondary | ICD-10-CM | POA: Diagnosis present

## 2020-03-07 DIAGNOSIS — G40909 Epilepsy, unspecified, not intractable, without status epilepticus: Secondary | ICD-10-CM | POA: Diagnosis present

## 2020-03-07 DIAGNOSIS — D334 Benign neoplasm of spinal cord: Secondary | ICD-10-CM | POA: Diagnosis present

## 2020-03-07 DIAGNOSIS — K319 Disease of stomach and duodenum, unspecified: Secondary | ICD-10-CM | POA: Diagnosis present

## 2020-03-07 DIAGNOSIS — K449 Diaphragmatic hernia without obstruction or gangrene: Secondary | ICD-10-CM | POA: Diagnosis present

## 2020-03-07 DIAGNOSIS — Z7289 Other problems related to lifestyle: Secondary | ICD-10-CM | POA: Diagnosis not present

## 2020-03-07 DIAGNOSIS — S22000A Wedge compression fracture of unspecified thoracic vertebra, initial encounter for closed fracture: Secondary | ICD-10-CM | POA: Diagnosis not present

## 2020-03-07 DIAGNOSIS — Z91138 Patient's unintentional underdosing of medication regimen for other reason: Secondary | ICD-10-CM | POA: Diagnosis not present

## 2020-03-07 DIAGNOSIS — N179 Acute kidney failure, unspecified: Secondary | ICD-10-CM | POA: Diagnosis present

## 2020-03-07 DIAGNOSIS — Z20822 Contact with and (suspected) exposure to covid-19: Secondary | ICD-10-CM | POA: Diagnosis present

## 2020-03-07 DIAGNOSIS — E871 Hypo-osmolality and hyponatremia: Secondary | ICD-10-CM | POA: Diagnosis present

## 2020-03-07 DIAGNOSIS — F329 Major depressive disorder, single episode, unspecified: Secondary | ICD-10-CM | POA: Diagnosis present

## 2020-03-07 DIAGNOSIS — F5104 Psychophysiologic insomnia: Secondary | ICD-10-CM | POA: Diagnosis present

## 2020-03-07 DIAGNOSIS — R569 Unspecified convulsions: Secondary | ICD-10-CM | POA: Diagnosis not present

## 2020-03-07 HISTORY — PX: ESOPHAGOGASTRODUODENOSCOPY (EGD) WITH PROPOFOL: SHX5813

## 2020-03-07 LAB — BASIC METABOLIC PANEL
Anion gap: 13 (ref 5–15)
BUN: 32 mg/dL — ABNORMAL HIGH (ref 6–20)
CO2: 25 mmol/L (ref 22–32)
Calcium: 8.6 mg/dL — ABNORMAL LOW (ref 8.9–10.3)
Chloride: 95 mmol/L — ABNORMAL LOW (ref 98–111)
Creatinine, Ser: 1.09 mg/dL (ref 0.61–1.24)
GFR calc Af Amer: >60 mL/min (ref >60)
GFR calc non Af Amer: >60 mL/min (ref >60)
Glucose, Bld: 108 mg/dL — ABNORMAL HIGH (ref 70–99)
Potassium: 3.2 mmol/L — ABNORMAL LOW (ref 3.5–5.1)
Sodium: 133 mmol/L — ABNORMAL LOW (ref 135–145)

## 2020-03-07 LAB — HEPATIC FUNCTION PANEL
ALT: 44 U/L (ref 0–44)
AST: 48 U/L — ABNORMAL HIGH (ref 15–41)
Albumin: 4.2 g/dL (ref 3.5–5.0)
Alkaline Phosphatase: 83 U/L (ref 38–126)
Bilirubin, Direct: 0.2 mg/dL (ref 0.0–0.2)
Indirect Bilirubin: 1.7 mg/dL — ABNORMAL HIGH (ref 0.3–0.9)
Total Bilirubin: 1.9 mg/dL — ABNORMAL HIGH (ref 0.3–1.2)
Total Protein: 7.6 g/dL (ref 6.5–8.1)

## 2020-03-07 LAB — CBC
HCT: 38.8 % — ABNORMAL LOW (ref 39.0–52.0)
HCT: 36.7 % — ABNORMAL LOW (ref 39.0–52.0)
Hemoglobin: 13.7 g/dL (ref 13.0–17.0)
Hemoglobin: 12.8 g/dL — ABNORMAL LOW (ref 13.0–17.0)
MCH: 31 pg (ref 26.0–34.0)
MCH: 31.1 pg (ref 26.0–34.0)
MCHC: 35.3 g/dL (ref 30.0–36.0)
MCHC: 34.9 g/dL (ref 30.0–36.0)
MCV: 87.8 fL (ref 80.0–100.0)
MCV: 89.3 fL (ref 80.0–100.0)
Platelets: 205 10*3/uL (ref 150–400)
Platelets: 169 10*3/uL (ref 150–400)
RBC: 4.42 MIL/uL (ref 4.22–5.81)
RBC: 4.11 MIL/uL — ABNORMAL LOW (ref 4.22–5.81)
RDW: 12.1 % (ref 11.5–15.5)
RDW: 12.2 % (ref 11.5–15.5)
WBC: 10.7 10*3/uL — ABNORMAL HIGH (ref 4.0–10.5)
WBC: 7.9 10*3/uL (ref 4.0–10.5)
nRBC: 0 % (ref 0.0–0.2)
nRBC: 0 % (ref 0.0–0.2)

## 2020-03-07 LAB — APTT: aPTT: 24 seconds (ref 24–36)

## 2020-03-07 LAB — URINE DRUG SCREEN, QUALITATIVE (ARMC ONLY)
Amphetamines, Ur Screen: NOT DETECTED
Barbiturates, Ur Screen: NOT DETECTED
Benzodiazepine, Ur Scrn: POSITIVE — AB
Cannabinoid 50 Ng, Ur ~~LOC~~: NOT DETECTED
Cocaine Metabolite,Ur ~~LOC~~: NOT DETECTED
MDMA (Ecstasy)Ur Screen: NOT DETECTED
Methadone Scn, Ur: NOT DETECTED
Opiate, Ur Screen: POSITIVE — AB
Phencyclidine (PCP) Ur S: NOT DETECTED
Tricyclic, Ur Screen: NOT DETECTED

## 2020-03-07 LAB — TYPE AND SCREEN
ABO/RH(D): A POS
Antibody Screen: NEGATIVE

## 2020-03-07 LAB — PROTIME-INR
INR: 1.1 (ref 0.8–1.2)
Prothrombin Time: 13.4 seconds (ref 11.4–15.2)

## 2020-03-07 LAB — ETHANOL: Alcohol, Ethyl (B): <10 mg/dL (ref <10)

## 2020-03-07 LAB — SARS CORONAVIRUS 2 BY RT PCR (HOSPITAL ORDER, PERFORMED IN ~~LOC~~ HOSPITAL LAB): SARS Coronavirus 2: NEGATIVE

## 2020-03-07 SURGERY — ESOPHAGOGASTRODUODENOSCOPY (EGD) WITH PROPOFOL
Anesthesia: General

## 2020-03-07 MED ORDER — MORPHINE SULFATE (PF) 2 MG/ML IV SOLN: 2.0000 mg | INTRAVENOUS | Status: DC | PRN

## 2020-03-07 MED ORDER — GADOBUTROL 1 MMOL/ML IV SOLN
8.0000 mL | Freq: Once | INTRAVENOUS | Status: AC | PRN
  Administered 2020-03-07: 8 mL via INTRAVENOUS

## 2020-03-07 MED ORDER — PANTOPRAZOLE SODIUM 40 MG IV SOLR: 40.0000 mg | Freq: Two times a day (BID) | INTRAVENOUS | Status: DC

## 2020-03-07 MED ORDER — LAMOTRIGINE 25 MG PO TABS
25.0000 mg | ORAL_TABLET | Freq: Every day | ORAL | Status: DC
  Administered 2020-03-08 – 2020-03-09 (×2): 25 mg via ORAL
  Filled 2020-03-07 (×2): qty 1

## 2020-03-07 MED ORDER — PROPOFOL 500 MG/50ML IV EMUL
INTRAVENOUS | Status: DC | PRN
  Administered 2020-03-07: 150 ug/kg/min via INTRAVENOUS

## 2020-03-07 MED ORDER — SODIUM CHLORIDE 0.9 % IV SOLN
8.0000 mg/h | INTRAVENOUS | Status: DC
  Filled 2020-03-07: qty 80

## 2020-03-07 MED ORDER — FOLIC ACID 1 MG PO TABS
1.0000 mg | ORAL_TABLET | Freq: Every day | ORAL | Status: DC
  Administered 2020-03-07 – 2020-03-09 (×3): 1 mg via ORAL
  Filled 2020-03-07 (×3): qty 1

## 2020-03-07 MED ORDER — ONDANSETRON 4 MG PO TBDP
4.0000 mg | ORAL_TABLET | Freq: Three times a day (TID) | ORAL | Status: DC | PRN
  Administered 2020-03-07: 4 mg via ORAL
  Filled 2020-03-07 (×2): qty 1

## 2020-03-07 MED ORDER — ADULT MULTIVITAMIN W/MINERALS CH
1.0000 | ORAL_TABLET | Freq: Every day | ORAL | Status: DC
  Administered 2020-03-07 – 2020-03-09 (×3): 1 via ORAL
  Filled 2020-03-07 (×3): qty 1

## 2020-03-07 MED ORDER — PROPOFOL 10 MG/ML IV BOLUS
INTRAVENOUS | Status: DC | PRN
  Administered 2020-03-07 (×2): 100 mg via INTRAVENOUS

## 2020-03-07 MED ORDER — THIAMINE HCL 100 MG PO TABS
100.0000 mg | ORAL_TABLET | Freq: Every day | ORAL | Status: DC
  Administered 2020-03-07 – 2020-03-09 (×3): 100 mg via ORAL
  Filled 2020-03-07 (×3): qty 1

## 2020-03-07 MED ORDER — POTASSIUM CHLORIDE CRYS ER 20 MEQ PO TBCR: 40.0000 meq | EXTENDED_RELEASE_TABLET | Freq: Once | ORAL | Status: DC

## 2020-03-07 MED ORDER — LORATADINE 10 MG PO TABS
10.0000 mg | ORAL_TABLET | Freq: Every evening | ORAL | Status: DC
  Administered 2020-03-07: 10 mg via ORAL
  Filled 2020-03-07 (×2): qty 1

## 2020-03-07 MED ORDER — ALPRAZOLAM 0.5 MG PO TABS
1.0000 mg | ORAL_TABLET | Freq: Three times a day (TID) | ORAL | Status: DC | PRN
  Administered 2020-03-07: 1 mg via ORAL
  Filled 2020-03-07: qty 2

## 2020-03-07 MED ORDER — LIDOCAINE 2% (20 MG/ML) 5 ML SYRINGE
INTRAMUSCULAR | Status: DC | PRN
  Administered 2020-03-07: 100 mg via INTRAVENOUS

## 2020-03-07 MED ORDER — LORAZEPAM 2 MG/ML IJ SOLN: 0.0000 mg | Freq: Two times a day (BID) | INTRAMUSCULAR | Status: DC

## 2020-03-07 MED ORDER — LAMOTRIGINE 100 MG PO TABS
100.0000 mg | ORAL_TABLET | Freq: Two times a day (BID) | ORAL | Status: DC
  Administered 2020-03-07: 100 mg via ORAL
  Filled 2020-03-07: qty 1

## 2020-03-07 MED ORDER — ONDANSETRON 4 MG PO TBDP
4.0000 mg | ORAL_TABLET | Freq: Once | ORAL | Status: AC
  Administered 2020-03-07: 4 mg via ORAL

## 2020-03-07 MED ORDER — SODIUM CHLORIDE 0.9 % IV SOLN
80.0000 mg | Freq: Once | INTRAVENOUS | Status: AC
  Administered 2020-03-07: 80 mg via INTRAVENOUS
  Filled 2020-03-07: qty 80

## 2020-03-07 MED ORDER — LORAZEPAM 2 MG/ML IJ SOLN: 1.0000 mg | INTRAMUSCULAR | Status: DC | PRN

## 2020-03-07 MED ORDER — AMLODIPINE BESYLATE 5 MG PO TABS
2.5000 mg | ORAL_TABLET | Freq: Every day | ORAL | Status: DC
  Administered 2020-03-08 – 2020-03-09 (×2): 2.5 mg via ORAL
  Filled 2020-03-07 (×2): qty 1

## 2020-03-07 MED ORDER — HYDRALAZINE HCL 20 MG/ML IJ SOLN: 5.0000 mg | INTRAMUSCULAR | Status: DC | PRN

## 2020-03-07 MED ORDER — SODIUM CHLORIDE 0.9 % IV SOLN: INTRAVENOUS | Status: DC

## 2020-03-07 MED ORDER — GLYCOPYRROLATE 0.2 MG/ML IJ SOLN
INTRAMUSCULAR | Status: DC | PRN
  Administered 2020-03-07: .2 mg via INTRAVENOUS

## 2020-03-07 MED ORDER — ACETAMINOPHEN 325 MG PO TABS: 650.0000 mg | ORAL_TABLET | Freq: Four times a day (QID) | ORAL | Status: DC | PRN

## 2020-03-07 MED ORDER — THIAMINE HCL 100 MG/ML IJ SOLN
100.0000 mg | Freq: Every day | INTRAMUSCULAR | Status: DC
  Filled 2020-03-07: qty 2

## 2020-03-07 MED ORDER — OXYCODONE-ACETAMINOPHEN 5-325 MG PO TABS
1.0000 | ORAL_TABLET | Freq: Four times a day (QID) | ORAL | Status: DC | PRN
  Administered 2020-03-07: 1 via ORAL
  Filled 2020-03-07: qty 1

## 2020-03-07 MED ORDER — METHOCARBAMOL 500 MG PO TABS
500.0000 mg | ORAL_TABLET | Freq: Three times a day (TID) | ORAL | Status: DC | PRN
  Filled 2020-03-07: qty 1

## 2020-03-07 MED ORDER — PANTOPRAZOLE SODIUM 40 MG PO TBEC
40.0000 mg | DELAYED_RELEASE_TABLET | Freq: Two times a day (BID) | ORAL | Status: DC
  Administered 2020-03-07 – 2020-03-09 (×4): 40 mg via ORAL
  Filled 2020-03-07 (×4): qty 1

## 2020-03-07 MED ORDER — POTASSIUM CHLORIDE CRYS ER 20 MEQ PO TBCR
20.0000 meq | EXTENDED_RELEASE_TABLET | Freq: Once | ORAL | Status: AC
  Administered 2020-03-07: 20 meq via ORAL
  Filled 2020-03-07: qty 1

## 2020-03-07 MED ORDER — PANTOPRAZOLE SODIUM 40 MG IV SOLR: 40.0000 mg | Freq: Once | INTRAVENOUS | Status: DC

## 2020-03-07 MED ORDER — LORAZEPAM 1 MG PO TABS: 1.0000 mg | ORAL_TABLET | ORAL | Status: DC | PRN

## 2020-03-07 MED ORDER — LORAZEPAM 2 MG/ML IJ SOLN
0.0000 mg | Freq: Four times a day (QID) | INTRAMUSCULAR | Status: DC
  Administered 2020-03-07: 1 mg via INTRAVENOUS
  Filled 2020-03-07: qty 1

## 2020-03-07 NOTE — Transfer of Care (Signed)
Immediate Anesthesia Transfer of Care Note  Patient: Donald Jenkins  Procedure(s) Performed: ESOPHAGOGASTRODUODENOSCOPY (EGD) WITH PROPOFOL (N/A )  Patient Location: Endoscopy Unit  Anesthesia Type:General  Level of Consciousness: awake  Airway & Oxygen Therapy: Patient connected to nasal cannula oxygen  Post-op Assessment: Post -op Vital signs reviewed and stable  Post vital signs: stable  Last Vitals:  Vitals Value Taken Time  BP 101/61 03/07/20 1525  Temp 36.6 C 03/07/20 1524  Pulse 95 03/07/20 1526  Resp 20 03/07/20 1526  SpO2 95 % 03/07/20 1526  Vitals shown include unvalidated device data.  Last Pain:  Vitals:   03/07/20 1524  TempSrc: Temporal  PainSc: Asleep         Complications: No complications documented.

## 2020-03-07 NOTE — Anesthesia Preprocedure Evaluation (Addendum)
Anesthesia Evaluation  Patient identified by MRN, date of birth, ID band Patient awake    Reviewed: Allergy & Precautions, H&P , NPO status , reviewed documented beta blocker date and time   Airway Mallampati: II  TM Distance: >3 FB Neck ROM: full    Dental  (+) Chipped   Pulmonary former smoker,    Pulmonary exam normal        Cardiovascular hypertension, Normal cardiovascular exam     Neuro/Psych Seizures -,  PSYCHIATRIC DISORDERS Anxiety Depression Seizure PTA  Neuromuscular disease    GI/Hepatic hiatal hernia, GERD  Controlled,  Endo/Other    Renal/GU Renal disease     Musculoskeletal   Abdominal   Peds  Hematology   Anesthesia Other Findings Past Medical History: No date: Anxiety     Comment:  social 2003: Hiatal hernia No date: Hypertension No date: T8 vertebral fracture ([facility]) No date: Tachycardia No date: Tumors     Comment:  on spine  Past Surgical History: 06/05/2018: CHOLECYSTECTOMY; N/A     Comment:  Procedure: LAPAROSCOPIC CHOLECYSTECTOMY WITH POSSIBLE               INTRAOPERATIVE CHOLANGIOGRAM;  Surgeon: Clovis Riley, MD;  Location: [place];  Service: General;  Laterality:               N/A; 12/13/2018: IR VERTEBROPLASTY CERV/THOR BX INC UNI/BIL INC/INJECT/ IMAGING No date: KNEE SURGERY; Left     Comment:  in 8th grade; acl repair  BMI    Body Mass Index: 25.09 kg/m      Reproductive/Obstetrics                            Anesthesia Physical Anesthesia Plan  ASA: III  Anesthesia Plan: General   Post-op Pain Management:    Induction: Intravenous  PONV Risk Score and Plan: Treatment may vary due to age or medical condition and TIVA  Airway Management Planned: Nasal Cannula and Natural Airway  Additional Equipment:   Intra-op Plan:   Post-operative Plan:   Informed Consent: I have reviewed the patients History and Physical, chart, labs  and discussed the procedure including the risks, benefits and alternatives for the proposed anesthesia with the patient or authorized representative who has indicated his/her understanding and acceptance.     Dental Advisory Given  Plan Discussed with: CRNA  Anesthesia Plan Comments:         Anesthesia Quick Evaluation

## 2020-03-07 NOTE — Op Note (Signed)
[facility] Patient Name: Donald Jenkins Procedure Date: 03/07/2020 2:58 PM MRN: [id number] Account #: [id number] Date of Birth: 01-27-1971 Admit Type: Inpatient Age: 49 Room: [facility] ENDO ROOM 3 Gender: Male Note Status: Finalized Procedure:             Upper GI endoscopy Indications:           Acute post hemorrhagic anemia, Coffee-ground emesis Providers:             Lin Landsman MD, MD Referring MD:          Shirline Frees MD, MD (Referring MD) Medicines:             Monitored Anesthesia Care Complications:         No immediate complications. Estimated blood loss: None. Procedure:             Pre-Anesthesia Assessment:                        - Prior to the procedure, a History and Physical was                         performed, and patient medications and allergies were                         reviewed. The patient is competent. The risks and                         benefits of the procedure and the sedation options and                         risks were discussed with the patient. All questions                         were answered and informed consent was obtained.                         Patient identification and proposed procedure were                         verified by the physician, the nurse, the                         anesthesiologist, the anesthetist and the technician                         in the pre-procedure area in the procedure room in the                         endoscopy suite. Mental Status Examination: alert and                         oriented. Airway Examination: normal oropharyngeal                         airway and neck mobility. Respiratory Examination:                         clear to auscultation. CV Examination: normal.  Prophylactic Antibiotics: The patient does not require                         prophylactic antibiotics. Prior Anticoagulants: The                         patient has  taken no previous anticoagulant or                         antiplatelet agents. ASA Grade Assessment: III - A                         patient with severe systemic disease. After reviewing                         the risks and benefits, the patient was deemed in                         satisfactory condition to undergo the procedure. The                         anesthesia plan was to use monitored anesthesia care                         (MAC). Immediately prior to administration of                         medications, the patient was re-assessed for adequacy                         to receive sedatives. The heart rate, respiratory                         rate, oxygen saturations, blood pressure, adequacy of                         pulmonary ventilation, and response to care were                         monitored throughout the procedure. The physical                         status of the patient was re-assessed after the                         procedure.                        After obtaining informed consent, the endoscope was                         passed under direct vision. Throughout the procedure,                         the patient's blood pressure, pulse, and oxygen                         saturations were monitored continuously. The Endoscope  was introduced through the mouth, and advanced to the                         second part of duodenum. The upper GI endoscopy was                         accomplished without difficulty. The patient tolerated                         the procedure well. Findings:      The duodenal bulb and second portion of the duodenum were normal.      A few dispersed diminutive erosions with no bleeding and no stigmata of       recent bleeding were found in the gastric antrum. Biopsies were taken       with a cold forceps for Helicobacter pylori testing.      A 3 cm hiatal hernia was present.      The cardia and gastric fundus were  normal on retroflexion.      Striped mildly erythematous mucosa without bleeding was found in the       gastric body and in the gastric antrum.      LA Grade B (one or more mucosal breaks greater than 5 mm, not extending       between the tops of two mucosal folds) esophagitis with no bleeding was       found in the lower third of the esophagus. Impression:            - Normal duodenal bulb and second portion of the                         duodenum.                        - Erosive gastropathy with no bleeding and no stigmata                         of recent bleeding. Biopsied.                        - 3 cm hiatal hernia.                        - Erythematous mucosa in the gastric body and antrum.                        - LA Grade B reflux esophagitis with no bleeding. Recommendation:        - Return patient to hospital ward for ongoing care.                        - Resume previous diet today.                        - Continue present medications.                        - Use Prilosec (omeprazole) 40 mg PO daily long term                         due to presence  of hiatal hernia.                        - Follow an antireflux regimen indefinitely.                        - Await pathology results. Procedure Code(s):     --- Professional ---                        [phone], Esophagogastroduodenoscopy, flexible,                         transoral; with biopsy, single or multiple Diagnosis Code(s):     --- Professional ---                        K31.89, Other diseases of stomach and duodenum                        K44.9, Diaphragmatic hernia without obstruction or                         gangrene                        K21.00, Gastro-esophageal reflux disease with                         esophagitis, without bleeding                        D62, Acute posthemorrhagic anemia                        K92.0, Hematemesis CPT copyright 2019 [employer]. All rights reserved. The codes  documented in this report are preliminary and upon coder review may  be revised to meet current compliance requirements. Dr. Ulyess Mort Lin Landsman MD, MD 03/07/2020 3:23:44 PM This report has been signed electronically. Number of Addenda: 0 Note Initiated On: 03/07/2020 2:58 PM Estimated Blood Loss:  Estimated blood loss: none.      [facility]

## 2020-03-07 NOTE — Progress Notes (Signed)
OT Cancellation Note  Patient Details Name: Donald Jenkins MRN: [id number] DOB: Feb 17, 1971   Cancelled Treatment:    Reason Eval/Treat Not Completed: Patient not medically ready. Consult received, chart reviewed. Pt pending orthopedic consult and gastroenterology consult pending. Will hold OT evaluation at this time and re-attempt at later date/time pending consults and updated plan of care.   Jeni Salles, MPH, MS, OTR/L ascom [phone] 03/07/20, 10:18 AM

## 2020-03-07 NOTE — ED Notes (Signed)
Patient transported to endoscopy. 

## 2020-03-07 NOTE — Progress Notes (Signed)
PT Cancellation Note  Patient Details Name: Donald Jenkins MRN: [id number] DOB: [birth year]   Cancelled Treatment:    Reason Eval/Treat Not Completed: Patient at procedure or test/unavailable Pt out of room for endoscopy, will attempt to eval tomorrow as appropriate.  Kreg Shropshire, DPT 03/07/2020, 4:02 PM
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
## 2020-03-07 NOTE — Consult Note (Signed)
Referring Physician:  No referring provider defined for this encounter.  Primary Physician:  Antony Blackbird, MD  Chief Complaint:  T and L spine fracture  History of Present Illness: 03/07/2020 Donald Jenkins is a 49 y.o. male who presents with the chief complaint of seizure causing probable acute spine fractures.  He has no new neurologic symptoms otherwise.  He has no weakness.  He has no bowel or bladder complaints.    He suffered a seizure in the setting of noncompliance with medications.  He was previously managed at [facility].  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  Review of Systems:  A 10 point review of systems is negative, except for the pertinent positives and negatives detailed in the HPI.  Past Medical History: Past Medical History:  Diagnosis Date  . Anxiety    social  . Hiatal hernia 2003  . Hypertension   . T8 vertebral fracture ([place])   . Tachycardia   . Tumors    on spine    Past Surgical History: Past Surgical History:  Procedure Laterality Date  . CHOLECYSTECTOMY N/A 06/05/2018   Procedure: LAPAROSCOPIC CHOLECYSTECTOMY WITH POSSIBLE INTRAOPERATIVE CHOLANGIOGRAM;  Surgeon: Clovis Riley, MD;  Location: [facility] OR;  Service: General;  Laterality: N/A;  . IR VERTEBROPLASTY CERV/THOR BX INC UNI/BIL INC/INJECT/IMAGING  12/13/2018  . KNEE SURGERY Left    in 8th grade; acl repair    Allergies: Allergies as of 03/06/2020 - Review Complete 03/06/2020  Allergen Reaction Noted  . Penicillins Other (See Comments) 10/10/2012    Medications:  Current Facility-Administered Medications:  .  0.9 %  sodium chloride infusion, , Intravenous, Continuous, Ivor Costa, MD, Last Rate: 125 mL/hr at 03/07/20 1457, Continued from Pre-op at 03/07/20 1457 .  0.9 %  sodium chloride infusion, , Intravenous, Continuous, Vanga, Tally Due, MD .  acetaminophen (TYLENOL) tablet 650 mg, 650 mg, Oral, Q6H PRN, Ivor Costa, MD .   ALPRAZolam Duanne Moron) tablet 1 mg, 1 mg, Oral, TID PRN, Ivor Costa, MD .  amLODipine (NORVASC) tablet 2.5 mg, 2.5 mg, Oral, Daily, Ivor Costa, MD .  folic acid (FOLVITE) tablet 1 mg, 1 mg, Oral, Daily, Ivor Costa, MD, 1 mg at 03/07/20 0905 .  hydrALAZINE (APRESOLINE) injection 5 mg, 5 mg, Intravenous, Q2H PRN, Ivor Costa, MD .  lamoTRIgine (LAMICTAL) tablet 100 mg, 100 mg, Oral, BID, Ivor Costa, MD, 100 mg at 03/07/20 0905 .  loratadine (CLARITIN) tablet 10 mg, 10 mg, Oral, QPM, Ivor Costa, MD .  LORazepam (ATIVAN) injection 0-4 mg, 0-4 mg, Intravenous, Q6H, 1 mg at 03/07/20 0907 **FOLLOWED BY** [START ON 03/09/2020] LORazepam (ATIVAN) injection 0-4 mg, 0-4 mg, Intravenous, Q12H, Ivor Costa, MD .  LORazepam (ATIVAN) injection 1 mg, 1 mg, Intravenous, Q2H PRN, Ivor Costa, MD .  LORazepam (ATIVAN) tablet 1-4 mg, 1-4 mg, Oral, Q1H PRN **OR** LORazepam (ATIVAN) injection 1-4 mg, 1-4 mg, Intravenous, Q1H PRN, Ivor Costa, MD .  methocarbamol (ROBAXIN) tablet 500 mg, 500 mg, Oral, Q8H PRN, Ivor Costa, MD .  morphine 2 MG/ML injection 2 mg, 2 mg, Intravenous, Q4H PRN, Ivor Costa, MD .  multivitamin with minerals tablet 1 tablet, 1 tablet, Oral, Daily, Ivor Costa, MD, 1 tablet at 03/07/20 0906 .  ondansetron (ZOFRAN-ODT) disintegrating tablet 4 mg, 4 mg, Oral, Q8H PRN, Ivor Costa, MD, 4 mg at 03/07/20 0912 .  oxyCODONE-acetaminophen (PERCOCET/ROXICET) 5-325 MG per tablet 1 tablet, 1 tablet, Oral, Q6H PRN, Ivor Costa, MD, 1 tablet at  03/07/20 0932 .  pantoprazole (PROTONIX) EC tablet 40 mg, 40 mg, Oral, BID AC, Vanga, Tally Due, MD .  potassium chloride SA (KLOR-CON) CR tablet 40 mEq, 40 mEq, Oral, Once, Ivor Costa, MD .  thiamine tablet 100 mg, 100 mg, Oral, Daily, 100 mg at 03/07/20 0905 **OR** thiamine (B-1) injection 100 mg, 100 mg, Intravenous, Daily, Ivor Costa, MD .  zolpidem (AMBIEN) tablet 5 mg, 5 mg, Oral, QHS PRN, Ivor Costa, MD, 5 mg at 03/06/20 2229   Social History: Social History   Tobacco  Use  . Smoking status: Former Research scientist (life sciences)  . Smokeless tobacco: Never Used  Vaping Use  . Vaping Use: Never used  Substance Use Topics  . Alcohol use: Yes    Alcohol/week: 0.0 standard drinks    Comment: ocassionally   . Drug use: Not Currently    Types: Marijuana    Family Medical History: Family History  Problem Relation Age of Onset  . Lung cancer Father   . Esophageal cancer Father   . Brain cancer Father     Physical Examination: Vitals:   03/07/20 1544 03/07/20 1653  BP: (!) 146/89 132/79  Pulse:  71  Resp:  16  Temp:  98 F (36.7 C)  SpO2:  98%     General: Patient is well developed, well nourished, calm, collected, and in no apparent distress.  Psychiatric: Patient is non-anxious.  Head:  Pupils equal, round, and reactive to light.  ENT:  Oral mucosa appears well hydrated.  Neck:   Supple.  Full range of motion.  Respiratory: Patient is breathing without any difficulty.  Extremities: No edema.  Vascular: Palpable pulses in dorsal pedal vessels.  Skin:   On exposed skin, there are no abnormal skin lesions.  NEUROLOGICAL:  General: In no acute distress.   Awake, alert, oriented to person, place, and time.  Pupils equal round and reactive to light.  Facial tone is symmetric.  Tongue protrusion is midline.  There is no pronator drift.  Strength: Side Biceps Triceps Deltoid Interossei Grip Wrist Ext. Wrist Flex.  R 5 5 5 5 5 5 5   L 5 5 5 5 5 5 5    Side Iliopsoas Quads Hamstring PF DF EHL  R 5 5 5 5 5 5   L 5 5 5 5 5 5    Reflexes are 1+ and symmetric at the biceps, triceps, brachioradialis, patella and achilles.   Bilateral upper and lower extremity sensation is intact to light touch and pin prick.  Clonus is not present.  Gait is untested.  Hoffman's is absent.  Imaging: CT TL spine 03/06/20 IMPRESSION: Compression fractures of L2 and L4 with mild loss of height and no significant osseous retropulsion. Mildly displaced fracture of right L1 and L2  transverse processes. There is evidence of some bridging bone at the L2 transverse process. Therefore some or all of these fractures may be subacute.   Electronically Signed   By: Macy Mis M.D.   On: 03/06/2020 18:01  I have personally reviewed the images and agree with the above interpretation.  Labs: CBC Latest Ref Rng & Units 03/07/2020 03/06/2020 06/15/2019  WBC 4.0 - 10.5 K/uL 10.7(H) 13.5(H) 6.4  Hemoglobin 13.0 - 17.0 g/dL 13.7 16.0 16.2  Hematocrit 39 - 52 % 38.8(L) 48.5 48.7  Platelets 150 - 400 K/uL 205 280 239       Assessment and Plan: Mr. Gaunt is a pleasant 49 y.o. male with lumbar and thoracic compression fractures, and known intraspinal  lesion.  - MRI T spine and L spine (w/contrast) - Consult neurology for recs regarding seizure medications - Will order LSO.  Wear when [place].   Janah Mcculloh K. Izora Ribas MD, [place] Dept. of Neurosurgery

## 2020-03-07 NOTE — ED Provider Notes (Signed)
Patient had been here overnight waiting to be discharged to the care of his mother this morning when had an episode of coffee-ground emesis.  Patient denies NSAID use, history of peptic ulcers disease, alcohol use, melena.  Patient remains hemodynamically stable.  Will start patient on Protonix bolus and drip.  Repeat H&H, get Covid swab and admit to the hospitalist service for endoscopy.   Alfred Levins, [place], MD 03/07/20 [phone]
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
## 2020-03-07 NOTE — ED Notes (Signed)
Hourly rounding reveals patient in room sitting on bed. No complaints, stable, in no acute distress.

## 2020-03-07 NOTE — Anesthesia Postprocedure Evaluation (Signed)
Anesthesia Post Note  Patient: DIXIE JAFRI  Procedure(s) Performed: ESOPHAGOGASTRODUODENOSCOPY (EGD) WITH PROPOFOL (N/A )  Patient location during evaluation: Endoscopy Anesthesia Type: General Level of consciousness: awake and alert Pain management: pain level controlled Vital Signs Assessment: post-procedure vital signs reviewed and stable Respiratory status: spontaneous breathing, nonlabored ventilation, respiratory function stable and patient connected to nasal cannula oxygen Cardiovascular status: blood pressure returned to baseline and stable Postop Assessment: no apparent nausea or vomiting Anesthetic complications: no   No complications documented.   Last Vitals:  Vitals:   03/07/20 1544 03/07/20 1653  BP: (!) 146/89 132/79  Pulse:  71  Resp:  16  Temp:  36.7 C  SpO2:  98%    Last Pain:  Vitals:   03/07/20 1653  TempSrc: Oral  PainSc:                  Martha Clan

## 2020-03-08 ENCOUNTER — Inpatient Hospital Stay: Payer: 59

## 2020-03-08 ENCOUNTER — Encounter: Payer: Self-pay | Admitting: Gastroenterology

## 2020-03-08 DIAGNOSIS — E876 Hypokalemia: Secondary | ICD-10-CM

## 2020-03-08 DIAGNOSIS — G4700 Insomnia, unspecified: Secondary | ICD-10-CM

## 2020-03-08 DIAGNOSIS — S32000A Wedge compression fracture of unspecified lumbar vertebra, initial encounter for closed fracture: Secondary | ICD-10-CM

## 2020-03-08 DIAGNOSIS — S22000A Wedge compression fracture of unspecified thoracic vertebra, initial encounter for closed fracture: Secondary | ICD-10-CM

## 2020-03-08 DIAGNOSIS — Z7289 Other problems related to lifestyle: Secondary | ICD-10-CM

## 2020-03-08 DIAGNOSIS — K219 Gastro-esophageal reflux disease without esophagitis: Secondary | ICD-10-CM

## 2020-03-08 DIAGNOSIS — F418 Other specified anxiety disorders: Secondary | ICD-10-CM

## 2020-03-08 DIAGNOSIS — I10 Essential (primary) hypertension: Secondary | ICD-10-CM

## 2020-03-08 DIAGNOSIS — K920 Hematemesis: Secondary | ICD-10-CM

## 2020-03-08 DIAGNOSIS — N179 Acute kidney failure, unspecified: Secondary | ICD-10-CM

## 2020-03-08 DIAGNOSIS — R569 Unspecified convulsions: Secondary | ICD-10-CM

## 2020-03-08 DIAGNOSIS — D72829 Elevated white blood cell count, unspecified: Secondary | ICD-10-CM

## 2020-03-08 LAB — HEPATIC FUNCTION PANEL
ALT: 34 U/L (ref 0–44)
AST: 33 U/L (ref 15–41)
Albumin: 3.6 g/dL (ref 3.5–5.0)
Alkaline Phosphatase: 67 U/L (ref 38–126)
Bilirubin, Direct: 0.1 mg/dL (ref 0.0–0.2)
Indirect Bilirubin: 1 mg/dL — ABNORMAL HIGH (ref 0.3–0.9)
Total Bilirubin: 1.1 mg/dL (ref 0.3–1.2)
Total Protein: 6.5 g/dL (ref 6.5–8.1)

## 2020-03-08 LAB — BASIC METABOLIC PANEL
Anion gap: 10 (ref 5–15)
BUN: 16 mg/dL (ref 6–20)
CO2: 26 mmol/L (ref 22–32)
Calcium: 8.2 mg/dL — ABNORMAL LOW (ref 8.9–10.3)
Chloride: 100 mmol/L (ref 98–111)
Creatinine, Ser: 0.97 mg/dL (ref 0.61–1.24)
GFR calc Af Amer: >60 mL/min (ref >60)
GFR calc non Af Amer: >60 mL/min (ref >60)
Glucose, Bld: 99 mg/dL (ref 70–99)
Potassium: 3.3 mmol/L — ABNORMAL LOW (ref 3.5–5.1)
Sodium: 136 mmol/L (ref 135–145)

## 2020-03-08 LAB — CBC
HCT: 32.7 % — ABNORMAL LOW (ref 39.0–52.0)
HCT: 34.9 % — ABNORMAL LOW (ref 39.0–52.0)
HCT: 34.4 % — ABNORMAL LOW (ref 39.0–52.0)
Hemoglobin: 11.4 g/dL — ABNORMAL LOW (ref 13.0–17.0)
Hemoglobin: 12 g/dL — ABNORMAL LOW (ref 13.0–17.0)
Hemoglobin: 12.5 g/dL — ABNORMAL LOW (ref 13.0–17.0)
MCH: 31.2 pg (ref 26.0–34.0)
MCH: 30.8 pg (ref 26.0–34.0)
MCH: 31.5 pg (ref 26.0–34.0)
MCHC: 34.9 g/dL (ref 30.0–36.0)
MCHC: 34.4 g/dL (ref 30.0–36.0)
MCHC: 36.3 g/dL — ABNORMAL HIGH (ref 30.0–36.0)
MCV: 89.6 fL (ref 80.0–100.0)
MCV: 89.5 fL (ref 80.0–100.0)
MCV: 86.6 fL (ref 80.0–100.0)
Platelets: 166 10*3/uL (ref 150–400)
Platelets: 170 10*3/uL (ref 150–400)
Platelets: 177 10*3/uL (ref 150–400)
RBC: 3.65 MIL/uL — ABNORMAL LOW (ref 4.22–5.81)
RBC: 3.9 MIL/uL — ABNORMAL LOW (ref 4.22–5.81)
RBC: 3.97 MIL/uL — ABNORMAL LOW (ref 4.22–5.81)
RDW: 12.2 % (ref 11.5–15.5)
RDW: 12.1 % (ref 11.5–15.5)
RDW: 12.1 % (ref 11.5–15.5)
WBC: 7.1 10*3/uL (ref 4.0–10.5)
WBC: 6.5 10*3/uL (ref 4.0–10.5)
WBC: 7.5 10*3/uL (ref 4.0–10.5)
nRBC: 0 % (ref 0.0–0.2)
nRBC: 0 % (ref 0.0–0.2)
nRBC: 0 % (ref 0.0–0.2)

## 2020-03-08 LAB — GLUCOSE, CAPILLARY: Glucose-Capillary: 89 mg/dL (ref 70–99)

## 2020-03-08 LAB — PHOSPHORUS: Phosphorus: 2.7 mg/dL (ref 2.5–4.6)

## 2020-03-08 LAB — MAGNESIUM: Magnesium: 2.2 mg/dL (ref 1.7–2.4)

## 2020-03-08 MED ORDER — POTASSIUM CHLORIDE CRYS ER 20 MEQ PO TBCR
40.0000 meq | EXTENDED_RELEASE_TABLET | Freq: Two times a day (BID) | ORAL | Status: AC
  Administered 2020-03-08 (×2): 40 meq via ORAL
  Filled 2020-03-08 (×2): qty 2

## 2020-03-08 MED ORDER — LEVETIRACETAM 500 MG PO TABS: 500.0000 mg | ORAL_TABLET | Freq: Two times a day (BID) | ORAL | Status: DC

## 2020-03-08 MED ORDER — LEVETIRACETAM IN NACL 1000 MG/100ML IV SOLN
1000.0000 mg | Freq: Once | INTRAVENOUS | Status: AC
  Administered 2020-03-08: 1000 mg via INTRAVENOUS
  Filled 2020-03-08: qty 100

## 2020-03-08 MED ORDER — LEVETIRACETAM 500 MG PO TABS
500.0000 mg | ORAL_TABLET | Freq: Two times a day (BID) | ORAL | Status: DC
  Administered 2020-03-08 – 2020-03-09 (×2): 500 mg via ORAL
  Filled 2020-03-08 (×2): qty 1

## 2020-03-08 MED ORDER — SODIUM CHLORIDE 0.9 % IV SOLN: INTRAVENOUS | Status: DC

## 2020-03-08 NOTE — Progress Notes (Signed)
EEG completed, results pending. 
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
## 2020-03-08 NOTE — Procedures (Signed)
Patient Name: Donald Jenkins  MRN: [id number]  Epilepsy Attending: Lora Havens  Referring Physician/Provider: Dr. Anson Fret Date: 03/08/1020 Duration: 26.17 minutes  Patient history: 49 year old male who presented with seizures.  EEG to evaluate for seizures.  Level of alertness: Awake,asleep  AEDs during EEG study: Keppra, lamotrigine, Ativan  Technical aspects: This EEG study was done with scalp electrodes positioned according to the 10-20 International system of electrode placement. Electrical activity was acquired at a sampling rate of 500Hz  and reviewed with a high frequency filter of 70Hz  and a low frequency filter of 1Hz . EEG data were recorded continuously and digitally stored.   Description: The posterior dominant rhythm consists of 8 Hz activity of moderate voltage (25-35 uV) seen predominantly in posterior head regions, symmetric and reactive to eye opening and eye closing. Sleep was characterized by vertex waves, sleep spindles (12 to 14 Hz), maximal frontocentral region.  Hyperventilation did not show any EEG change.  Physiologic photic driving was seen during photic stimulation.   IMPRESSION: This study is within normal limits. No seizures or epileptiform discharges were seen throughout the recording.  Donald Jenkins
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
## 2020-03-08 NOTE — Progress Notes (Signed)
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 of the right L2 transverse process with evidence of some bridging bone. There is also a mildly displaced fracture of the right L1 transverse process. Paraspinal and other soft tissues: Unremarkable. Disc levels: Minor degenerative changes.  No high-grade stenosis. IMPRESSION: Compression fractures of L2 and L4 with mild loss of height and no significant osseous retropulsion. Mildly displaced fracture of right L1 and L2 transverse processes. There is evidence of some bridging bone at the L2 transverse process. Therefore some or all of these fractures may be subacute. Electronically Signed   By: Macy Mis M.D.   On: 03/06/2020 18:01   MR THORACIC SPINE WO CONTRAST  Result Date: 03/07/2020 CLINICAL DATA:  Initial evaluation for compression fracture. EXAM: MRI THORACIC AND LUMBAR SPINE WITHOUT AND WITH CONTRAST TECHNIQUE:  Multiplanar and multiecho pulse sequences of the thoracic and lumbar spine were obtained without and with intravenous contrast. CONTRAST:  6mL GADAVIST GADOBUTROL 1 MMOL/ML IV SOLN COMPARISON:  Prior CT from 03/06/2020. FINDINGS: MRI THORACIC SPINE FINDINGS Alignment: Physiologic with preservation of the normal thoracic kyphosis. No listhesis. Vertebrae: Chronic compression deformity of T8 with sequelae of prior vertebral augmentation, stable. Associated trace retropulsion again noted. Additional mild chronic height loss noted at the superior endplates of T4, T5, and T7 with associated chronic endplate Schmorl's node deformities. Inferiorly, there is an acute compression fracture involving the superior endplate of Q73 with minimal height loss but no bony retropulsion. Superimposed endplate Schmorl's node deformity noted. No other acute fracture seen within the thoracic spine. Vertebral body height otherwise maintained. Underlying bone marrow signal intensity mildly heterogeneous but within normal limits. Few scattered benign hemangiomata noted. No worrisome osseous lesions. Reactive marrow edema noted about the right C3-4 facet due to facet arthritis (series 19, image 6). No other abnormal marrow edema. Cord: Normal signal and morphology. No epidural hematoma or other collection. Paraspinal and other soft tissues: Negative. Disc levels: Minimal for age disc bulging noted at T6-7 through T8-9. Trace bony retropulsion related to the chronic T8 compression fracture. No significant spinal stenosis or evidence for neural impingement. MRI LUMBAR SPINE FINDINGS Segmentation:  Standard. Alignment:  Physiologic.  No listhesis. Vertebrae: Acute compression fractures seen involving the superior endplates of L1 through L5. Relatively mild height loss seen at  these levels, most pronounced at L2 and L4 where there is mild 20-25% height loss. No bony retropulsion. These are benign/mechanical in appearance. Additional fractures  involving the right transverse processes of L1 and L2 again noted as well. Underlying bone marrow signal intensity within normal limits. No discrete or worrisome osseous lesions. No other abnormal marrow edema or enhancement. Conus medullaris: Extends to the L1 level and appears normal. 9 mm enhancing mass involving the nerve roots of the cauda equina posterior to the L2 vertebral body, stable as compared to previous MRI from 04/27/2019. No other mass lesion or abnormal enhancement. No epidural collections. Paraspinal and other soft tissues: Reactive soft tissue edema and enhancement adjacent to the L1 and L2 transverse process fractures. No other acute soft tissue abnormality. Subcentimeter simple cyst noted within the right kidney. Visualized visceral structures otherwise unremarkable. Disc levels: L1-2: Minimal disc desiccation and annular disc bulge. No stenosis. L2-3: Disc desiccation without significant disc bulge. No stenosis. L3-4:  Mild disc desiccation with minimal disc bulge.  No stenosis. L4-5: Mild disc desiccation with disc bulge. Superimposed tiny right foraminal disc protrusion closely approximates the exiting right L4 nerve root (series 25, image 31). Mild facet hypertrophy. No spinal stenosis. Mild bilateral L4 foraminal narrowing. L5-S1: Small central disc protrusion mildly indents the ventral thecal sac (series 25, image 37). Mild facet hypertrophy. No significant canal or foraminal stenosis. IMPRESSION: 1. Acute compression fractures involving the superior endplates of I78 through L5. Relatively mild height loss of no more than 20-25%, most pronounced at the L2 and L4 vertebral bodies. No bony retropulsion. 2. Additional acute fractures involving the right transverse processes of L1 and L2. 3. Chronic compression deformity of T8 with sequelae of prior vertebral augmentation. 4. 9 mm enhancing mass involving the nerve roots of the cauda equina, stable as compared to previous MRI from 04/27/2019.  5. Tiny right foraminal disc protrusion at L4-5, potentially affecting the transiting right L4 nerve root. Electronically Signed   By: Jeannine Boga M.D.   On: 03/07/2020 22:40   MR Lumbar Spine W Wo Contrast  Result Date: 03/07/2020 CLINICAL DATA:  Initial evaluation for compression fracture. EXAM: MRI THORACIC AND LUMBAR SPINE WITHOUT AND WITH CONTRAST TECHNIQUE: Multiplanar and multiecho pulse sequences of the thoracic and lumbar spine were obtained without and with intravenous contrast. CONTRAST:  18mL GADAVIST GADOBUTROL 1 MMOL/ML IV SOLN COMPARISON:  Prior CT from 03/06/2020. FINDINGS: MRI THORACIC SPINE FINDINGS Alignment: Physiologic with preservation of the normal thoracic kyphosis. No listhesis. Vertebrae: Chronic compression deformity of T8 with sequelae of prior vertebral augmentation, stable. Associated trace retropulsion again noted. Additional mild chronic height loss noted at the superior endplates of T4, T5, and T7 with associated chronic endplate Schmorl's node deformities. Inferiorly, there is an acute compression fracture involving the superior endplate of M76 with minimal height loss but no bony retropulsion. Superimposed endplate Schmorl's node deformity noted. No other acute fracture seen within the thoracic spine. Vertebral body height otherwise maintained. Underlying bone marrow signal intensity mildly heterogeneous but within normal limits. Few scattered benign hemangiomata noted. No worrisome osseous lesions. Reactive marrow edema noted about the right C3-4 facet due to facet arthritis (series 19, image 6). No other abnormal marrow edema. Cord: Normal signal and morphology. No epidural hematoma or other collection. Paraspinal and other soft tissues: Negative. Disc levels: Minimal for age disc bulging noted at T6-7 through T8-9. Trace bony retropulsion related to the chronic T8 compression fracture. No significant spinal stenosis or evidence for neural impingement.  MRI LUMBAR SPINE  FINDINGS Segmentation:  Standard. Alignment:  Physiologic.  No listhesis. Vertebrae: Acute compression fractures seen involving the superior endplates of L1 through L5. Relatively mild height loss seen at these levels, most pronounced at L2 and L4 where there is mild 20-25% height loss. No bony retropulsion. These are benign/mechanical in appearance. Additional fractures involving the right transverse processes of L1 and L2 again noted as well. Underlying bone marrow signal intensity within normal limits. No discrete or worrisome osseous lesions. No other abnormal marrow edema or enhancement. Conus medullaris: Extends to the L1 level and appears normal. 9 mm enhancing mass involving the nerve roots of the cauda equina posterior to the L2 vertebral body, stable as compared to previous MRI from 04/27/2019. No other mass lesion or abnormal enhancement. No epidural collections. Paraspinal and other soft tissues: Reactive soft tissue edema and enhancement adjacent to the L1 and L2 transverse process fractures. No other acute soft tissue abnormality. Subcentimeter simple cyst noted within the right kidney. Visualized visceral structures otherwise unremarkable. Disc levels: L1-2: Minimal disc desiccation and annular disc bulge. No stenosis. L2-3: Disc desiccation without significant disc bulge. No stenosis. L3-4:  Mild disc desiccation with minimal disc bulge.  No stenosis. L4-5: Mild disc desiccation with disc bulge. Superimposed tiny right foraminal disc protrusion closely approximates the exiting right L4 nerve root (series 25, image 31). Mild facet hypertrophy. No spinal stenosis. Mild bilateral L4 foraminal narrowing. L5-S1: Small central disc protrusion mildly indents the ventral thecal sac (series 25, image 37). Mild facet hypertrophy. No significant canal or foraminal stenosis. IMPRESSION: 1. Acute compression fractures involving the superior endplates of D22 through L5. Relatively mild height loss of no more than  20-25%, most pronounced at the L2 and L4 vertebral bodies. No bony retropulsion. 2. Additional acute fractures involving the right transverse processes of L1 and L2. 3. Chronic compression deformity of T8 with sequelae of prior vertebral augmentation. 4. 9 mm enhancing mass involving the nerve roots of the cauda equina, stable as compared to previous MRI from 04/27/2019. 5. Tiny right foraminal disc protrusion at L4-5, potentially affecting the transiting right L4 nerve root. Electronically Signed   By: Jeannine Boga M.D.   On: 03/07/2020 22:40   Scheduled Meds: . amLODipine  2.5 mg Oral Daily  . folic acid  1 mg Oral Daily  . lamoTRIgine  25 mg Oral Daily  . levETIRAcetam  500 mg Oral BID  . loratadine  10 mg Oral QPM  . LORazepam  0-4 mg Intravenous Q6H   Followed by  . [START ON 03/09/2020] LORazepam  0-4 mg Intravenous Q12H  . multivitamin with minerals  1 tablet Oral Daily  . pantoprazole  40 mg Oral BID AC  . potassium chloride  40 mEq Oral Once  . potassium chloride  40 mEq Oral BID  . thiamine  100 mg Oral Daily   Or  . thiamine  100 mg Intravenous Daily   Continuous Infusions: . sodium chloride Stopped (03/07/20 2105)    LOS: 1 day   Kerney Elbe, DO [facility] PAGER is on AMION  If 7PM-7AM, please contact night-coverage [email or web address]
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
## 2020-03-08 NOTE — Progress Notes (Signed)
Orthopedic Tech Progress Note Patient Details:  GEO SLONE Dec 26, 1970 [id number] Called in order to [facility] for an LSO  Patient ID: [name], male   DOB: 12-12-1970, 49 y.o.   MRN: [id number]   Janit Pagan 03/08/2020, 10:17 AM

## 2020-03-09 LAB — COMPREHENSIVE METABOLIC PANEL
ALT: 32 U/L (ref 0–44)
AST: 26 U/L (ref 15–41)
Albumin: 3.9 g/dL (ref 3.5–5.0)
Alkaline Phosphatase: 72 U/L (ref 38–126)
Anion gap: 10 (ref 5–15)
BUN: 12 mg/dL (ref 6–20)
CO2: 24 mmol/L (ref 22–32)
Calcium: 9.1 mg/dL (ref 8.9–10.3)
Chloride: 105 mmol/L (ref 98–111)
Creatinine, Ser: 0.92 mg/dL (ref 0.61–1.24)
GFR calc Af Amer: >60 mL/min (ref >60)
GFR calc non Af Amer: >60 mL/min (ref >60)
Glucose, Bld: 117 mg/dL — ABNORMAL HIGH (ref 70–99)
Potassium: 3.7 mmol/L (ref 3.5–5.1)
Sodium: 139 mmol/L (ref 135–145)
Total Bilirubin: 0.7 mg/dL (ref 0.3–1.2)
Total Protein: 7.4 g/dL (ref 6.5–8.1)

## 2020-03-09 LAB — CBC WITH DIFFERENTIAL/PLATELET
Abs Immature Granulocytes: 0.03 10*3/uL (ref 0.00–0.07)
Basophils Absolute: 0 10*3/uL (ref 0.0–0.1)
Basophils Relative: 1 %
Eosinophils Absolute: 0.1 10*3/uL (ref 0.0–0.5)
Eosinophils Relative: 2 %
HCT: 34.9 % — ABNORMAL LOW (ref 39.0–52.0)
Hemoglobin: 12.7 g/dL — ABNORMAL LOW (ref 13.0–17.0)
Immature Granulocytes: 0 %
Lymphocytes Relative: 23 %
Lymphs Abs: 1.7 10*3/uL (ref 0.7–4.0)
MCH: 31.3 pg (ref 26.0–34.0)
MCHC: 36.4 g/dL — ABNORMAL HIGH (ref 30.0–36.0)
MCV: 86 fL (ref 80.0–100.0)
Monocytes Absolute: 0.8 10*3/uL (ref 0.1–1.0)
Monocytes Relative: 12 %
Neutro Abs: 4.6 10*3/uL (ref 1.7–7.7)
Neutrophils Relative %: 62 %
Platelets: 197 10*3/uL (ref 150–400)
RBC: 4.06 MIL/uL — ABNORMAL LOW (ref 4.22–5.81)
RDW: 12.1 % (ref 11.5–15.5)
WBC: 7.3 10*3/uL (ref 4.0–10.5)
nRBC: 0 % (ref 0.0–0.2)

## 2020-03-09 LAB — GLUCOSE, CAPILLARY: Glucose-Capillary: 86 mg/dL (ref 70–99)

## 2020-03-09 LAB — MAGNESIUM: Magnesium: 1.9 mg/dL (ref 1.7–2.4)

## 2020-03-09 LAB — PHOSPHORUS: Phosphorus: 3.1 mg/dL (ref 2.5–4.6)

## 2020-03-09 MED ORDER — ONDANSETRON 4 MG PO TBDP: 4.0000 mg | ORAL_TABLET | Freq: Three times a day (TID) | ORAL | 0 refills | Status: DC | PRN

## 2020-03-09 MED ORDER — LEVOCETIRIZINE DIHYDROCHLORIDE 5 MG PO TABS: 5.0000 mg | ORAL_TABLET | Freq: Every evening | ORAL | 0 refills | Status: DC

## 2020-03-09 MED ORDER — METHOCARBAMOL 500 MG PO TABS: 500.0000 mg | ORAL_TABLET | Freq: Three times a day (TID) | ORAL | 0 refills | Status: DC | PRN

## 2020-03-09 MED ORDER — LAMOTRIGINE 25 MG PO TABS: ORAL_TABLET | ORAL | 0 refills | Status: DC

## 2020-03-09 MED ORDER — ADULT MULTIVITAMIN W/MINERALS CH: 1.0000 | ORAL_TABLET | Freq: Every day | ORAL | 0 refills | Status: DC

## 2020-03-09 MED ORDER — LEVETIRACETAM 500 MG PO TABS
500.0000 mg | ORAL_TABLET | Freq: Two times a day (BID) | ORAL | 0 refills | Status: DC
Start: 2020-03-09 — End: 2020-04-15

## 2020-03-09 MED ORDER — THIAMINE HCL 100 MG PO TABS: 100.0000 mg | ORAL_TABLET | Freq: Every day | ORAL | 0 refills | Status: DC

## 2020-03-09 MED ORDER — FOLIC ACID 1 MG PO TABS: 1.0000 mg | ORAL_TABLET | Freq: Every day | ORAL | 0 refills | Status: DC

## 2020-03-09 NOTE — TOC Progression Note (Signed)
Transition of Care [facility]) - Progression Note    Patient Details  Name: Donald Jenkins MRN: [id number] Date of Birth: [birth year]  [facility]) CM/SW [place], LCSW Phone Number: 03/09/2020, 10:32 AM  Clinical Narrative:    CSW met with patient at bedside and provided substance abuse resources. Explained to patient general process and encouraged patiet to contact CSW if additional assistance is needed.        Expected Discharge Plan and Services                                                 Social Determinants of Health (SDOH) Interventions    Readmission Risk Interventions No flowsheet data found.

## 2020-03-09 NOTE — Progress Notes (Signed)
Discharge instructions reviewed with the patient. IV removed by the patient. Patients mother is here to take the patient home. Patient will be wheeled out via wheelchair with belongings

## 2020-03-09 NOTE — Discharge Summary (Signed)
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 Additional mild chronic height loss noted at the superior endplates of T4, T5, and T7 with associated chronic endplate Schmorl's node deformities. Inferiorly, there is an acute compression fracture involving the superior endplate of K27 with minimal height loss but no bony retropulsion. Superimposed endplate Schmorl's node deformity noted. No other acute fracture seen within the thoracic spine. Vertebral body height otherwise maintained. Underlying bone marrow signal intensity mildly heterogeneous but within normal limits. Few scattered benign hemangiomata noted. No worrisome osseous lesions. Reactive marrow edema noted about the right C3-4 facet due to facet  arthritis (series 19, image 6). No other abnormal marrow edema. Cord: Normal signal and morphology. No epidural hematoma or other collection. Paraspinal and other soft tissues: Negative. Disc levels: Minimal for age disc bulging noted at T6-7 through T8-9. Trace bony retropulsion related to the chronic T8 compression fracture. No significant spinal stenosis or evidence for neural impingement. MRI LUMBAR SPINE FINDINGS Segmentation:  Standard. Alignment:  Physiologic.  No listhesis. Vertebrae: Acute compression fractures seen involving the superior endplates of L1 through L5. Relatively mild height loss seen at these levels, most pronounced at L2 and L4 where there is mild 20-25% height loss. No bony retropulsion. These are benign/mechanical  in appearance. Additional fractures involving the right transverse processes of L1 and L2 again noted as well. Underlying bone marrow signal intensity within normal limits. No discrete or worrisome osseous lesions. No other abnormal marrow edema or enhancement. Conus medullaris: Extends to the L1 level and appears normal. 9 mm enhancing mass involving the nerve roots of the cauda equina posterior to the L2 vertebral body, stable as compared to previous MRI from 04/27/2019. No other mass lesion or abnormal enhancement. No epidural collections. Paraspinal and other soft tissues: Reactive soft tissue edema and enhancement adjacent to the L1 and L2 transverse process fractures. No other acute soft tissue abnormality. Subcentimeter simple cyst noted within the right kidney. Visualized visceral structures otherwise unremarkable. Disc levels: L1-2: Minimal disc desiccation and annular disc bulge. No stenosis. L2-3: Disc desiccation without significant disc bulge. No stenosis. L3-4:  Mild disc desiccation with minimal disc bulge.  No stenosis. L4-5: Mild disc desiccation with disc bulge. Superimposed tiny right foraminal disc protrusion closely approximates the exiting right L4 nerve  root (series 25, image 31). Mild facet hypertrophy. No spinal stenosis. Mild bilateral L4 foraminal narrowing. L5-S1: Small central disc protrusion mildly indents the ventral thecal sac (series 25, image 37). Mild facet hypertrophy. No significant canal or foraminal stenosis. IMPRESSION: 1. Acute compression fractures involving the superior endplates of L24 through L5. Relatively mild height loss of no more than 20-25%, most pronounced at the L2 and L4 vertebral bodies. No bony retropulsion. 2. Additional acute fractures involving the right transverse processes of L1 and L2. 3. Chronic compression deformity of T8 with sequelae of prior vertebral augmentation. 4. 9 mm enhancing mass involving the nerve roots of the cauda equina, stable as compared to previous MRI from 04/27/2019. 5. Tiny right foraminal disc protrusion at L4-5, potentially affecting the transiting right L4 nerve root. Electronically Signed   By: Jeannine Boga M.D.   On: 03/07/2020 22:40   MR Lumbar Spine W Wo Contrast  Result Date: 03/07/2020 CLINICAL DATA:  Initial evaluation for compression fracture. EXAM: MRI THORACIC AND LUMBAR SPINE WITHOUT AND WITH CONTRAST TECHNIQUE: Multiplanar and multiecho pulse sequences of the thoracic and lumbar spine were obtained without and with intravenous contrast. CONTRAST:  63mL GADAVIST GADOBUTROL 1 MMOL/ML IV SOLN COMPARISON:  Prior CT from 03/06/2020. FINDINGS: MRI THORACIC SPINE FINDINGS Alignment: Physiologic with preservation of the normal thoracic kyphosis. No listhesis. Vertebrae: Chronic compression deformity of T8 with sequelae of prior vertebral augmentation, stable. Associated trace retropulsion again noted. Additional mild chronic height loss noted at the superior endplates of T4, T5, and T7 with associated chronic endplate Schmorl's node deformities. Inferiorly, there is an acute compression fracture involving the superior endplate of M01 with minimal height loss but no bony retropulsion.  Superimposed endplate Schmorl's node deformity noted. No other acute fracture seen within the thoracic spine. Vertebral body height otherwise maintained. Underlying bone marrow signal intensity mildly heterogeneous but within normal limits. Few scattered benign hemangiomata noted. No worrisome osseous lesions. Reactive marrow edema noted about the right C3-4 facet due to facet arthritis (series 19, image 6). No other abnormal marrow edema. Cord: Normal signal and morphology. No epidural hematoma or other collection. Paraspinal and other soft tissues: Negative. Disc levels: Minimal for age disc bulging noted at T6-7 through T8-9. Trace bony retropulsion related to the chronic T8 compression fracture. No significant spinal stenosis or evidence for neural impingement. MRI LUMBAR SPINE FINDINGS Segmentation:  Standard. Alignment:  Physiologic.  No listhesis. Vertebrae: Acute compression fractures seen involving the superior  endplates of L1 through L5. Relatively mild height loss seen at these levels, most pronounced at L2 and L4 where there is mild 20-25% height loss. No bony retropulsion. These are benign/mechanical in appearance. Additional fractures involving the right transverse processes of L1 and L2 again noted as well. Underlying bone marrow signal intensity within normal limits. No discrete or worrisome osseous lesions. No other abnormal marrow edema or enhancement. Conus medullaris: Extends to the L1 level and appears normal. 9 mm enhancing mass involving the nerve roots of the cauda equina posterior to the L2 vertebral body, stable as compared to previous MRI from 04/27/2019. No other mass lesion or abnormal enhancement. No epidural collections. Paraspinal and other soft tissues: Reactive soft tissue edema and enhancement adjacent to the L1 and L2 transverse process fractures. No other acute soft tissue abnormality. Subcentimeter simple cyst noted within the right kidney. Visualized visceral structures  otherwise unremarkable. Disc levels: L1-2: Minimal disc desiccation and annular disc bulge. No stenosis. L2-3: Disc desiccation without significant disc bulge. No stenosis. L3-4:  Mild disc desiccation with minimal disc bulge.  No stenosis. L4-5: Mild disc desiccation with disc bulge. Superimposed tiny right foraminal disc protrusion closely approximates the exiting right L4 nerve root (series 25, image 31). Mild facet hypertrophy. No spinal stenosis. Mild bilateral L4 foraminal narrowing. L5-S1: Small central disc protrusion mildly indents the ventral thecal sac (series 25, image 37). Mild facet hypertrophy. No significant canal or foraminal stenosis. IMPRESSION: 1. Acute compression fractures involving the superior endplates of B26 through L5. Relatively mild height loss of no more than 20-25%, most pronounced at the L2 and L4 vertebral bodies. No bony retropulsion. 2. Additional acute fractures involving the right transverse processes of L1 and L2. 3. Chronic compression deformity of T8 with sequelae of prior vertebral augmentation. 4. 9 mm enhancing mass involving the nerve roots of the cauda equina, stable as compared to previous MRI from 04/27/2019. 5. Tiny right foraminal disc protrusion at L4-5, potentially affecting the transiting right L4 nerve root. Electronically Signed   By: Jeannine Boga M.D.   On: 03/07/2020 22:40    Subjective: Seen and examined at bedside he is doing better.  Denies any chest pain, lightheadedness or dizziness.  Still has some back pain but felt okay.  No other concerns or complaints this time and happy that his numbers improved.  Wants a referral for a sleep center and will be given the number for the sleep center [facility] as we have also made a referral there for his neurological follow-up.  Discharge Exam: Vitals:   03/09/20 0822 03/09/20 1237  BP: (!) 147/92 125/88  Pulse: 98 91  Resp:  16  Temp:  98.3 F (36.8 C)  SpO2:  99%   Vitals:   03/08/20 1941 03/09/20  0415 03/09/20 0822 03/09/20 1237  BP: (!) 138/91 (!) 135/99 (!) 147/92 125/88  Pulse: 96 96 98 91  Resp: 18 20  16   Temp: 98.7 F (37.1 C) 98.3 F (36.8 C)  98.3 F (36.8 C)  TempSrc: Oral Oral  Oral  SpO2: 97% 97%  99%  Weight:      Height:       General: Pt is alert, awake, not in acute distress Cardiovascular: RRR, S1/S2 +, no rubs, no gallops Respiratory: Diminished bilaterally, no wheezing, no rhonchi Abdominal: Soft, NT, mildly distended, bowel sounds + Extremities: no edema, no cyanosis  The results of significant diagnostics from this hospitalization (including imaging, microbiology, ancillary and laboratory) are listed below for  reference.    Microbiology: Recent Results (from the past 240 hour(s))  SARS Coronavirus 2 by RT PCR (hospital order, performed in [facility] hospital lab) Nasopharyngeal Nasopharyngeal Swab     Status: None   Collection Time: 03/07/20  8:31 AM   Specimen: Nasopharyngeal Swab  Result Value Ref Range Status   SARS Coronavirus 2 NEGATIVE NEGATIVE Final    Comment: (NOTE) SARS-CoV-2 target nucleic acids are NOT DETECTED.  The SARS-CoV-2 RNA is generally detectable in upper and lower respiratory specimens during the acute phase of infection. The lowest concentration of SARS-CoV-2 viral copies this assay can detect is 250 copies / mL. A negative result does not preclude SARS-CoV-2 infection and should not be used as the sole basis for treatment or other patient management decisions.  A negative result may occur with improper specimen collection / handling, submission of specimen other than nasopharyngeal swab, presence of viral mutation(s) within the areas targeted by this assay, and inadequate number of viral copies (<250 copies / mL). A negative result must be combined with clinical observations, patient history, and epidemiological information.  Fact Sheet for Patients:   StrictlyIdeas.no  Fact Sheet for  Healthcare Providers: BankingDealers.co.za  This test is not yet approved or  cleared by the [place] FDA and has been authorized for detection and/or diagnosis of SARS-CoV-2 by FDA under an Emergency Use Authorization (EUA).  This EUA will remain in effect (meaning this test can be used) for the duration of the COVID-19 declaration under Section 564(b)(1) of the Act, 21 U.S.C. section 360bbb-3(b)(1), unless the authorization is terminated or revoked sooner.  Performed at [facility], [address] [place], [place] [address]     Labs: BNP (last 3 results) No results for input(s): BNP in the last 8760 hours. Basic Metabolic Panel: Recent Labs  Lab 03/06/20 1430 03/07/20 0831 03/08/20 0055 03/08/20 0731 03/09/20 0445  NA 138 133* 136  --  139  K 3.4* 3.2* 3.3*  --  3.7  CL 98 95* 100  --  105  CO2 13* 25 26  --  24  GLUCOSE 180* 108* 99  --  117*  BUN 26* 32* 16  --  12  CREATININE 1.52* 1.09 0.97  --  0.92  CALCIUM 9.2 8.6* 8.2*  --  9.1  MG  --   --  2.2  --  1.9  PHOS  --   --   --  2.7 3.1   Liver Function Tests: Recent Labs  Lab 03/07/20 0831 03/08/20 0731 03/09/20 0445  AST 48* 33 26  ALT 44 34 32  ALKPHOS 83 67 72  BILITOT 1.9* 1.1 0.7  PROT 7.6 6.5 7.4  ALBUMIN 4.2 3.6 3.9   No results for input(s): LIPASE, AMYLASE in the last 168 hours. No results for input(s): AMMONIA in the last 168 hours. CBC: Recent Labs  Lab 03/07/20 1921 03/08/20 0055 03/08/20 0731 03/08/20 1321 03/09/20 0445  WBC 7.9 7.1 6.5 7.5 7.3  NEUTROABS  --   --   --   --  4.6  HGB 12.8* 11.4* 12.0* 12.5* 12.7*  HCT 36.7* 32.7* 34.9* 34.4* 34.9*  MCV 89.3 89.6 89.5 86.6 86.0  PLT 169 166 170 177 197   Cardiac Enzymes: No results for input(s): CKTOTAL, CKMB, CKMBINDEX, TROPONINI in the last 168 hours. BNP: Invalid input(s): POCBNP CBG: Recent Labs  Lab 03/08/20 0739 03/09/20 0737  GLUCAP 89 86   D-Dimer No results for input(s):  DDIMER in the  last 72 hours. Hgb A1c No results for input(s): HGBA1C in the last 72 hours. Lipid Profile No results for input(s): CHOL, HDL, LDLCALC, TRIG, CHOLHDL, LDLDIRECT in the last 72 hours. Thyroid function studies No results for input(s): TSH, T4TOTAL, T3FREE, THYROIDAB in the last 72 hours.  Invalid input(s): FREET3 Anemia work up No results for input(s): VITAMINB12, FOLATE, FERRITIN, TIBC, IRON, RETICCTPCT in the last 72 hours. Urinalysis    Component Value Date/Time   COLORURINE AMBER (A) 05/18/2019 1059   APPEARANCEUR CLOUDY (A) 05/18/2019 1059   LABSPEC 1.025 05/18/2019 1059   PHURINE 5.0 05/18/2019 1059   GLUCOSEU NEGATIVE 05/18/2019 1059   HGBUR SMALL (A) 05/18/2019 1059   BILIRUBINUR NEGATIVE 05/18/2019 1059   BILIRUBINUR small (A) 06/06/2015 1020   BILIRUBINUR neg 07/23/2014 1058   KETONESUR 20 (A) 05/18/2019 1059   PROTEINUR 100 (A) 05/18/2019 1059   UROBILINOGEN 0.2 06/06/2015 1020   UROBILINOGEN 0.2 02/12/2014 2204   NITRITE NEGATIVE 05/18/2019 1059   LEUKOCYTESUR NEGATIVE 05/18/2019 1059   Sepsis Labs Invalid input(s): PROCALCITONIN,  WBC,  LACTICIDVEN Microbiology Recent Results (from the past 240 hour(s))  SARS Coronavirus 2 by RT PCR (hospital order, performed in [facility] hospital lab) Nasopharyngeal Nasopharyngeal Swab     Status: None   Collection Time: 03/07/20  8:31 AM   Specimen: Nasopharyngeal Swab  Result Value Ref Range Status   SARS Coronavirus 2 NEGATIVE NEGATIVE Final    Comment: (NOTE) SARS-CoV-2 target nucleic acids are NOT DETECTED.  The SARS-CoV-2 RNA is generally detectable in upper and lower respiratory specimens during the acute phase of infection. The lowest concentration of SARS-CoV-2 viral copies this assay can detect is 250 copies / mL. A negative result does not preclude SARS-CoV-2 infection and should not be used as the sole basis for treatment or other patient management decisions.  A negative result may occur  with improper specimen collection / handling, submission of specimen other than nasopharyngeal swab, presence of viral mutation(s) within the areas targeted by this assay, and inadequate number of viral copies (<250 copies / mL). A negative result must be combined with clinical observations, patient history, and epidemiological information.  Fact Sheet for Patients:   StrictlyIdeas.no  Fact Sheet for Healthcare Providers: BankingDealers.co.za  This test is not yet approved or  cleared by the [place] FDA and has been authorized for detection and/or diagnosis of SARS-CoV-2 by FDA under an Emergency Use Authorization (EUA).  This EUA will remain in effect (meaning this test can be used) for the duration of the COVID-19 declaration under Section 564(b)(1) of the Act, 21 U.S.C. section 360bbb-3(b)(1), unless the authorization is terminated or revoked sooner.  Performed at [facility], [address] [place], [place] [address]    Time coordinating discharge: 35 minutes  SIGNED:  Kerney Elbe, DO [facility] 03/09/2020, 7:00 PM Pager is on [place]  If 7PM-7AM, please contact night-coverage [email or web address]

## 2020-03-11 ENCOUNTER — Telehealth: Payer: Self-pay

## 2020-03-11 ENCOUNTER — Other Ambulatory Visit: Payer: Self-pay | Admitting: Family Medicine

## 2020-03-11 DIAGNOSIS — I10 Essential (primary) hypertension: Secondary | ICD-10-CM

## 2020-03-11 LAB — SURGICAL PATHOLOGY

## 2020-03-11 LAB — LAMOTRIGINE LEVEL: Lamotrigine Lvl: <1 ug/mL — ABNORMAL LOW (ref 2.0–20.0)

## 2020-03-11 MED ORDER — LISINOPRIL-HYDROCHLOROTHIAZIDE 10-12.5 MG PO TABS: 1.0000 | ORAL_TABLET | Freq: Every day | ORAL | 0 refills | Status: DC

## 2020-03-11 MED ORDER — AMLODIPINE BESYLATE 2.5 MG PO TABS: 2.5000 mg | ORAL_TABLET | Freq: Every day | ORAL | 0 refills | Status: DC

## 2020-03-11 NOTE — Telephone Encounter (Signed)
Transition Care Management Follow-up Telephone Call  Date of discharge and from where: 03/09/2020, [facility]  How have you been since you were released from the hospital? He said he is "managing."   Any questions or concerns? noted below regarding amlodipine.   Items Reviewed:  Did the pt receive and understand the discharge instructions provided?  yes  Medications obtained and verified?  he said that he has all of his medications except the lisinopril- hydrochlorothiazide.  He has only been taking the amlodipine since he has been out of the lisinopril-hydrochlorothiazide .  He prefers not to take the amlodipine and is requesting a refill for the lisinopril-hydrochlorothiazide.  Both medications are listed to be taking on his AVS  He did not have any other questions about his med regime.   Any new allergies since your discharge?  none reported   Do you have support at home?  yes, his mother  Functional Questionnaire: (I = Independent and D = Dependent) ADLs: independent. Has cane and back brace.   Follow up appointments reviewed:   PCP Hospital f/u appt confirmed?.Dr Chapman Fitch -03/20/2020  Specialist Hospital f/u appt confirmed? he needs to schedule appointments with neurology as well as orthopedic surgery.  The contact numbers are on his AVS  Are transportation arrangements needed? no,his mother can drive.  He is aware that he is not to drive for 6 months,   If their condition worsens, is the pt aware to call PCP or go to the Emergency Dept.?  yes  Was the patient provided with contact information for the PCP's office or ED?  he has the phone number for the clinic  Was to pt encouraged to call back with questions or concerns?  yes

## 2020-03-11 NOTE — Telephone Encounter (Signed)
New prescriptions for patient's amlodipine and lisinopril hydrochlorothiazide sent to his listed pharmacy, [facility].

## 2020-03-11 NOTE — Progress Notes (Signed)
Patient ID: Donald Jenkins, male   DOB: 1970-07-07, 49 y.o.   MRN: [id number]    Per hospital discharge transition note from RN case manager, patient needs refills of blood pressure medications.  He has not been taking the amlodipine as he has been out of the medication but also stated that he did not really want to take the amlodipine.  New prescription will be sent in for his amlodipine and lisinopril hydrochlorothiazide and he is scheduled for follow-up appointment later this month.

## 2020-03-12 ENCOUNTER — Telehealth: Payer: Self-pay | Admitting: Psychiatry

## 2020-03-12 ENCOUNTER — Other Ambulatory Visit: Payer: Self-pay | Admitting: Family Medicine

## 2020-03-12 ENCOUNTER — Telehealth: Payer: Self-pay | Admitting: Neurology

## 2020-03-12 MED ORDER — PANTOPRAZOLE SODIUM 40 MG PO TBEC: 40.0000 mg | DELAYED_RELEASE_TABLET | Freq: Every day | ORAL | 0 refills | Status: DC

## 2020-03-12 NOTE — Telephone Encounter (Signed)
Medication Refill - Medication: pantoprazole (PROTONIX) 40 MG tablet Pt rather get this refilled instead of buying nexium each time   Has the patient contacted their pharmacy? No. (Agent: If no, request that the patient contact the pharmacy for the refill.) (Agent: If yes, when and what did the pharmacy advise?)  Preferred Pharmacy (with phone number or street name):  [id number] [address]  [place], [place] [address]  Phone:  [phone] Fax:  [phone]  Agent: Please be advised that RX refills may take up to 3 business days. We ask that you follow-up with your pharmacy.
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
## 2020-03-12 NOTE — Telephone Encounter (Signed)
Please review

## 2020-03-12 NOTE — Telephone Encounter (Signed)
Attempted to contact the patient # [id number]  to inform him that : New prescriptions for patient's amlodipine and lisinopril hydrochlorothiazide sent to [facility]. Message left with call back requested to this CM

## 2020-03-12 NOTE — Telephone Encounter (Signed)
Requested medication (s) are due for refill today: No  Requested medication (s) are on the active medication list: Yes  Last refill:  03/07/20  Future visit scheduled: Yes  Notes to clinic:  Historical provider.    Requested Prescriptions  Pending Prescriptions Disp Refills   pantoprazole (PROTONIX) 40 MG tablet      Sig: Take 1 tablet (40 mg total) by mouth daily.      Gastroenterology: Proton Pump Inhibitors Passed - 03/12/2020 10:42 AM      Passed - Valid encounter within last 12 months    Recent Outpatient Visits           7 months ago Essential hypertension   Howard, Jarome Matin, RPH-CPP   8 months ago Seizure-like activity [facility])   Crouch, MD   9 months ago Essential hypertension   [place], RPH-CPP   9 months ago Hospital discharge follow-up   [place], Zelda W, NP   1 year ago Compression fracture of T8 vertebra with routine healing, subsequent encounter   [place], MD       Future Appointments             In 1 week Antony Blackbird, MD [place]

## 2020-03-12 NOTE — Telephone Encounter (Signed)
[facility] is aware [facility]

## 2020-03-12 NOTE — Telephone Encounter (Signed)
Looks like patient had a recurrent seizure, review of the notes indicates he was off Lamictal, supposed to be taking 100 mg twice a day.  Was restarted in the ER, along with Keppra.  He was hospitalized for coffee-ground emesis, compression fracture of the thoracic vertebra, seizure.  Please schedule for revisit.
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
## 2020-03-14 ENCOUNTER — Telehealth: Payer: Self-pay | Admitting: Family Medicine

## 2020-03-14 NOTE — Telephone Encounter (Signed)
Spoke w/ pt and gave lab results/provider instructions, pt verbalized understanding

## 2020-03-14 NOTE — Telephone Encounter (Signed)
Copied from [place] [phone]. Topic: General - Inquiry >> Mar 14, 2020 10:49 AM Alease Frame wrote: Reason for CRM: Pt called in stating he received a call from office for lab results . Doesn't say ok for [facility] to give results. Please advise

## 2020-03-18 ENCOUNTER — Telehealth: Payer: Self-pay | Admitting: Psychiatry

## 2020-03-18 NOTE — Telephone Encounter (Signed)
Pt called requesting referral to [place] at [facility] for his chronic insomnia.

## 2020-03-18 NOTE — Telephone Encounter (Signed)
Phone call from Donald Jenkins is returned clarifying for him that Dr. Alfredia Ferguson on September 9 discharging him from inpatient stay for seizures reported referral to [name] for the seizures and to [facility] for sleep disorder assessment.  If further medical recommendation is needed, he should process that with primary care Dr. Chapman Fitch and her staff rather than thinking psychiatry can provide such referral not allowed to best of my understanding.  He likely phones here because he called last week .expecting Lunesta prescription that was denied him at hospital discharge after neurology had told him to absolutely not take [place] in the past.

## 2020-03-19 ENCOUNTER — Telehealth: Payer: Self-pay | Admitting: Psychiatry

## 2020-03-19 NOTE — Telephone Encounter (Signed)
Appt made with MD

## 2020-03-19 NOTE — Telephone Encounter (Signed)
Mr. cordie buening message for office that he requires and expects medical psychiatric attestation to his need for sleep disorder assessment and treatment at [facility] as this office has had more longitudinal tracking and documentation of symptoms than hospitalist, pimary care, or neurology services thus far.  Treatment in this office was first with Dr. Dustin Flock from 2014-2016 concluded to have social anxiety disorder and depressive disorder considered at that time to have mixed features or bipolar diathesis.  He had responded poorly to BuSpar, Wellbutrin, and Paxil prior to treatment by Dr. Candis Schatz with  Zoloft, Effexor, trazodone, Seroquel, Depakote, Lamictal, Xanax, Lunesta, and Sonata. Patient reported panic attacks from Seroquel and priapism from trazodone.  In the 5 years of my subsequent care for him as Dr. Candis Schatz departed the office, social anxiety has remained the core psychiatric problem with secondary panic and major depressive disorders requiring treatment, though he always continued to have insomnia initially considered a symptom of the more primary psychiatric disorders. Sonata, Remeron, Lamictal, and Xanax were then continued for 3 years facilitating gradual but significant improvement as social and panic anxiety and depression stabilized such that with his social life and employment, his medication became reduced  to Xanax when occasionally needed for anxiety and the continued need for Sonata as sleep problems persisted.  In May 2020, hypersomnia in the workplace prompted consideration of Nuvigil not covered by his insurance, and need for sleep disorder assessment and treatment was concluded.  In the last 16 months he has manifested seizures such that dissociative amnesia and fugue may be more related to seizure diathesis than psychopathology..  Sleep deprivation and disruption of his work and social life have continued with social anxiety  becoming more severe again particularly with loss of relationships and jobs.  Currently Xanax is prescribed 1 mg 3 times daily as needed, and the Sonata as well as Lunesta and Ambien have only been apparently given during inpatient stays with hospitalist services. Neurology as part of treatment for seizures advises against Sonata.  Symptoms and consequences have become chaotic and not explainable too often.  The patient has been encouraged and advised to foremeost comply with his antiepileptic treatment explaining willingness of this office to change the as needed Xanax for anxiety at any point recommended in the course of his neurology care.  Sleep disorder assessment and treatment are now essential for psychiatric treatment of panic and social anxiety considering seizure diathesis.

## 2020-03-20 ENCOUNTER — Ambulatory Visit: Payer: 59 | Admitting: Family Medicine

## 2020-03-22 ENCOUNTER — Telehealth: Payer: Self-pay | Admitting: Psychiatry

## 2020-03-22 NOTE — Telephone Encounter (Signed)
Referral sent to [facility]

## 2020-03-22 NOTE — Telephone Encounter (Signed)
Reviewed, thank you.

## 2020-03-23 ENCOUNTER — Other Ambulatory Visit: Payer: Self-pay | Admitting: Family Medicine

## 2020-04-03 ENCOUNTER — Other Ambulatory Visit: Payer: Self-pay

## 2020-04-03 ENCOUNTER — Ambulatory Visit: Payer: 59 | Admitting: Neurology

## 2020-04-03 ENCOUNTER — Encounter: Payer: Self-pay | Admitting: Neurology

## 2020-04-03 VITALS — BP 153/111 | HR 94 | Ht 72.0 in | Wt 235.0 lb

## 2020-04-03 DIAGNOSIS — F419 Anxiety disorder, unspecified: Secondary | ICD-10-CM

## 2020-04-03 DIAGNOSIS — F32A Depression, unspecified: Secondary | ICD-10-CM

## 2020-04-03 DIAGNOSIS — R569 Unspecified convulsions: Secondary | ICD-10-CM

## 2020-04-03 DIAGNOSIS — F5104 Psychophysiologic insomnia: Secondary | ICD-10-CM | POA: Diagnosis not present

## 2020-04-03 MED ORDER — ZALEPLON 10 MG PO CAPS: 10.0000 mg | ORAL_CAPSULE | Freq: Every evening | ORAL | 0 refills | Status: DC | PRN

## 2020-04-03 NOTE — Patient Instructions (Signed)
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
## 2020-04-15 ENCOUNTER — Ambulatory Visit: Payer: 59 | Attending: Family Medicine | Admitting: Family Medicine

## 2020-04-15 ENCOUNTER — Other Ambulatory Visit: Payer: Self-pay

## 2020-04-15 ENCOUNTER — Encounter: Payer: Self-pay | Admitting: Family Medicine

## 2020-04-15 VITALS — BP 111/77 | HR 88 | Temp 97.3°F | Ht 71.0 in | Wt 234.0 lb

## 2020-04-15 DIAGNOSIS — G40909 Epilepsy, unspecified, not intractable, without status epilepticus: Secondary | ICD-10-CM | POA: Diagnosis not present

## 2020-04-15 DIAGNOSIS — S22000A Wedge compression fracture of unspecified thoracic vertebra, initial encounter for closed fracture: Secondary | ICD-10-CM

## 2020-04-15 DIAGNOSIS — Z09 Encounter for follow-up examination after completed treatment for conditions other than malignant neoplasm: Secondary | ICD-10-CM

## 2020-04-15 DIAGNOSIS — D72829 Elevated white blood cell count, unspecified: Secondary | ICD-10-CM | POA: Diagnosis not present

## 2020-04-15 DIAGNOSIS — E871 Hypo-osmolality and hyponatremia: Secondary | ICD-10-CM

## 2020-04-15 DIAGNOSIS — K3189 Other diseases of stomach and duodenum: Secondary | ICD-10-CM

## 2020-04-15 DIAGNOSIS — I10 Essential (primary) hypertension: Secondary | ICD-10-CM

## 2020-04-15 DIAGNOSIS — E876 Hypokalemia: Secondary | ICD-10-CM

## 2020-04-15 DIAGNOSIS — Z79899 Other long term (current) drug therapy: Secondary | ICD-10-CM

## 2020-04-15 DIAGNOSIS — Z91148 Patient's other noncompliance with medication regimen for other reason: Secondary | ICD-10-CM

## 2020-04-15 DIAGNOSIS — Z9114 Patient's other noncompliance with medication regimen: Secondary | ICD-10-CM

## 2020-04-15 MED ORDER — LEVETIRACETAM 500 MG PO TABS: 500.0000 mg | ORAL_TABLET | Freq: Two times a day (BID) | ORAL | 0 refills | Status: DC
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
## 2020-04-16 LAB — CBC WITH DIFFERENTIAL/PLATELET
Basophils Absolute: 0.1 x10E3/uL (ref 0.0–0.2)
Basos: 1 %
EOS (ABSOLUTE): 0.4 x10E3/uL (ref 0.0–0.4)
Eos: 4 %
Hematocrit: 41.6 % (ref 37.5–51.0)
Hemoglobin: 14.1 g/dL (ref 13.0–17.7)
Immature Grans (Abs): 0 x10E3/uL (ref 0.0–0.1)
Immature Granulocytes: 0 %
Lymphocytes Absolute: 2.1 x10E3/uL (ref 0.7–3.1)
Lymphs: 24 %
MCH: 29.4 pg (ref 26.6–33.0)
MCHC: 33.9 g/dL (ref 31.5–35.7)
MCV: 87 fL (ref 79–97)
Monocytes Absolute: 0.7 x10E3/uL (ref 0.1–0.9)
Monocytes: 8 %
Neutrophils Absolute: 5.4 x10E3/uL (ref 1.4–7.0)
Neutrophils: 63 %
Platelets: 319 x10E3/uL (ref 150–450)
RBC: 4.79 x10E6/uL (ref 4.14–5.80)
RDW: 12.2 % (ref 11.6–15.4)
WBC: 8.6 x10E3/uL (ref 3.4–10.8)

## 2020-04-16 LAB — BASIC METABOLIC PANEL WITH GFR
BUN/Creatinine Ratio: 13 (ref 9–20)
BUN: 12 mg/dL (ref 6–24)
CO2: 26 mmol/L (ref 20–29)
Calcium: 9.4 mg/dL (ref 8.7–10.2)
Chloride: 97 mmol/L (ref 96–106)
Creatinine, Ser: 0.91 mg/dL (ref 0.76–1.27)
GFR calc Af Amer: 115 mL/min/1.73
GFR calc non Af Amer: 99 mL/min/1.73
Glucose: 82 mg/dL (ref 65–99)
Potassium: 4.5 mmol/L (ref 3.5–5.2)
Sodium: 137 mmol/L (ref 134–144)

## 2020-04-16 LAB — HEPATIC FUNCTION PANEL
ALT: 21 IU/L (ref 0–44)
AST: 16 IU/L (ref 0–40)
Albumin: 4.5 g/dL (ref 4.0–5.0)
Alkaline Phosphatase: 122 IU/L — ABNORMAL HIGH (ref 44–121)
Bilirubin Total: 0.2 mg/dL (ref 0.0–1.2)
Bilirubin, Direct: <0.1 mg/dL (ref 0.00–0.40)
Total Protein: 7.2 g/dL (ref 6.0–8.5)

## 2020-04-16 LAB — MAGNESIUM: Magnesium: 1.7 mg/dL (ref 1.6–2.3)

## 2020-04-18 ENCOUNTER — Other Ambulatory Visit: Payer: Self-pay | Admitting: Family Medicine

## 2020-04-18 ENCOUNTER — Encounter: Payer: Self-pay | Admitting: Psychiatry

## 2020-04-19 ENCOUNTER — Other Ambulatory Visit: Payer: Self-pay | Admitting: Family Medicine

## 2020-04-19 ENCOUNTER — Telehealth: Payer: Self-pay | Admitting: Family Medicine

## 2020-04-19 MED ORDER — PANTOPRAZOLE SODIUM 40 MG PO TBEC: 40.0000 mg | DELAYED_RELEASE_TABLET | Freq: Every day | ORAL | 0 refills | Status: DC

## 2020-04-19 NOTE — Telephone Encounter (Signed)
Copied from [place] [id number]. Topic: General - Other >> Apr 18, 2020 11:36 AM Leward Quan A wrote: Reason for CRM: Patient called in to inquire why his [facility] asking [facility] Rx for pantoprazole (PROTONIX) 40 MG tablet [id number] [address], [place]  Phone:  [phone] Fax:  [phone]

## 2020-04-19 NOTE — Telephone Encounter (Signed)
Rx sent to correct pharmacy.

## 2020-04-22 ENCOUNTER — Ambulatory Visit (INDEPENDENT_AMBULATORY_CARE_PROVIDER_SITE_OTHER): Payer: 59 | Admitting: Neurology

## 2020-04-22 DIAGNOSIS — F32A Depression, unspecified: Secondary | ICD-10-CM

## 2020-04-22 DIAGNOSIS — F5104 Psychophysiologic insomnia: Secondary | ICD-10-CM

## 2020-04-22 DIAGNOSIS — R569 Unspecified convulsions: Secondary | ICD-10-CM

## 2020-04-22 DIAGNOSIS — G4761 Periodic limb movement disorder: Secondary | ICD-10-CM

## 2020-04-23 ENCOUNTER — Other Ambulatory Visit: Payer: Self-pay | Admitting: Neurology

## 2020-04-23 ENCOUNTER — Institutional Professional Consult (permissible substitution): Payer: Self-pay | Admitting: Neurology

## 2020-04-24 NOTE — Telephone Encounter (Signed)
This is not my primary patient and followed by Butler Denmark for Dr Jannifer Franklin.

## 2020-05-05 NOTE — Progress Notes (Signed)
No follow up in the sleep clinic is scheduled.  IMPRESSION:  1. No evidence of clinically relevant Sleep Apnea.  2. The Periodic Limb Movements (PLMD) were positional dependent (left side) and only seen in NREM sleep- likely related to his spinal compression fracture.  3. Patient described vivid, bizarre dreams. No abnormal EEG activity as can be seen on reduced PSG channel.   RECOMMENDATIONS: Sleep initiation was delayed, and sleep ended at 5.15AM, later than the patient is used to. I could see no evidence of nocturnal, sleep related seizures. The technologist did not see any abnormal activity. No organic sleep disorder was identified. I recommend to return to Dr Jannifer Franklin for seizure follow up and Dr. Creig Hines for a possible referral to cognitive behavior therapy.
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
## 2020-05-06 ENCOUNTER — Encounter: Payer: Self-pay | Admitting: Neurology

## 2020-05-06 DIAGNOSIS — S43101A Unspecified dislocation of right acromioclavicular joint, initial encounter: Secondary | ICD-10-CM | POA: Insufficient documentation

## 2020-05-13 ENCOUNTER — Other Ambulatory Visit: Payer: Self-pay | Admitting: Family Medicine

## 2020-05-13 DIAGNOSIS — G40909 Epilepsy, unspecified, not intractable, without status epilepticus: Secondary | ICD-10-CM

## 2020-05-13 NOTE — Telephone Encounter (Signed)
Requested medication (s) are due for refill today: yes  Requested medication (s) are on the active medication list: yes  Last refill:  04/15/20 #60  Future visit scheduled: yes  Notes to clinic:  Please review for refill. Refill not delegated per protocol    Requested Prescriptions  Pending Prescriptions Disp Refills   levETIRAcetam (KEPPRA) 500 MG tablet [Pharmacy Med Name: LEVETIRACETAM 500 MG TABLET] 60 tablet 0    Sig: TAKE 1 TABLET BY MOUTH TWICE A DAY      Not Delegated - Neurology:  Anticonvulsants Failed - 05/13/2020 11:31 AM      Failed - This refill cannot be delegated      Passed - HCT in normal range and within 360 days    Hematocrit  Date Value Ref Range Status  04/15/2020 41.6 37.5 - 51.0 % Final          Passed - HGB in normal range and within 360 days    Hemoglobin  Date Value Ref Range Status  04/15/2020 14.1 13.0 - 17.7 g/dL Final          Passed - PLT in normal range and within 360 days    Platelets  Date Value Ref Range Status  04/15/2020 319 150 - 450 x10E3/uL Final   Platelet Count, POC  Date Value Ref Range Status  07/23/2014 311 142 - 424 K/uL Final          Passed - WBC in normal range and within 360 days    WBC  Date Value Ref Range Status  04/15/2020 8.6 3.4 - 10.8 x10E3/uL Final  03/09/2020 7.3 4.0 - 10.5 K/uL Final          Passed - Valid encounter within last 12 months    Recent Outpatient Visits           4 weeks ago Hospital discharge follow-up   [place], MD   9 months ago Essential hypertension   Donald Jenkins, Donald Jenkins, Donald Jenkins   10 months ago Seizure-like activity [facility])   [place], MD   11 months ago Essential hypertension   [place], Donald Jenkins, Donald Jenkins   11 months ago Hospital discharge follow-up   [place], Vernia Buff, NP       Future Appointments             In 2 months Antony Blackbird, MD [place]

## 2020-05-14 ENCOUNTER — Other Ambulatory Visit: Payer: Self-pay | Admitting: Family Medicine

## 2020-05-14 ENCOUNTER — Telehealth: Payer: Self-pay | Admitting: Family Medicine

## 2020-05-14 ENCOUNTER — Ambulatory Visit: Payer: Self-pay | Admitting: Psychiatry

## 2020-05-14 DIAGNOSIS — K219 Gastro-esophageal reflux disease without esophagitis: Secondary | ICD-10-CM

## 2020-05-14 MED ORDER — FAMOTIDINE 20 MG PO TABS: 20.0000 mg | ORAL_TABLET | Freq: Two times a day (BID) | ORAL | 5 refills | Status: DC

## 2020-05-14 NOTE — Progress Notes (Signed)
Patient ID: Donald Jenkins, male   DOB: [birth year], 49 y.o.   MRN: [id number]   Patient left message stating that he has noticed increased muscle/joint pain with the use of pantoprazole. Pantoprazole will be changed to twice daily pepcid and patient will need to contact his Neurologist regarding his Keppra.

## 2020-05-14 NOTE — Telephone Encounter (Signed)
He will need to contact his Neurologist regarding the [place]. I can change the pantoprazole.

## 2020-05-14 NOTE — Telephone Encounter (Signed)
Copied from [place] [id number]. Topic: General - Other >> May 14, 2020  1:02 PM Yvette Rack wrote: Reason for CRM: Pt stated he has noticed increased joint pain in his hips since he began taking the levETIRAcetam (KEPPRA) 500 MG tablet and pantoprazole (PROTONIX) 40 MG tablet. Pt also stated he has been to several well respected medical sites that gives details of the side effects of these medications and he would like clinical staff to research the side effects. Pt would like to know if there are any other medications that does not have the joint pain as side effects. Pt requests call back. Cb# [phone]

## 2020-05-21 ENCOUNTER — Ambulatory Visit (INDEPENDENT_AMBULATORY_CARE_PROVIDER_SITE_OTHER): Payer: 59 | Admitting: Psychiatry

## 2020-05-21 ENCOUNTER — Encounter: Payer: Self-pay | Admitting: Psychiatry

## 2020-05-21 ENCOUNTER — Other Ambulatory Visit: Payer: Self-pay

## 2020-05-21 VITALS — Ht 71.0 in | Wt 242.0 lb

## 2020-05-21 DIAGNOSIS — F5105 Insomnia due to other mental disorder: Secondary | ICD-10-CM

## 2020-05-21 DIAGNOSIS — F3341 Major depressive disorder, recurrent, in partial remission: Secondary | ICD-10-CM | POA: Diagnosis not present

## 2020-05-21 DIAGNOSIS — F4010 Social phobia, unspecified: Secondary | ICD-10-CM

## 2020-05-21 DIAGNOSIS — F410 Panic disorder [episodic paroxysmal anxiety] without agoraphobia: Secondary | ICD-10-CM

## 2020-05-21 DIAGNOSIS — R569 Unspecified convulsions: Secondary | ICD-10-CM

## 2020-05-21 MED ORDER — LORAZEPAM 1 MG PO TABS: 1.0000 mg | ORAL_TABLET | Freq: Three times a day (TID) | ORAL | 2 refills | Status: DC | PRN
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
## 2020-05-22 ENCOUNTER — Encounter: Payer: Self-pay | Admitting: Neurology

## 2020-05-22 ENCOUNTER — Ambulatory Visit: Payer: 59 | Admitting: Neurology

## 2020-05-22 VITALS — BP 148/95 | HR 96 | Ht 71.0 in | Wt 243.0 lb

## 2020-05-22 DIAGNOSIS — G40909 Epilepsy, unspecified, not intractable, without status epilepticus: Secondary | ICD-10-CM | POA: Diagnosis not present

## 2020-05-22 DIAGNOSIS — R569 Unspecified convulsions: Secondary | ICD-10-CM | POA: Diagnosis not present

## 2020-05-22 DIAGNOSIS — Z0289 Encounter for other administrative examinations: Secondary | ICD-10-CM

## 2020-05-22 MED ORDER — LEVETIRACETAM 500 MG PO TABS: 500.0000 mg | ORAL_TABLET | Freq: Two times a day (BID) | ORAL | 3 refills | Status: DC
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
## 2020-06-04 ENCOUNTER — Telehealth: Payer: Self-pay | Admitting: *Deleted
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
## 2020-06-10 ENCOUNTER — Telehealth: Payer: Self-pay | Admitting: Psychiatry

## 2020-06-10 DIAGNOSIS — F5105 Insomnia due to other mental disorder: Secondary | ICD-10-CM

## 2020-06-10 DIAGNOSIS — F4010 Social phobia, unspecified: Secondary | ICD-10-CM

## 2020-06-10 DIAGNOSIS — F410 Panic disorder [episodic paroxysmal anxiety] without agoraphobia: Secondary | ICD-10-CM

## 2020-06-10 MED ORDER — ALPRAZOLAM 1 MG PO TABS: 1.0000 mg | ORAL_TABLET | Freq: Three times a day (TID) | ORAL | 0 refills | Status: DC | PRN

## 2020-06-10 NOTE — Telephone Encounter (Signed)
Donald Jenkins called and said that the Ativan isn't helping him with sleep, anxiety and stress like he had hoped. He is requesting to go back on Alprazolam. Call to [facility] [address].  [phone].
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
## 2020-06-10 NOTE — Addendum Note (Signed)
Addended by: Milana Huntsman E on: 06/10/2020 02:27 PM   Modules accepted: Orders

## 2020-06-11 ENCOUNTER — Other Ambulatory Visit: Payer: Self-pay | Admitting: Family Medicine

## 2020-06-17 ENCOUNTER — Ambulatory Visit: Payer: 59 | Admitting: Psychiatry

## 2020-06-26 ENCOUNTER — Ambulatory Visit: Payer: 59 | Admitting: Psychiatry

## 2020-07-08 ENCOUNTER — Ambulatory Visit: Payer: 59

## 2020-07-16 ENCOUNTER — Ambulatory Visit: Payer: 59

## 2020-07-16 ENCOUNTER — Other Ambulatory Visit: Payer: Self-pay

## 2020-07-17 ENCOUNTER — Ambulatory Visit: Payer: 59 | Admitting: Family Medicine

## 2020-07-17 ENCOUNTER — Ambulatory Visit: Payer: 59 | Admitting: Physician Assistant

## 2020-08-05 ENCOUNTER — Other Ambulatory Visit: Payer: Self-pay

## 2020-08-05 ENCOUNTER — Ambulatory Visit (INDEPENDENT_AMBULATORY_CARE_PROVIDER_SITE_OTHER): Payer: 59 | Admitting: Psychiatry

## 2020-08-05 DIAGNOSIS — G40909 Epilepsy, unspecified, not intractable, without status epilepticus: Secondary | ICD-10-CM | POA: Diagnosis not present

## 2020-08-05 DIAGNOSIS — F5105 Insomnia due to other mental disorder: Secondary | ICD-10-CM | POA: Diagnosis not present

## 2020-08-05 DIAGNOSIS — R6510 Systemic inflammatory response syndrome (SIRS) of non-infectious origin without acute organ dysfunction: Secondary | ICD-10-CM

## 2020-08-05 DIAGNOSIS — F411 Generalized anxiety disorder: Secondary | ICD-10-CM

## 2020-08-05 DIAGNOSIS — S22000A Wedge compression fracture of unspecified thoracic vertebra, initial encounter for closed fracture: Secondary | ICD-10-CM

## 2020-08-05 DIAGNOSIS — S32000A Wedge compression fracture of unspecified lumbar vertebra, initial encounter for closed fracture: Secondary | ICD-10-CM

## 2020-08-05 DIAGNOSIS — R569 Unspecified convulsions: Secondary | ICD-10-CM

## 2020-08-05 DIAGNOSIS — F331 Major depressive disorder, recurrent, moderate: Secondary | ICD-10-CM

## 2020-08-05 DIAGNOSIS — F3341 Major depressive disorder, recurrent, in partial remission: Secondary | ICD-10-CM
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
## 2020-08-07 ENCOUNTER — Encounter: Payer: Self-pay | Admitting: Physician Assistant

## 2020-08-07 ENCOUNTER — Ambulatory Visit: Payer: 59 | Attending: Physician Assistant | Admitting: Physician Assistant

## 2020-08-07 ENCOUNTER — Other Ambulatory Visit: Payer: Self-pay

## 2020-08-07 VITALS — BP 112/75 | HR 80 | Resp 16 | Wt 237.6 lb

## 2020-08-07 DIAGNOSIS — G40909 Epilepsy, unspecified, not intractable, without status epilepticus: Secondary | ICD-10-CM

## 2020-08-07 DIAGNOSIS — E559 Vitamin D deficiency, unspecified: Secondary | ICD-10-CM | POA: Diagnosis not present

## 2020-08-07 DIAGNOSIS — I10 Essential (primary) hypertension: Secondary | ICD-10-CM | POA: Diagnosis not present

## 2020-08-07 DIAGNOSIS — K257 Chronic gastric ulcer without hemorrhage or perforation: Secondary | ICD-10-CM

## 2020-08-07 DIAGNOSIS — E876 Hypokalemia: Secondary | ICD-10-CM | POA: Diagnosis not present

## 2020-08-07 MED ORDER — LISINOPRIL-HYDROCHLOROTHIAZIDE 10-12.5 MG PO TABS: ORAL_TABLET | ORAL | 1 refills | Status: DC

## 2020-08-07 MED ORDER — LEVETIRACETAM 500 MG PO TABS: 500.0000 mg | ORAL_TABLET | Freq: Two times a day (BID) | ORAL | 3 refills | Status: DC

## 2020-08-07 NOTE — Progress Notes (Signed)
Donald Jenkins, is a 50 y.o. male  TML:[id number]  KCL:[id number]  DOB - 1970-08-24  Subjective:  Chief Complaint and HPI: Donald Jenkins is a 50 y.o. male here today for med RF and lab work.  He is compliant with meds.  Not currently taking K+ supplements or magnesium and wants to have levels checked.  No seizure activity.    H/o stomach ulcer and unsure how long he is supposed to be on medications.  He never saw GI in follow-up.  Some night-time esophageal spasms that last about 5 mins and resolve with drinking water. No melena or hematochezia.  No SOB or radiating pain.    ROS:   Constitutional:  No f/c, No night sweats, No unexplained weight loss. EENT:  No vision changes, No blurry vision, No hearing changes. No mouth, throat, or ear problems.  Respiratory: No cough, No SOB Cardiac: No CP, no palpitations GI:  No abd pain, No N/V/D. GU: No Urinary s/sx Musculoskeletal: No joint pain Neuro: No headache, no dizziness, no motor weakness.  Skin: No rash Endocrine:  No polydipsia. No polyuria.  Psych: Denies SI/HI  No problems updated.  ALLERGIES: Allergies  Allergen Reactions  . Penicillins Other (See Comments)    Did it involve swelling of the face/tongue/throat, SOB, or low BP? Unknown Did it involve sudden or severe rash/hives, skin peeling, or any reaction on the inside of your mouth or nose? Unknown Did you need to seek medical attention at a hospital or doctor's office? Unknown When did it last happen?childhood If all above answers are "NO", may proceed with cephalosporin use.     PAST MEDICAL HISTORY: Past Medical History:  Diagnosis Date  . Anxiety    social  . Hiatal hernia 2003  . Hypertension   . T8 vertebral fracture ([place])   . Tachycardia   . Tumors    on spine    MEDICATIONS AT HOME: Prior to Admission medications   Medication Sig Start Date End Date Taking? Authorizing Provider  ALPRAZolam Duanne Moron) 1 MG tablet Take 1 tablet (1 mg  total) by mouth 3 (three) times daily as needed for anxiety or sleep (Panic). 06/10/20   Delight Hoh, MD  amLODipine (NORVASC) 2.5 MG tablet Take 1 tablet (2.5 mg total) by mouth daily. To lower blood pressure 03/11/20   Fulp, Cammie, MD  ergocalciferol (DRISDOL) 200 MCG/ML drops Take 5,000 Units by mouth daily.    [provider]  famotidine (PEPCID) 20 MG tablet Take 1 tablet (20 mg total) by mouth 2 (two) times daily. To reduce stomach acid 05/14/20   Fulp, Cammie, MD  levETIRAcetam (KEPPRA) 500 MG tablet Take 1 tablet (500 mg total) by mouth 2 (two) times daily. 08/07/20   Argentina Donovan, PA-C  levocetirizine (XYZAL) 5 MG tablet Take 1 tablet (5 mg total) by mouth every evening. To help with nasal allergies 03/09/20   Raiford Noble Latif, DO  lisinopril-hydrochlorothiazide (ZESTORETIC) 10-12.5 MG tablet TAKE 1 TABLET BY MOUTH DAILY. TO LOWER BLOOD PRESSURE 08/07/20   Freeman Caldron M, PA-C  ondansetron (ZOFRAN-ODT) 4 MG disintegrating tablet Take 1 tablet (4 mg total) by mouth every 8 (eight) hours as needed for nausea or vomiting. 03/09/20   Raiford Noble Latif, DO  potassium chloride (KLOR-CON) 10 MEQ tablet Take 10 mEq by mouth 2 (two) times daily.    [provider]  thiamine 100 MG tablet Take 1 tablet (100 mg total) by mouth daily. 03/10/20   Kerney Elbe, DO  methylphenidate (RITALIN) 5 MG tablet Take 1 tablet (5 mg total) by mouth 3 (three) times daily with meals. Patient not taking: Reported on 12/22/2018 11/16/18 03/03/19  Delight Hoh, MD  mirtazapine (REMERON) 15 MG tablet Take 1 tablet (15 mg total) by mouth at bedtime. Patient not taking: Reported on 03/02/2019 01/18/19 03/03/19  Fulp, Ander Gaster, MD     Objective:  EXAM:   Vitals:   08/07/20 1023  BP: 112/75  Pulse: 80  Resp: 16  SpO2: 97%  Weight: 237 lb 9.6 oz (107.8 kg)    General appearance : A&OX3. NAD. Non-toxic-appearing HEENT: Atraumatic and Normocephalic.  PERRLA. EOM intact.  Neck: supple,  no JVD. No cervical lymphadenopathy. No thyromegaly Chest/Lungs:  Breathing-non-labored, Good air entry bilaterally, breath sounds normal without rales, rhonchi, or wheezing  CVS: S1 S2 regular, no murmurs, gallops, rubs  Extremities: Bilateral Lower Ext shows no edema, both legs are warm to touch with = pulse throughout Neurology:  CN II-XII grossly intact, Non focal.   Psych:  TP linear. J/I WNL. Normal speech. Appropriate eye contact and affect.  Skin:  No Rash  Data Review Lab Results  Component Value Date   HGBA1C 4.8 12/08/2018   HGBA1C 5.4 06/06/2015   HGBA1C 5.4 06/06/2015     Assessment & Plan   1. Seizure disorder (HCC) stable - levETIRAcetam (KEPPRA) 500 MG tablet; Take 1 tablet (500 mg total) by mouth 2 (two) times daily.  Dispense: 180 tablet; Refill: 3 - Magnesium - Levetiracetam level  2. Essential hypertension controlled - lisinopril-hydrochlorothiazide (ZESTORETIC) 10-12.5 MG tablet; TAKE 1 TABLET BY MOUTH DAILY. TO LOWER BLOOD PRESSURE  Dispense: 90 tablet; Refill: 1 - Comprehensive metabolic panel - CBC with Differential/Platelet  3. Hypokalemia - Comprehensive metabolic panel  4. Vitamin D deficiency - Vitamin D, 25-hydroxy  5. Chronic gastric ulcer, unspecified whether gastric ulcer hemorrhage or perforation present - Ambulatory referral to Gastroenterology     Patient have been counseled extensively about nutrition and exercise  Return in about 3 months (around 11/04/2020) for assign new PCP;  htn/seizure d/o.  The patient was given clear instructions to go to ER or return to medical center if symptoms don't improve, worsen or new problems develop. The patient verbalized understanding. The patient was told to call to get lab results if they haven't heard anything in the next week.     Freeman Caldron, PA-C [facility] [place], [place]   08/07/2020, 2:22 PM

## 2020-08-08 ENCOUNTER — Other Ambulatory Visit: Payer: Self-pay | Admitting: Physician Assistant

## 2020-08-08 MED ORDER — VITAMIN D (ERGOCALCIFEROL) 1.25 MG (50000 UNIT) PO CAPS: 50000.0000 [IU] | ORAL_CAPSULE | ORAL | 0 refills | Status: DC

## 2020-08-10 LAB — LEVETIRACETAM LEVEL: Levetiracetam Lvl: 18 ug/mL (ref 10.0–40.0)

## 2020-08-10 LAB — CBC WITH DIFFERENTIAL/PLATELET
Basophils Absolute: 0.1 10*3/uL (ref 0.0–0.2)
Basos: 1 %
EOS (ABSOLUTE): 0.2 10*3/uL (ref 0.0–0.4)
Eos: 3 %
Hematocrit: 46.5 % (ref 37.5–51.0)
Hemoglobin: 15.7 g/dL (ref 13.0–17.7)
Immature Grans (Abs): 0 10*3/uL (ref 0.0–0.1)
Immature Granulocytes: 0 %
Lymphocytes Absolute: 1.9 10*3/uL (ref 0.7–3.1)
Lymphs: 27 %
MCH: 28.3 pg (ref 26.6–33.0)
MCHC: 33.8 g/dL (ref 31.5–35.7)
MCV: 84 fL (ref 79–97)
Monocytes Absolute: 0.6 10*3/uL (ref 0.1–0.9)
Monocytes: 8 %
Neutrophils Absolute: 4.5 10*3/uL (ref 1.4–7.0)
Neutrophils: 61 %
Platelets: 280 10*3/uL (ref 150–450)
RBC: 5.55 x10E6/uL (ref 4.14–5.80)
RDW: 13.5 % (ref 11.6–15.4)
WBC: 7.2 10*3/uL (ref 3.4–10.8)

## 2020-08-10 LAB — COMPREHENSIVE METABOLIC PANEL
ALT: 21 IU/L (ref 0–44)
AST: 16 IU/L (ref 0–40)
Albumin/Globulin Ratio: 1.7 (ref 1.2–2.2)
Albumin: 4.8 g/dL (ref 4.0–5.0)
Alkaline Phosphatase: 121 IU/L (ref 44–121)
BUN/Creatinine Ratio: 14 (ref 9–20)
BUN: 14 mg/dL (ref 6–24)
Bilirubin Total: 0.4 mg/dL (ref 0.0–1.2)
CO2: 21 mmol/L (ref 20–29)
Calcium: 10 mg/dL (ref 8.7–10.2)
Chloride: 100 mmol/L (ref 96–106)
Creatinine, Ser: 1.01 mg/dL (ref 0.76–1.27)
GFR calc Af Amer: 101 mL/min/{1.73_m2} (ref >59)
GFR calc non Af Amer: 88 mL/min/{1.73_m2} (ref >59)
Globulin, Total: 2.9 g/dL (ref 1.5–4.5)
Glucose: 94 mg/dL (ref 65–99)
Potassium: 4.4 mmol/L (ref 3.5–5.2)
Sodium: 143 mmol/L (ref 134–144)
Total Protein: 7.7 g/dL (ref 6.0–8.5)

## 2020-08-10 LAB — VITAMIN D 25 HYDROXY (VIT D DEFICIENCY, FRACTURES): Vit D, 25-Hydroxy: 22.4 ng/mL — ABNORMAL LOW (ref 30.0–100.0)

## 2020-08-10 LAB — MAGNESIUM: Magnesium: 2 mg/dL (ref 1.6–2.3)

## 2020-08-21 ENCOUNTER — Ambulatory Visit (INDEPENDENT_AMBULATORY_CARE_PROVIDER_SITE_OTHER): Payer: 59 | Admitting: Psychiatry

## 2020-08-21 ENCOUNTER — Other Ambulatory Visit: Payer: Self-pay

## 2020-08-21 DIAGNOSIS — F5105 Insomnia due to other mental disorder: Secondary | ICD-10-CM | POA: Diagnosis not present

## 2020-08-21 DIAGNOSIS — F331 Major depressive disorder, recurrent, moderate: Secondary | ICD-10-CM | POA: Diagnosis not present

## 2020-08-21 DIAGNOSIS — G40909 Epilepsy, unspecified, not intractable, without status epilepticus: Secondary | ICD-10-CM

## 2020-08-21 DIAGNOSIS — R6510 Systemic inflammatory response syndrome (SIRS) of non-infectious origin without acute organ dysfunction: Secondary | ICD-10-CM

## 2020-08-21 DIAGNOSIS — F411 Generalized anxiety disorder: Secondary | ICD-10-CM

## 2020-08-21 DIAGNOSIS — S22000A Wedge compression fracture of unspecified thoracic vertebra, initial encounter for closed fracture: Secondary | ICD-10-CM

## 2020-08-21 DIAGNOSIS — F441 Dissociative fugue: Secondary | ICD-10-CM

## 2020-08-21 DIAGNOSIS — S32000A Wedge compression fracture of unspecified lumbar vertebra, initial encounter for closed fracture: Secondary | ICD-10-CM
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
## 2020-08-22 ENCOUNTER — Ambulatory Visit (INDEPENDENT_AMBULATORY_CARE_PROVIDER_SITE_OTHER): Payer: 59 | Admitting: Psychiatry

## 2020-08-22 ENCOUNTER — Encounter: Payer: Self-pay | Admitting: Psychiatry

## 2020-08-22 DIAGNOSIS — F5105 Insomnia due to other mental disorder: Secondary | ICD-10-CM

## 2020-08-22 DIAGNOSIS — F4010 Social phobia, unspecified: Secondary | ICD-10-CM

## 2020-08-22 DIAGNOSIS — F410 Panic disorder [episodic paroxysmal anxiety] without agoraphobia: Secondary | ICD-10-CM | POA: Diagnosis not present

## 2020-08-22 MED ORDER — ALPRAZOLAM 1 MG PO TABS: 1.0000 mg | ORAL_TABLET | Freq: Three times a day (TID) | ORAL | 3 refills | Status: DC | PRN
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
## 2020-08-23 ENCOUNTER — Telehealth: Payer: Self-pay | Admitting: Psychiatry

## 2020-08-23 ENCOUNTER — Encounter: Payer: Self-pay | Admitting: Psychiatry

## 2020-08-23 DIAGNOSIS — Z0289 Encounter for other administrative examinations: Secondary | ICD-10-CM

## 2020-08-23 NOTE — Telephone Encounter (Signed)
Donald Jenkins called to let you know that he needs the letter that he spoke to you about on his last visit. He will pick the letter up when you have it ready. His phone number is [phone].

## 2020-08-23 NOTE — Progress Notes (Signed)
Admin note for non-service contact  Patient ID: Donald Jenkins  MRN: [id number] DATE: 08/23/2020  (1) On request and consent, prepared review of findings for use at upcoming [facility] hearing.  Staff to notify and place in record.  Administrative encounter fee $45.  (2) Coordination of care meeting with new psychiatrist, Donald Jenkins, [facility], who similarly found potentially prejudicial information on record and has provided attestation to him as of initial evaluation yesterday on transfer from Donald Jenkins.  Agreed to remove "Alcohol Use" (Z code, but seems to have already contributed to bias) from his EHR Problem List after considering ways of relabelling it and placing a clarifying note.  We do not believe he has manifested any substance abuse problem involving alcohol, opioids, or benzodiazepines, and we find him credible about very occasional alcohol and fuly compliant use of prescriptions.  Donald Serve, PhD [name], PhD LP Clinical Psychologist, [facility] [address], [place], [place] [address] [phone]

## 2020-08-30 ENCOUNTER — Telehealth: Payer: Self-pay

## 2020-08-30 NOTE — Telephone Encounter (Signed)
Patient came by needing a letter from [place] from his appt on 2/9. His lawyer is requesting a signed letter from the provider for his hearing on 3/18 stating he is in compliance with taking his Keppra and all of his levels are good. Patient stated he could see the notes in mychart about his labs but his lawyer will not accept that. He can be reached at [phone] and would like a call when ready for pick up.

## 2020-09-02 ENCOUNTER — Encounter: Payer: Self-pay | Admitting: Physician Assistant

## 2020-09-04 ENCOUNTER — Other Ambulatory Visit: Payer: Self-pay | Admitting: Internal Medicine

## 2020-09-04 ENCOUNTER — Other Ambulatory Visit: Payer: Self-pay | Admitting: Physician Assistant

## 2020-09-04 ENCOUNTER — Encounter: Payer: Self-pay | Admitting: Physician Assistant

## 2020-09-04 DIAGNOSIS — F419 Anxiety disorder, unspecified: Secondary | ICD-10-CM

## 2020-09-04 DIAGNOSIS — F32A Depression, unspecified: Secondary | ICD-10-CM

## 2020-09-04 DIAGNOSIS — K219 Gastro-esophageal reflux disease without esophagitis: Secondary | ICD-10-CM

## 2020-09-04 NOTE — Telephone Encounter (Signed)
Called patient and LVM advising him that he had a letter ready for pickup up front. Advised patient to call with any questions or concerns.

## 2020-09-04 NOTE — Telephone Encounter (Signed)
Letter is ready and signed and placed up front.  Please make patient aware.    Thanks, Levada Dy

## 2020-09-04 NOTE — Telephone Encounter (Signed)
Requested medication (s) are due for refill today: yes  Requested medication (s) are on the active medication list: yes  Last refill:  06/25/20  Future visit scheduled: yes  Notes to clinic: script per Dr Shellia Cleverly from hospital admit    Requested Prescriptions  Pending Prescriptions Disp Refills   levocetirizine (XYZAL) 5 MG tablet [Pharmacy Med Name: LEVOCETIRIZINE 5 MG TABLET] 90 tablet 1    Sig: TAKE 1 TABLET (5 MG TOTAL) BY MOUTH EVERY EVENING. TO HELP WITH NASAL ALLERGIES      Ear, Nose, and Throat:  Antihistamines Passed - 09/04/2020  9:50 AM      Passed - Valid encounter within last 12 months    Recent Outpatient Visits           4 weeks ago Essential hypertension   [place], Dionne Bucy, [place]   4 months ago Hospital discharge follow-up   [place], MD   1 year ago Essential hypertension   [place], RPH-CPP   1 year ago Seizure-like activity [facility])   [place] Antony Blackbird, MD   1 year ago Essential hypertension   [place], RPH-CPP       Future Appointments             In 2 months Ladell Pier, MD [place]              Refused Prescriptions Disp Refills   pantoprazole (PROTONIX) 40 MG tablet [Pharmacy Med Name: PANTOPRAZOLE SOD DR 40 MG TAB] 90 tablet 1    Sig: TAKE 1 TABLET BY MOUTH EVERY DAY      Gastroenterology: Proton Pump Inhibitors Passed - 09/04/2020  9:50 AM      Passed - Valid encounter within last 12 months    Recent Outpatient Visits           4 weeks ago Essential hypertension   [place], [place], [place]   4 months ago Hospital discharge follow-up   [place] Antony Blackbird, MD   1 year ago Essential  hypertension   [place], RPH-CPP   1 year ago Seizure-like activity [facility])   [place], [place], [place]   1 year ago Essential hypertension   [place], RPH-CPP       Future Appointments             In 2 months Wynetta Emery, Dalbert Batman, MD [place]

## 2020-09-09 ENCOUNTER — Encounter: Payer: Self-pay | Admitting: Neurology

## 2020-09-18 ENCOUNTER — Other Ambulatory Visit: Payer: Self-pay

## 2020-09-18 ENCOUNTER — Ambulatory Visit (INDEPENDENT_AMBULATORY_CARE_PROVIDER_SITE_OTHER): Payer: 59 | Admitting: Psychiatry

## 2020-09-18 DIAGNOSIS — G40909 Epilepsy, unspecified, not intractable, without status epilepticus: Secondary | ICD-10-CM | POA: Diagnosis not present

## 2020-09-18 DIAGNOSIS — F5105 Insomnia due to other mental disorder: Secondary | ICD-10-CM

## 2020-09-18 DIAGNOSIS — F410 Panic disorder [episodic paroxysmal anxiety] without agoraphobia: Secondary | ICD-10-CM | POA: Diagnosis not present

## 2020-09-18 DIAGNOSIS — F4010 Social phobia, unspecified: Secondary | ICD-10-CM | POA: Diagnosis not present

## 2020-09-18 NOTE — Progress Notes (Signed)
Psychotherapy Progress Note [facility] [name], PhD LP  Patient ID: Donald Jenkins     MRN: [id number] Therapy format: Individual psychotherapy Date: 09/18/2020      Start: 10:08a     Stop: 10:58a     Time Spent: 50 min Location: In-person   Session narrative (presenting needs, interim history, self-report of stressors and symptoms, applications of prior therapy, status changes, and interventions made in session) Bundle of nerves last week leading up to the [facility] medical hearing, severe insomnia, could only get about 2 hrs/night and not dropping off until 5:30am.  Worst times ever have been complete inability to sleep overnight or by day.  Does believe he is constitutionally hypersensitive to light, so does all he can to dim in the evening but did not know about blue spectrum and electronics until first seen.  Sleeping better, certainly, since the anxiety of his hearing is over.  Found favorably, thanks to advocacy letter, and started sleeping better -- 6 hrs, 6 hrs, 8 hrs last night.  Driver's license is currently reactivated as of Monday, albeit after encountering aged physicians on the review board and his attorney having to phone in rather than attend in person due to failing a temperature screening at the hearing.  Has not yet picked up amber glasses but will.    Currently jobless.  Last indication is nobody returning his calls from [facility], feels they have strung him along and dodged accountability for his status once he demonstrated a significant health problem.  Officially he is terminated, attributed to ... not sure what, but basically seems to be a medically unsuitable argument, though not clear whether it triggers ADA protections.  Just "no position available" last word.  Settling with that, coming ready to move on from [place], maybe move on from floral.  Wants to find new work, even in a different field, but right now short on clothes that fit, and cash-strapped  enough to not feel able to shop.  Feels 50 # above his normal weight, would rather lose that to fit his clothes so he doesn't have to ask for more help from mother, but clear she is willing.  Thrift stores always seem to be out of his size in pants (34).  Discussed options and acceptable framing of further assistance from his mother.  Argued that the earlier he gets his next paycheck, the earlier he can be sure of not needing the help, which helps all concerned.  Not sure what he would do for works.  Objectively, needs normal hours, not overtime and split shifts so as to reduce neurological stress, especially if insomnia proves brittle to manage.  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  Re. location, ideally, would like to try out [place].  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  Willing to relocate after 25 years in [place] -- already made that move to [place] last September.    For finances, agreed we need to spare costs best possible.  Is selling off some collectibles, possessions to help make ends meet, currently has about 4 months' rent, but another year on the apartment contract, and no break-lease contract, just that he is obligated to cover the unit until it gets rented again.  Suggested there may be legal advice available about limits of liability, but it legitimately could be the full year, which just started.  For sleep, agreed to start using amber lenses, reading more and TV less.    Therapeutic modalities: Cognitive Behavioral Therapy and Solution-Oriented/Positive Psychology  Mental Status/Observations:  Appearance:   Casual     Behavior:  Appropriate  Motor:  Normal  Speech/Language:   Clear and Coherent  Affect:  Appropriate and less urgent, better rested  Mood:  anxious, dysthymic and improved, less urgent  Thought process:  normal  Thought content:     WNL  Sensory/Perceptual disturbances:    WNL  Orientation:  Fully oriented  Attention:  Good    Concentration:  Good  Memory:  WNL  Insight:    Good  Judgment:   Fair  Impulse Control:  Good   Risk Assessment: Danger to Self: No Self-injurious Behavior: No Danger to Others: No Physical Aggression / Violence: No Duty to Warn: No Access to Firearms a concern: No  Assessment of progress:  progressing well  Diagnosis:   ICD-10-CM   1. Insomnia disorder, with non-sleep disorder mental comorbidity, persistent -- extreme light sensitivity  F51.05   2. Panic disorder  F41.0   3. Social anxiety disorder  F40.10   4. Seizure disorder ([place]) by history  G40.909    Plan:  . Goals: o Obtain amber lenses and implement for evenings o Reduce TV, seek more reading in evenings o undertake some form of exercise -- walk where see fit o Job-seeking -- at least keep up reconnaissance, get better acquainted with what is available on a transitional basis o Be willing to ask further help from mother to stabilize wardrobe -- loan, not a gift, if he can live with it, and if she insists on a loan, he can always gift it back on his own. . Other recommendations/advice as may be noted above . Continue to utilize previously learned skills ad lib . Maintain medication as prescribed and work faithfully with relevant prescriber(s) if any changes are desired or seem indicated . Call the clinic on-call service, present to ER, or call 911 if any life-threatening psychiatric crisis Return in about 2 months (around 11/18/2020). . Already scheduled visit in this office 11/20/2020.  Blanchie Serve, PhD [name], PhD LP Clinical Psychologist, [facility] [address] [place], [place] [address] [phone]

## 2020-10-09 IMAGING — MR MR HEAD W/O CM
9 of 11 series · 37 of 48 positions shown · non-contrast
Comparison: Head CT 05/18/2019 and MRI 04/27/2019

CLINICAL DATA: Encephalopathy.  Seizure like activity.

EXAM:
MRI HEAD WITHOUT CONTRAST
TECHNIQUE: Multiplanar, multiecho pulse sequences of the brain and surrounding
structures were obtained without intravenous contrast.

[Series 3: DWI · axial · 3.0mm · 1.09mm/px · z∈[-32,+141]mm · 8 of 118 slices shown (1 of 4)]
[im 1/118]
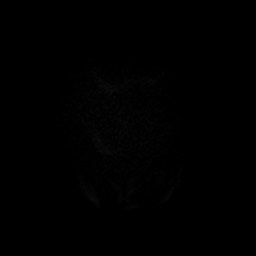
[im 14/118]
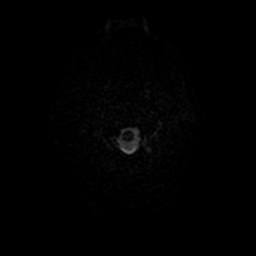
[im 40/118]
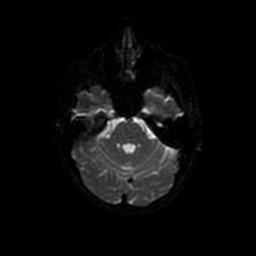
[im 53/118]
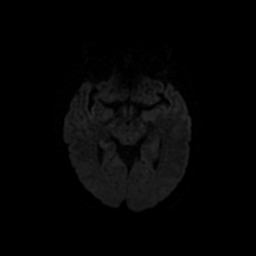
[im 66/118]
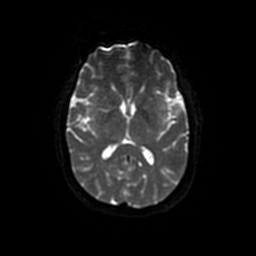
[im 79/118]
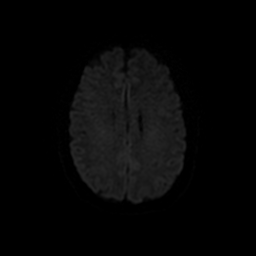
[im 105/118]
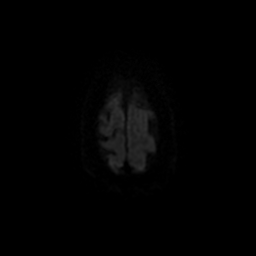
[im 118/118]
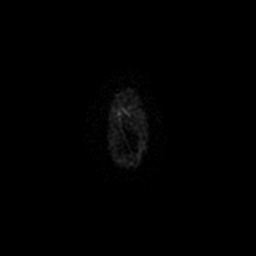

[Series 4: T1 · sagittal · 5.0mm · 0.47mm/px · 2 of 24 slices shown (1 of 2)]
[im 1/24]
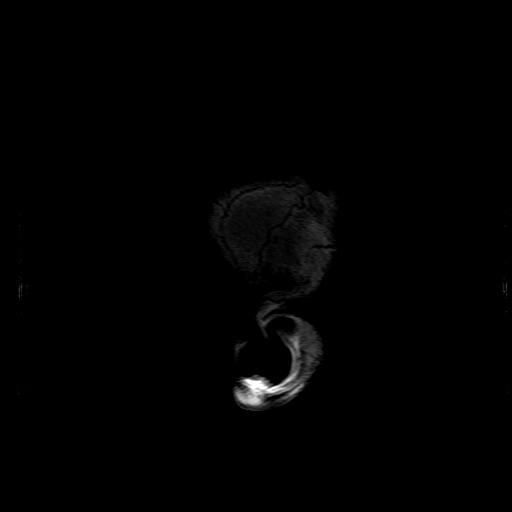
[im 24/24]
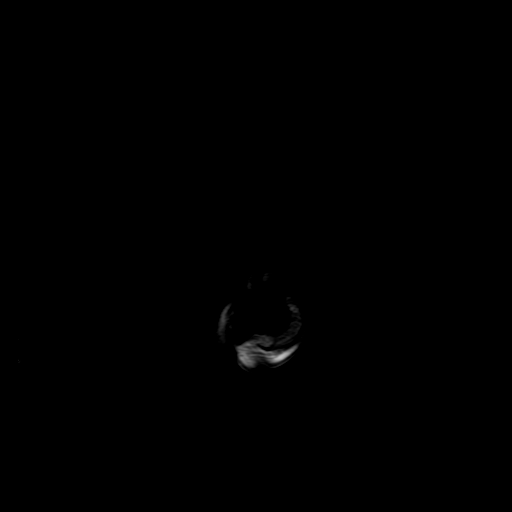

[Series 5: T2 · axial · 5.0mm · 0.43mm/px · z∈[-40,+134]mm · 2 of 26 slices shown (1 of 2)]
[im 1/26]
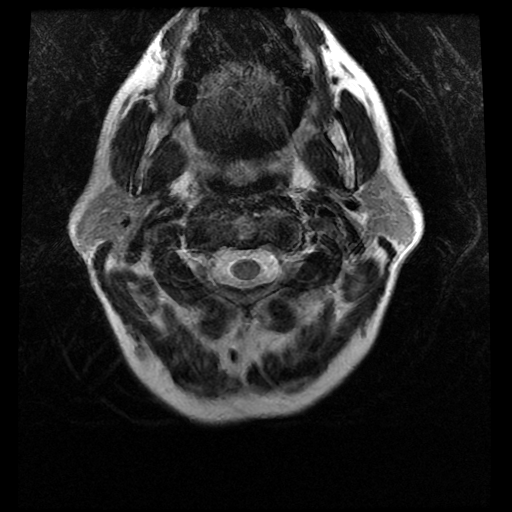
[im 26/26]
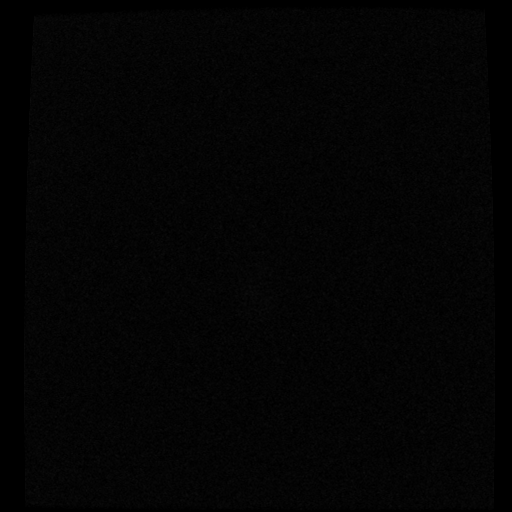

[Series 6: FLAIR · axial · 3.0mm · 0.43mm/px · z∈[-25,+136]mm · 2 of 28 slices shown]
[im 1/28]
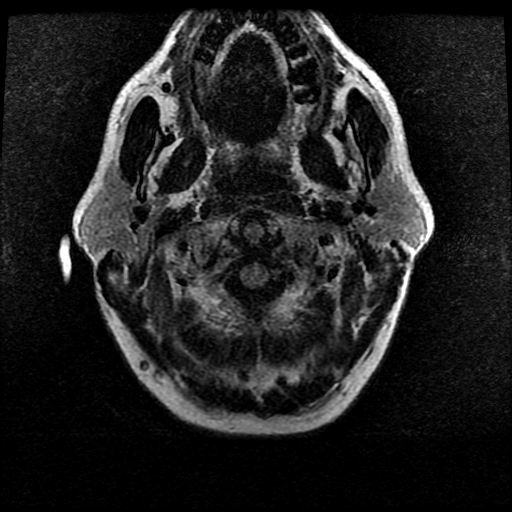
[im 28/28]
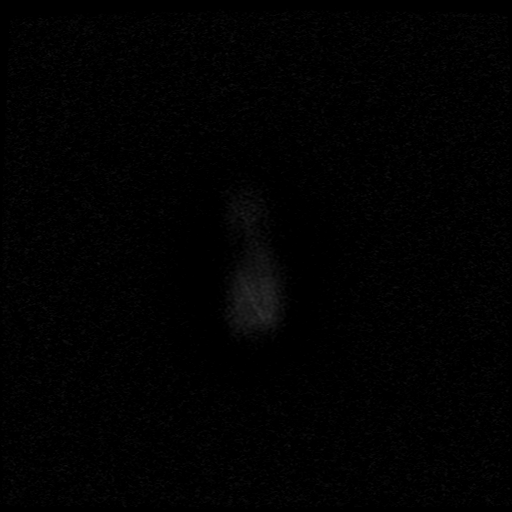

[Series 10: T1 · axial · 2.0mm · 0.47mm/px · z∈[-52,+0]mm · 3 of 94 slices shown (2 of 2)]
[im 1/94]
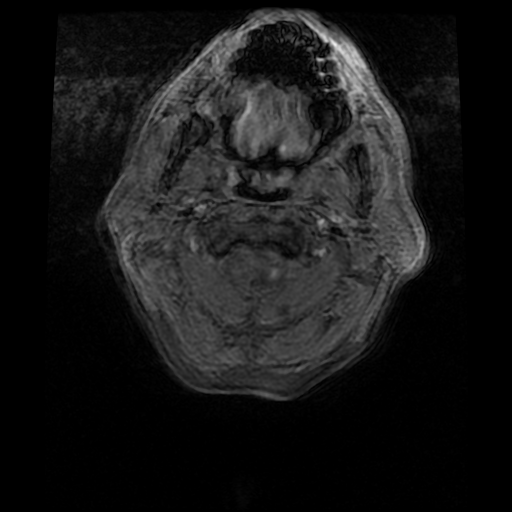
[im 14/94]
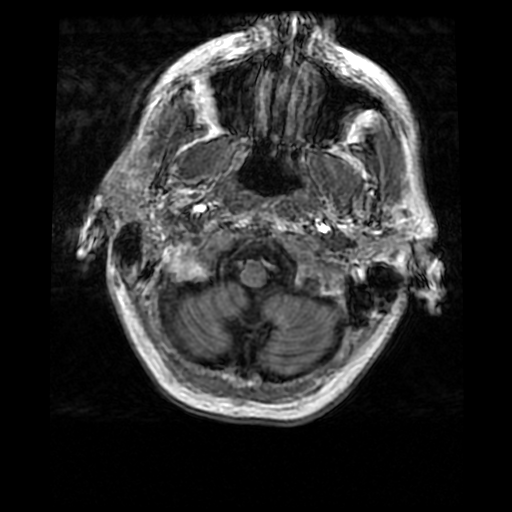
[im 27/94]
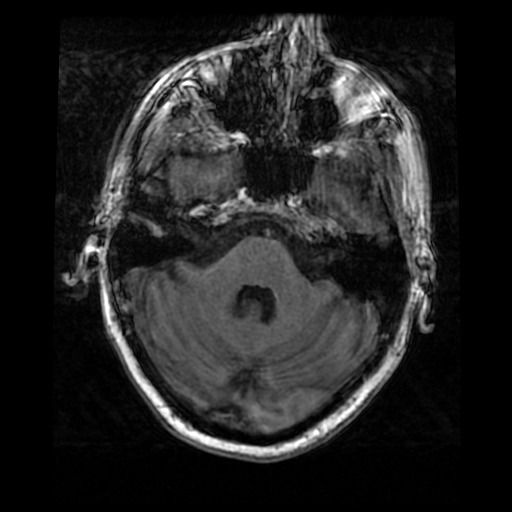

[Series 11: DWI · coronal · 4.0mm · 1.09mm/px · 8 of 94 slices shown (2 of 4)]
[im 1/94]
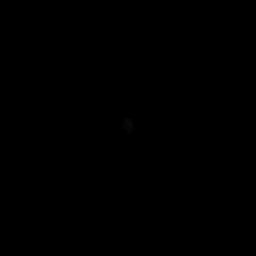
[im 14/94]
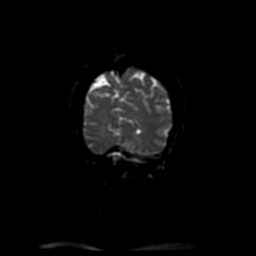
[im 27/94]
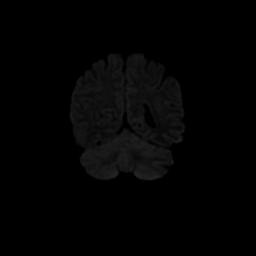
[im 40/94]
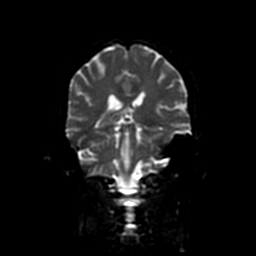
[im 54/94]
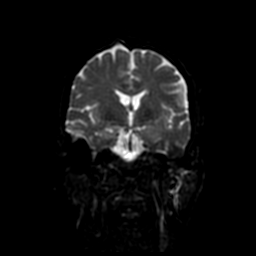
[im 67/94]
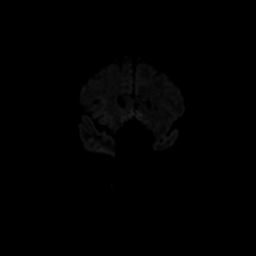
[im 80/94]
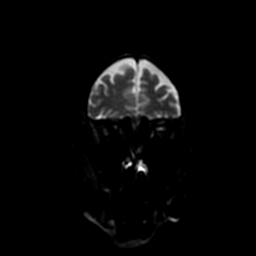
[im 94/94]
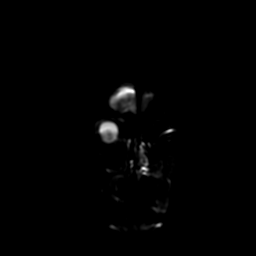

[Series 12: T2 · coronal · 5.0mm · 0.45mm/px · 3 of 32 slices shown (2 of 2)]
[im 1/32]
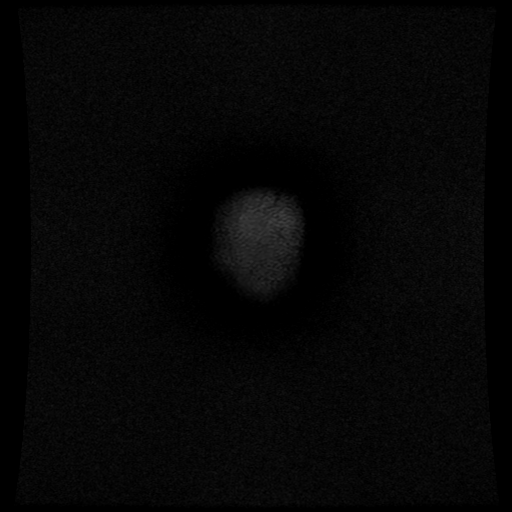
[im 16/32]
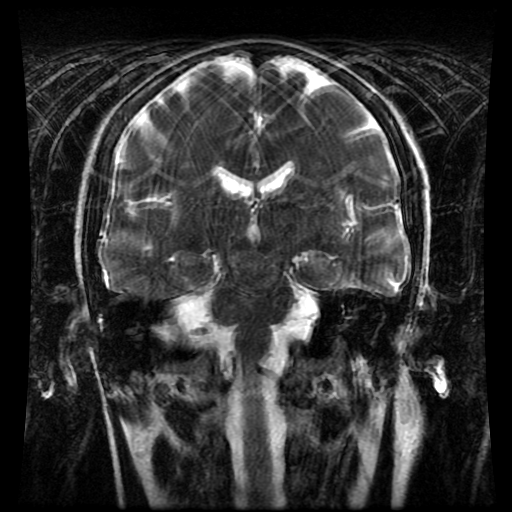
[im 32/32]
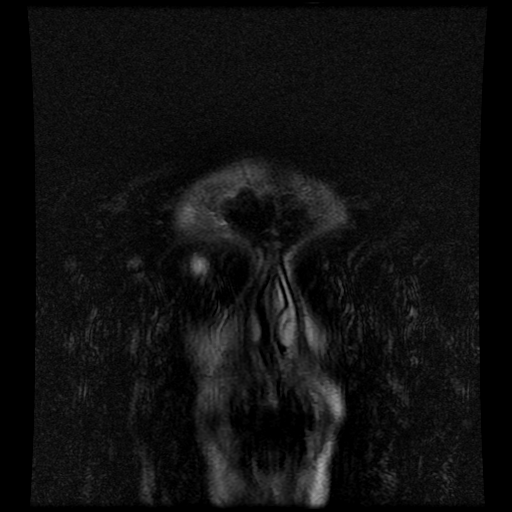

[Series 300: DWI · axial · 3.0mm · 1.09mm/px · z∈[-32,+141]mm · 5 of 59 slices shown (3 of 4)]
[im 1/59]
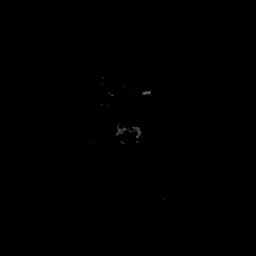
[im 15/59]
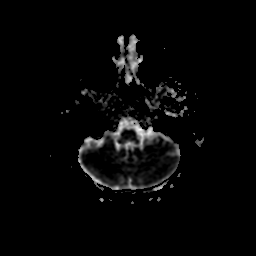
[im 30/59]
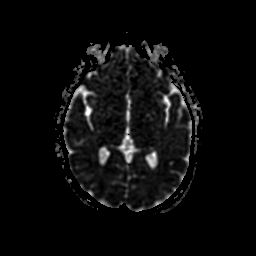
[im 44/59]
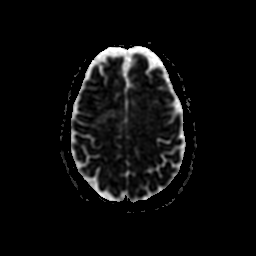
[im 59/59]
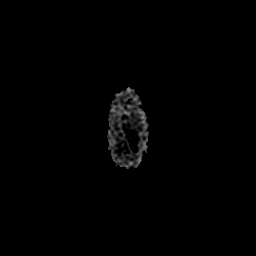

[Series 1100: DWI · coronal · 4.0mm · 1.09mm/px · 4 of 47 slices shown (4 of 4)]
[im 1/47]
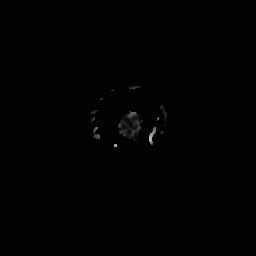
[im 16/47]
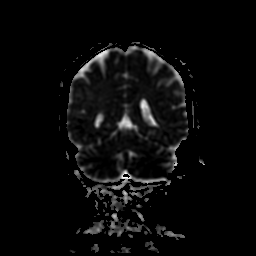
[im 31/47]
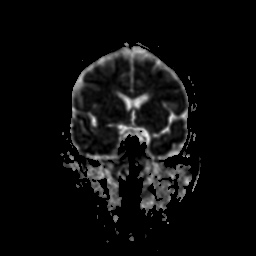
[im 47/47]
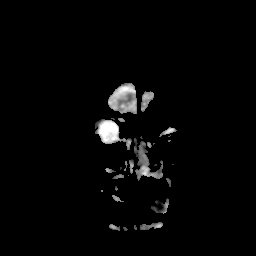

[37 of 48 positions shown; findings below may reference images not displayed]

FINDINGS: The study is mildly motion degraded.

Brain: There is no evidence of acute infarct, intracranial
hemorrhage, mass, midline shift, or extra-axial fluid collection.
The ventricles and sulci are within normal limits. The brain is
normal in signal.

Vascular: Major intracranial vascular flow voids are preserved.

Skull and upper cervical spine: Unremarkable bone marrow signal.

Sinuses/Orbits: Unremarkable orbits. Paranasal sinuses and mastoid
air cells are clear.

Other: None.
IMPRESSION: Negative brain MRI.

## 2020-10-30 ENCOUNTER — Other Ambulatory Visit: Payer: Self-pay | Admitting: Physician Assistant

## 2020-10-30 NOTE — Telephone Encounter (Signed)
Requested medication (s) are due for refill today: no  Requested medication (s) are on the active medication list: yes  Last refill: 10/17/2020  Future visit scheduled: yes   Notes to clinic: this refill cannot be delegated    Requested Prescriptions  Pending Prescriptions Disp Refills   Vitamin D, Ergocalciferol, (DRISDOL) 1.25 MG (50000 UNIT) CAPS capsule [Pharmacy Med Name: VITAMIN D2 1.25MG (50,000 UNIT)] 13 capsule 1    Sig: Take 1 capsule (50,000 Units total) by mouth every 7 (seven) days.      Endocrinology:  Vitamins - Vitamin D Supplementation Failed - 10/30/2020  8:30 AM      Failed - 50,000 IU strengths are not delegated      Failed - Vitamin D in normal range and within 360 days    Vit D, 25-Hydroxy  Date Value Ref Range Status  08/07/2020 22.4 (L) 30.0 - 100.0 ng/mL Final    Comment:    Vitamin D deficiency has been defined by the Institute of Medicine and an Endocrine Society practice guideline as a level of serum 25-OH vitamin D less than 20 ng/mL (1,2). The Endocrine Society went on to further define vitamin D insufficiency as a level between 21 and 29 ng/mL (2). 1. IOM (Institute of Medicine). 2010. Dietary reference    intakes for calcium and D. [place]: The    [employer]. 2. Holick MF, Binkley [place], Bischoff-Ferrari HA, et al.    Evaluation, treatment, and prevention of vitamin D    deficiency: an Endocrine Society clinical practice    guideline. JCEM. 2011 Jul; 96(7):1911-30.           Passed - Ca in normal range and within 360 days    Calcium  Date Value Ref Range Status  08/07/2020 10.0 8.7 - 10.2 mg/dL Final   Calcium, Ion  Date Value Ref Range Status  02/12/2014 1.13 1.12 - 1.23 mmol/L Final          Passed - Phosphate in normal range and within 360 days    Phosphorus  Date Value Ref Range Status  03/09/2020 3.1 2.5 - 4.6 mg/dL Final    Comment:    Performed at [facility], [address], [place], [place]  [address]          Passed - Valid encounter within last 12 months    Recent Outpatient Visits           2 months ago Essential hypertension   [place], [place], [place]   6 months ago Hospital discharge follow-up   [place], [place]   1 year ago Essential hypertension   [place], RPH-CPP   1 year ago Seizure-like activity [facility])   [place] Fulp, [place], MD   1 year ago Essential hypertension   [place], RPH-CPP       Future Appointments             In 6 days Ladell Pier, MD [place]

## 2020-11-05 ENCOUNTER — Ambulatory Visit: Payer: 59 | Admitting: Internal Medicine

## 2020-11-14 ENCOUNTER — Other Ambulatory Visit: Payer: Self-pay | Admitting: Physician Assistant

## 2020-11-14 NOTE — Telephone Encounter (Signed)
Requested medication (s) are due for refill today: yes  Requested medication (s) are on the active medication list: yes  Last refill:  08/08/20 #16  Future visit scheduled: no  Notes to clinic:  Please review for refill. Refill not delegated per protocol    Requested Prescriptions  Pending Prescriptions Disp Refills   Vitamin D, Ergocalciferol, (DRISDOL) 1.25 MG (50000 UNIT) CAPS capsule [Pharmacy Med Name: VITAMIN D2 1.25MG (50,000 UNIT)] 13 capsule 1    Sig: Take 1 capsule (50,000 Units total) by mouth every 7 (seven) days.      Endocrinology:  Vitamins - Vitamin D Supplementation Failed - 11/14/2020  7:55 AM      Failed - 50,000 IU strengths are not delegated      Failed - Vitamin D in normal range and within 360 days    Vit D, 25-Hydroxy  Date Value Ref Range Status  08/07/2020 22.4 (L) 30.0 - 100.0 ng/mL Final    Comment:    Vitamin D deficiency has been defined by the Institute of Medicine and an Endocrine Society practice guideline as a level of serum 25-OH vitamin D less than 20 ng/mL (1,2). The Endocrine Society went on to further define vitamin D insufficiency as a level between 21 and 29 ng/mL (2). 1. IOM (Institute of Medicine). 2010. Dietary reference    intakes for calcium and D. [place]: The    [employer]. 2. Holick MF, Binkley [place], Bischoff-Ferrari HA, et al.    Evaluation, treatment, and prevention of vitamin D    deficiency: an Endocrine Society clinical practice    guideline. JCEM. 2011 Jul; 96(7):1911-30.           Passed - Ca in normal range and within 360 days    Calcium  Date Value Ref Range Status  08/07/2020 10.0 8.7 - 10.2 mg/dL Final   Calcium, Ion  Date Value Ref Range Status  02/12/2014 1.13 1.12 - 1.23 mmol/L Final          Passed - Phosphate in normal range and within 360 days    Phosphorus  Date Value Ref Range Status  03/09/2020 3.1 2.5 - 4.6 mg/dL Final    Comment:    Performed at [facility], [address], [place], [place] [address]          Passed - Valid encounter within last 12 months    Recent Outpatient Visits           3 months ago Essential hypertension   [place], [place]   7 months ago Hospital discharge follow-up   [place], [place]   1 year ago Essential hypertension   [place], RPH-CPP   1 year ago Seizure-like activity [facility])   [place] Fulp, [place], MD   1 year ago Essential hypertension   [place], RPH-CPP

## 2020-11-15 ENCOUNTER — Other Ambulatory Visit: Payer: Self-pay | Admitting: Physician Assistant

## 2020-11-18 ENCOUNTER — Other Ambulatory Visit: Payer: Self-pay | Admitting: Physician Assistant

## 2020-11-18 ENCOUNTER — Ambulatory Visit: Payer: 59 | Admitting: Psychiatry

## 2020-11-18 DIAGNOSIS — I10 Essential (primary) hypertension: Secondary | ICD-10-CM

## 2020-11-19 ENCOUNTER — Ambulatory Visit: Payer: 59 | Admitting: Neurology

## 2020-11-19 NOTE — Telephone Encounter (Addendum)
Pt called in for further assistance. Pt say that medication was prescribed by Dr. Tama Gander. Pt says that he is almost out of this medication. Pt would like to know if provider could have this Rx sent in to pharmacy?    Pharmacy: [id number] [address], [place], [place] [address]  Phone:  [phone] Fax:  [phone]

## 2020-11-20 ENCOUNTER — Ambulatory Visit: Payer: 59 | Admitting: Psychiatry

## 2020-11-20 MED ORDER — PANTOPRAZOLE SODIUM 40 MG PO TBEC: 40.0000 mg | DELAYED_RELEASE_TABLET | Freq: Every day | ORAL | 2 refills | Status: DC

## 2020-11-20 NOTE — Addendum Note (Signed)
Addended by: Daisy Blossom, Annie Main L on: 11/20/2020 04:46 PM   Modules accepted: Orders

## 2020-11-20 NOTE — Addendum Note (Signed)
Addended by: Matilde Sprang on: 11/20/2020 02:46 PM   Modules accepted: Orders

## 2020-11-20 NOTE — Telephone Encounter (Signed)
Requested medication (s) are due for refill today: Yes  Requested medication (s) are on the active medication list: Yes/No (Pantoprazole not listed)  Last refill:  2021  Future visit scheduled: Yes  Notes to clinic:  Unable to refill per protocol, cannot delgate. See notes from patient attached.     Requested Prescriptions  Pending Prescriptions Disp Refills   Vitamin D, Ergocalciferol, (DRISDOL) 1.25 MG (50000 UNIT) CAPS capsule 16 capsule 0    Sig: Take 1 capsule (50,000 Units total) by mouth every 7 (seven) days.      Endocrinology:  Vitamins - Vitamin D Supplementation Failed - 11/20/2020  2:46 PM      Failed - 50,000 IU strengths are not delegated      Failed - Vitamin D in normal range and within 360 days    Vit D, 25-Hydroxy  Date Value Ref Range Status  08/07/2020 22.4 (L) 30.0 - 100.0 ng/mL Final    Comment:    Vitamin D deficiency has been defined by the Institute of Medicine and an Endocrine Society practice guideline as a level of serum 25-OH vitamin D less than 20 ng/mL (1,2). The Endocrine Society went on to further define vitamin D insufficiency as a level between 21 and 29 ng/mL (2). 1. IOM (Institute of Medicine). 2010. Dietary reference    intakes for calcium and D. [place]: The    [employer]. 2. Holick MF, Binkley [place], Bischoff-Ferrari HA, et al.    Evaluation, treatment, and prevention of vitamin D    deficiency: an Endocrine Society clinical practice    guideline. JCEM. 2011 Jul; 96(7):1911-30.           Passed - Ca in normal range and within 360 days    Calcium  Date Value Ref Range Status  08/07/2020 10.0 8.7 - 10.2 mg/dL Final   Calcium, Ion  Date Value Ref Range Status  02/12/2014 1.13 1.12 - 1.23 mmol/L Final          Passed - Phosphate in normal range and within 360 days    Phosphorus  Date Value Ref Range Status  03/09/2020 3.1 2.5 - 4.6 mg/dL Final    Comment:    Performed at [facility], [address] [place], [place] [address]          Passed - Valid encounter within last 12 months    Recent Outpatient Visits           3 months ago Essential hypertension   [place], [place]   7 months ago Hospital discharge follow-up   [place], MD   1 year ago Essential hypertension   [place], RPH-CPP   1 year ago Seizure-like activity [facility])   [place] Fulp, [place], [place]   1 year ago Essential hypertension   [place], RPH-CPP       Future Appointments             In 1 month [place], Dionne Bucy, [place]               pantoprazole (PROTONIX) 40 MG tablet      Sig: Take 1 tablet (40 mg total) by mouth daily.      Gastroenterology: Proton Pump Inhibitors Passed - 11/20/2020  2:46  PM      Passed - Valid encounter within last 12 months    Recent Outpatient Visits           3 months ago Essential hypertension   [place], [place]   7 months ago Hospital discharge follow-up   [place], MD   1 year ago Essential hypertension   [place], RPH-CPP   1 year ago Seizure-like activity [facility])   [place], MD   1 year ago Essential hypertension   [place], RPH-CPP       Future Appointments             In 1 month [place], Dionne Bucy, PA-C [place]              Refused Prescriptions Disp Refills   pantoprazole (PROTONIX) 40 MG tablet [Pharmacy Med Name: PANTOPRAZOLE SOD DR 40 MG TAB] 90 tablet 1    Sig: TAKE 1 TABLET BY MOUTH EVERY  DAY      Gastroenterology: Proton Pump Inhibitors Passed - 11/20/2020  2:46 PM      Passed - Valid encounter within last 12 months    Recent Outpatient Visits           3 months ago Essential hypertension   [place], [place], [place]   7 months ago Hospital discharge follow-up   [place] Antony Blackbird, MD   1 year ago Essential hypertension   [place], RPH-CPP   1 year ago Seizure-like activity [facility])   [place] Fulp, [place], MD   1 year ago Essential hypertension   [place], RPH-CPP       Future Appointments             In 1 month [place], Dionne Bucy, PA-C [place]

## 2020-11-20 NOTE — Telephone Encounter (Addendum)
Patient called and advised that Pantoprazole is not listed on his current medication list because on 11/16/2, it's noted he called into the office to report this medication was causing joint pain, so that provider discontinued and changed to Pepcid. He says he remembers that, but when he came to the office to see Freeman Caldron in February, she reordered it for him. He says he has been taking it and it works better than pepcid. He says he has been buying nexium, but it's costly when he can receive pantoprazole for free. He also says there are no refills on the vitamin D and he needs that for his spine. I advised I will send to [facility] for refill. Advised Vitamin D level needs to be checked before refill, appointment scheduled for tomorrow at 1400 for lab and 12/26/20 as f/u with Freeman Caldron, [place].

## 2020-11-21 ENCOUNTER — Other Ambulatory Visit: Payer: 59

## 2020-11-28 ENCOUNTER — Other Ambulatory Visit: Payer: 59

## 2020-12-03 ENCOUNTER — Other Ambulatory Visit: Payer: Self-pay

## 2020-12-03 ENCOUNTER — Ambulatory Visit: Payer: 59 | Attending: Family Medicine

## 2020-12-04 ENCOUNTER — Telehealth: Payer: Self-pay | Admitting: Psychiatry

## 2020-12-04 ENCOUNTER — Other Ambulatory Visit: Payer: Self-pay

## 2020-12-04 DIAGNOSIS — F4010 Social phobia, unspecified: Secondary | ICD-10-CM

## 2020-12-04 DIAGNOSIS — F5105 Insomnia due to other mental disorder: Secondary | ICD-10-CM

## 2020-12-04 DIAGNOSIS — F410 Panic disorder [episodic paroxysmal anxiety] without agoraphobia: Secondary | ICD-10-CM

## 2020-12-04 LAB — VITAMIN D 25 HYDROXY (VIT D DEFICIENCY, FRACTURES): Vit D, 25-Hydroxy: 53.7 ng/mL (ref 30.0–100.0)

## 2020-12-04 NOTE — Telephone Encounter (Signed)
Pt called we moved apt up to 6/23. No show 5/25. Will be out of Alprazolam next Friday 6/17.  Requesting Rx to [facility].  Contact # [phone]

## 2020-12-04 NOTE — Telephone Encounter (Signed)
Last filled 5/21 so he should not be out on 6/18.pend anyway with date 6/20?

## 2020-12-05 MED ORDER — ALPRAZOLAM 1 MG PO TABS: 1.0000 mg | ORAL_TABLET | Freq: Three times a day (TID) | ORAL | 0 refills | Status: DC | PRN

## 2020-12-05 NOTE — Telephone Encounter (Signed)
I sent you a message on him yesterday because I thought it was too early and you said to pend with date of 6/18

## 2020-12-05 NOTE — Telephone Encounter (Signed)
11/16/20

## 2020-12-05 NOTE — Telephone Encounter (Signed)
Reviewed

## 2020-12-11 ENCOUNTER — Other Ambulatory Visit: Payer: Self-pay | Admitting: Physician Assistant

## 2020-12-11 NOTE — Telephone Encounter (Signed)
Requested medication (s) are due for refill today: yes   Requested medication (s) are on the active medication list: yes   Last refill: 10/17/2020  Future visit scheduled: yes   Notes to clinic: this refill cannot be delegated    Requested Prescriptions  Pending Prescriptions Disp Refills   Vitamin D, Ergocalciferol, (DRISDOL) 1.25 MG (50000 UNIT) CAPS capsule [Pharmacy Med Name: VITAMIN D2 1.25MG (50,000 UNIT)] 13 capsule 1    Sig: Take 1 capsule (50,000 Units total) by mouth every 7 (seven) days.      Endocrinology:  Vitamins - Vitamin D Supplementation Failed - 12/11/2020  8:51 AM      Failed - 50,000 IU strengths are not delegated      Passed - Ca in normal range and within 360 days    Calcium  Date Value Ref Range Status  08/07/2020 10.0 8.7 - 10.2 mg/dL Final   Calcium, Ion  Date Value Ref Range Status  02/12/2014 1.13 1.12 - 1.23 mmol/L Final          Passed - Phosphate in normal range and within 360 days    Phosphorus  Date Value Ref Range Status  03/09/2020 3.1 2.5 - 4.6 mg/dL Final    Comment:    Performed at [facility], [address] [place], [place] [address]          Passed - Vitamin D in normal range and within 360 days    Vit D, 25-Hydroxy  Date Value Ref Range Status  12/03/2020 53.7 30.0 - 100.0 ng/mL Final    Comment:    Vitamin D deficiency has been defined by the [place] practice guideline as a level of serum 25-OH vitamin D less than 20 ng/mL (1,2). The Endocrine Society went on to further define vitamin D insufficiency as a level between 21 and 29 ng/mL (2). 1. IOM (Institute of Medicine). 2010. Dietary reference    intakes for calcium and D. [place]: The    [employer]. 2. Holick MF, Binkley [place], Bischoff-Ferrari HA, et al.    Evaluation, treatment, and prevention of vitamin D    deficiency: an Endocrine Society clinical practice    guideline. JCEM. 2011 Jul;  96(7):1911-30.           Passed - Valid encounter within last 12 months    Recent Outpatient Visits           4 months ago Essential hypertension   [place], [place], [place]   8 months ago Hospital discharge follow-up   [place], MD   1 year ago Essential hypertension   [place], RPH-CPP   1 year ago Seizure-like activity [facility])   [place] Fulp, [place], MD   1 year ago Essential hypertension   [place], RPH-CPP       Future Appointments             In 2 weeks Thereasa Solo, Dionne Bucy, PA-C [place]

## 2020-12-17 ENCOUNTER — Ambulatory Visit: Payer: 59 | Admitting: Psychiatry

## 2020-12-19 ENCOUNTER — Ambulatory Visit (INDEPENDENT_AMBULATORY_CARE_PROVIDER_SITE_OTHER): Payer: 59 | Admitting: Psychiatry

## 2020-12-19 ENCOUNTER — Encounter: Payer: Self-pay | Admitting: Psychiatry

## 2020-12-19 ENCOUNTER — Other Ambulatory Visit: Payer: Self-pay

## 2020-12-19 DIAGNOSIS — F5105 Insomnia due to other mental disorder: Secondary | ICD-10-CM

## 2020-12-19 DIAGNOSIS — F410 Panic disorder [episodic paroxysmal anxiety] without agoraphobia: Secondary | ICD-10-CM | POA: Diagnosis not present

## 2020-12-19 DIAGNOSIS — F4010 Social phobia, unspecified: Secondary | ICD-10-CM

## 2020-12-19 MED ORDER — ALPRAZOLAM 1 MG PO TABS: 1.0000 mg | ORAL_TABLET | Freq: Three times a day (TID) | ORAL | 2 refills | Status: DC | PRN

## 2020-12-21 MED ORDER — DAYVIGO 5 MG PO TABS: 5.0000 mg | ORAL_TABLET | Freq: Every day | ORAL | 0 refills | Status: DC

## 2020-12-21 NOTE — Progress Notes (Signed)
Donald Jenkins [id number] Jan 22, 1971 50 y.o.  Subjective:   Patient ID:  Donald Jenkins is a 50 y.o. (DOB [birth year]) male.  Chief Complaint:  Chief Complaint  Patient presents with   Insomnia   Anxiety   HPI Donald Jenkins presents to the office today for follow-up of anxiety and insomnia. He reports that he is doing well aside from insomnia. He reports that he finally had a good week of sleep last week. He reports that he has prolonged periods of sleeplessness. Recently had 6 day period of severe insomnia. He reports that he would not fall asleep until the morning and then would be awakened by severe nightmares. He reports that he has difficulty returning to sleep once awakened and is unable to sleep if there is any exposure to sunlight. He has tried avoiding blue light exposure and wearing blue light blocking glasses. Has put up back-out curtains.  Goes to bed at 9 pm. Avoids caffeine and sugar. He reports that since back surgery of June 2020 he has had insomnia and anxiety. Unable to take naps.   He is now able to drive and is looking for work. He reports anxiety in response to finding work. He reports worry and anxious. Denies any recent panic attacks. He reports that he has anxious thoughts once he lays down to go to sleep. He reports depressed mood at times with periods of tearfulness. Energy has been ok. Motivation is ok when focused on work, "without work my motivation is terrible." He reports adequate concentration. He reports losing 70 lbs intentionally several years ago. He has been trying to lose weight. Appetite has been good except when he has severe insomnia.   Denies SI.   He reports that he takes Xanax prn.   Past Psychiatric Medication Trials: Wellbutrin Paxil Zoloft- increased anxiety BuSpar- side effects (head swimming) Effexor  Trazodone- priapism Remeron   Cymbalta- No improvement Lamictal- Reports worsening insomnia and  anxiety Depakote Keppra Seroquel Ambien-Parasomnias Lunesta Sonata- Helped for a period of time Xanax- reports that this is helpful for social anxiety, generalized anxiety, and insomnia. Valium Ativan Ritalin   GAD-7     Flowsheet Row Office Visit from 04/15/2020 in [place] Office Visit from 05/30/2019 in [place] Office Visit from 12/22/2018 in [place]  Total GAD-7 Score 19 7 1          PHQ2-9     [place] Office Visit from 04/15/2020 in [place] Office Visit from 05/30/2019 in [place] Office Visit from 12/22/2018 in [place] from 10/25/2018 in Primary Care at [place] from 07/08/2017 in [facility] at [facility] Total Score 5 2 0 0 0  PHQ-9 Total Score 19 7 2  -- --           Review of Systems:  Review of Systems Musculoskeletal:  Negative for gait problem. Neurological:  Negative for seizures. Psychiatric/Behavioral:         Please refer to HPI  Medications: I have reviewed the patient's current medications.  Current Outpatient Medications  Medication Sig Dispense Refill   famotidine (PEPCID) 20 MG tablet TAKE 1 TABLET (20 MG TOTAL) BY MOUTH 2 (TWO) TIMES DAILY. TO REDUCE STOMACH ACID 180 tablet 0   levETIRAcetam (KEPPRA) 500 MG tablet Take 1 tablet (500 mg total) by mouth 2 (two) times daily.  180 tablet 3   lisinopril-hydrochlorothiazide (ZESTORETIC) 10-12.5 MG tablet TAKE 1 TABLET BY MOUTH DAILY. TO LOWER BLOOD PRESSURE 90 tablet 0   MAGNESIUM PO Take by mouth.     pantoprazole (PROTONIX) 40 MG tablet Take 1 tablet (40 mg total) by mouth daily. 30 tablet 2   thiamine 100 MG tablet Take 1 tablet (100 mg total) by mouth daily. 30 tablet 0   UNABLE TO FIND Serrapeptase     VITAMIN D-VITAMIN K PO Take by mouth.     [START ON 01/13/2021] ALPRAZolam  (XANAX) 1 MG tablet Take 1 tablet (1 mg total) by mouth 3 (three) times daily as needed for anxiety or sleep (Panic). 90 tablet 2   amLODipine (NORVASC) 2.5 MG tablet Take 1 tablet (2.5 mg total) by mouth daily. To lower blood pressure (Patient taking differently: Take 1.25 mg by mouth daily as needed. To lower blood pressure) 90 tablet 0   levocetirizine (XYZAL) 5 MG tablet TAKE 1 TABLET (5 MG TOTAL) BY MOUTH EVERY EVENING. TO HELP WITH NASAL ALLERGIES (Patient not taking: Reported on 12/19/2020) 90 tablet 1   ondansetron (ZOFRAN-ODT) 4 MG disintegrating tablet Take 1 tablet (4 mg total) by mouth every 8 (eight) hours as needed for nausea or vomiting. 20 tablet 0   potassium chloride (KLOR-CON) 10 MEQ tablet Take 10 mEq by mouth 2 (two) times daily. (Patient not taking: Reported on 12/19/2020)     Vitamin D, Ergocalciferol, (DRISDOL) 1.25 MG (50000 UNIT) CAPS capsule Take 1 capsule (50,000 Units total) by mouth every 7 (seven) days. (Patient not taking: Reported on 12/19/2020) 16 capsule 0   No current facility-administered medications for this visit.    Medication Side Effects: None  Allergies:  Allergies  Allergen Reactions   Penicillins Other (See Comments)    Did it involve swelling of the face/tongue/throat, SOB, or low BP? Unknown Did it involve sudden or severe rash/hives, skin peeling, or any reaction on the inside of your mouth or nose? Unknown Did you need to seek medical attention at a hospital or doctor's office? Unknown When did it last happen?      childhood If all above answers are "NO", may proceed with cephalosporin use.     Past Medical History:  Diagnosis Date   Anxiety    social   Hiatal hernia 2003   Hypertension    T8 vertebral fracture (HCC)    Tachycardia    Tumors    on spine    Past Medical History, Surgical history, Social history, and Family history were reviewed and updated as appropriate.   Please see review of systems for further details on the  patient's review from today.   Objective:   Physical Exam:  There were no vitals taken for this visit.  Physical Exam Constitutional:      General: He is not in acute distress. Musculoskeletal:        General: No deformity.  Neurological:     Mental Status: He is alert and oriented to person, place, and time.     Coordination: Coordination normal.  Psychiatric:        Attention and Perception: Attention and perception normal. He does not perceive auditory or visual hallucinations.        Mood and Affect: Mood is anxious. Affect is not labile, blunt, angry or inappropriate.        Speech: Speech normal.        Behavior: Behavior normal.        Thought  Content: Thought content normal. Thought content is not paranoid or delusional. Thought content does not include homicidal or suicidal ideation. Thought content does not include homicidal or suicidal plan.        Cognition and Memory: Cognition and memory normal.        Judgment: Judgment normal.     Comments: Insight intact Dysthymic mood    Lab Review:     Component Value Date/Time   NA 143 08/07/2020 1103   K 4.4 08/07/2020 1103   CL 100 08/07/2020 1103   CO2 21 08/07/2020 1103   GLUCOSE 94 08/07/2020 1103   GLUCOSE 117 (H) 03/09/2020 0445   BUN 14 08/07/2020 1103   CREATININE 1.01 08/07/2020 1103   CREATININE 1.05 01/14/2016 0838   CALCIUM 10.0 08/07/2020 1103   PROT 7.7 08/07/2020 1103   ALBUMIN 4.8 08/07/2020 1103   AST 16 08/07/2020 1103   ALT 21 08/07/2020 1103   ALKPHOS 121 08/07/2020 1103   BILITOT 0.4 08/07/2020 1103   GFRNONAA 88 08/07/2020 1103   GFRNONAA 86 01/14/2016 0838   GFRAA 101 08/07/2020 1103   GFRAA >89 01/14/2016 0838       Component Value Date/Time   WBC 7.2 08/07/2020 1103   WBC 7.3 03/09/2020 0445   RBC 5.55 08/07/2020 1103   RBC 4.06 (L) 03/09/2020 0445   HGB 15.7 08/07/2020 1103   HCT 46.5 08/07/2020 1103   PLT 280 08/07/2020 1103   MCV 84 08/07/2020 1103   MCH 28.3 08/07/2020  1103   MCH 31.3 03/09/2020 0445   MCHC 33.8 08/07/2020 1103   MCHC 36.4 (H) 03/09/2020 0445   RDW 13.5 08/07/2020 1103   LYMPHSABS 1.9 08/07/2020 1103   MONOABS 0.8 03/09/2020 0445   EOSABS 0.2 08/07/2020 1103   BASOSABS 0.1 08/07/2020 1103    No results found for: POCLITH, LITHIUM   No results found for: PHENYTOIN, PHENOBARB, VALPROATE, CBMZ   .res Assessment: Plan:    Patient seen for 30 minutes and time spent counseling patient regarding chronic insomnia.  Discussed considering trial of an orexin antagonist since insomnia has not responded to sleep medications with other mechanisms of action. Discussed potential benefits, risks, and and side effects of Dayvigo for insomnia.  Patient agrees to trial of Dayvigo.  Patient provided with Dayvigo 5 mg samples and advised to take 1 tablet by mouth at bedtime, and to increase to 2 tabs after 3 to 5 days if 5 mg dose is ineffective.  Patient advised to contact office if Dayvigo is effective and to request a script for the dose that is effective. Will continue Xanax 1 mg 3 times daily as needed for anxiety or sleep. Recommend continuing psychotherapy with [name], PhD. Patient to follow-up with this provider in 6 weeks or sooner if clinically indicated. Patient advised to contact office with any questions, adverse effects, or acute worsening in signs and symptoms.   Quan was seen today for insomnia and anxiety.  Diagnoses and all orders for this visit:  Panic disorder -     ALPRAZolam (XANAX) 1 MG tablet; Take 1 tablet (1 mg total) by mouth 3 (three) times daily as needed for anxiety or sleep (Panic).  Social anxiety disorder -     ALPRAZolam (XANAX) 1 MG tablet; Take 1 tablet (1 mg total) by mouth 3 (three) times daily as needed for anxiety or sleep (Panic).  Insomnia disorder, with non-sleep disorder mental comorbidity, persistent -     ALPRAZolam (XANAX) 1 MG tablet; Take  1 tablet (1 mg total) by mouth 3 (three) times  daily as needed for anxiety or sleep (Panic).     Please see After Visit Summary for patient specific instructions.  Future Appointments  Date Time Provider [place]  12/26/2020  9:50 AM Argentina Donovan, PA-C [facility] None  01/14/2021  8:00 AM Blanchie Serve, PhD [facility] None  01/28/2021 11:30 AM Thayer Headings, PMHNP [facility] None    No orders of the defined types were placed in this encounter.   -------------------------------

## 2020-12-26 ENCOUNTER — Other Ambulatory Visit: Payer: Self-pay

## 2020-12-26 ENCOUNTER — Ambulatory Visit: Payer: 59 | Attending: Physician Assistant | Admitting: Physician Assistant

## 2020-12-26 DIAGNOSIS — I10 Essential (primary) hypertension: Secondary | ICD-10-CM

## 2020-12-26 MED ORDER — LISINOPRIL-HYDROCHLOROTHIAZIDE 10-12.5 MG PO TABS: ORAL_TABLET | ORAL | 0 refills | Status: DC

## 2020-12-26 NOTE — Progress Notes (Signed)
Patient ID: Donald Jenkins, male   DOB: Feb 28, 1971, 50 y.o.   MRN: [id number] homeVirtual Visit via Telephone Note  I connected with Donald Jenkins on 12/26/20 at  9:50 AM EDT by telephone and verified that I am speaking with the correct person using two identifiers.  Location: Patient: home Provider: [facility] office   I discussed the limitations, risks, security and privacy concerns of performing an evaluation and management service by telephone and the availability of in person appointments. I also discussed with the patient that there may be a patient responsible charge related to this service. The patient expressed understanding and agreed to proceed.   History of Present Illness:  patient need RF lisinopril/HCT.  He is not taking amlodipine any loner.  Says BP OOO is about 120s/80s.  He is doing well.  No CP/SOB/HA/Dizziness    Observations/Objective:  NAD.  A&Ox3   Assessment and Plan: 1. Essential hypertension Controlled-contiue - lisinopril-hydrochlorothiazide (ZESTORETIC) 10-12.5 MG tablet; 1 daily  Dispense: 90 tablet; Refill: 0    Follow Up Instructions: Patient still needs to be assigned PCP in 2-3 months for chronic conditions and bloodwork   I discussed the assessment and treatment plan with the patient. The patient was provided an opportunity to ask questions and all were answered. The patient agreed with the plan and demonstrated an understanding of the instructions.   The patient was advised to call back or seek an in-person evaluation if the symptoms worsen or if the condition fails to improve as anticipated.  I provided 9 minutes of non-face-to-face time during this encounter.   Freeman Caldron, PA-C

## 2020-12-27 ENCOUNTER — Telehealth: Payer: Self-pay | Admitting: Physician Assistant

## 2020-12-27 NOTE — Telephone Encounter (Signed)
Pt is calling to receive his Vit D. Results. Please advise [phone]

## 2020-12-31 NOTE — Telephone Encounter (Signed)
Patient was called and no VM was set up.Patient has been sent a [employer].

## 2021-01-03 ENCOUNTER — Ambulatory Visit: Payer: 59 | Admitting: Psychiatry

## 2021-01-14 ENCOUNTER — Other Ambulatory Visit: Payer: Self-pay

## 2021-01-14 ENCOUNTER — Ambulatory Visit (INDEPENDENT_AMBULATORY_CARE_PROVIDER_SITE_OTHER): Payer: 59 | Admitting: Psychiatry

## 2021-01-14 DIAGNOSIS — F5105 Insomnia due to other mental disorder: Secondary | ICD-10-CM

## 2021-01-14 DIAGNOSIS — F4010 Social phobia, unspecified: Secondary | ICD-10-CM | POA: Diagnosis not present

## 2021-01-14 DIAGNOSIS — F411 Generalized anxiety disorder: Secondary | ICD-10-CM

## 2021-01-14 DIAGNOSIS — S22000A Wedge compression fracture of unspecified thoracic vertebra, initial encounter for closed fracture: Secondary | ICD-10-CM

## 2021-01-14 DIAGNOSIS — F331 Major depressive disorder, recurrent, moderate: Secondary | ICD-10-CM

## 2021-01-14 DIAGNOSIS — R6510 Systemic inflammatory response syndrome (SIRS) of non-infectious origin without acute organ dysfunction: Secondary | ICD-10-CM

## 2021-01-14 DIAGNOSIS — F410 Panic disorder [episodic paroxysmal anxiety] without agoraphobia: Secondary | ICD-10-CM

## 2021-01-14 DIAGNOSIS — G40909 Epilepsy, unspecified, not intractable, without status epilepticus: Secondary | ICD-10-CM
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
## 2021-01-23 ENCOUNTER — Telehealth: Payer: Self-pay | Admitting: Psychiatry

## 2021-01-23 NOTE — Telephone Encounter (Signed)
Pt called regarding a letter needed for an upcoming hearing. States that it was discussed at last appt and didn't wish to go into detail about the letter and that AM would know about the letter. Ph: [phone]. The hearing is 8/5 and would need letter before than.

## 2021-01-23 NOTE — Telephone Encounter (Signed)
I gave Donald Jenkins a call back and he says that he has a disability hearing on 8/5. He states at last appt he was told that you would be happy to write a letter " of whatever would be appropriate" concerning this matter. He would need letter before the hearing on January 31, 2021.

## 2021-01-24 NOTE — Telephone Encounter (Signed)
Attempted return call 3:45p today.  Despite odd ring pattern and seeming to disconnect, reached voicemail.  LM indicating willingness in principle to write in support of his application but need confirmation that pre-hearing is actually the appropriate time for a note, and if so, what the scope and addressee would be.  Normal process is that DDS requests records, and a cover note may be added, so it may be jumping the gun or duplicating effort at his expense to write ahead of the phone hearing.  [name], PhD LP Clinical Psychologist, [facility] [address] [place], [place] [address] [phone]

## 2021-01-28 ENCOUNTER — Ambulatory Visit: Payer: 59 | Admitting: Psychiatry

## 2021-01-30 ENCOUNTER — Other Ambulatory Visit: Payer: Self-pay | Admitting: Internal Medicine

## 2021-01-30 DIAGNOSIS — K219 Gastro-esophageal reflux disease without esophagitis: Secondary | ICD-10-CM

## 2021-01-30 NOTE — Telephone Encounter (Signed)
Requested medications are due for refill today.  yes  Requested medications are on the active medications list.  yes  Last refill. 09/04/2020  Future visit scheduled.   no  Notes to clinic.  No PCP is listed.

## 2021-02-13 ENCOUNTER — Other Ambulatory Visit: Payer: Self-pay | Admitting: Family Medicine

## 2021-02-13 NOTE — Telephone Encounter (Signed)
  Notes to clinic:  Review for continued use  Looks like patient was suppose to be be referred to gastro    Requested Prescriptions  Pending Prescriptions Disp Refills   pantoprazole (PROTONIX) 40 MG tablet [Pharmacy Med Name: PANTOPRAZOLE SOD DR 40 MG TAB] 90 tablet     Sig: TAKE 1 TABLET BY MOUTH EVERY DAY     Gastroenterology: Proton Pump Inhibitors Passed - 02/13/2021  8:56 AM      Passed - Valid encounter within last 12 months    Recent Outpatient Visits           1 month ago Essential hypertension   [place], [place], [place]   6 months ago Essential hypertension   [place], [place], [place]   10 months ago Hospital discharge follow-up   [place] Antony Blackbird, MD   1 year ago Essential hypertension   [place], RPH-CPP   1 year ago Seizure-like activity [facility])   [facility] Antony Blackbird, MD

## 2021-02-25 ENCOUNTER — Ambulatory Visit: Payer: 59 | Admitting: Psychiatry

## 2021-03-08 ENCOUNTER — Other Ambulatory Visit: Payer: Self-pay | Admitting: Family Medicine

## 2021-03-09 NOTE — Telephone Encounter (Signed)
Requested medication (s) are due for refill today: yes  Requested medication (s) are on the active medication list: yes  Last refill:  02/13/21  Future visit scheduled: no  Notes to clinic:  Called pt and no VM- unanswerd   Requested Prescriptions  Pending Prescriptions Disp Refills   pantoprazole (PROTONIX) 40 MG tablet [Pharmacy Med Name: PANTOPRAZOLE SOD DR 40 MG TAB] 30 tablet 0    Sig: TAKE 1 TABLET BY MOUTH EVERY DAY     Gastroenterology: Proton Pump Inhibitors Passed - 03/08/2021  2:30 PM      Passed - Valid encounter within last 12 months    Recent Outpatient Visits           2 months ago Essential hypertension   [place], [place], [place]   7 months ago Essential hypertension   [place], [place]   10 months ago Hospital discharge follow-up   [place] Antony Blackbird, MD   1 year ago Essential hypertension   [place], RPH-CPP   1 year ago Seizure-like activity [facility])   [facility] Antony Blackbird, MD

## 2021-03-12 ENCOUNTER — Ambulatory Visit (INDEPENDENT_AMBULATORY_CARE_PROVIDER_SITE_OTHER): Payer: 59 | Admitting: Psychiatry

## 2021-03-12 ENCOUNTER — Encounter: Payer: Self-pay | Admitting: Psychiatry

## 2021-03-12 ENCOUNTER — Other Ambulatory Visit: Payer: Self-pay

## 2021-03-12 VITALS — Wt 215.0 lb

## 2021-03-12 DIAGNOSIS — F4010 Social phobia, unspecified: Secondary | ICD-10-CM

## 2021-03-12 DIAGNOSIS — F410 Panic disorder [episodic paroxysmal anxiety] without agoraphobia: Secondary | ICD-10-CM

## 2021-03-12 DIAGNOSIS — F5105 Insomnia due to other mental disorder: Secondary | ICD-10-CM | POA: Diagnosis not present

## 2021-03-12 MED ORDER — TEMAZEPAM 15 MG PO CAPS: ORAL_CAPSULE | ORAL | 0 refills | Status: DC

## 2021-03-12 MED ORDER — ALPRAZOLAM 1 MG PO TABS: 1.0000 mg | ORAL_TABLET | Freq: Three times a day (TID) | ORAL | 4 refills | Status: DC | PRN

## 2021-03-12 NOTE — Progress Notes (Signed)
Donald Jenkins [id number] 1971-03-26 50 y.o.  Subjective:   Patient ID:  Donald Jenkins is a 49 y.o. (DOB March 15, 1971) male.  Chief Complaint:  Chief Complaint  Patient presents with   Insomnia   Anxiety    HPI Donald Jenkins presents to the office today for follow-up of insomnia and anxiety. He reports continued insomnia. He reports periods of sleeplessness for several days. He reports that sometimes he goes to bed around 9:30 pm and may not fall asleep until 5 am. He reports that when he falls asleep at 5 am he will be awakened 2-3 hours later with intense nightmares. Recently increased dose of Magnesium to 500 mg po QHS. He reports that he has slept better the last few nights and is not sure if it is due to increase in magnesium or due to sleep deprivation. He reports that samples of both Dayvigo and Belsomra were ineffective. Reports severe insomnia for 9-10 years and it seems to be worsening. Denies RLS. He reports that he has implemented multiple sleep hygiene strategies with little to no improvement. Denies taking naps.  He reports severe social anxiety and feels "like everybody is staring at me." He reports that he stays at home most of the time due to anxiety. He reports that he has some panic after severe sleep deprivation. He reports frequent worry and rumination. He reports frustration in response to general situation. He reports depressed mood. He reports energy is low in response to decreased sleep. Reports that he has lost some weight. He reports that he would like to lose an additional 25-35 lbs. Concentration is adequate. Denies SI.   Taking Xanax prn only as needed for panic. He reports using Xanax prn less since he has ben isolated and having limited social interaction, which is usual cause for his anxiety.   Past Psychiatric Medication Trials: Wellbutrin Paxil Zoloft- increased anxiety BuSpar- side effects (head swimming) Effexor  Trazodone- priapism Remeron    Cymbalta- No improvement Lamictal- Reports worsening insomnia and anxiety Depakote Keppra Seroquel Ambien-Parasomnias Lunesta Sonata- Helped for a period of time Belsomra-Ineffective Dayvigo-Ineffective Xanax- reports that this is helpful for social anxiety, generalized anxiety, and insomnia. Valium Ativan Ritalin  GAD-7    Flowsheet Row Office Visit from 04/15/2020 in [place] Office Visit from 05/30/2019 in [place] Office Visit from 12/22/2018 in [place]  Total GAD-7 Score '19 7 1      '$ PHQ2-9    [place] Office Visit from 04/15/2020 in [facility] Office Visit from 05/30/2019 in [place] Office Visit from 12/22/2018 in [place] from 10/25/2018 in [facility] at [place] from 07/08/2017 in Primary Care at [facility] Total Score 5 2 0 0 0  PHQ-9 Total Score '19 7 2 '$ -- --        Review of Systems:  Review of Systems  Musculoskeletal:  Negative for gait problem.       Reports dislocated shoulder and has upcoming apt for this to be evaluated  Neurological:  Negative for tremors.  Psychiatric/Behavioral:         Please refer to HPI   Medications: I have reviewed the patient's current medications.  Current Outpatient Medications  Medication Sig Dispense Refill   famotidine (PEPCID) 20 MG tablet TAKE 1 TABLET (20 MG TOTAL) BY MOUTH 2 (TWO) TIMES DAILY.  TO REDUCE STOMACH ACID 180 tablet 0   levETIRAcetam (KEPPRA) 500 MG tablet Take 1 tablet (500 mg total) by mouth 2 (two) times daily. 180 tablet 3   lisinopril-hydrochlorothiazide (ZESTORETIC) 10-12.5 MG tablet 1 daily (Patient taking differently: 1 daily in the morning and 1/2 tablet at night) 90 tablet 0   MAGNESIUM PO Take 500 mg by mouth at bedtime.     pantoprazole (PROTONIX) 40 MG tablet TAKE 1 TABLET  BY MOUTH EVERY DAY 30 tablet 0   temazepam (RESTORIL) 15 MG capsule Take 1-2 capsules po QHS prn insomnia 60 capsule 0   VITAMIN D-VITAMIN K PO Take by mouth.     [START ON 04/11/2021] ALPRAZolam (XANAX) 1 MG tablet Take 1 tablet (1 mg total) by mouth 3 (three) times daily as needed for anxiety or sleep (Panic). 90 tablet 4   levocetirizine (XYZAL) 5 MG tablet TAKE 1 TABLET (5 MG TOTAL) BY MOUTH EVERY EVENING. TO HELP WITH NASAL ALLERGIES (Patient not taking: Reported on 12/19/2020) 90 tablet 1   ondansetron (ZOFRAN-ODT) 4 MG disintegrating tablet Take 1 tablet (4 mg total) by mouth every 8 (eight) hours as needed for nausea or vomiting. (Patient not taking: Reported on 03/12/2021) 20 tablet 0   potassium chloride (KLOR-CON) 10 MEQ tablet Take 10 mEq by mouth 2 (two) times daily. (Patient not taking: Reported on 12/19/2020)     thiamine 100 MG tablet Take 1 tablet (100 mg total) by mouth daily. (Patient not taking: Reported on 03/12/2021) 30 tablet 0   UNABLE TO FIND Serrapeptase (Patient not taking: Reported on 03/12/2021)     No current facility-administered medications for this visit.    Medication Side Effects: None  Allergies:  Allergies  Allergen Reactions   Penicillins Other (See Comments)    Did it involve swelling of the face/tongue/throat, SOB, or low BP? Unknown Did it involve sudden or severe rash/hives, skin peeling, or any reaction on the inside of your mouth or nose? Unknown Did you need to seek medical attention at a hospital or doctor's office? Unknown When did it last happen?      childhood If all above answers are "NO", may proceed with cephalosporin use.     Past Medical History:  Diagnosis Date   Anxiety    social   Hiatal hernia 2003   Hypertension    T8 vertebral fracture (HCC)    Tachycardia    Tumors    on spine    Past Medical History, Surgical history, Social history, and Family history were reviewed and updated as appropriate.   Please see review of  systems for further details on the patient's review from today.   Objective:   Physical Exam:  Wt 215 lb (97.5 kg)   BMI 29.99 kg/m   Physical Exam Constitutional:      General: He is not in acute distress. Musculoskeletal:        General: No deformity.  Neurological:     Mental Status: He is alert and oriented to person, place, and time.     Coordination: Coordination normal.  Psychiatric:        Attention and Perception: Attention and perception normal. He does not perceive auditory or visual hallucinations.        Mood and Affect: Mood is anxious. Affect is not labile, blunt, angry or inappropriate.        Speech: Speech normal.        Behavior: Behavior normal.        Thought  Content: Thought content normal. Thought content is not paranoid or delusional. Thought content does not include homicidal or suicidal ideation. Thought content does not include homicidal or suicidal plan.        Cognition and Memory: Cognition and memory normal.        Judgment: Judgment normal.     Comments: Insight intact Dysphoric mood    Lab Review:     Component Value Date/Time   NA 143 08/07/2020 1103   K 4.4 08/07/2020 1103   CL 100 08/07/2020 1103   CO2 21 08/07/2020 1103   GLUCOSE 94 08/07/2020 1103   GLUCOSE 117 (H) 03/09/2020 0445   BUN 14 08/07/2020 1103   CREATININE 1.01 08/07/2020 1103   CREATININE 1.05 01/14/2016 0838   CALCIUM 10.0 08/07/2020 1103   PROT 7.7 08/07/2020 1103   ALBUMIN 4.8 08/07/2020 1103   AST 16 08/07/2020 1103   ALT 21 08/07/2020 1103   ALKPHOS 121 08/07/2020 1103   BILITOT 0.4 08/07/2020 1103   GFRNONAA 88 08/07/2020 1103   GFRNONAA 86 01/14/2016 0838   GFRAA 101 08/07/2020 1103   GFRAA >89 01/14/2016 0838       Component Value Date/Time   WBC 7.2 08/07/2020 1103   WBC 7.3 03/09/2020 0445   RBC 5.55 08/07/2020 1103   RBC 4.06 (L) 03/09/2020 0445   HGB 15.7 08/07/2020 1103   HCT 46.5 08/07/2020 1103   PLT 280 08/07/2020 1103   MCV 84 08/07/2020  1103   MCH 28.3 08/07/2020 1103   MCH 31.3 03/09/2020 0445   MCHC 33.8 08/07/2020 1103   MCHC 36.4 (H) 03/09/2020 0445   RDW 13.5 08/07/2020 1103   LYMPHSABS 1.9 08/07/2020 1103   MONOABS 0.8 03/09/2020 0445   EOSABS 0.2 08/07/2020 1103   BASOSABS 0.1 08/07/2020 1103    No results found for: POCLITH, LITHIUM   No results found for: PHENYTOIN, PHENOBARB, VALPROATE, CBMZ   .res Assessment: Plan:   Pt seen for 30 minutes and time spent discussing potential benefits, risks, and side effects of possible treatment options for insomnia to include Temazepam and Olanzapine. Discussed that Olanzapine is used off-label for anxiety and insomnia. Pt reports that he prefers to start trial of Temazepam. Pt advised not to take Temazepam within 4 hours of taking Xanax.  Will start Temazepam 15 mg 1-2 capsules at bedtime for insomnia.  Continue Alprazolam 1 mg po TID prn anxiety.  Pt to follow-up in 4-6 months or sooner if clinically indicated.  Patient advised to contact office with any questions, adverse effects, or acute worsening in signs and symptoms.   Kahlan was seen today for insomnia and anxiety.  Diagnoses and all orders for this visit:  Insomnia disorder, with non-sleep disorder mental comorbidity, persistent -     temazepam (RESTORIL) 15 MG capsule; Take 1-2 capsules po QHS prn insomnia -     ALPRAZolam (XANAX) 1 MG tablet; Take 1 tablet (1 mg total) by mouth 3 (three) times daily as needed for anxiety or sleep (Panic).  Panic disorder -     ALPRAZolam (XANAX) 1 MG tablet; Take 1 tablet (1 mg total) by mouth 3 (three) times daily as needed for anxiety or sleep (Panic).  Social anxiety disorder -     ALPRAZolam (XANAX) 1 MG tablet; Take 1 tablet (1 mg total) by mouth 3 (three) times daily as needed for anxiety or sleep (Panic).    Please see After Visit Summary for patient specific instructions.  Future Appointments  Date Time  Provider [facility]  08/12/2021  9:30 AM  Thayer Headings, PMHNP CP-CP None    No orders of the defined types were placed in this encounter.   -------------------------------

## 2021-03-17 ENCOUNTER — Ambulatory Visit: Payer: 59 | Admitting: Psychiatry

## 2021-04-03 ENCOUNTER — Other Ambulatory Visit: Payer: Self-pay | Admitting: Family Medicine

## 2021-04-04 NOTE — Telephone Encounter (Signed)
Requested medications are due for refill today yes  Requested medications are on the active medication list yes  Last refill 03/10/21  Last visit 12/26/20  Future visit scheduled no  Notes to clinic Has already had a curtesy refill and there is no upcoming appointment scheduled. No PCP assigned.

## 2021-04-24 ENCOUNTER — Other Ambulatory Visit: Payer: Self-pay | Admitting: Family Medicine

## 2021-04-24 DIAGNOSIS — K219 Gastro-esophageal reflux disease without esophagitis: Secondary | ICD-10-CM

## 2021-04-24 NOTE — Telephone Encounter (Signed)
Requested Prescriptions  Pending Prescriptions Disp Refills  . famotidine (PEPCID) 20 MG tablet [Pharmacy Med Name: FAMOTIDINE 20 MG TABLET] 180 tablet 0    Sig: TAKE 1 TABLET (20 MG TOTAL) BY MOUTH 2 (TWO) TIMES DAILY. TO REDUCE STOMACH ACID     Gastroenterology:  H2 Antagonists Passed - 04/24/2021  8:31 AM      Passed - Valid encounter within last 12 months    Recent Outpatient Visits          3 months ago Essential hypertension   [place], [place], [place]   8 months ago Essential hypertension   [place], Dionne Bucy, [place]   1 year ago Hospital discharge follow-up   [facility] Blackbird, MD   1 year ago Essential hypertension   [place], RPH-CPP   1 year ago Seizure-like activity [facility])   [facility] Antony Blackbird, MD

## 2021-04-30 ENCOUNTER — Other Ambulatory Visit: Payer: Self-pay | Admitting: Family Medicine

## 2021-04-30 NOTE — Telephone Encounter (Signed)
Requested medications are due for refill today.  yes  Requested medications are on the active medications list.  yes  Last refill. 04/04/2021  Future visit scheduled.   no  Notes to clinic.  No PCP listed.

## 2021-05-01 ENCOUNTER — Other Ambulatory Visit: Payer: Self-pay | Admitting: Surgery

## 2021-05-02 ENCOUNTER — Encounter
Admission: RE | Admit: 2021-05-02 | Discharge: 2021-05-02 | Disposition: A | Discharge status: Home / Self Care | Payer: 59 | Source: Ambulatory Visit | Attending: Surgery | Admitting: Surgery

## 2021-05-02 ENCOUNTER — Other Ambulatory Visit: Payer: Self-pay

## 2021-05-02 HISTORY — DX: Gastro-esophageal reflux disease without esophagitis: K21.9

## 2021-05-02 NOTE — Progress Notes (Signed)
Perioperative Services Pre-Admission/Anesthesia Testing   Date: 05/02/21 Name: Donald Jenkins MRN:   [id number]  Re: Consideration of preoperative prophylactic antibiotic change   Request sent to: Poggi, Marshall Cork, MD (routed and/or faxed via [facility])  Planned Surgical Procedure(s):    Case: [id number] Date/Time: 05/06/21 1250   Procedure: open reduction internal fixation of type V AC separation of the right shoulder (Right: Shoulder)   Anesthesia type: Choice   Pre-op diagnosis: Separation of right acromioclavicular joint, subsequent encounter S43.101D   Location: [place] 03 / [facility]   Surgeons: Corky Mull, MD   Clinical Notes:  Patient has a documented allergy to PCN  The actual reaction to PCN is unknown as it occurred during his childhood years.  Screened as appropriate for cephalosporin use during medication reconciliation No immediate angioedema, dysphagia, SOB, anaphylaxis symptoms. No severe rash involving mucous membranes or skin necrosis. No hospital admissions related to side effects of PCN/cephalosporin use.  No documented reaction to PCN or cephalosporin in the last 10 years.  Request:  As an evidence based approach to reducing the rate of incidence for post-operative SSI and the development of MDROs, could an agent with narrower coverage for preoperative prophylaxis in this patient's upcoming surgical course be considered?   Currently ordered preoperative prophylactic ABX: clindamycin.   Specifically requesting change to cephalosporin (CEFAZOLIN).   Please communicate decision with me and I will change the orders in Epic as per your direction.   Things to consider: Many patients report that they were "allergic" to PCN earlier in life, however this does not translate into a true lifelong allergy. Patients can lose sensitivity to specific IgE antibodies over time if PCN is avoided (Kleris & Lugar, 2019).  Up to 10% of the adult population  and 15% of hospitalized patients report an allergy to PCN, however clinical studies suggest that 90% of those reporting an allergy can tolerate PCN antibiotics (Kleris & Lugar, 2019).  Cross-sensitivity between PCN and cephalosporins has been documented as being as high as 10%, however this estimation included data believed to have been collected in a setting where there was contamination. Newer data suggests that the prevalence of cross-sensitivity between PCN and cephalosporins is actually estimated to be closer to 1% (Hermanides et al., 2018).   Patients labeled as PCN allergic, whether they are truly allergic or not, have been found to have inferior outcomes in terms of rates of serious infection, and these patients tend to have longer hospital stays ([place], 2019).  Treatment related secondary infections, such as Clostridioides difficile, have been linked to the improper use of broad spectrum antibiotics in patients improperly labeled as PCN allergic (Kleris & Lugar, 2019).  Anaphylaxis from cephalosporins is rare and the evidence suggests that there is no increased risk of an anaphylactic type reaction when cephalosporins are used in a PCN allergic patient (Pichichero, 2006).  Citations: Hermanides J, Lemkes BA, Prins Pearla Dubonnet MW, Terreehorst I. Presumed ?-Lactam Allergy and Cross-reactivity in the Operating Theater: A Practical Approach. Anesthesiology. 2018 Aug;129(2):335-342. doi: 10.1097/ALN.0000000000002252. PMID: 29518841.  Kleris, [place]., & Lugar, P. L. (2019). Things We Do For No Reason: Failing to Question a Penicillin Allergy History. Journal of hospital medicine, 14(10), [phone]. Advance online publication. https://www.wallace-middleton.info/  Pichichero, M. E. (2006). Cephalosporins can be prescribed safely for penicillin-allergic patients. Journal of family medicine, 55(2), 106-112. Accessed:  https://cdn.mdedge.com/files/s12fs-public/Document/September-2017/5502JFP_AppliedEvidence1.pdf   Honor Loh, MSN, APRN, FNP-C, CEN [facility]  Peri-operative Services Nurse Practitioner  FAX: [phone] 05/02/21 11:57 AM

## 2021-05-02 NOTE — Patient Instructions (Addendum)
Your procedure is scheduled on:05-06-21 Tuesday Report to the Registration Desk on the 1st floor of the [facility].Then proceed to the 2nd floor Surgery Desk in the [facility] To find out your arrival time, please call [phone] between 1PM - 3PM on:05-05-21 Monday  REMEMBER: Instructions that are not followed completely may result in serious medical risk, up to and including death; or upon the discretion of your surgeon and anesthesiologist your surgery may need to be rescheduled.  Do not eat food after midnight the night before surgery.  No gum chewing, lozengers or hard candies.  You may however, drink CLEAR liquids up to 2 hours before you are scheduled to arrive for your surgery. Do not drink anything within 2 hours of your scheduled arrival time.  Clear liquids include: - water  - apple juice without pulp - gatorade (not RED, PURPLE, OR BLUE) - black coffee or tea (Do NOT add milk or creamers to the coffee or tea) Do NOT drink anything that is not on this list.  In addition, your doctor has ordered for you to drink the provided  Ensure Pre-Surgery Clear Carbohydrate Drink  Drinking this carbohydrate drink up to two hours before surgery helps to reduce insulin resistance and improve patient outcomes. Please complete drinking 2 hours prior to scheduled arrival time.  TAKE THESE MEDICATIONS THE MORNING OF SURGERY WITH A SIP OF WATER: -amLODipine (NORVASC) 5 MG tablet -levETIRAcetam (KEPPRA) 500 MG tablet -famotidine (PEPCID) 20 MG tablet-take one the night before and one on the morning of surgery - helps to prevent nausea after surgery.) -You may take ALPRAZolam (XANAX) 1 MG tablet the day of surgery if needed  One week prior to surgery: Stop Anti-inflammatories (NSAIDS) such as Advil, Aleve, Ibuprofen, Motrin, Naproxen, Naprosyn and Aspirin based products such as Excedrin, Goodys Powder, BC Powder.You may however, continue to take Tylenol if needed for pain up until the day  of surgery.  Stop ANY OVER THE COUNTER supplements/vitamins NOW (05-02-21) until after surgery (Omega 3 1000 MG CAPS, Serrapeptase (Serratiopeptidas), VITAMIN D-VITAMIN K PO)  No Alcohol for 24 hours before or after surgery.  No Smoking including e-cigarettes for 24 hours prior to surgery.  No chewable tobacco products for at least 6 hours prior to surgery.  No nicotine patches on the day of surgery.  Do not use any "recreational" drugs for at least a week prior to your surgery.  Please be advised that the combination of cocaine and anesthesia may have negative outcomes, up to and including death. If you test positive for cocaine, your surgery will be cancelled.  On the morning of surgery brush your teeth with toothpaste and water, you may rinse your mouth with mouthwash if you wish. Do not swallow any toothpaste or mouthwash.  Use CHG Soap as directed on instruction sheet.  Do not wear jewelry, make-up, hairpins, clips or nail polish.  Do not wear lotions, powders, or perfumes.   Do not shave body from the neck down 48 hours prior to surgery just in case you cut yourself which could leave a site for infection.  Also, freshly shaved skin may become irritated if using the CHG soap.  Contact lenses, hearing aids and dentures may not be worn into surgery.  Do not bring valuables to the hospital. [facility] is not responsible for any missing/lost belongings or valuables.   Notify your doctor if there is any change in your medical condition (cold, fever, infection).  Wear comfortable clothing (specific to your surgery  type) to the hospital.  After surgery, you can help prevent lung complications by doing breathing exercises.  Take deep breaths and cough every 1-2 hours. Your doctor may order a device called an Incentive Spirometer to help you take deep breaths. When coughing or sneezing, hold a pillow firmly against your incision with both hands. This is called "splinting." Doing this  helps protect your incision. It also decreases belly discomfort.  If you are being admitted to the hospital overnight, leave your suitcase in the car. After surgery it may be brought to your room.  If you are being discharged the day of surgery, you will not be allowed to drive home. You will need a responsible adult (18 years or older) to drive you home and stay with you that night.   If you are taking public transportation, you will need to have a responsible adult (18 years or older) with you. Please confirm with your physician that it is acceptable to use public transportation.   Please call the [facility] Dept. at [phone] if you have any questions about these instructions.  Surgery Visitation Policy:  Patients undergoing a surgery or procedure may have one family member or support person with them as long as that person is not COVID-19 positive or experiencing its symptoms.  That person may remain in the waiting area during the procedure and may rotate out with other people.  Inpatient Visitation:    Visiting hours are 7 a.m. to 8 p.m. Up to two visitors ages 16+ are allowed at one time in a patient room. The visitors may rotate out with other people during the day. Visitors must check out when they leave, or other visitors will not be allowed. One designated support person may remain overnight. The visitor must pass COVID-19 screenings, use hand sanitizer when entering and exiting the patient's room and wear a mask at all times, including in the patient's room. Patients must also wear a mask when staff or their visitor are in the room. Masking is required regardless of vaccination status.

## 2021-05-05 ENCOUNTER — Encounter
Admission: RE | Admit: 2021-05-05 | Discharge: 2021-05-05 | Disposition: A | Discharge status: Home / Self Care | Payer: 59 | Source: Ambulatory Visit | Attending: Surgery | Admitting: Surgery

## 2021-05-05 ENCOUNTER — Other Ambulatory Visit: Payer: Self-pay

## 2021-05-05 ENCOUNTER — Other Ambulatory Visit: Payer: Self-pay | Admitting: Psychiatry

## 2021-05-05 DIAGNOSIS — F5105 Insomnia due to other mental disorder: Secondary | ICD-10-CM

## 2021-05-05 DIAGNOSIS — I10 Essential (primary) hypertension: Secondary | ICD-10-CM | POA: Insufficient documentation

## 2021-05-05 DIAGNOSIS — Z01810 Encounter for preprocedural cardiovascular examination: Secondary | ICD-10-CM | POA: Diagnosis present

## 2021-05-05 NOTE — Progress Notes (Signed)
  Perioperative Services Pre-Admission/Anesthesia Testing     Date: 05/05/21  Name: Donald Jenkins MRN:   [id number]  Re: Change in [place] for upcoming surgery   Case: [id number] Date/Time: 05/06/21 1250   Procedure: open reduction internal fixation of type V AC separation of the right shoulder (Right: Shoulder)   Anesthesia type: Choice   Pre-op diagnosis: Separation of right acromioclavicular joint, subsequent encounter S43.101D   Location: [place] 03 / [place]   Surgeons: Corky Mull, MD   Primary attending surgeon was consulted regarding consideration of therapeutic change in antimicrobial agent being used for preoperative prophylaxis in this patient's upcoming surgical case. Following analysis of the risk versus benefits, the patient's primary attending surgeon advised that it would be acceptable to discontinue the ordered clindamycin and place an order for cefazolin 2 gm IV on call to the OR. Orders for this patient were amended by me following collaborative conversation with attending surgeon taking into consideration of risk versus benefits associated with the change in therapy.  Honor Loh, MSN, APRN, FNP-C, CEN [facility]  Peri-operative Services Nurse Practitioner Phone: [phone] 05/05/21 8:15 AM

## 2021-05-06 ENCOUNTER — Encounter: Payer: Self-pay | Admitting: Surgery

## 2021-05-06 ENCOUNTER — Ambulatory Visit: Payer: 59

## 2021-05-06 ENCOUNTER — Ambulatory Visit: Payer: 59 | Admitting: Urgent Care

## 2021-05-06 ENCOUNTER — Encounter
Admission: RE | Disposition: A | Discharge status: Home / Self Care | Payer: Self-pay | Source: Home / Self Care | Attending: Surgery

## 2021-05-06 ENCOUNTER — Ambulatory Visit
Admission: RE | Admit: 2021-05-06 | Discharge: 2021-05-06 | Disposition: A | Discharge status: Home / Self Care | Payer: 59 | Attending: Surgery | Admitting: Surgery

## 2021-05-06 ENCOUNTER — Other Ambulatory Visit: Payer: Self-pay

## 2021-05-06 ENCOUNTER — Other Ambulatory Visit: Payer: Self-pay | Admitting: Family Medicine

## 2021-05-06 DIAGNOSIS — M25511 Pain in right shoulder: Secondary | ICD-10-CM

## 2021-05-06 DIAGNOSIS — Z419 Encounter for procedure for purposes other than remedying health state, unspecified: Secondary | ICD-10-CM

## 2021-05-06 DIAGNOSIS — G40909 Epilepsy, unspecified, not intractable, without status epilepticus: Secondary | ICD-10-CM

## 2021-05-06 DIAGNOSIS — E669 Obesity, unspecified: Secondary | ICD-10-CM | POA: Diagnosis not present

## 2021-05-06 DIAGNOSIS — Z6830 Body mass index (BMI) 30.0-30.9, adult: Secondary | ICD-10-CM | POA: Insufficient documentation

## 2021-05-06 DIAGNOSIS — I10 Essential (primary) hypertension: Secondary | ICD-10-CM | POA: Diagnosis not present

## 2021-05-06 DIAGNOSIS — Y9389 Activity, other specified: Secondary | ICD-10-CM | POA: Diagnosis not present

## 2021-05-06 DIAGNOSIS — S43131A Dislocation of right acromioclavicular joint, greater than 200% displacement, initial encounter: Secondary | ICD-10-CM | POA: Diagnosis present

## 2021-05-06 DIAGNOSIS — Z79899 Other long term (current) drug therapy: Secondary | ICD-10-CM | POA: Insufficient documentation

## 2021-05-06 HISTORY — PX: ACROMIO-CLAVICULAR JOINT REPAIR: SHX5183

## 2021-05-06 SURGERY — REPAIR, ACROMIOCLAVICULAR JOINT
Anesthesia: General | Site: Shoulder | Laterality: Right

## 2021-05-06 MED ORDER — MIDAZOLAM HCL 2 MG/2ML IJ SOLN
INTRAMUSCULAR | Status: AC
  Administered 2021-05-06: 1 mg via INTRAVENOUS
  Filled 2021-05-06: qty 2

## 2021-05-06 MED ORDER — LACTATED RINGERS IV SOLN: INTRAVENOUS | Status: DC

## 2021-05-06 MED ORDER — METOCLOPRAMIDE HCL 10 MG PO TABS: 5.0000 mg | ORAL_TABLET | Freq: Three times a day (TID) | ORAL | Status: DC | PRN

## 2021-05-06 MED ORDER — DEXMEDETOMIDINE (PRECEDEX) IN NS 20 MCG/5ML (4 MCG/ML) IV SYRINGE
PREFILLED_SYRINGE | INTRAVENOUS | Status: DC | PRN
  Administered 2021-05-06: 12 ug via INTRAVENOUS
  Administered 2021-05-06 (×2): 8 ug via INTRAVENOUS

## 2021-05-06 MED ORDER — MIDAZOLAM HCL 2 MG/2ML IJ SOLN
INTRAMUSCULAR | Status: AC
  Filled 2021-05-06: qty 2

## 2021-05-06 MED ORDER — FENTANYL CITRATE PF 50 MCG/ML IJ SOSY
PREFILLED_SYRINGE | INTRAMUSCULAR | Status: AC
  Administered 2021-05-06: 50 ug via INTRAVENOUS
  Filled 2021-05-06: qty 1

## 2021-05-06 MED ORDER — BUPIVACAINE HCL (PF) 0.5 % IJ SOLN
INTRAMUSCULAR | Status: AC
  Filled 2021-05-06: qty 10

## 2021-05-06 MED ORDER — BUPIVACAINE LIPOSOME 1.3 % IJ SUSP
INTRAMUSCULAR | Status: AC
  Filled 2021-05-06: qty 10

## 2021-05-06 MED ORDER — CHLORHEXIDINE GLUCONATE 0.12 % MT SOLN
OROMUCOSAL | Status: AC
  Administered 2021-05-06: 15 mL via OROMUCOSAL
  Filled 2021-05-06: qty 15

## 2021-05-06 MED ORDER — 0.9 % SODIUM CHLORIDE (POUR BTL) OPTIME
TOPICAL | Status: DC | PRN
  Administered 2021-05-06: 500 mL

## 2021-05-06 MED ORDER — CEFAZOLIN SODIUM-DEXTROSE 2-4 GM/100ML-% IV SOLN
INTRAVENOUS | Status: AC
  Filled 2021-05-06: qty 100

## 2021-05-06 MED ORDER — BUPIVACAINE-EPINEPHRINE (PF) 0.5% -1:200000 IJ SOLN
INTRAMUSCULAR | Status: DC | PRN
  Administered 2021-05-06: 15 mL

## 2021-05-06 MED ORDER — METOCLOPRAMIDE HCL 5 MG/ML IJ SOLN: 5.0000 mg | Freq: Three times a day (TID) | INTRAMUSCULAR | Status: DC | PRN

## 2021-05-06 MED ORDER — PHENYLEPHRINE HCL-NACL 20-0.9 MG/250ML-% IV SOLN
INTRAVENOUS | Status: DC | PRN
  Administered 2021-05-06: 50 ug/min via INTRAVENOUS

## 2021-05-06 MED ORDER — ONDANSETRON HCL 4 MG/2ML IJ SOLN: 4.0000 mg | Freq: Four times a day (QID) | INTRAMUSCULAR | Status: DC | PRN

## 2021-05-06 MED ORDER — MIDAZOLAM HCL 2 MG/2ML IJ SOLN: 1.0000 mg | Freq: Once | INTRAMUSCULAR | Status: AC

## 2021-05-06 MED ORDER — BUPIVACAINE LIPOSOME 1.3 % IJ SUSP
INTRAMUSCULAR | Status: DC | PRN
  Administered 2021-05-06: 20 mL via PERINEURAL

## 2021-05-06 MED ORDER — KETOROLAC TROMETHAMINE 30 MG/ML IJ SOLN
30.0000 mg | Freq: Once | INTRAMUSCULAR | Status: AC
  Administered 2021-05-06: 30 mg via INTRAVENOUS

## 2021-05-06 MED ORDER — ONDANSETRON HCL 4 MG/2ML IJ SOLN
INTRAMUSCULAR | Status: DC | PRN
  Administered 2021-05-06: 4 mg via INTRAVENOUS

## 2021-05-06 MED ORDER — SUGAMMADEX SODIUM 200 MG/2ML IV SOLN
INTRAVENOUS | Status: DC | PRN
  Administered 2021-05-06: 200 mg via INTRAVENOUS

## 2021-05-06 MED ORDER — CEFAZOLIN SODIUM-DEXTROSE 2-4 GM/100ML-% IV SOLN
2.0000 g | Freq: Once | INTRAVENOUS | Status: AC
  Administered 2021-05-06: 2 g via INTRAVENOUS

## 2021-05-06 MED ORDER — CHLORHEXIDINE GLUCONATE 0.12 % MT SOLN: 15.0000 mL | Freq: Once | OROMUCOSAL | Status: AC

## 2021-05-06 MED ORDER — BUPIVACAINE HCL (PF) 0.5 % IJ SOLN
INTRAMUSCULAR | Status: DC | PRN
  Administered 2021-05-06: 10 mL via PERINEURAL

## 2021-05-06 MED ORDER — DEXAMETHASONE SODIUM PHOSPHATE 10 MG/ML IJ SOLN
INTRAMUSCULAR | Status: DC | PRN
  Administered 2021-05-06: 10 mg via INTRAVENOUS

## 2021-05-06 MED ORDER — PROPOFOL 10 MG/ML IV BOLUS
INTRAVENOUS | Status: DC | PRN
  Administered 2021-05-06: 20 mg via INTRAVENOUS
  Administered 2021-05-06: 200 mg via INTRAVENOUS
  Administered 2021-05-06: 60 mg via INTRAVENOUS
  Administered 2021-05-06: 80 mg via INTRAVENOUS

## 2021-05-06 MED ORDER — OXYCODONE HCL 5 MG PO TABS
ORAL_TABLET | ORAL | Status: AC
  Filled 2021-05-06: qty 2

## 2021-05-06 MED ORDER — MIDAZOLAM HCL 2 MG/2ML IJ SOLN
INTRAMUSCULAR | Status: DC | PRN
  Administered 2021-05-06: 2 mg via INTRAVENOUS

## 2021-05-06 MED ORDER — LIDOCAINE HCL (CARDIAC) PF 100 MG/5ML IV SOSY
PREFILLED_SYRINGE | INTRAVENOUS | Status: DC | PRN
  Administered 2021-05-06: 80 mg via INTRAVENOUS

## 2021-05-06 MED ORDER — MIDAZOLAM HCL 2 MG/2ML IJ SOLN: INTRAMUSCULAR | Status: DC | PRN

## 2021-05-06 MED ORDER — EPHEDRINE SULFATE 50 MG/ML IJ SOLN
INTRAMUSCULAR | Status: DC | PRN
  Administered 2021-05-06: 5 mg via INTRAVENOUS
  Administered 2021-05-06: 10 mg via INTRAVENOUS

## 2021-05-06 MED ORDER — ROCURONIUM BROMIDE 100 MG/10ML IV SOLN
INTRAVENOUS | Status: DC | PRN
  Administered 2021-05-06 (×2): 20 mg via INTRAVENOUS
  Administered 2021-05-06: 50 mg via INTRAVENOUS

## 2021-05-06 MED ORDER — ONDANSETRON HCL 4 MG PO TABS: 4.0000 mg | ORAL_TABLET | Freq: Four times a day (QID) | ORAL | Status: DC | PRN

## 2021-05-06 MED ORDER — LISINOPRIL-HYDROCHLOROTHIAZIDE 10-12.5 MG PO TABS: ORAL_TABLET | ORAL | 0 refills | Status: DC

## 2021-05-06 MED ORDER — LEVETIRACETAM 500 MG PO TABS: 500.0000 mg | ORAL_TABLET | Freq: Every morning | ORAL | Status: DC

## 2021-05-06 MED ORDER — FENTANYL CITRATE (PF) 100 MCG/2ML IJ SOLN: 25.0000 ug | INTRAMUSCULAR | Status: DC | PRN

## 2021-05-06 MED ORDER — BUPIVACAINE-EPINEPHRINE (PF) 0.5% -1:200000 IJ SOLN
INTRAMUSCULAR | Status: AC
  Filled 2021-05-06: qty 30

## 2021-05-06 MED ORDER — ORAL CARE MOUTH RINSE: 15.0000 mL | Freq: Once | OROMUCOSAL | Status: AC

## 2021-05-06 MED ORDER — SUCCINYLCHOLINE CHLORIDE 200 MG/10ML IV SOSY
PREFILLED_SYRINGE | INTRAVENOUS | Status: DC | PRN
  Administered 2021-05-06: 100 mg via INTRAVENOUS

## 2021-05-06 MED ORDER — ONDANSETRON HCL 4 MG/2ML IJ SOLN: 4.0000 mg | Freq: Once | INTRAMUSCULAR | Status: DC | PRN

## 2021-05-06 MED ORDER — FENTANYL CITRATE PF 50 MCG/ML IJ SOSY: 50.0000 ug | PREFILLED_SYRINGE | Freq: Once | INTRAMUSCULAR | Status: AC

## 2021-05-06 MED ORDER — LIDOCAINE HCL (PF) 1 % IJ SOLN
INTRAMUSCULAR | Status: DC | PRN
  Administered 2021-05-06: 3 mL

## 2021-05-06 MED ORDER — OXYCODONE HCL 5 MG PO TABS
5.0000 mg | ORAL_TABLET | ORAL | Status: DC | PRN
  Administered 2021-05-06: 10 mg via ORAL

## 2021-05-06 MED ORDER — LIDOCAINE HCL (PF) 1 % IJ SOLN
INTRAMUSCULAR | Status: AC
  Filled 2021-05-06: qty 5

## 2021-05-06 MED ORDER — OXYCODONE HCL 5 MG PO TABS: 5.0000 mg | ORAL_TABLET | ORAL | 0 refills | Status: DC | PRN

## 2021-05-06 MED ORDER — PHENYLEPHRINE HCL (PRESSORS) 10 MG/ML IV SOLN
INTRAVENOUS | Status: DC | PRN
  Administered 2021-05-06 (×4): 100 ug via INTRAVENOUS

## 2021-05-06 MED ORDER — KETOROLAC TROMETHAMINE 30 MG/ML IJ SOLN
INTRAMUSCULAR | Status: AC
  Filled 2021-05-06: qty 1

## 2021-05-06 MED ORDER — PROPOFOL 10 MG/ML IV BOLUS
INTRAVENOUS | Status: AC
  Filled 2021-05-06: qty 20

## 2021-05-06 SURGICAL SUPPLY — 68 items
APL PRP STRL LF DISP 70% ISPRP (MISCELLANEOUS) ×2
APL SKNCLS STERI-STRIP NONHPOA (GAUZE/BANDAGES/DRESSINGS) ×1
BENZOIN TINCTURE PRP APPL 2/3 (GAUZE/BANDAGES/DRESSINGS) ×1 IMPLANT
BIT DRILL 2.8 (BIT) ×1
BIT DRILL CANN QC 2.8X165 (BIT) IMPLANT
BLADE MED AGGRESSIVE (BLADE) ×1 IMPLANT
BNDG COHESIVE 4X5 TAN ST LF (GAUZE/BANDAGES/DRESSINGS) ×2 IMPLANT
BNDG ELASTIC 4X5.8 VLCR STR LF (GAUZE/BANDAGES/DRESSINGS) ×4 IMPLANT
CHLORAPREP W/TINT 26 (MISCELLANEOUS) ×4 IMPLANT
DRAPE C-ARM XRAY 36X54 (DRAPES) ×2 IMPLANT
DRAPE IMP U-DRAPE 54X76 (DRAPES) ×4 IMPLANT
DRAPE INCISE IOBAN 66X45 STRL (DRAPES) ×4 IMPLANT
DRAPE SHEET LG 3/4 BI-LAMINATE (DRAPES) ×2 IMPLANT
DRAPE U-SHAPE 47X51 STRL (DRAPES) ×2 IMPLANT
DRILL BIT 2.8MM (BIT) ×2
DRSG OPSITE POSTOP 4X10 (GAUZE/BANDAGES/DRESSINGS) ×2 IMPLANT
DRSG OPSITE POSTOP 4X6 (GAUZE/BANDAGES/DRESSINGS) ×1 IMPLANT
DRSG OPSITE POSTOP 4X8 (GAUZE/BANDAGES/DRESSINGS) ×2 IMPLANT
ELECT CAUTERY BLADE 6.4 (BLADE) ×2 IMPLANT
ELECT REM PT RETURN 9FT ADLT (ELECTROSURGICAL) ×2
ELECTRODE REM PT RTRN 9FT ADLT (ELECTROSURGICAL) ×1 IMPLANT
GAUZE SPONGE 4X4 12PLY STRL (GAUZE/BANDAGES/DRESSINGS) ×2 IMPLANT
GAUZE XEROFORM 1X8 LF (GAUZE/BANDAGES/DRESSINGS) ×2 IMPLANT
GLOVE SRG 8 PF TXTR STRL LF DI (GLOVE) ×1 IMPLANT
GLOVE SURG ENC MOIS LTX SZ7.5 (GLOVE) ×4 IMPLANT
GLOVE SURG ENC MOIS LTX SZ8 (GLOVE) ×4 IMPLANT
GLOVE SURG UNDER LTX SZ8 (GLOVE) ×2 IMPLANT
GLOVE SURG UNDER POLY LF SZ8 (GLOVE) ×2
GOWN STRL REUS W/ TWL LRG LVL3 (GOWN DISPOSABLE) ×2 IMPLANT
GOWN STRL REUS W/ TWL XL LVL3 (GOWN DISPOSABLE) ×1 IMPLANT
GOWN STRL REUS W/TWL LRG LVL3 (GOWN DISPOSABLE) ×4
GOWN STRL REUS W/TWL XL LVL3 (GOWN DISPOSABLE) ×2
HANDLE YANKAUER SUCT BULB TIP (MISCELLANEOUS) ×2 IMPLANT
IMMOBILIZER SHDR LG LX 900803 (SOFTGOODS) ×1 IMPLANT
KIT STABILIZATION SHOULDER (MISCELLANEOUS) ×1 IMPLANT
KIT TURNOVER KIT A (KITS) ×2 IMPLANT
MANIFOLD NEPTUNE II (INSTRUMENTS) ×2 IMPLANT
MASK FACE SPIDER DISP (MASK) ×2 IMPLANT
MAT ABSORB  FLUID 56X50 GRAY (MISCELLANEOUS) ×2
MAT ABSORB FLUID 56X50 GRAY (MISCELLANEOUS) ×1 IMPLANT
NDL HYPO 25X1 1.5 SAFETY (NEEDLE) ×1 IMPLANT
NDL MAYO 6 CRC TAPER PT (NEEDLE) ×3 IMPLANT
NEEDLE HYPO 25X1 1.5 SAFETY (NEEDLE) ×2 IMPLANT
NEEDLE MAYO 6 CRC TAPER PT (NEEDLE) ×6 IMPLANT
NS IRRIG 1000ML POUR BTL (IV SOLUTION) ×2 IMPLANT
PACK ARTHROSCOPY SHOULDER (MISCELLANEOUS) ×2 IMPLANT
PAD CAST CTTN 4X4 STRL (SOFTGOODS) ×2 IMPLANT
PADDING CAST COTTON 4X4 STRL (SOFTGOODS) ×4
PLATE CLAVICLE HOOK R 7H (Plate) ×1 IMPLANT
PULSAVAC PLUS IRRIG FAN TIP (DISPOSABLE)
SCREW HEXALOBE NON-LOCK 3.5X14 (Screw) ×3 IMPLANT
SCREW HEXALOBE NON-LOCK 3.5X16 (Screw) ×1 IMPLANT
SLING ULTRA II LG (MISCELLANEOUS) ×2 IMPLANT
SPONGE T-LAP 18X18 ~~LOC~~+RFID (SPONGE) ×2 IMPLANT
STAPLER SKIN PROX 35W (STAPLE) ×2 IMPLANT
STOCKINETTE IMPERVIOUS 9X36 MD (GAUZE/BANDAGES/DRESSINGS) ×2 IMPLANT
STRIP CLOSURE SKIN 1/4X4 (GAUZE/BANDAGES/DRESSINGS) ×1 IMPLANT
SUT ETHIBOND 0 MO6 C/R (SUTURE) ×1 IMPLANT
SUT FIBERWIRE #2 38 BLUE 1/2 (SUTURE)
SUT PROLENE 4 0 PS 2 18 (SUTURE) ×2 IMPLANT
SUT TIGER TAPE 7 IN WHITE (SUTURE) ×2 IMPLANT
SUT VIC AB 0 CT1 36 (SUTURE) ×1 IMPLANT
SUT VIC AB 2-0 CT1 27 (SUTURE) ×4
SUT VIC AB 2-0 CT1 TAPERPNT 27 (SUTURE) ×4 IMPLANT
SUTURE FIBERWR #2 38 BLUE 1/2 (SUTURE) ×4 IMPLANT
SYR 10ML LL (SYRINGE) ×2 IMPLANT
TIP FAN IRRIG PULSAVAC PLUS (DISPOSABLE) ×1 IMPLANT
WATER STERILE IRR 500ML POUR (IV SOLUTION) ×2 IMPLANT

## 2021-05-06 NOTE — Discharge Instructions (Addendum)
Orthopedic discharge instructions: May shower with intact OpSite dressing once block has worn off. Apply ice frequently to shoulder. Take ibuprofen 600-800 mg TID with meals for 7-10 days, then as necessary. Take oxycodone as prescribed when needed.  May supplement with ES Tylenol if necessary. Keep shoulder immobilizer on at all times except may remove for bathing purposes. Follow-up in 10-14 days or as scheduled.  AMBULATORY SURGERY  DISCHARGE INSTRUCTIONS   The drugs that you were given will stay in your system until tomorrow so for the next 24 hours you should not:  Drive an automobile Make any legal decisions Drink any alcoholic beverage   You may resume regular meals tomorrow.  Today it is better to start with liquids and gradually work up to solid foods.  You may eat anything you prefer, but it is better to start with liquids, then soup and crackers, and gradually work up to solid foods.   Please notify your doctor immediately if you have any unusual bleeding, trouble breathing, redness and pain at the surgery site, drainage, fever, or pain not relieved by medication.    Additional Instructions:  DO NOT REMOVE TEAL EXPAREL BRACELET FOR 4 DAYS (96 hours)   nformation for Discharge Teaching: EXPAREL (bupivacaine liposome injectable suspension)   Your surgeon or anesthesiologist gave you EXPAREL(bupivacaine) to help control your pain after surgery.  EXPAREL is a local anesthetic that provides pain relief by numbing the tissue around the surgical site. EXPAREL is designed to release pain medication over time and can control pain for up to 72 hours. Depending on how you respond to EXPAREL, you may require less pain medication during your recovery.  Possible side effects: Temporary loss of sensation or ability to move in the area where bupivacaine was injected. Nausea, vomiting, constipation Rarely, numbness and tingling in your mouth or lips, lightheadedness, or anxiety  may occur. Call your doctor right away if you think you may be experiencing any of these sensations, or if you have other questions regarding possible side effects.  Follow all other discharge instructions given to you by your surgeon or nurse. Eat a healthy diet and drink plenty of water or other fluids.  If you return to the hospital for any reason within 96 hours following the administration of EXPAREL, it is important for health care providers to know that you have received this anesthetic. A teal colored band has been placed on your arm with the date, time and amount of EXPAREL you have received in order to alert and inform your health care providers. Please leave this armband in place for the full 96 hours following administration, and then you may remove the band.         Please contact your physician with any problems or Same Day Surgery at [phone], Monday through Friday 6 am to 4 pm, or [place] [facility] number at [phone]. AMBULATORY SURGERY  DISCHARGE INSTRUCTIONS   The drugs that you were given will stay in your system until tomorrow so for the next 24 hours you should not:  Drive an automobile Make any legal decisions Drink any alcoholic beverage   You may resume regular meals tomorrow.  Today it is better to start with liquids and gradually work up to solid foods.  You may eat anything you prefer, but it is better to start with liquids, then soup and crackers, and gradually work up to solid foods.   Please notify your doctor immediately if you have any unusual bleeding, trouble  breathing, redness and pain at the surgery site, drainage, fever, or pain not relieved by medication.

## 2021-05-06 NOTE — Anesthesia Postprocedure Evaluation (Signed)
Anesthesia Post Note  Patient: CASTIEL LAURICELLA  Procedure(s) Performed: open reduction internal fixation of type V AC separation of the right shoulder (Right: Shoulder)  Patient location during evaluation: PACU Anesthesia Type: General Level of consciousness: awake and alert and oriented Pain management: pain level controlled Vital Signs Assessment: post-procedure vital signs reviewed and stable Respiratory status: spontaneous breathing, nonlabored ventilation and respiratory function stable Cardiovascular status: blood pressure returned to baseline and stable Postop Assessment: no signs of nausea or vomiting Anesthetic complications: no   No notable events documented.   Last Vitals:  Vitals:   05/06/21 1330 05/06/21 1353  BP: 109/73 112/76  Pulse: 80 88  Resp: 17 18  Temp: (!) 36.3 C 36.4 C  SpO2: 93% 94%    Last Pain:  Vitals:   05/06/21 1353  TempSrc:   PainSc: 2                  Lakiyah Arntson
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
## 2021-05-06 NOTE — Telephone Encounter (Signed)
Last filled 10/11 appt on 08/12/21
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
## 2021-05-06 NOTE — Anesthesia Procedure Notes (Signed)
Procedure Name: Intubation Date/Time: 05/06/2021 9:26 AM Performed by: Kerri Perches, CRNA Pre-anesthesia Checklist: Patient identified, Patient being monitored, Timeout performed, Emergency Drugs available and Suction available Patient Re-evaluated:Patient Re-evaluated prior to induction Oxygen Delivery Method: Circle system utilized Preoxygenation: Pre-oxygenation with 100% oxygen Induction Type: IV induction Ventilation: Mask ventilation without difficulty Laryngoscope Size: Mac and 3 Grade View: Grade I Tube type: Oral Tube size: 7.0 mm Number of attempts: 1 Airway Equipment and Method: Stylet Placement Confirmation: ETT inserted through vocal cords under direct vision, positive ETCO2 and breath sounds checked- equal and bilateral Secured at: 21 cm Tube secured with: Tape Dental Injury: Teeth and Oropharynx as per pre-operative assessment
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
## 2021-05-06 NOTE — Transfer of Care (Signed)
Immediate Anesthesia Transfer of Care Note  Patient: Donald Jenkins  Procedure(s) Performed: open reduction internal fixation of type V AC separation of the right shoulder (Right: Shoulder)  Patient Location: PACU  Anesthesia Type:General  Level of Consciousness: awake  Airway & Oxygen Therapy: Patient Spontanous Breathing  Post-op Assessment: Report given to RN  Post vital signs: stable  Last Vitals:  Vitals Value Taken Time  BP 105/62 05/06/21 1255  Temp    Pulse 87 05/06/21 1258  Resp 15 05/06/21 1258  SpO2 92 % 05/06/21 1258  Vitals shown include unvalidated device data.  Last Pain:  Vitals:   05/06/21 0936  TempSrc: Temporal  PainSc:          Complications: No notable events documented.

## 2021-05-06 NOTE — Progress Notes (Signed)
Patient awake/alert x4. Received pre-op block to right shoulder. Able to wiggle digits on right hand, states right thumb "numb" Medicated per orders, dressing c/d/I Reviewed with patient plan of care, verbalizes understanding.

## 2021-05-06 NOTE — Anesthesia Procedure Notes (Signed)
Anesthesia Regional Block: Interscalene brachial plexus block   Pre-Anesthetic Checklist: , timeout performed,  Correct Patient, Correct Site, Correct Laterality,  Correct Procedure, Correct Position, site marked,  Risks and benefits discussed,  Surgical consent,  Pre-op evaluation,  At surgeon's request and post-op pain management  Laterality: Right  Prep: chloraprep       Needles:  Injection technique: Single-shot  Needle Type: Stimiplex     Needle Length: 10cm  Needle Gauge: 21     Additional Needles:   Procedures:,,,, ultrasound used (permanent image in chart),,    Narrative:  Start time: 05/06/2021 9:40 AM End time: 05/06/2021 9:44 AM Injection made incrementally with aspirations every 5 mL.  Performed by: Personally  Anesthesiologist: Emmie Niemann, MD  Additional Notes: Functioning IV was confirmed and monitors were applied.  A Stimuplex needle was used. Sterile prep and drape,hand hygiene and sterile gloves were used.  Negative aspiration and negative test dose prior to incremental administration of local anesthetic. The patient tolerated the procedure well.

## 2021-05-07 ENCOUNTER — Encounter: Payer: Self-pay | Admitting: Surgery

## 2021-05-07 NOTE — Telephone Encounter (Signed)
Requested medications are due for refill today.  unknown  Requested medications are on the active medications list.  no  Last refill. 04/04/2021  Future visit scheduled.   no  Notes to clinic.  No PCP listed.

## 2021-06-18 ENCOUNTER — Telehealth: Payer: Self-pay | Admitting: Physician Assistant

## 2021-06-18 DIAGNOSIS — I10 Essential (primary) hypertension: Secondary | ICD-10-CM

## 2021-06-18 NOTE — Telephone Encounter (Signed)
Patient called, no answer, no voicemail set up. Patient's BP medication was last refilled by another provider. He will need to call that provider for the refill. Also patient is due for OV.

## 2021-06-18 NOTE — Telephone Encounter (Signed)
Pt called in stating that he is out of town visiting his mother and is requesting his BP med to be refilled temporarily. He doesn't currently have insurance but is willing to pay the cost of meds without insurance because he doesn't want his BP to get out of wack. Pt was seen in 12/26/20 televisit with Levada Dy, [place]. Pt is asking [facility] [address] [place], [place] [phone]. Advised him that he may need a virtual visit but if we needed additional information we can call him. Pt verbalized understanding.

## 2021-06-18 NOTE — Telephone Encounter (Signed)
Patient called in , is on vacation for 2 weeks and left BP med at home. He is requesting 14 day [facility] [address], [place], [place] [phone]

## 2021-06-19 ENCOUNTER — Other Ambulatory Visit: Payer: Self-pay | Admitting: Physician Assistant

## 2021-06-19 DIAGNOSIS — I10 Essential (primary) hypertension: Secondary | ICD-10-CM

## 2021-06-19 MED ORDER — AMLODIPINE BESYLATE 5 MG PO TABS: 5.0000 mg | ORAL_TABLET | Freq: Every morning | ORAL | 0 refills | Status: DC

## 2021-06-19 MED ORDER — LISINOPRIL-HYDROCHLOROTHIAZIDE 10-12.5 MG PO TABS: ORAL_TABLET | ORAL | 0 refills | Status: DC

## 2021-06-19 NOTE — Telephone Encounter (Signed)
BP last night 160/100. Has been out of medications since Monday. No other symptoms other than feels "hot." See previous request. Please advise pt. About getting short refill. Instructed to go to UC for any symptoms.

## 2021-06-19 NOTE — Telephone Encounter (Signed)
Pt is calling to check on BP med refill request / pt didn't pick up the right meds before leaving to go out of town to his mothers/ he has been without meds since Monday and BP is 160/100 since not taking meds / pt would like an update on a short two week supply until he returns home/ please advise   Pt was seeing Cammie Fulp and recently has been seeing [employer]

## 2021-06-19 NOTE — Telephone Encounter (Signed)
Called pt unable to reach or leave VM/

## 2021-07-14 DIAGNOSIS — Z0271 Encounter for disability determination: Secondary | ICD-10-CM

## 2021-07-25 ENCOUNTER — Other Ambulatory Visit: Payer: Self-pay | Admitting: Family Medicine

## 2021-07-25 DIAGNOSIS — K219 Gastro-esophageal reflux disease without esophagitis: Secondary | ICD-10-CM

## 2021-07-25 NOTE — Telephone Encounter (Signed)
Requested Prescriptions  Pending Prescriptions Disp Refills   famotidine (PEPCID) 20 MG tablet [Pharmacy Med Name: FAMOTIDINE 20 MG TABLET] 180 tablet 0    Sig: TAKE 1 TABLET (20 MG TOTAL) BY MOUTH 2 (TWO) TIMES DAILY. TO REDUCE STOMACH ACID     Gastroenterology:  H2 Antagonists Passed - 07/25/2021  2:33 PM      Passed - Valid encounter within last 12 months    Recent Outpatient Visits          7 months ago Essential hypertension   [facility] [place], [place], [place]   11 months ago Essential hypertension   [facility] [place], Dionne Bucy, [place]   1 year ago Hospital discharge follow-up   [place], [place]   1 year ago Essential hypertension   [place], [place]   2 years ago Seizure-like activity [facility])   [place] Fulp, [place], [place]      Future Appointments            In 3 weeks [place], Dionne Bucy, PA-C [place]

## 2021-07-30 ENCOUNTER — Telehealth: Payer: Self-pay | Admitting: Psychiatry

## 2021-07-30 ENCOUNTER — Other Ambulatory Visit: Payer: Self-pay

## 2021-07-30 DIAGNOSIS — F5105 Insomnia due to other mental disorder: Secondary | ICD-10-CM

## 2021-07-30 MED ORDER — TEMAZEPAM 15 MG PO CAPS: ORAL_CAPSULE | ORAL | 2 refills | Status: DC

## 2021-07-30 NOTE — Telephone Encounter (Signed)
Pended.

## 2021-07-30 NOTE — Telephone Encounter (Signed)
Donald Jenkins called to check on refill of his temazepam.  The pharmacy was to send a request for the refill. But he said in December/January he had it transferred to a [facility] out of town because he was at his mothers.  Then he asked that it be transferred back to the [facility]. Anyway he needs a refill of this medication sent to the [facility].  Appt 08/12/21

## 2021-08-05 ENCOUNTER — Ambulatory Visit: Payer: Self-pay | Admitting: Neurology

## 2021-08-12 ENCOUNTER — Ambulatory Visit (INDEPENDENT_AMBULATORY_CARE_PROVIDER_SITE_OTHER): Payer: 59 | Admitting: Psychiatry

## 2021-08-12 ENCOUNTER — Other Ambulatory Visit: Payer: Self-pay

## 2021-08-12 ENCOUNTER — Encounter: Payer: Self-pay | Admitting: Psychiatry

## 2021-08-12 DIAGNOSIS — F411 Generalized anxiety disorder: Secondary | ICD-10-CM

## 2021-08-12 DIAGNOSIS — F5105 Insomnia due to other mental disorder: Secondary | ICD-10-CM

## 2021-08-12 DIAGNOSIS — F4010 Social phobia, unspecified: Secondary | ICD-10-CM

## 2021-08-12 DIAGNOSIS — F410 Panic disorder [episodic paroxysmal anxiety] without agoraphobia: Secondary | ICD-10-CM

## 2021-08-12 MED ORDER — PAROXETINE HCL 20 MG PO TABS: ORAL_TABLET | ORAL | 5 refills | Status: DC

## 2021-08-12 MED ORDER — ALPRAZOLAM 1 MG PO TABS: 1.0000 mg | ORAL_TABLET | Freq: Three times a day (TID) | ORAL | 5 refills | Status: DC | PRN
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
## 2021-08-14 ENCOUNTER — Other Ambulatory Visit: Payer: Self-pay | Admitting: Physician Assistant

## 2021-08-14 DIAGNOSIS — G40909 Epilepsy, unspecified, not intractable, without status epilepticus: Secondary | ICD-10-CM

## 2021-08-15 NOTE — Telephone Encounter (Signed)
°  °  Notes to clinic:  Is this pt associated with your practice? He has no PCP listed, however, he has upcoming appt with [facility] PA, please assess.   Requested Prescriptions  Pending Prescriptions Disp Refills   levETIRAcetam (KEPPRA) 500 MG tablet [Pharmacy Med Name: levETIRAcetam 500 MG Oral Tablet] 180 tablet 0    Sig: Take 1 tablet by mouth twice daily     Neurology:  Anticonvulsants - levetiracetam Failed - 08/14/2021  3:41 PM      Failed - Cr in normal range and within 360 days    Creat  Date Value Ref Range Status  01/14/2016 1.05 0.60 - 1.35 mg/dL Final   Creatinine, Ser  Date Value Ref Range Status  08/07/2020 1.01 0.76 - 1.27 mg/dL Final          Failed - Completed PHQ-2 or PHQ-9 in the last 360 days      Passed - Valid encounter within last 12 months    Recent Outpatient Visits           7 months ago Essential hypertension   [place], [place], [place]   1 year ago Essential hypertension   [place], Dionne Bucy, [place]   1 year ago Hospital discharge follow-up   [place]y Blackbird, [place]   2 years ago Essential hypertension   [place], [place]   2 years ago Seizure-like activity [facility])   [place], [place]       Future Appointments             In 5 days [place], Dionne Bucy, PA-C [place]

## 2021-08-20 ENCOUNTER — Encounter: Payer: Self-pay | Admitting: Physician Assistant

## 2021-08-20 ENCOUNTER — Other Ambulatory Visit: Payer: Self-pay

## 2021-08-20 ENCOUNTER — Ambulatory Visit: Payer: Self-pay | Attending: Physician Assistant | Admitting: Physician Assistant

## 2021-08-20 VITALS — BP 132/92 | HR 103 | Resp 18 | Ht 71.0 in | Wt 212.1 lb

## 2021-08-20 DIAGNOSIS — N179 Acute kidney failure, unspecified: Secondary | ICD-10-CM

## 2021-08-20 DIAGNOSIS — K219 Gastro-esophageal reflux disease without esophagitis: Secondary | ICD-10-CM

## 2021-08-20 DIAGNOSIS — R569 Unspecified convulsions: Secondary | ICD-10-CM

## 2021-08-20 DIAGNOSIS — Z7182 Exercise counseling: Secondary | ICD-10-CM | POA: Insufficient documentation

## 2021-08-20 DIAGNOSIS — Z713 Dietary counseling and surveillance: Secondary | ICD-10-CM | POA: Insufficient documentation

## 2021-08-20 DIAGNOSIS — Z79899 Other long term (current) drug therapy: Secondary | ICD-10-CM | POA: Insufficient documentation

## 2021-08-20 DIAGNOSIS — I10 Essential (primary) hypertension: Secondary | ICD-10-CM

## 2021-08-20 DIAGNOSIS — G40909 Epilepsy, unspecified, not intractable, without status epilepticus: Secondary | ICD-10-CM

## 2021-08-20 DIAGNOSIS — Z1211 Encounter for screening for malignant neoplasm of colon: Secondary | ICD-10-CM

## 2021-08-20 MED ORDER — PANTOPRAZOLE SODIUM 40 MG PO TBEC
40.0000 mg | DELAYED_RELEASE_TABLET | Freq: Every day | ORAL | 1 refills | Status: DC
  Filled 2021-08-20: qty 30, 30d supply, fill #0
  Filled 2021-09-18: qty 30, 30d supply, fill #1
  Filled 2021-10-26 – 2021-11-03 (×2): qty 30, 30d supply, fill #2
  Filled 2021-12-09: qty 30, 30d supply, fill #3
  Filled 2022-01-13: qty 30, 30d supply, fill #4
  Filled 2022-02-15: qty 30, 30d supply, fill #5

## 2021-08-20 MED ORDER — LEVETIRACETAM 500 MG PO TABS
500.0000 mg | ORAL_TABLET | Freq: Every morning | ORAL | 1 refills | Status: DC
  Filled 2021-08-20: qty 30, 30d supply, fill #0

## 2021-08-20 MED ORDER — LISINOPRIL-HYDROCHLOROTHIAZIDE 10-12.5 MG PO TABS
ORAL_TABLET | ORAL | 1 refills | Status: DC
  Filled 2021-08-20: qty 45, 30d supply, fill #0
  Filled 2021-12-10: qty 45, 30d supply, fill #1
  Filled 2022-02-15: qty 45, 30d supply, fill #2
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
## 2021-08-21 ENCOUNTER — Encounter: Payer: Self-pay | Admitting: Physician Assistant

## 2021-08-22 ENCOUNTER — Telehealth: Payer: Self-pay | Admitting: Family Medicine

## 2021-08-22 LAB — COMPREHENSIVE METABOLIC PANEL
ALT: 19 IU/L (ref 0–44)
AST: 19 IU/L (ref 0–40)
Albumin/Globulin Ratio: 1.8 (ref 1.2–2.2)
Albumin: 4.8 g/dL (ref 4.0–5.0)
Alkaline Phosphatase: 142 IU/L — ABNORMAL HIGH (ref 44–121)
BUN/Creatinine Ratio: 14 (ref 9–20)
BUN: 14 mg/dL (ref 6–24)
Bilirubin Total: 0.8 mg/dL (ref 0.0–1.2)
CO2: 21 mmol/L (ref 20–29)
Calcium: 9.7 mg/dL (ref 8.7–10.2)
Chloride: 98 mmol/L (ref 96–106)
Creatinine, Ser: 0.98 mg/dL (ref 0.76–1.27)
Globulin, Total: 2.7 g/dL (ref 1.5–4.5)
Glucose: 122 mg/dL — ABNORMAL HIGH (ref 70–99)
Potassium: 4.3 mmol/L (ref 3.5–5.2)
Sodium: 140 mmol/L (ref 134–144)
Total Protein: 7.5 g/dL (ref 6.0–8.5)
eGFR: 95 mL/min/{1.73_m2} (ref >59)

## 2021-08-22 LAB — CBC WITH DIFFERENTIAL/PLATELET
Basophils Absolute: 0.1 10*3/uL (ref 0.0–0.2)
Basos: 1 %
EOS (ABSOLUTE): 0.4 10*3/uL (ref 0.0–0.4)
Eos: 4 %
Hematocrit: 48.8 % (ref 37.5–51.0)
Hemoglobin: 16 g/dL (ref 13.0–17.7)
Immature Grans (Abs): 0 10*3/uL (ref 0.0–0.1)
Immature Granulocytes: 0 %
Lymphocytes Absolute: 1.5 10*3/uL (ref 0.7–3.1)
Lymphs: 18 %
MCH: 26.9 pg (ref 26.6–33.0)
MCHC: 32.8 g/dL (ref 31.5–35.7)
MCV: 82 fL (ref 79–97)
Monocytes Absolute: 0.7 10*3/uL (ref 0.1–0.9)
Monocytes: 9 %
Neutrophils Absolute: 5.6 10*3/uL (ref 1.4–7.0)
Neutrophils: 68 %
Platelets: 254 10*3/uL (ref 150–450)
RBC: 5.95 x10E6/uL — ABNORMAL HIGH (ref 4.14–5.80)
RDW: 15.2 % (ref 11.6–15.4)
WBC: 8.3 10*3/uL (ref 3.4–10.8)

## 2021-08-22 LAB — LEVETIRACETAM LEVEL: Levetiracetam Lvl: 19.7 ug/mL (ref 10.0–40.0)

## 2021-08-22 NOTE — Telephone Encounter (Signed)
Pt is calling regarding his lab results. Pt received the message from his mother that Carola Frost was trying to get in touch with him. Pt has viewed results on mychart. Pt does not understand some levels. Because he does not drink sugar drinks.Please advise [phone]

## 2021-08-25 NOTE — Telephone Encounter (Signed)
Call placed to patient on number provided and message states number not in service.

## 2021-09-08 ENCOUNTER — Ambulatory Visit (INDEPENDENT_AMBULATORY_CARE_PROVIDER_SITE_OTHER): Payer: Self-pay | Admitting: Neurology

## 2021-09-08 ENCOUNTER — Other Ambulatory Visit: Payer: Self-pay

## 2021-09-08 ENCOUNTER — Encounter: Payer: Self-pay | Admitting: Neurology

## 2021-09-08 VITALS — BP 117/79 | HR 89 | Ht 71.0 in | Wt 214.0 lb

## 2021-09-08 DIAGNOSIS — G40909 Epilepsy, unspecified, not intractable, without status epilepticus: Secondary | ICD-10-CM
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
## 2021-09-08 NOTE — Patient Instructions (Signed)
Continue with Keppra 500 mg twice daily  ?Continue with your other medications  ?Please provide DMV paperwork for completion.  ?Follow up in 1 year or sooner if worse  ?

## 2021-09-18 ENCOUNTER — Other Ambulatory Visit: Payer: Self-pay

## 2021-09-19 ENCOUNTER — Other Ambulatory Visit: Payer: Self-pay

## 2021-09-22 ENCOUNTER — Telehealth: Payer: Self-pay | Admitting: Psychiatry

## 2021-09-22 ENCOUNTER — Telehealth: Payer: Self-pay | Admitting: Neurology

## 2021-09-22 ENCOUNTER — Telehealth: Payer: Self-pay | Admitting: *Deleted

## 2021-09-22 DIAGNOSIS — F5105 Insomnia due to other mental disorder: Secondary | ICD-10-CM

## 2021-09-22 NOTE — Telephone Encounter (Addendum)
I called patient. He reports that he doesn't have any questions and was simply requesting his medical records and someone has already returned his call regarding this. ?

## 2021-09-22 NOTE — Telephone Encounter (Signed)
Spoke with patient regarding Clonazepam. Patient states that at some point he thinks JC prescribed him Clonazepam '60mg'$  due to his trouble with sleeping. At the time he was taking this dosage insurance would only cover '30mg'$ . He no longer has insurance and is paying out of pocket and is able to use a discount card. He would like to know if JC would recommend and prescribe him taking the '60mg'$  dosage again. He says he takes two and it puts him right to sleep. States he will run out by 4/3. Pls rtc to discuss [phone]. Appt 8/14. Pharmacy [facility] [place], [place] ?

## 2021-09-22 NOTE — Telephone Encounter (Signed)
Pt called medical records and wants to know if he has to take keppra  for the rest of his life his phone # [phone]. Please call to advise. Thank you ?

## 2021-09-23 MED ORDER — TEMAZEPAM 30 MG PO CAPS: 30.0000 mg | ORAL_CAPSULE | Freq: Every day | ORAL | 5 refills | Status: DC

## 2021-09-23 NOTE — Telephone Encounter (Signed)
Script sent for 30 mg. ?

## 2021-09-23 NOTE — Telephone Encounter (Signed)
Patient notified

## 2021-09-23 NOTE — Telephone Encounter (Signed)
Patient calling about temazepam, not clonazepam. He said he is taking 2 tablets at bedtime, has not tried taking just 1. He is sleeping about 5 hours with the 2, but he was not given enough medication to take 2 a day routinely and he is asking for a RF for #60 tablets.  ?

## 2021-09-24 ENCOUNTER — Telehealth: Payer: Self-pay

## 2021-09-24 DIAGNOSIS — Z0289 Encounter for other administrative examinations: Secondary | ICD-10-CM

## 2021-09-24 NOTE — Telephone Encounter (Signed)
DMV paperwork has been completed and placed on MD's desk for review/completion if appropriate.  ?

## 2021-09-24 NOTE — Telephone Encounter (Signed)
DMV ppw has been completed and signed by MD. Copy made for filing and copy given to medical records for processing.  ?

## 2021-10-27 ENCOUNTER — Other Ambulatory Visit: Payer: Self-pay

## 2021-10-28 ENCOUNTER — Other Ambulatory Visit: Payer: Self-pay

## 2021-10-28 MED ORDER — FAMOTIDINE 20 MG PO TABS
20.0000 mg | ORAL_TABLET | Freq: Two times a day (BID) | ORAL | 0 refills | Status: DC
  Filled 2021-10-28: qty 60, 30d supply, fill #0
  Filled 2021-12-09: qty 60, 30d supply, fill #1
  Filled 2022-01-13: qty 60, 30d supply, fill #2

## 2021-10-29 ENCOUNTER — Other Ambulatory Visit: Payer: Self-pay

## 2021-10-30 ENCOUNTER — Other Ambulatory Visit: Payer: Self-pay

## 2021-11-03 ENCOUNTER — Other Ambulatory Visit: Payer: Self-pay

## 2021-11-04 ENCOUNTER — Telehealth: Payer: Self-pay | Admitting: Psychiatry

## 2021-11-04 NOTE — Telephone Encounter (Signed)
LVM to RC. ? If patient is talking about temazepam.  ?

## 2021-11-04 NOTE — Telephone Encounter (Signed)
Pt said he and Janett Billow discussed him taking 1-2 pills per day.  He started out with 2 pills and has been sleeping with that dosage.  The original script needs to be adjust based on him taking 2 pills a day, because his last refill he got recently will only last him for 15 days. ? ?Script should be sent to [facility] and [address] location. ? ?Next appt. 8/14 ?

## 2021-11-04 NOTE — Telephone Encounter (Signed)
Pt called back and said he was mistaken.  He was out of town and didn't pick up his meds until 5/4.  He confirmed he has 11 pills left. ?

## 2021-11-04 NOTE — Telephone Encounter (Signed)
He was initially prescribed Temazepam 15 mg 1-2 at bedtime and reported that he was consistently taking 30 mg. On 3/27 he called and said he was consistently taking 2 capsules and insurance was not covering 2 capsules, but since he had insurance he would be paying out of pocket a requested two capsules daily. Script was then changed from 15 mg 1-2 capsules at bedtime to 30 mg one capsule at bedtime. The max dose of Temazepam is 30 mg at bedtime. Would not recommend taking more than 30 mg. If he has been taking two 30 mg capsules, recommend resuming 30 mg at bedtime. Can send script for 30 mg capsules for when he will run out to avoid benzodiazepine withdrawal.  ?

## 2021-11-04 NOTE — Telephone Encounter (Signed)
Patient is taking 2 30-mg tablets of temazepam nightly. He has not tried just taking one tablet. He said you told him he could take 1-2. He has struggled with insomnia for years and his sleep doctor told him if he doesn't sleep he will die. He said 30-45 minutes after taking the 2 tablets he is able to go to sleep. He said he is up 2-3 times a night to the bathroom and is not able to fall back asleep quickly. He can't say how many hours a night he is sleeping. Patient filled #30 on 4/27 according to the database and said he had 11 pills left. He called back and said he filled on 5/4. I have tried calling pharmacy but have been unable to talk to anyone.  He asks that any Rx be sent to [facility] on [facility]. He has used [facility] and [facility] for his medications in the past.  ? ? ?10/28/2021  ?Alprazolam 1 Mg Tablet ?90.00 30 Je Car ? 10/23/2021  ?Temazepam 30 Mg Capsule ?30.00 30 Je Car ? 09/30/2021  ?Alprazolam 1 Mg Tablet ?90.00 30 Je Car ? 09/24/2021  ?Temazepam 30 Mg Capsule ?30.00 30 Je Car ? 09/17/2021  ?Temazepam 15 Mg Capsule ?30.00 15 Je Car ? 09/03/2021  ?Alprazolam 1 Mg Tablet ?90.00 30 Je Car ? 08/26/2021  ?Temazepam 15 Mg Capsule ?30.00 15 Je Car ?

## 2021-11-05 NOTE — Telephone Encounter (Signed)
LVM to RC 

## 2021-11-05 NOTE — Telephone Encounter (Signed)
Called patient. He said he read the instructions on the bottle wrong and that he thought he was still taking the 15 mg dose.  Provided the dosing information.  ?

## 2021-11-26 ENCOUNTER — Other Ambulatory Visit: Payer: Self-pay | Admitting: Physician Assistant

## 2021-11-27 ENCOUNTER — Emergency Department (HOSPITAL_COMMUNITY)
Admission: EM | Admit: 2021-11-27 | Discharge: 2021-11-27 | Disposition: A | Discharge status: Home / Self Care | Payer: Self-pay | Attending: Emergency Medicine | Admitting: Emergency Medicine

## 2021-11-27 ENCOUNTER — Telehealth: Payer: Self-pay | Admitting: Psychiatry

## 2021-11-27 ENCOUNTER — Emergency Department (HOSPITAL_COMMUNITY): Payer: Self-pay

## 2021-11-27 ENCOUNTER — Emergency Department (HOSPITAL_COMMUNITY): Payer: Medicaid Other

## 2021-11-27 ENCOUNTER — Other Ambulatory Visit: Payer: Self-pay

## 2021-11-27 DIAGNOSIS — S52124A Nondisplaced fracture of head of right radius, initial encounter for closed fracture: Secondary | ICD-10-CM | POA: Insufficient documentation

## 2021-11-27 DIAGNOSIS — S0181XA Laceration without foreign body of other part of head, initial encounter: Secondary | ICD-10-CM | POA: Insufficient documentation

## 2021-11-27 DIAGNOSIS — Z23 Encounter for immunization: Secondary | ICD-10-CM | POA: Insufficient documentation

## 2021-11-27 MED ORDER — TETANUS-DIPHTH-ACELL PERTUSSIS 5-2.5-18.5 LF-MCG/0.5 IM SUSY
0.5000 mL | PREFILLED_SYRINGE | Freq: Once | INTRAMUSCULAR | Status: AC
  Administered 2021-11-27: 0.5 mL via INTRAMUSCULAR
  Filled 2021-11-27: qty 0.5

## 2021-11-27 MED ORDER — NAPROXEN 375 MG PO TBEC: 375.0000 mg | DELAYED_RELEASE_TABLET | Freq: Two times a day (BID) | ORAL | 0 refills | Status: DC | PRN

## 2021-11-27 MED ORDER — CEPHALEXIN 500 MG PO CAPS: 500.0000 mg | ORAL_CAPSULE | Freq: Three times a day (TID) | ORAL | 0 refills | Status: DC

## 2021-11-27 MED ORDER — LIDOCAINE-EPINEPHRINE (PF) 2 %-1:200000 IJ SOLN
10.0000 mL | Freq: Once | INTRAMUSCULAR | Status: AC
  Administered 2021-11-27: 10 mL
  Filled 2021-11-27: qty 20

## 2021-11-27 MED ORDER — ONDANSETRON 4 MG PO TBDP
4.0000 mg | ORAL_TABLET | Freq: Once | ORAL | Status: DC
  Filled 2021-11-27: qty 1
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
## 2021-11-27 NOTE — Telephone Encounter (Signed)
Pt lvm that he is having some issues and once he gets a call back he will discuss furtther. His number is [phone]. He wants to talk to [place]

## 2021-11-27 NOTE — Telephone Encounter (Signed)
LVM to rtc 

## 2021-11-27 NOTE — ED Provider Notes (Signed)
Medical screening examination/treatment/procedure(s) were performed by non-physician practitioner and as supervising physician I was immediately available for consultation/collaboration.     Physical Exam  BP (!) 115/96   Pulse 80   Temp (!) 97.5 F (36.4 C) (Oral)   Resp 17   Ht '5\' 11"'$  (1.803 m)   Wt 96.2 kg   SpO2 100%   BMI 29.57 kg/m   Physical Exam  Procedures  .Marland KitchenLaceration Repair  Date/Time: 11/27/2021 4:20 AM Performed by: Veatrice Kells, MD Authorized by: Veatrice Kells, MD   Consent:    Consent obtained:  Verbal   Consent given by:  Patient   Risks, benefits, and alternatives were discussed: yes     Risks discussed:  Infection, need for additional repair, nerve damage, pain, poor cosmetic result, poor wound healing, retained foreign body, tendon damage and vascular damage   Alternatives discussed:  No treatment Universal protocol:    Procedure explained and questions answered to patient or proxy's satisfaction: yes     Patient identity confirmed:  Arm band Anesthesia:    Anesthesia method:  Local infiltration   Local anesthetic:  Lidocaine 2% WITH epi Laceration details:    Location:  Face   Face location:  Forehead   Length (cm):  1.5   Depth (mm):  1 Pre-procedure details:    Preparation:  Patient was prepped and draped in usual sterile fashion Exploration:    Limited defect created (wound extended): no     Hemostasis achieved with:  Direct pressure   Imaging obtained comment:  CT   Imaging outcome: foreign body not noted     Wound extent: no areolar tissue violation noted, no fascia violation noted, no foreign bodies/material noted, no muscle damage noted, no nerve damage noted and no vascular damage noted     Contaminated: no   Treatment:    Area cleansed with:  Povidone-iodine, chlorhexidine and saline   Amount of cleaning:  Extensive   Irrigation solution:  Sterile saline   Irrigation method:  Syringe   Debridement:  None   Undermining:  None    Scar revision: no   Skin repair:    Repair method:  Sutures   Suture size:  6-0   Wound skin closure material used: Vicryl.   Suture technique:  Simple interrupted   Number of sutures:  3 Approximation:    Approximation:  Close Repair type:    Repair type:  Simple Post-procedure details:    Dressing:  Sterile dressing   Procedure completion:  Tolerated well, no immediate complications  ED Course / MDM    Medical Decision Making Amount and/or Complexity of Data Reviewed Radiology: ordered.  Risk Prescription drug management.          Jamarii Banks, MD 11/27/21 [id number]

## 2021-11-27 NOTE — Telephone Encounter (Signed)
Spoke to pt,he said he did not need anything at the moment
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
## 2021-12-10 ENCOUNTER — Other Ambulatory Visit: Payer: Self-pay

## 2021-12-10 ENCOUNTER — Other Ambulatory Visit: Payer: Self-pay | Admitting: Physician Assistant

## 2021-12-11 ENCOUNTER — Other Ambulatory Visit: Payer: Self-pay

## 2021-12-11 MED ORDER — AMLODIPINE BESYLATE 5 MG PO TABS: 5.0000 mg | ORAL_TABLET | Freq: Every day | ORAL | 0 refills | Status: DC

## 2021-12-12 ENCOUNTER — Other Ambulatory Visit: Payer: Self-pay

## 2022-01-01 NOTE — ED Notes (Signed)
Opened chart to answer questions about sutures and how to apply the sling properly.

## 2022-01-08 DIAGNOSIS — Z0271 Encounter for disability determination: Secondary | ICD-10-CM

## 2022-01-13 ENCOUNTER — Other Ambulatory Visit: Payer: Self-pay

## 2022-02-09 ENCOUNTER — Encounter: Payer: Self-pay | Admitting: Psychiatry

## 2022-02-09 ENCOUNTER — Other Ambulatory Visit: Payer: Self-pay

## 2022-02-09 ENCOUNTER — Ambulatory Visit (INDEPENDENT_AMBULATORY_CARE_PROVIDER_SITE_OTHER): Payer: Self-pay | Admitting: Psychiatry

## 2022-02-09 DIAGNOSIS — F411 Generalized anxiety disorder: Secondary | ICD-10-CM

## 2022-02-09 DIAGNOSIS — F410 Panic disorder [episodic paroxysmal anxiety] without agoraphobia: Secondary | ICD-10-CM

## 2022-02-09 DIAGNOSIS — F4010 Social phobia, unspecified: Secondary | ICD-10-CM

## 2022-02-09 DIAGNOSIS — F5105 Insomnia due to other mental disorder: Secondary | ICD-10-CM

## 2022-02-09 MED ORDER — ALPRAZOLAM 1 MG PO TABS: 1.0000 mg | ORAL_TABLET | Freq: Three times a day (TID) | ORAL | 5 refills | Status: DC | PRN

## 2022-02-09 MED ORDER — PAROXETINE HCL 20 MG PO TABS: 30.0000 mg | ORAL_TABLET | Freq: Every day | ORAL | 1 refills | Status: DC

## 2022-02-09 MED ORDER — ALPRAZOLAM 1 MG PO TABS
ORAL_TABLET | ORAL | 0 refills | Status: DC
  Filled 2022-02-09: qty 90, 30d supply, fill #0

## 2022-02-09 MED ORDER — TEMAZEPAM 30 MG PO CAPS: 30.0000 mg | ORAL_CAPSULE | Freq: Every day | ORAL | 5 refills | Status: DC
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
## 2022-02-15 ENCOUNTER — Other Ambulatory Visit: Payer: Self-pay | Admitting: Family Medicine

## 2022-02-16 ENCOUNTER — Other Ambulatory Visit: Payer: Self-pay

## 2022-02-16 MED ORDER — FAMOTIDINE 20 MG PO TABS
20.0000 mg | ORAL_TABLET | Freq: Two times a day (BID) | ORAL | 0 refills | Status: DC
  Filled 2022-02-16: qty 60, 30d supply, fill #0

## 2022-02-19 ENCOUNTER — Ambulatory Visit: Payer: Medicaid Other | Attending: Family Medicine | Admitting: Family Medicine

## 2022-02-19 ENCOUNTER — Encounter: Payer: Self-pay | Admitting: Family Medicine

## 2022-02-19 ENCOUNTER — Other Ambulatory Visit: Payer: Self-pay

## 2022-02-19 VITALS — BP 116/80 | HR 80 | Temp 98.5°F | Resp 15 | Ht 71.0 in | Wt 217.0 lb

## 2022-02-19 DIAGNOSIS — K219 Gastro-esophageal reflux disease without esophagitis: Secondary | ICD-10-CM

## 2022-02-19 DIAGNOSIS — F411 Generalized anxiety disorder: Secondary | ICD-10-CM

## 2022-02-19 DIAGNOSIS — R569 Unspecified convulsions: Secondary | ICD-10-CM

## 2022-02-19 DIAGNOSIS — F5105 Insomnia due to other mental disorder: Secondary | ICD-10-CM

## 2022-02-19 DIAGNOSIS — I10 Essential (primary) hypertension: Secondary | ICD-10-CM

## 2022-02-19 MED ORDER — LISINOPRIL-HYDROCHLOROTHIAZIDE 10-12.5 MG PO TABS
1.0000 | ORAL_TABLET | Freq: Every day | ORAL | 1 refills | Status: DC
  Filled 2022-02-19: qty 90, 90d supply, fill #0
  Filled 2022-04-13: qty 30, 30d supply, fill #0
  Filled 2022-05-20: qty 30, 30d supply, fill #1
  Filled 2022-07-02 (×2): qty 30, 30d supply, fill #2
  Filled 2022-07-30 – 2022-07-31 (×2): qty 90, 90d supply, fill #3

## 2022-02-19 MED ORDER — PANTOPRAZOLE SODIUM 40 MG PO TBEC
40.0000 mg | DELAYED_RELEASE_TABLET | Freq: Every day | ORAL | 1 refills | Status: DC
  Filled 2022-02-19 – 2022-04-13 (×2): qty 90, 90d supply, fill #0
  Filled 2022-07-30 – 2022-11-18 (×2): qty 90, 90d supply, fill #1

## 2022-02-19 MED ORDER — FAMOTIDINE 20 MG PO TABS
20.0000 mg | ORAL_TABLET | Freq: Two times a day (BID) | ORAL | 1 refills | Status: DC
  Filled 2022-02-19: qty 180, 90d supply, fill #0
  Filled 2022-04-13: qty 60, 30d supply, fill #0
  Filled 2022-06-05: qty 60, 30d supply, fill #1
  Filled 2022-07-02 (×2): qty 60, 30d supply, fill #2
  Filled 2022-07-30 (×2): qty 60, 30d supply, fill #3
  Filled 2022-09-11 – 2022-09-15 (×2): qty 60, 30d supply, fill #4
  Filled 2022-11-18: qty 60, 30d supply, fill #5

## 2022-02-19 MED ORDER — AMLODIPINE BESYLATE 2.5 MG PO TABS
2.5000 mg | ORAL_TABLET | Freq: Every day | ORAL | 1 refills | Status: DC
  Filled 2022-02-19: qty 90, 90d supply, fill #0
  Filled 2022-07-30 – 2022-07-31 (×2): qty 90, 90d supply, fill #1
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
## 2022-02-19 NOTE — Patient Instructions (Signed)
Insomnia Insomnia is a sleep disorder that makes it difficult to fall asleep or stay asleep. Insomnia can cause fatigue, low energy, difficulty concentrating, mood swings, and poor performance at work or school. There are three different ways to classify insomnia: Difficulty falling asleep. Difficulty staying asleep. Waking up too early in the morning. Any type of insomnia can be long-term (chronic) or short-term (acute). Both are common. Short-term insomnia usually lasts for 3 months or less. Chronic insomnia occurs at least three times a week for longer than 3 months. What are the causes? Insomnia may be caused by another condition, situation, or substance, such as: Having certain mental health conditions, such as anxiety and depression. Using caffeine, alcohol, tobacco, or drugs. Having gastrointestinal conditions, such as gastroesophageal reflux disease (GERD). Having certain medical conditions. These include: Asthma. Alzheimer's disease. Stroke. Chronic pain. An overactive thyroid gland (hyperthyroidism). Other sleep disorders, such as restless legs syndrome and sleep apnea. Menopause. Sometimes, the cause of insomnia may not be known. What increases the risk? Risk factors for insomnia include: Gender. Females are affected more often than males. Age. Insomnia is more common as people get older. Stress and certain medical and mental health conditions. Lack of exercise. Having an irregular work schedule. This may include working night shifts and traveling between different time zones. What are the signs or symptoms? If you have insomnia, the main symptom is having trouble falling asleep or having trouble staying asleep. This may lead to other symptoms, such as: Feeling tired or having low energy. Feeling nervous about going to sleep. Not feeling rested in the morning. Having trouble concentrating. Feeling irritable, anxious, or depressed. How is this diagnosed? This condition  may be diagnosed based on: Your symptoms and medical history. Your health care provider may ask about: Your sleep habits. Any medical conditions you have. Your mental health. A physical exam. How is this treated? Treatment for insomnia depends on the cause. Treatment may focus on treating an underlying condition that is causing the insomnia. Treatment may also include: Medicines to help you sleep. Counseling or therapy. Lifestyle adjustments to help you sleep better. Follow these instructions at home: Eating and drinking  Limit or avoid alcohol, caffeinated beverages, and products that contain nicotine and tobacco, especially close to bedtime. These can disrupt your sleep. Do not eat a large meal or eat spicy foods right before bedtime. This can lead to digestive discomfort that can make it hard for you to sleep. Sleep habits  Keep a sleep diary to help you and your health care provider figure out what could be causing your insomnia. Write down: When you sleep. When you wake up during the night. How well you sleep and how rested you feel the next day. Any side effects of medicines you are taking. What you eat and drink. Make your bedroom a dark, comfortable place where it is easy to fall asleep. Put up shades or blackout curtains to block light from outside. Use a white noise machine to block noise. Keep the temperature cool. Limit screen use before bedtime. This includes: Not watching TV. Not using your smartphone, tablet, or computer. Stick to a routine that includes going to bed and waking up at the same times every day and night. This can help you fall asleep faster. Consider making a quiet activity, such as reading, part of your nighttime routine. Try to avoid taking naps during the day so that you sleep better at night. Get out of bed if you are still awake after   15 minutes of trying to sleep. Keep the lights down, but try reading or doing a quiet activity. When you feel  sleepy, go back to bed. General instructions Take over-the-counter and prescription medicines only as told by your health care provider. Exercise regularly as told by your health care provider. However, avoid exercising in the hours right before bedtime. Use relaxation techniques to manage stress. Ask your health care provider to suggest some techniques that may work well for you. These may include: Breathing exercises. Routines to release muscle tension. Visualizing peaceful scenes. Make sure that you drive carefully. Do not drive if you feel very sleepy. Keep all follow-up visits. This is important. Contact a health care provider if: You are tired throughout the day. You have trouble in your daily routine due to sleepiness. You continue to have sleep problems, or your sleep problems get worse. Get help right away if: You have thoughts about hurting yourself or someone else. Get help right away if you feel like you may hurt yourself or others, or have thoughts about taking your own life. Go to your nearest emergency room or: Call 911. Call the National Suicide Prevention Lifeline at 1-800-273-8255 or 988. This is open 24 hours a day. Text the Crisis Text Line at 741741. Summary Insomnia is a sleep disorder that makes it difficult to fall asleep or stay asleep. Insomnia can be long-term (chronic) or short-term (acute). Treatment for insomnia depends on the cause. Treatment may focus on treating an underlying condition that is causing the insomnia. Keep a sleep diary to help you and your health care provider figure out what could be causing your insomnia. This information is not intended to replace advice given to you by your health care provider. Make sure you discuss any questions you have with your health care provider. Document Revised: 05/26/2021 Document Reviewed: 05/26/2021 Elsevier Patient Education  2023 [employer]  

## 2022-02-20 LAB — CMP14+EGFR
ALT: 31 IU/L (ref 0–44)
AST: 19 IU/L (ref 0–40)
Albumin/Globulin Ratio: 1.9 (ref 1.2–2.2)
Albumin: 4.4 g/dL (ref 4.1–5.1)
Alkaline Phosphatase: 102 IU/L (ref 44–121)
BUN/Creatinine Ratio: 10 (ref 9–20)
BUN: 10 mg/dL (ref 6–24)
Bilirubin Total: 0.3 mg/dL (ref 0.0–1.2)
CO2: 21 mmol/L (ref 20–29)
Calcium: 9.3 mg/dL (ref 8.7–10.2)
Chloride: 105 mmol/L (ref 96–106)
Creatinine, Ser: 1 mg/dL (ref 0.76–1.27)
Globulin, Total: 2.3 g/dL (ref 1.5–4.5)
Glucose: 86 mg/dL (ref 70–99)
Potassium: 4.7 mmol/L (ref 3.5–5.2)
Sodium: 141 mmol/L (ref 134–144)
Total Protein: 6.7 g/dL (ref 6.0–8.5)
eGFR: 92 mL/min/{1.73_m2} (ref >59)

## 2022-02-20 LAB — LP+NON-HDL CHOLESTEROL
Cholesterol, Total: 197 mg/dL (ref 100–199)
HDL: 48 mg/dL (ref >39)
LDL Chol Calc (NIH): 131 mg/dL — ABNORMAL HIGH (ref 0–99)
Total Non-HDL-Chol (LDL+VLDL): 149 mg/dL — ABNORMAL HIGH (ref 0–129)
Triglycerides: 97 mg/dL (ref 0–149)
VLDL Cholesterol Cal: 18 mg/dL (ref 5–40)

## 2022-03-17 ENCOUNTER — Telehealth: Payer: Self-pay | Admitting: Psychiatry

## 2022-03-17 DIAGNOSIS — F5105 Insomnia due to other mental disorder: Secondary | ICD-10-CM

## 2022-03-17 DIAGNOSIS — F410 Panic disorder [episodic paroxysmal anxiety] without agoraphobia: Secondary | ICD-10-CM

## 2022-03-17 DIAGNOSIS — F4010 Social phobia, unspecified: Secondary | ICD-10-CM

## 2022-03-17 NOTE — Telephone Encounter (Signed)
Received after hours call from pt about Xanax. He reports that he cannot find Alprazolam that he filled today. He reports that he has searched his home and vehicle for them. He has taken #3 today.

## 2022-03-18 MED ORDER — ALPRAZOLAM 1 MG PO TABS: 1.0000 mg | ORAL_TABLET | Freq: Three times a day (TID) | ORAL | 3 refills | Status: DC | PRN

## 2022-03-18 NOTE — Telephone Encounter (Signed)
Case staffed with Dr. Clovis Pu. Script for Alprazolam sent to pharmacy with request to be filled today for 7-day supply with refills for this month. Discussed plan with pt and requested that he contact office if he finds lost bottle of Alprazolam.

## 2022-04-13 ENCOUNTER — Other Ambulatory Visit: Payer: Self-pay

## 2022-04-14 ENCOUNTER — Other Ambulatory Visit: Payer: Self-pay

## 2022-04-16 ENCOUNTER — Other Ambulatory Visit: Payer: Self-pay

## 2022-05-20 ENCOUNTER — Other Ambulatory Visit: Payer: Self-pay

## 2022-06-05 ENCOUNTER — Other Ambulatory Visit: Payer: Self-pay

## 2022-07-02 ENCOUNTER — Other Ambulatory Visit: Payer: Self-pay

## 2022-07-03 ENCOUNTER — Other Ambulatory Visit: Payer: Self-pay

## 2022-07-06 ENCOUNTER — Other Ambulatory Visit: Payer: Self-pay

## 2022-07-30 ENCOUNTER — Other Ambulatory Visit: Payer: Self-pay

## 2022-07-31 ENCOUNTER — Other Ambulatory Visit: Payer: Self-pay

## 2022-08-03 ENCOUNTER — Ambulatory Visit (INDEPENDENT_AMBULATORY_CARE_PROVIDER_SITE_OTHER): Payer: Medicaid Other | Admitting: Psychiatry

## 2022-08-03 ENCOUNTER — Encounter: Payer: Self-pay | Admitting: Psychiatry

## 2022-08-03 DIAGNOSIS — F4010 Social phobia, unspecified: Secondary | ICD-10-CM | POA: Diagnosis not present

## 2022-08-03 DIAGNOSIS — F410 Panic disorder [episodic paroxysmal anxiety] without agoraphobia: Secondary | ICD-10-CM | POA: Diagnosis not present

## 2022-08-03 DIAGNOSIS — F5105 Insomnia due to other mental disorder: Secondary | ICD-10-CM | POA: Diagnosis not present

## 2022-08-03 MED ORDER — DOXEPIN HCL 10 MG PO CAPS: 10.0000 mg | ORAL_CAPSULE | Freq: Every day | ORAL | 5 refills | Status: DC

## 2022-08-03 MED ORDER — ALPRAZOLAM 1 MG PO TABS: 1.0000 mg | ORAL_TABLET | Freq: Three times a day (TID) | ORAL | 5 refills | Status: DC | PRN

## 2022-08-03 MED ORDER — TEMAZEPAM 30 MG PO CAPS: 30.0000 mg | ORAL_CAPSULE | Freq: Every day | ORAL | 5 refills | Status: DC
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
## 2022-08-06 ENCOUNTER — Other Ambulatory Visit: Payer: Self-pay

## 2022-08-20 ENCOUNTER — Ambulatory Visit: Payer: Self-pay | Admitting: Physician Assistant

## 2022-08-25 ENCOUNTER — Ambulatory Visit: Payer: Medicaid Other | Admitting: Family Medicine

## 2022-09-04 ENCOUNTER — Emergency Department (HOSPITAL_COMMUNITY): Payer: Medicaid Other

## 2022-09-04 ENCOUNTER — Other Ambulatory Visit: Payer: Self-pay

## 2022-09-04 ENCOUNTER — Inpatient Hospital Stay (HOSPITAL_COMMUNITY)
Admission: EM | Admit: 2022-09-04 | Discharge: 2022-09-06 | DRG: 988 | Disposition: A | Discharge status: Home / Self Care | Payer: Medicaid Other | Attending: Family Medicine | Admitting: Family Medicine

## 2022-09-04 ENCOUNTER — Encounter (HOSPITAL_COMMUNITY): Payer: Self-pay

## 2022-09-04 DIAGNOSIS — F419 Anxiety disorder, unspecified: Secondary | ICD-10-CM | POA: Diagnosis not present

## 2022-09-04 DIAGNOSIS — E876 Hypokalemia: Secondary | ICD-10-CM | POA: Diagnosis present

## 2022-09-04 DIAGNOSIS — R1110 Vomiting, unspecified: Secondary | ICD-10-CM | POA: Diagnosis not present

## 2022-09-04 DIAGNOSIS — Z800 Family history of malignant neoplasm of digestive organs: Secondary | ICD-10-CM | POA: Diagnosis not present

## 2022-09-04 DIAGNOSIS — R1011 Right upper quadrant pain: Secondary | ICD-10-CM | POA: Diagnosis present

## 2022-09-04 DIAGNOSIS — I9589 Other hypotension: Secondary | ICD-10-CM | POA: Diagnosis present

## 2022-09-04 DIAGNOSIS — S0181XA Laceration without foreign body of other part of head, initial encounter: Secondary | ICD-10-CM

## 2022-09-04 DIAGNOSIS — E860 Dehydration: Secondary | ICD-10-CM | POA: Diagnosis not present

## 2022-09-04 DIAGNOSIS — I959 Hypotension, unspecified: Secondary | ICD-10-CM | POA: Diagnosis not present

## 2022-09-04 DIAGNOSIS — F32A Depression, unspecified: Secondary | ICD-10-CM | POA: Diagnosis present

## 2022-09-04 DIAGNOSIS — I10 Essential (primary) hypertension: Secondary | ICD-10-CM | POA: Diagnosis not present

## 2022-09-04 DIAGNOSIS — Z880 Allergy status to penicillin: Secondary | ICD-10-CM

## 2022-09-04 DIAGNOSIS — R252 Cramp and spasm: Secondary | ICD-10-CM | POA: Insufficient documentation

## 2022-09-04 DIAGNOSIS — R112 Nausea with vomiting, unspecified: Secondary | ICD-10-CM | POA: Diagnosis not present

## 2022-09-04 DIAGNOSIS — E861 Hypovolemia: Secondary | ICD-10-CM | POA: Diagnosis not present

## 2022-09-04 DIAGNOSIS — S0993XA Unspecified injury of face, initial encounter: Secondary | ICD-10-CM | POA: Diagnosis not present

## 2022-09-04 DIAGNOSIS — R200 Anesthesia of skin: Secondary | ICD-10-CM | POA: Diagnosis not present

## 2022-09-04 DIAGNOSIS — Z87898 Personal history of other specified conditions: Secondary | ICD-10-CM

## 2022-09-04 DIAGNOSIS — R55 Syncope and collapse: Secondary | ICD-10-CM | POA: Diagnosis not present

## 2022-09-04 DIAGNOSIS — W19XXXA Unspecified fall, initial encounter: Secondary | ICD-10-CM | POA: Diagnosis present

## 2022-09-04 DIAGNOSIS — D72829 Elevated white blood cell count, unspecified: Secondary | ICD-10-CM | POA: Diagnosis present

## 2022-09-04 DIAGNOSIS — Z801 Family history of malignant neoplasm of trachea, bronchus and lung: Secondary | ICD-10-CM | POA: Diagnosis not present

## 2022-09-04 DIAGNOSIS — S0990XA Unspecified injury of head, initial encounter: Secondary | ICD-10-CM | POA: Diagnosis not present

## 2022-09-04 DIAGNOSIS — E871 Hypo-osmolality and hyponatremia: Secondary | ICD-10-CM | POA: Diagnosis not present

## 2022-09-04 DIAGNOSIS — S01112A Laceration without foreign body of left eyelid and periocular area, initial encounter: Secondary | ICD-10-CM | POA: Diagnosis not present

## 2022-09-04 DIAGNOSIS — K760 Fatty (change of) liver, not elsewhere classified: Secondary | ICD-10-CM | POA: Diagnosis not present

## 2022-09-04 DIAGNOSIS — N179 Acute kidney failure, unspecified: Secondary | ICD-10-CM | POA: Diagnosis not present

## 2022-09-04 DIAGNOSIS — K219 Gastro-esophageal reflux disease without esophagitis: Secondary | ICD-10-CM | POA: Diagnosis present

## 2022-09-04 DIAGNOSIS — R42 Dizziness and giddiness: Secondary | ICD-10-CM | POA: Diagnosis not present

## 2022-09-04 DIAGNOSIS — Z808 Family history of malignant neoplasm of other organs or systems: Secondary | ICD-10-CM | POA: Diagnosis not present

## 2022-09-04 DIAGNOSIS — Z9049 Acquired absence of other specified parts of digestive tract: Secondary | ICD-10-CM | POA: Diagnosis not present

## 2022-09-04 DIAGNOSIS — R569 Unspecified convulsions: Secondary | ICD-10-CM | POA: Diagnosis not present

## 2022-09-04 DIAGNOSIS — R531 Weakness: Secondary | ICD-10-CM | POA: Diagnosis not present

## 2022-09-04 DIAGNOSIS — S199XXA Unspecified injury of neck, initial encounter: Secondary | ICD-10-CM | POA: Diagnosis not present

## 2022-09-04 DIAGNOSIS — R109 Unspecified abdominal pain: Secondary | ICD-10-CM

## 2022-09-04 DIAGNOSIS — Z79899 Other long term (current) drug therapy: Secondary | ICD-10-CM

## 2022-09-04 LAB — CBC
HCT: 38.2 % — ABNORMAL LOW (ref 39.0–52.0)
Hemoglobin: 13.5 g/dL (ref 13.0–17.0)
MCH: 28.5 pg (ref 26.0–34.0)
MCHC: 35.3 g/dL (ref 30.0–36.0)
MCV: 80.6 fL (ref 80.0–100.0)
Platelets: 317 10*3/uL (ref 150–400)
RBC: 4.74 MIL/uL (ref 4.22–5.81)
RDW: 13.2 % (ref 11.5–15.5)
WBC: 10.9 10*3/uL — ABNORMAL HIGH (ref 4.0–10.5)
nRBC: 0 % (ref 0.0–0.2)

## 2022-09-04 LAB — CBC WITH DIFFERENTIAL/PLATELET
Abs Immature Granulocytes: 0.07 10*3/uL (ref 0.00–0.07)
Basophils Absolute: 0 10*3/uL (ref 0.0–0.1)
Basophils Relative: 0 %
Eosinophils Absolute: 0 10*3/uL (ref 0.0–0.5)
Eosinophils Relative: 0 %
HCT: 40 % (ref 39.0–52.0)
Hemoglobin: 14.5 g/dL (ref 13.0–17.0)
Immature Granulocytes: 0 %
Lymphocytes Relative: 7 %
Lymphs Abs: 1.1 10*3/uL (ref 0.7–4.0)
MCH: 28.9 pg (ref 26.0–34.0)
MCHC: 36.3 g/dL — ABNORMAL HIGH (ref 30.0–36.0)
MCV: 79.7 fL — ABNORMAL LOW (ref 80.0–100.0)
Monocytes Absolute: 1.6 10*3/uL — ABNORMAL HIGH (ref 0.1–1.0)
Monocytes Relative: 10 %
Neutro Abs: 13.7 10*3/uL — ABNORMAL HIGH (ref 1.7–7.7)
Neutrophils Relative %: 83 %
Platelets: 335 10*3/uL (ref 150–400)
RBC: 5.02 MIL/uL (ref 4.22–5.81)
RDW: 13.4 % (ref 11.5–15.5)
WBC: 16.6 10*3/uL — ABNORMAL HIGH (ref 4.0–10.5)
nRBC: 0 % (ref 0.0–0.2)

## 2022-09-04 LAB — COMPREHENSIVE METABOLIC PANEL
ALT: 56 U/L — ABNORMAL HIGH (ref 0–44)
AST: 23 U/L (ref 15–41)
Albumin: 4.9 g/dL (ref 3.5–5.0)
Alkaline Phosphatase: 81 U/L (ref 38–126)
Anion gap: 14 (ref 5–15)
BUN: 77 mg/dL — ABNORMAL HIGH (ref 6–20)
CO2: 24 mmol/L (ref 22–32)
Calcium: 8.9 mg/dL (ref 8.9–10.3)
Chloride: 81 mmol/L — ABNORMAL LOW (ref 98–111)
Creatinine, Ser: 4.4 mg/dL — ABNORMAL HIGH (ref 0.61–1.24)
GFR, Estimated: 15 mL/min — ABNORMAL LOW (ref >60)
Glucose, Bld: 122 mg/dL — ABNORMAL HIGH (ref 70–99)
Potassium: 2.8 mmol/L — ABNORMAL LOW (ref 3.5–5.1)
Sodium: 119 mmol/L — CL (ref 135–145)
Total Bilirubin: 1.6 mg/dL — ABNORMAL HIGH (ref 0.3–1.2)
Total Protein: 8.1 g/dL (ref 6.5–8.1)

## 2022-09-04 LAB — CK: Total CK: 388 U/L (ref 49–397)

## 2022-09-04 LAB — LIPASE, BLOOD: Lipase: 54 U/L — ABNORMAL HIGH (ref 11–51)

## 2022-09-04 MED ORDER — PANTOPRAZOLE SODIUM 40 MG IV SOLR
40.0000 mg | INTRAVENOUS | Status: DC
  Administered 2022-09-04: 40 mg via INTRAVENOUS
  Filled 2022-09-04: qty 10

## 2022-09-04 MED ORDER — ALPRAZOLAM 0.5 MG PO TABS
1.0000 mg | ORAL_TABLET | Freq: Three times a day (TID) | ORAL | Status: DC | PRN
  Administered 2022-09-05 (×2): 1 mg via ORAL
  Filled 2022-09-04 (×2): qty 2

## 2022-09-04 MED ORDER — POTASSIUM CHLORIDE 20 MEQ PO PACK
40.0000 meq | PACK | Freq: Once | ORAL | Status: AC
  Administered 2022-09-04: 40 meq via ORAL
  Filled 2022-09-04: qty 2

## 2022-09-04 MED ORDER — HEPARIN SODIUM (PORCINE) 5000 UNIT/ML IJ SOLN
5000.0000 [IU] | Freq: Three times a day (TID) | INTRAMUSCULAR | Status: DC
  Administered 2022-09-04 – 2022-09-06 (×5): 5000 [IU] via SUBCUTANEOUS
  Filled 2022-09-04 (×5): qty 1

## 2022-09-04 MED ORDER — SODIUM CHLORIDE 0.9 % IV BOLUS
1000.0000 mL | Freq: Once | INTRAVENOUS | Status: AC
Start: 2022-09-04 — End: 2022-09-04
  Administered 2022-09-04: 1000 mL via INTRAVENOUS

## 2022-09-04 MED ORDER — LIDOCAINE-EPINEPHRINE (PF) 2 %-1:200000 IJ SOLN
10.0000 mL | Freq: Once | INTRAMUSCULAR | Status: AC
  Administered 2022-09-04: 10 mL
  Filled 2022-09-04: qty 20

## 2022-09-04 MED ORDER — ACETAMINOPHEN 325 MG PO TABS
650.0000 mg | ORAL_TABLET | Freq: Four times a day (QID) | ORAL | Status: DC | PRN
  Administered 2022-09-05: 650 mg via ORAL
  Filled 2022-09-04: qty 2

## 2022-09-04 MED ORDER — ACETAMINOPHEN 650 MG RE SUPP: 650.0000 mg | Freq: Four times a day (QID) | RECTAL | Status: DC | PRN

## 2022-09-04 MED ORDER — POTASSIUM CHLORIDE 10 MEQ/100ML IV SOLN
10.0000 meq | INTRAVENOUS | Status: AC
  Administered 2022-09-04 – 2022-09-05 (×3): 10 meq via INTRAVENOUS
  Filled 2022-09-04 (×3): qty 100

## 2022-09-04 MED ORDER — SODIUM CHLORIDE 0.9 % IV BOLUS
500.0000 mL | Freq: Once | INTRAVENOUS | Status: AC
  Administered 2022-09-04: 500 mL via INTRAVENOUS

## 2022-09-04 MED ORDER — SODIUM CHLORIDE 0.9% FLUSH
3.0000 mL | Freq: Two times a day (BID) | INTRAVENOUS | Status: DC
  Administered 2022-09-04 – 2022-09-05 (×2): 3 mL via INTRAVENOUS
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
## 2022-09-04 NOTE — Assessment & Plan Note (Signed)
Hypovolemic.  Improving with fluids.  Low risk for ODS - Continue IV fluids

## 2022-09-04 NOTE — Assessment & Plan Note (Signed)
Etiology under workup, check abdominal x-ray, at this time seems to have resolved clinically.  Also check right upper quadrant ultrasound given associated with right upper quadrant cramping

## 2022-09-04 NOTE — Assessment & Plan Note (Signed)
Slight hypotension apparent in ER vitals. Check troponin. Echo.

## 2022-09-04 NOTE — ED Triage Notes (Signed)
PT is coming from home following a syncopal episode. Pt fell forward hitting his head on the floor. PT is taking serrapeptasc which he states thins his blood. PT also states that he has been weak, and feeling nauseous for the last week, does endorse several episodes of emesis. A&O 4x at this time.

## 2022-09-04 NOTE — Assessment & Plan Note (Signed)
Check blood cultures.  Patient is not having diarrhea.

## 2022-09-04 NOTE — Assessment & Plan Note (Signed)
Hold keppra as patient not taking at home.

## 2022-09-04 NOTE — Assessment & Plan Note (Addendum)
Sutured in the ER

## 2022-09-04 NOTE — Assessment & Plan Note (Signed)
Magnesium and lower extremity Dopplers

## 2022-09-04 NOTE — Assessment & Plan Note (Signed)
There is severe.  Check ultrasound of the kidney, urinalysis urine sodium and creatinine.  Monitor response to IV fluid empirically as ordered.  Patient will need strict intake and output, I will place Foley.

## 2022-09-04 NOTE — Assessment & Plan Note (Signed)
Reportedly in right upper quadrant, will start with ultrasound and proceed accordingly.  Has been going on for 6 months.

## 2022-09-04 NOTE — Assessment & Plan Note (Signed)
Patient describes orthostatic symptoms preceding syncope.  Will check orthostatics, maintain on telemetry.monitor blood pressure
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
## 2022-09-05 ENCOUNTER — Encounter (HOSPITAL_COMMUNITY): Payer: Self-pay | Admitting: Internal Medicine

## 2022-09-05 DIAGNOSIS — N179 Acute kidney failure, unspecified: Secondary | ICD-10-CM | POA: Diagnosis not present

## 2022-09-05 DIAGNOSIS — Z87898 Personal history of other specified conditions: Secondary | ICD-10-CM

## 2022-09-05 LAB — CBC
HCT: 30.2 % — ABNORMAL LOW (ref 39.0–52.0)
Hemoglobin: 10.7 g/dL — ABNORMAL LOW (ref 13.0–17.0)
MCH: 28.8 pg (ref 26.0–34.0)
MCHC: 35.4 g/dL (ref 30.0–36.0)
MCV: 81.4 fL (ref 80.0–100.0)
Platelets: 241 10*3/uL (ref 150–400)
RBC: 3.71 MIL/uL — ABNORMAL LOW (ref 4.22–5.81)
RDW: 13.4 % (ref 11.5–15.5)
WBC: 8.7 10*3/uL (ref 4.0–10.5)
nRBC: 0 % (ref 0.0–0.2)

## 2022-09-05 LAB — HEPATIC FUNCTION PANEL
ALT: 35 U/L (ref 0–44)
AST: 20 U/L (ref 15–41)
Albumin: 3.5 g/dL (ref 3.5–5.0)
Alkaline Phosphatase: 56 U/L (ref 38–126)
Bilirubin, Direct: 0.2 mg/dL (ref 0.0–0.2)
Indirect Bilirubin: 1.1 mg/dL — ABNORMAL HIGH (ref 0.3–0.9)
Total Bilirubin: 1.3 mg/dL — ABNORMAL HIGH (ref 0.3–1.2)
Total Protein: 5.9 g/dL — ABNORMAL LOW (ref 6.5–8.1)

## 2022-09-05 LAB — BASIC METABOLIC PANEL
Anion gap: 11 (ref 5–15)
Anion gap: 12 (ref 5–15)
Anion gap: 8 (ref 5–15)
Anion gap: 9 (ref 5–15)
BUN: 69 mg/dL — ABNORMAL HIGH (ref 6–20)
BUN: 60 mg/dL — ABNORMAL HIGH (ref 6–20)
BUN: 55 mg/dL — ABNORMAL HIGH (ref 6–20)
BUN: 33 mg/dL — ABNORMAL HIGH (ref 6–20)
CO2: 27 mmol/L (ref 22–32)
CO2: 21 mmol/L — ABNORMAL LOW (ref 22–32)
CO2: 22 mmol/L (ref 22–32)
CO2: 26 mmol/L (ref 22–32)
Calcium: 8.9 mg/dL (ref 8.9–10.3)
Calcium: 8.5 mg/dL — ABNORMAL LOW (ref 8.9–10.3)
Calcium: 8.1 mg/dL — ABNORMAL LOW (ref 8.9–10.3)
Calcium: 8.7 mg/dL — ABNORMAL LOW (ref 8.9–10.3)
Chloride: 88 mmol/L — ABNORMAL LOW (ref 98–111)
Chloride: 94 mmol/L — ABNORMAL LOW (ref 98–111)
Chloride: 94 mmol/L — ABNORMAL LOW (ref 98–111)
Chloride: 97 mmol/L — ABNORMAL LOW (ref 98–111)
Creatinine, Ser: 3.08 mg/dL — ABNORMAL HIGH (ref 0.61–1.24)
Creatinine, Ser: 2.49 mg/dL — ABNORMAL HIGH (ref 0.61–1.24)
Creatinine, Ser: 1.9 mg/dL — ABNORMAL HIGH (ref 0.61–1.24)
Creatinine, Ser: 1.09 mg/dL (ref 0.61–1.24)
GFR, Estimated: 24 mL/min — ABNORMAL LOW (ref >60)
GFR, Estimated: 31 mL/min — ABNORMAL LOW (ref >60)
GFR, Estimated: 42 mL/min — ABNORMAL LOW (ref >60)
GFR, Estimated: >60 mL/min (ref >60)
Glucose, Bld: 106 mg/dL — ABNORMAL HIGH (ref 70–99)
Glucose, Bld: 103 mg/dL — ABNORMAL HIGH (ref 70–99)
Glucose, Bld: 99 mg/dL (ref 70–99)
Glucose, Bld: 72 mg/dL (ref 70–99)
Potassium: 2.9 mmol/L — ABNORMAL LOW (ref 3.5–5.1)
Potassium: 3.2 mmol/L — ABNORMAL LOW (ref 3.5–5.1)
Potassium: 3.1 mmol/L — ABNORMAL LOW (ref 3.5–5.1)
Potassium: 3.7 mmol/L (ref 3.5–5.1)
Sodium: 126 mmol/L — ABNORMAL LOW (ref 135–145)
Sodium: 127 mmol/L — ABNORMAL LOW (ref 135–145)
Sodium: 124 mmol/L — ABNORMAL LOW (ref 135–145)
Sodium: 132 mmol/L — ABNORMAL LOW (ref 135–145)

## 2022-09-05 LAB — URINALYSIS, ROUTINE W REFLEX MICROSCOPIC
Bacteria, UA: NONE SEEN
Bilirubin Urine: NEGATIVE
Glucose, UA: NEGATIVE mg/dL
Ketones, ur: NEGATIVE mg/dL
Leukocytes,Ua: NEGATIVE
Nitrite: NEGATIVE
Protein, ur: NEGATIVE mg/dL
Specific Gravity, Urine: 1.008 (ref 1.005–1.030)
pH: 5 (ref 5.0–8.0)

## 2022-09-05 LAB — MAGNESIUM: Magnesium: 2.2 mg/dL (ref 1.7–2.4)

## 2022-09-05 LAB — APTT: aPTT: 22 seconds — ABNORMAL LOW (ref 24–36)

## 2022-09-05 LAB — MRSA NEXT GEN BY PCR, NASAL: MRSA by PCR Next Gen: DETECTED — AB

## 2022-09-05 LAB — TSH: TSH: 0.399 u[IU]/mL (ref 0.350–4.500)

## 2022-09-05 LAB — CK: Total CK: 283 U/L (ref 49–397)

## 2022-09-05 LAB — PROTIME-INR
INR: 1.1 (ref 0.8–1.2)
Prothrombin Time: 14.2 seconds (ref 11.4–15.2)

## 2022-09-05 LAB — CREATININE, URINE, RANDOM: Creatinine, Urine: 86 mg/dL

## 2022-09-05 LAB — PHOSPHORUS: Phosphorus: 4.4 mg/dL (ref 2.5–4.6)

## 2022-09-05 LAB — OSMOLALITY: Osmolality: 301 mOsm/kg — ABNORMAL HIGH (ref 275–295)

## 2022-09-05 LAB — TROPONIN I (HIGH SENSITIVITY): Troponin I (High Sensitivity): 5 ng/L (ref <18)

## 2022-09-05 LAB — NA AND K (SODIUM & POTASSIUM), RAND UR
Potassium Urine: 16 mmol/L
Sodium, Ur: 17 mmol/L

## 2022-09-05 LAB — HIV ANTIBODY (ROUTINE TESTING W REFLEX): HIV Screen 4th Generation wRfx: NONREACTIVE

## 2022-09-05 MED ORDER — MUPIROCIN 2 % EX OINT
1.0000 | TOPICAL_OINTMENT | Freq: Two times a day (BID) | CUTANEOUS | Status: DC
  Administered 2022-09-05 – 2022-09-06 (×3): 1 via NASAL
  Filled 2022-09-05: qty 22

## 2022-09-05 MED ORDER — CHLORHEXIDINE GLUCONATE CLOTH 2 % EX PADS
6.0000 | MEDICATED_PAD | Freq: Every day | CUTANEOUS | Status: DC
  Administered 2022-09-05 (×2): 6 via TOPICAL

## 2022-09-05 MED ORDER — LACTATED RINGERS IV BOLUS
250.0000 mL | Freq: Once | INTRAVENOUS | Status: AC
  Administered 2022-09-05: 250 mL via INTRAVENOUS

## 2022-09-05 MED ORDER — CHLORHEXIDINE GLUCONATE CLOTH 2 % EX PADS
6.0000 | MEDICATED_PAD | Freq: Every day | CUTANEOUS | Status: DC
  Administered 2022-09-06: 6 via TOPICAL

## 2022-09-05 MED ORDER — LEVETIRACETAM 500 MG PO TABS
500.0000 mg | ORAL_TABLET | Freq: Every day | ORAL | Status: DC
  Administered 2022-09-05 – 2022-09-06 (×2): 500 mg via ORAL
  Filled 2022-09-05 (×2): qty 1

## 2022-09-05 MED ORDER — POTASSIUM CHLORIDE 10 MEQ/100ML IV SOLN
10.0000 meq | INTRAVENOUS | Status: AC
  Administered 2022-09-05 (×2): 10 meq via INTRAVENOUS
  Filled 2022-09-05 (×2): qty 100

## 2022-09-05 MED ORDER — POTASSIUM CHLORIDE 2 MEQ/ML IV SOLN
INTRAVENOUS | Status: DC
  Filled 2022-09-05 (×6): qty 1000

## 2022-09-05 MED ORDER — ORAL CARE MOUTH RINSE: 15.0000 mL | OROMUCOSAL | Status: DC | PRN

## 2022-09-05 NOTE — Assessment & Plan Note (Signed)
-   Hold lisinopril, HCTZ, amlodipine

## 2022-09-05 NOTE — Progress Notes (Signed)
Admitted to ICU 1233 from ED. Alert person, place, month and year, situation. Denies chest pain. Denies nausea. Has sutures above left eye and abrasion to the left of eye and bruising under left eye. He has an abrasion on his left knee. IV 20 G RUA w/ KCL infusing, he is c/o burning so will slow rate and is already running w/ NS. Call light in reach. CHG done. VS WNL. Continuing to monitor.

## 2022-09-05 NOTE — Progress Notes (Addendum)
       CROSS COVER NOTE  Name: Donald Jenkins  DOB: 1970/09/27  CWU:[id number]  DOA: 09/04/2022  Level of care: Stepdown    Date of Service   09/05/2022     HPI/Events of Note   RN reports hypotensive episode.  BP, SBP 80-90s, MAP 55-60.  During admission, patient was also hypotensive due to hypovolemic state.  Currently asymptomatic.  All other vital stable.  Will add small bolus.   Interventions/ Plan   Bolus-250 cc        Raenette Rover, DNP, [place]

## 2022-09-05 NOTE — Assessment & Plan Note (Signed)
-   Continue home Keppra 500 daily

## 2022-09-05 NOTE — Hospital Course (Addendum)
52 yo M with HTN, anxiety presented with several days vomiting, then recurrent syncope.  In the ER, creatinine 4.4, Na 119

## 2022-09-05 NOTE — TOC CM/SW Note (Signed)
Transition of Care [facility]) Screening Note  Patient Details  Name: Donald Jenkins Date of Birth: 11-May-1971  Transition of Care [facility]) CM/SW Contact:    Sherie Don, LCSW Phone Number: 09/05/2022, 3:31 PM  Transition of Care Department [facility]) has reviewed patient and no TOC needs have been identified at this time. We will continue to monitor patient advancement through interdisciplinary progression rounds. If new patient transition needs arise, please place a TOC consult.

## 2022-09-05 NOTE — Assessment & Plan Note (Signed)
-   Continue home Xanax as needed 

## 2022-09-05 NOTE — Progress Notes (Signed)
  Progress Note   Patient: Donald Jenkins:[id number] DOB: [birth year] DOA: 09/04/2022     1 DOS: the patient was seen and examined on 09/05/2022 at 8:42 AM      Brief hospital course: 52 yo M with HTN, anxiety presented with several days vomiting, then recurrent syncope.  In the ER, creatinine 4.4, Na 119     Assessment and Plan: * AKI (acute kidney injury) ([place]) Cr up to 4.4 on admission from baseline 1.0.  FeNA 0.5, US renal normal, UA bland, improving with fluids down to 1.9 today. - Continue IV fluids  - Remove foley - Hold ACE inhibitor    Hyponatremia Hypovolemic.  Asymptomatic.  Improving with fluids.  Low risk for ODS, no need for every 4 hours BMP - Continue IV fluids - Trend sodium  Hypotension Due to hypovolemia and anti-hypertensives - Continue IV fluids  History of seizure - Continue home Keppra 500 daily  Laceration of forehead Sutured in the ER  Hypokalemia - Supplement potassium  Anxiety and depression - Continue home Xanax as needed  Essential hypertension, benign - Hold lisinopril, HCTZ, amlodipine        Subjective: Feels well, has some numbness over the left side of his forehead, no seizures, confusion, focal weakness.  No vomiting.     Physical Exam: BP 101/65 (BP Location: Left Arm)   Pulse 83   Temp 98 F (36.7 C) (Oral)   Resp 14   Ht 5\' 11"  (1.803 m)   Wt 81.2 kg   SpO2 100%   BMI 24.97 kg/m   Thin adult male, lying in bed, no acute distress Lacerations evaluated, looks clean dry and intact, healing appropriately RRR, no murmurs, no rubs, no JVD, no lower extremity edema Respiratory rate normal, lungs clear without rales or wheezes Abdomen soft without tenderness palpation or guarding, no ascites or distention Attention normal, affect normal, judgment and insight appear normal, moves all extremities with normal strength and coordination, speech fluent, face symmetric    Data Reviewed: TSH, CK, and troponin  normal Ultrasound of the abdomen normal Urinalysis normal, no casts Potassium 3.2 Renal ultrasound unremarkable CT head, maxilla, and C-spine unremarkable Sodium up to 127 Creatinine down to 1.9         Disposition: Status is: Inpatient Continue IV fluids, monitor renal function and sodium, likely home tomorrow        Author: Edwin Dada, MD 09/05/2022 1:17 PM  For on call review [email or web address].

## 2022-09-05 NOTE — Progress Notes (Signed)
Notified Abigail C. , NP of bp trends and K 3.2 now.

## 2022-09-05 NOTE — Assessment & Plan Note (Signed)
Treated and resolved 

## 2022-09-06 DIAGNOSIS — N179 Acute kidney failure, unspecified: Secondary | ICD-10-CM | POA: Diagnosis not present

## 2022-09-06 LAB — CBC
HCT: 30.1 % — ABNORMAL LOW (ref 39.0–52.0)
Hemoglobin: 10.2 g/dL — ABNORMAL LOW (ref 13.0–17.0)
MCH: 28.7 pg (ref 26.0–34.0)
MCHC: 33.9 g/dL (ref 30.0–36.0)
MCV: 84.6 fL (ref 80.0–100.0)
Platelets: 220 10*3/uL (ref 150–400)
RBC: 3.56 MIL/uL — ABNORMAL LOW (ref 4.22–5.81)
RDW: 13.3 % (ref 11.5–15.5)
WBC: 6.9 10*3/uL (ref 4.0–10.5)
nRBC: 0 % (ref 0.0–0.2)

## 2022-09-06 LAB — BASIC METABOLIC PANEL
Anion gap: 9 (ref 5–15)
BUN: 24 mg/dL — ABNORMAL HIGH (ref 6–20)
CO2: 24 mmol/L (ref 22–32)
Calcium: 8.8 mg/dL — ABNORMAL LOW (ref 8.9–10.3)
Chloride: 99 mmol/L (ref 98–111)
Creatinine, Ser: 1.01 mg/dL (ref 0.61–1.24)
GFR, Estimated: >60 mL/min (ref >60)
Glucose, Bld: 97 mg/dL (ref 70–99)
Potassium: 3.7 mmol/L (ref 3.5–5.1)
Sodium: 132 mmol/L — ABNORMAL LOW (ref 135–145)

## 2022-09-06 NOTE — Discharge Summary (Signed)
Physician Discharge Summary   Patient: Donald Jenkins MRN: [id number] DOB: 05-12-1971  Admit date:     09/04/2022  Discharge date: 09/06/22  Discharge Physician: Edwin Dada   PCP: Charlott Rakes, MD     Recommendations at discharge:  Follow up with PCP Dr. Margarita Rana in 1 week for renal failure Dr. Margarita Rana: Please recheck BMP in 1 week Please check BP and resume antihypertensives as appropriate Please remove sutures from left eyebrow on or around 09/11/22 (per notes, 7 interrupted sutures, and one running suture)     Discharge Diagnoses: Principal Problem:   AKI (acute kidney injury) ([place]) Active Problems:   Hyponatremia   Hypotension   Essential hypertension, benign   Anxiety and depression   Hypokalemia   Laceration of forehead   History of seizure     Hospital Course: 52 yo M with HTN, anxiety presented with several days vomiting, then recurrent syncope.  In the ER, creatinine 4.4, Na 119      * AKI (acute kidney injury) ([place] [address]) The patient provided history consistent with poor oral intake, followed by vomiting.    On admission, he was hypotensive, and had Cr up to 4.4  FeNA 0.5, US renal normal, UA bland.  ACEi held, fluids given and Cr returned to normal.       Hyponatremia Hypovolemic.  Resolved with fluids.  Check Na in 1 week.  Hypotension Due to hypovolemia and anti-hypertensives, resolved with holding home lisinopril, HCTZ and amlodipine.  History of seizure Recommended patient follow up with his Neurologist to confirm that he should or should not be taking this.  Laceration of forehead Sutured in the ER.  Needs suture removal in 1 week.  Essential hypertension, benign Lisinopril, HCTZ, amlodipine held at discharge.            The [facility] Controlled Substances Registry was reviewed for this patient prior to discharge.  Consultants: None Procedures performed: None  Disposition: Home Diet recommendation:   Regular  DISCHARGE MEDICATION: Allergies as of 09/06/2022       Reactions   Penicillins Other (See Comments)   Childhood reaction Did it involve swelling of the face/tongue/throat, SOB, or low BP? Unknown Did it involve sudden or severe rash/hives, skin peeling, or any reaction on the inside of your mouth or nose? Unknown Did you need to seek medical attention at a hospital or doctor's office? Unknown When did it last happen?      childhood If all above answers are "NO", may proceed with cephalosporin use.        Medication List     STOP taking these medications    amLODipine 2.5 MG tablet Commonly known as: NORVASC   lisinopril-hydrochlorothiazide 10-12.5 MG tablet Commonly known as: ZESTORETIC   Naproxen 375 MG Tbec Commonly known as: EC-Naprosyn       TAKE these medications    ALPRAZolam 1 MG tablet Commonly known as: XANAX Take 1 tablet (1 mg total) by mouth 3 (three) times daily as needed for anxiety or sleep (Panic).   doxepin 10 MG capsule Commonly known as: SINEQUAN Take 1 capsule (10 mg total) by mouth at bedtime.   famotidine 20 MG tablet Commonly known as: PEPCID Take 1 tablet (20 mg total) by mouth 2 (two) times daily.   levETIRAcetam 500 MG tablet Commonly known as: KEPPRA Take 1 tablet (500 mg total) by mouth in the morning.   magnesium oxide 400 MG tablet Commonly known as: MAG-OX Take 400 mg by mouth daily.  OVER THE COUNTER MEDICATION Take 2 capsules by mouth daily. Serrapeptase 240 000 iu   pantoprazole 40 MG tablet Commonly known as: PROTONIX Take 1 tablet (40 mg total) by mouth daily.   PARoxetine 20 MG tablet Commonly known as: Paxil Take 1.5 tablets (30 mg total) by mouth daily. What changed: how much to take   temazepam 30 MG capsule Commonly known as: RESTORIL Take 1 capsule (30 mg total) by mouth at bedtime.   VITAMIN D-VITAMIN K PO Take 1 tablet by mouth in the morning.               Discharge Care  Instructions  (From admission, onward)           Start     Ordered   09/06/22 0000  Discharge wound care:       Comments: You may wash with soap and water, pat dry  Remove sutures in 1 week   09/06/22 1158            Follow-up Information     Charlott Rakes, MD. Schedule an appointment as soon as possible for a visit in 1 week(s).   Specialty: Family Medicine Contact information: [place] [address] [phone]                 Discharge Instructions     Discharge instructions   Complete by: As directed    **IMPORTANT DISCHARGE INSTRUCTIONS**   From Dr. Loleta Books: You were admitted for acute kidney injury, also known as acute renal failure  This was likely caused by low blood pressure, which was likely the result of getting dehydrated in the setting of taking your three blood pressure medicines  Here, we held your blood pressure medicines and gave you IV fluids and your kidney function returned to normal You had an ultrasound of the kidneys that was unremarkable  You should NOT take lisinopril, HCTZ or amlodipine this week Go see your primary care doctor in 1 week and ask her if you should restart  Resume your other home medicines  Remember that NSAIDs (like Naproxen/Aleve) can be harmful to the kidneys if taken in excess  Also remember if you have an illness where you are vomiting a lot and can't keep anything down to hold your lisinopril, because it can make kidney injury worse   Discharge wound care:   Complete by: As directed    You may wash with soap and water, pat dry  Remove sutures in 1 week   Increase activity slowly   Complete by: As directed        Discharge Exam: Filed Weights   09/04/22 1643  Weight: 81.2 kg    General:  Pt is alert, awake, not in acute distress Cardiovascular: RRR, nl S1-S2, no murmurs appreciated.   No LE edema.   Respiratory: Normal respiratory rate and rhythm.  CTAB without  rales or wheezes. Abdominal: Abdomen soft and non-tender.  No distension or HSM.   Neuro/Psych: Strength symmetric in upper and lower extremities.  Judgment and insight appear normal .   Condition at discharge: good  The results of significant diagnostics from this hospitalization (including imaging, microbiology, ancillary and laboratory) are listed below for reference.   Imaging Studies: US Abdomen Limited RUQ (LIVER/GB)  Result Date: 09/04/2022 CLINICAL DATA:  Right upper quadrant abdominal pain. Cholecystectomy. Cramping on right side with movement for 3 months EXAM: ULTRASOUND ABDOMEN LIMITED RIGHT UPPER QUADRANT COMPARISON:  None Available. FINDINGS: Gallbladder:  Status post cholecystectomy. Common bile duct: Diameter: 5 mm.  No intrahepatic biliary ductal dilatation. Liver: No focal lesion identified. Echogenic hepatic parenchyma concerning for hepatic steatosis. Portal vein is patent on color Doppler imaging with normal direction of blood flow towards the liver. Other: None. IMPRESSION: 1. Hepatic steatosis. 2. Status post cholecystectomy.  No biliary ductal dilatation. 3. No acute abdominal process. Electronically Signed   By: Keane Police D.O.   On: 09/04/2022 22:25   US Renal  Result Date: 09/04/2022 CLINICAL DATA:  Acute renal insufficiency. EXAM: RENAL / URINARY TRACT ULTRASOUND COMPLETE COMPARISON:  CT abdomen pelvis dated 06/05/2018. FINDINGS: Right Kidney: Renal measurements: 11.5 x 4.8 x 6.0 cm = volume: 174 mL. Mild increased echogenicity. No hydronephrosis or shadowing stone. Left Kidney: Renal measurements: 11.5 x 6.4 x 5.5 cm = volume: 212 mL. Mild increased echogenicity. No hydronephrosis or shadowing stone. Bladder: Appears normal for degree of bladder distention. Other: None. IMPRESSION: Mildly echogenic kidneys may represent medical renal disease. No hydronephrosis or shadowing stone. Electronically Signed   By: Anner Crete M.D.   On: 09/04/2022 22:22   DG Abd 2  Views  Result Date: 09/04/2022 CLINICAL DATA:  Vomiting, syncope. EXAM: ABDOMEN - 2 VIEW COMPARISON:  None Available. FINDINGS: The bowel gas pattern is normal. There is no evidence of free air. Cholecystectomy clips are present. No radio-opaque calculi or other significant radiographic abnormality is seen. IMPRESSION: Negative. Electronically Signed   By: Ronney Asters M.D.   On: 09/04/2022 22:10   CT HEAD WO CONTRAST (5MM)  Result Date: 09/04/2022 CLINICAL DATA:  Head trauma, moderate-severe; Facial trauma, blunt; Neck trauma, dangerous injury mechanism (Age 43-64y) EXAM: CT HEAD WITHOUT CONTRAST CT MAXILLOFACIAL WITHOUT CONTRAST CT CERVICAL SPINE WITHOUT CONTRAST TECHNIQUE: Multidetector CT imaging of the head, cervical spine, and maxillofacial structures were performed using the standard protocol without intravenous contrast. Multiplanar CT image reconstructions of the cervical spine and maxillofacial structures were also generated. RADIATION DOSE REDUCTION: This exam was performed according to the departmental dose-optimization program which includes automated exposure control, adjustment of the mA and/or kV according to patient size and/or use of iterative reconstruction technique. COMPARISON:  CT head and C-spine 11/27/2021 FINDINGS: CT HEAD FINDINGS Brain: No evidence of large-territorial acute infarction. No parenchymal hemorrhage. No mass lesion. No extra-axial collection. No mass effect or midline shift. No hydrocephalus. Basilar cisterns are patent. Vascular: No hyperdense vessel. Skull: No acute fracture or focal lesion. Other: Left frontal scalp soft tissue defect with associated mild subcutaneus soft tissue hematoma and foci of gas. CT MAXILLOFACIAL FINDINGS Osseous: No fracture or mandibular dislocation. No destructive process. Sinuses/Orbits: Paranasal sinuses and mastoid air cells are clear. The orbits are unremarkable. Soft tissues: Negative. Other: Periapical lucency surrounding the left  maxillary molars. CT CERVICAL SPINE FINDINGS Alignment: Normal. Skull base and vertebrae: Multilevel mild degenerative changes of the spine. No associated severe osseous neural foraminal or central canal stenosis. Left C4 pedicle densely sclerotic osseous lesion consistent with a bone island. No acute fracture. No aggressive appearing focal osseous lesion or focal pathologic process. Soft tissues and spinal canal: No prevertebral fluid or swelling. No visible canal hematoma. Upper chest: Unremarkable. Other: Mild atherosclerotic plaque. Partially visualized right clavicular plate and screw fixation. IMPRESSION: 1. No acute intracranial abnormality. 2. No acute displaced fracture. 3. No acute displaced fracture or traumatic listhesis of the cervical spine. Electronically Signed   By: Iven Finn M.D.   On: 09/04/2022 20:36   CT Maxillofacial Wo Contrast  Result Date:  09/04/2022 CLINICAL DATA:  Head trauma, moderate-severe; Facial trauma, blunt; Neck trauma, dangerous injury mechanism (Age 59-64y) EXAM: CT HEAD WITHOUT CONTRAST CT MAXILLOFACIAL WITHOUT CONTRAST CT CERVICAL SPINE WITHOUT CONTRAST TECHNIQUE: Multidetector CT imaging of the head, cervical spine, and maxillofacial structures were performed using the standard protocol without intravenous contrast. Multiplanar CT image reconstructions of the cervical spine and maxillofacial structures were also generated. RADIATION DOSE REDUCTION: This exam was performed according to the departmental dose-optimization program which includes automated exposure control, adjustment of the mA and/or kV according to patient size and/or use of iterative reconstruction technique. COMPARISON:  CT head and C-spine 11/27/2021 FINDINGS: CT HEAD FINDINGS Brain: No evidence of large-territorial acute infarction. No parenchymal hemorrhage. No mass lesion. No extra-axial collection. No mass effect or midline shift. No hydrocephalus. Basilar cisterns are patent. Vascular: No  hyperdense vessel. Skull: No acute fracture or focal lesion. Other: Left frontal scalp soft tissue defect with associated mild subcutaneus soft tissue hematoma and foci of gas. CT MAXILLOFACIAL FINDINGS Osseous: No fracture or mandibular dislocation. No destructive process. Sinuses/Orbits: Paranasal sinuses and mastoid air cells are clear. The orbits are unremarkable. Soft tissues: Negative. Other: Periapical lucency surrounding the left maxillary molars. CT CERVICAL SPINE FINDINGS Alignment: Normal. Skull base and vertebrae: Multilevel mild degenerative changes of the spine. No associated severe osseous neural foraminal or central canal stenosis. Left C4 pedicle densely sclerotic osseous lesion consistent with a bone island. No acute fracture. No aggressive appearing focal osseous lesion or focal pathologic process. Soft tissues and spinal canal: No prevertebral fluid or swelling. No visible canal hematoma. Upper chest: Unremarkable. Other: Mild atherosclerotic plaque. Partially visualized right clavicular plate and screw fixation. IMPRESSION: 1. No acute intracranial abnormality. 2. No acute displaced fracture. 3. No acute displaced fracture or traumatic listhesis of the cervical spine. Electronically Signed   By: Iven Finn M.D.   On: 09/04/2022 20:36   CT Cervical Spine Wo Contrast  Result Date: 09/04/2022 CLINICAL DATA:  Head trauma, moderate-severe; Facial trauma, blunt; Neck trauma, dangerous injury mechanism (Age 110-64y) EXAM: CT HEAD WITHOUT CONTRAST CT MAXILLOFACIAL WITHOUT CONTRAST CT CERVICAL SPINE WITHOUT CONTRAST TECHNIQUE: Multidetector CT imaging of the head, cervical spine, and maxillofacial structures were performed using the standard protocol without intravenous contrast. Multiplanar CT image reconstructions of the cervical spine and maxillofacial structures were also generated. RADIATION DOSE REDUCTION: This exam was performed according to the departmental dose-optimization program which  includes automated exposure control, adjustment of the mA and/or kV according to patient size and/or use of iterative reconstruction technique. COMPARISON:  CT head and C-spine 11/27/2021 FINDINGS: CT HEAD FINDINGS Brain: No evidence of large-territorial acute infarction. No parenchymal hemorrhage. No mass lesion. No extra-axial collection. No mass effect or midline shift. No hydrocephalus. Basilar cisterns are patent. Vascular: No hyperdense vessel. Skull: No acute fracture or focal lesion. Other: Left frontal scalp soft tissue defect with associated mild subcutaneus soft tissue hematoma and foci of gas. CT MAXILLOFACIAL FINDINGS Osseous: No fracture or mandibular dislocation. No destructive process. Sinuses/Orbits: Paranasal sinuses and mastoid air cells are clear. The orbits are unremarkable. Soft tissues: Negative. Other: Periapical lucency surrounding the left maxillary molars. CT CERVICAL SPINE FINDINGS Alignment: Normal. Skull base and vertebrae: Multilevel mild degenerative changes of the spine. No associated severe osseous neural foraminal or central canal stenosis. Left C4 pedicle densely sclerotic osseous lesion consistent with a bone island. No acute fracture. No aggressive appearing focal osseous lesion or focal pathologic process. Soft tissues and spinal canal: No prevertebral fluid or swelling.  No visible canal hematoma. Upper chest: Unremarkable. Other: Mild atherosclerotic plaque. Partially visualized right clavicular plate and screw fixation. IMPRESSION: 1. No acute intracranial abnormality. 2. No acute displaced fracture. 3. No acute displaced fracture or traumatic listhesis of the cervical spine. Electronically Signed   By: Iven Finn M.D.   On: 09/04/2022 20:36   DG Chest Portable 1 View  Result Date: 09/04/2022 CLINICAL DATA:  Weakness. Syncopal episode. EXAM: PORTABLE CHEST 1 VIEW COMPARISON:  02/12/2014 FINDINGS: The cardiomediastinal contours are normal. The lungs are clear.  Pulmonary vasculature is normal. No consolidation, pleural effusion, or pneumothorax. Postsurgical change of the right clavicle. Chronic midthoracic compression deformity, suboptimally assessed on frontal views. IMPRESSION: 1. No acute chest findings. 2. Chronic midthoracic compression deformity. Electronically Signed   By: Keith Rake M.D.   On: 09/04/2022 17:34    Microbiology: Results for orders placed or performed during the hospital encounter of 09/04/22  Culture, blood (Routine X 2) w Reflex to ID Panel     Status: None (Preliminary result)   Collection Time: 09/05/22  2:04 AM   Specimen: BLOOD  Result Value Ref Range Status   Specimen Description   Final    BLOOD BLOOD LEFT ARM Performed at [facility] [address], [place], [place] [address]    Special Requests   Final    BOTTLES DRAWN AEROBIC AND ANAEROBIC Blood Culture adequate volume Performed at [facility] [address], [place], [place] [address]    Culture   Final    NO GROWTH 1 DAY Performed at [facility], [place] [address] [place], [place] [address]    Report Status PENDING  Incomplete  Culture, blood (Routine X 2) w Reflex to ID Panel     Status: None (Preliminary result)   Collection Time: 09/05/22  2:06 AM   Specimen: BLOOD  Result Value Ref Range Status   Specimen Description   Final    BLOOD BLOOD LEFT HAND Performed at [facility] [address] [place], [place] [address]    Special Requests   Final    BOTTLES DRAWN AEROBIC AND ANAEROBIC Blood Culture results may not be optimal due to an inadequate volume of blood received in culture bottles Performed at [facility] [address], [place], [place] [address]    Culture   Final    NO GROWTH 1 DAY Performed at [facility], [place] [address], [place], [place] [address]    Report Status PENDING  Incomplete  MRSA Next Gen by PCR, Nasal     Status: Abnormal    Collection Time: 09/05/22  2:15 AM   Specimen: Nasal Mucosa; Nasal Swab  Result Value Ref Range Status   MRSA by PCR Next Gen DETECTED (A) NOT DETECTED Final    Comment: RESULT CALLED TO, READ BACK BY AND VERIFIED WITH: SHAW,C. 09/05/22 '@0506'$  BY SEEL,M. (NOTE) The GeneXpert MRSA Assay (FDA approved for NASAL specimens only), is one component of a comprehensive MRSA colonization surveillance program. It is not intended to diagnose MRSA infection nor to guide or monitor treatment for MRSA infections. Test performance is not FDA approved in patients less than 65 years old. Performed at [facility], [place] [address], [place], [place] [address]     Labs: CBC: Recent Labs  Lab 09/04/22 1709 09/04/22 2314 09/05/22 0542 09/06/22 0521  WBC 16.6* 10.9* 8.7 6.9  NEUTROABS 13.7*  --   --   --  HGB 14.5 13.5 10.7* 10.2*  HCT 40.0 38.2* 30.2* 30.1*  MCV 79.7* 80.6 81.4 84.6  PLT 335 317 241 [id number]   Basic Metabolic Panel: Recent Labs  Lab 09/04/22 2314 09/05/22 0206 09/05/22 0542 09/05/22 1701 09/06/22 0521  NA 126* 127* 124* 132* 132*  K 2.9* 3.2* 3.1* 3.7 3.7  CL 88* 94* 94* 97* 99  CO2 27 21* '22 26 24  '$ GLUCOSE 106* 103* 99 72 97  BUN 69* 60* 55* 33* 24*  CREATININE 3.08* 2.49* 1.90* 1.09 1.01  CALCIUM 8.9 8.5* 8.1* 8.7* 8.8*  MG 2.2  --   --   --   --   PHOS 4.4  --   --   --   --    Liver Function Tests: Recent Labs  Lab 09/04/22 1709 09/05/22 0542  AST 23 20  ALT 56* 35  ALKPHOS 81 56  BILITOT 1.6* 1.3*  PROT 8.1 5.9*  ALBUMIN 4.9 3.5   CBG: No results for input(s): "GLUCAP" in the last 168 hours.  Discharge time spent: approximately 35 minutes spent on discharge counseling, evaluation of patient on day of discharge, and coordination of discharge planning with nursing, social work, pharmacy and case management  Signed: Edwin Dada, MD [facility] 09/06/2022

## 2022-09-07 ENCOUNTER — Telehealth: Payer: Self-pay

## 2022-09-07 LAB — LEVETIRACETAM LEVEL: Levetiracetam Lvl: <2 ug/mL — ABNORMAL LOW (ref 10.0–40.0)

## 2022-09-07 NOTE — Transitions of Care (Post Inpatient/ED Visit) (Signed)
   09/07/2022  Name: Donald Jenkins MRN: [id number] DOB: Jul 10, 1970  Today's TOC FU Call Status: Today's TOC FU Call Status:: Unsuccessul Call (1st Attempt) Unsuccessful Call (1st Attempt) Date: 09/07/22  Attempted to reach the patient regarding the most recent Inpatient/ED visit. Message left for patient with call back requested.   Follow Up Plan: The patient has an appointment at [facility] with Dr Margarita Rana tomorrow, 09/08/2022.   Signature   Eden Lathe, RN

## 2022-09-08 ENCOUNTER — Encounter: Payer: Self-pay | Admitting: Family Medicine

## 2022-09-08 ENCOUNTER — Ambulatory Visit: Payer: Medicaid Other | Attending: Family Medicine | Admitting: Family Medicine

## 2022-09-08 VITALS — BP 104/64 | HR 84 | Temp 98.0°F | Ht 71.0 in | Wt 191.2 lb

## 2022-09-08 DIAGNOSIS — S01112D Laceration without foreign body of left eyelid and periocular area, subsequent encounter: Secondary | ICD-10-CM | POA: Diagnosis not present

## 2022-09-08 DIAGNOSIS — I9589 Other hypotension: Secondary | ICD-10-CM

## 2022-09-08 DIAGNOSIS — E861 Hypovolemia: Secondary | ICD-10-CM

## 2022-09-08 DIAGNOSIS — M25562 Pain in left knee: Secondary | ICD-10-CM | POA: Diagnosis not present

## 2022-09-08 MED ORDER — MISC. DEVICES MISC: 0 refills | Status: AC
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
## 2022-09-08 NOTE — Patient Instructions (Signed)
Hypotension As your heart beats, it forces blood through your body. This force is called blood pressure. If you have hypotension, you have low blood pressure.  When your blood pressure is too low, you may not get enough blood to your brain or other parts of your body. This may cause you to feel weak, light-headed, have a fast heartbeat, or even faint. Low blood pressure may be harmless, or it may cause serious problems. What are the causes? Blood loss. Not enough water in the body (dehydration). Heart problems. Hormone problems. Pregnancy. A very bad infection. Not having enough of certain nutrients. Very bad allergic reactions. Certain medicines. What increases the risk? Age. The risk increases as you get older. Conditions that affect the heart or the brain and spinal cord (central nervous system). What are the signs or symptoms? Feeling: Weak. Light-headed. Dizzy. Tired (fatigued). Blurred vision. Fast heartbeat. Fainting, in very bad cases. How is this treated? Changing your diet. This may involve drinking more water or including more salt (sodium) in your diet by eating high-salt foods. Taking medicines to raise your blood pressure. Changing how much you take (the dosage) of some of your medicines. Wearing compression stockings. These stockings help to prevent blood clots and reduce swelling in your legs. In some cases, you may need to go to the hospital to: Receive fluids through an IV tube. Receive donated blood through an IV tube (transfusion). Get treated for an infection or heart problems, if this applies. Be monitored while medicines that you are taking wear off. Follow these instructions at home: Eating and drinking  Drink enough fluids to keep your pee (urine) pale yellow. Eat a healthy diet. Follow instructions from your doctor about what you can eat or drink. A healthy diet includes: Fresh fruits and vegetables. Whole grains. Low-fat (lean) meats. Low-fat  dairy products. If told, include more salt in your diet. Do not add extra salt to your diet unless your doctor tells you to. Eat small meals often. Avoid standing up quickly after you eat. Medicines Take over-the-counter and prescription medicines only as told by your doctor. Follow instructions from your doctor about changing how much you take of your medicines, if this applies. Do not stop or change any of your medicines on your own. General instructions  Wear compression stockings as told by your doctor. Get up slowly from lying down or sitting. Avoid hot showers and a lot of heat as told by your doctor. Return to your normal activities when your doctor says that it is safe. Do not smoke or use any products that contain nicotine or tobacco. If you need help quitting, ask your doctor. Keep all follow-up visits. Contact a doctor if: You vomit. You have watery poop (diarrhea). You have a fever for more than 2-3 days. You feel more thirsty than normal. You feel weak and tired. Get help right away if: You have chest pain. You have a fast or uneven heartbeat. You lose feeling (have numbness) in any part of your body. You cannot move your arms or your legs. You have trouble talking. You get sweaty or feel light-headed. You faint. You have trouble breathing. You have trouble staying awake. You feel mixed up (confused). These symptoms may be an emergency. Get help right away. Call 911. Do not wait to see if the symptoms will go away. Do not drive yourself to the hospital. Summary Hypotension is also called low blood pressure. It is when the force of blood pumping through your body   is too weak. Hypotension may be harmless, or it may cause serious problems. Treatment may include changing your diet and medicines, and wearing compression stockings. In very bad cases, you may need to go to the hospital. This information is not intended to replace advice given to you by your health care  provider. Make sure you discuss any questions you have with your health care provider. Document Revised: 02/03/2021 Document Reviewed: 02/03/2021 Elsevier Patient Education  2023 [employer]  

## 2022-09-10 ENCOUNTER — Encounter: Payer: Self-pay | Admitting: Neurology

## 2022-09-10 ENCOUNTER — Telehealth: Payer: Self-pay | Admitting: Neurology

## 2022-09-10 ENCOUNTER — Ambulatory Visit: Payer: Medicaid Other | Admitting: Neurology

## 2022-09-10 VITALS — BP 127/80 | HR 91 | Ht 72.0 in | Wt 190.5 lb

## 2022-09-10 DIAGNOSIS — G40909 Epilepsy, unspecified, not intractable, without status epilepticus: Secondary | ICD-10-CM | POA: Diagnosis not present

## 2022-09-10 DIAGNOSIS — I10 Essential (primary) hypertension: Secondary | ICD-10-CM | POA: Diagnosis not present

## 2022-09-10 DIAGNOSIS — F419 Anxiety disorder, unspecified: Secondary | ICD-10-CM | POA: Diagnosis not present

## 2022-09-10 LAB — CULTURE, BLOOD (ROUTINE X 2)
Culture: NO GROWTH
Culture: NO GROWTH
Special Requests: ADEQUATE

## 2022-09-10 MED ORDER — CLOBAZAM 10 MG PO TABS: 10.0000 mg | ORAL_TABLET | Freq: Every evening | ORAL | 5 refills | Status: DC

## 2022-09-10 NOTE — Telephone Encounter (Signed)
Thanks

## 2022-09-10 NOTE — Patient Instructions (Addendum)
Discontinue levetiracetam Start clobazam 10 mg nightly Continue your other medications Follow-up in 6 months or sooner if worse
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
## 2022-09-10 NOTE — Telephone Encounter (Signed)
I called [facility]. I spoke with Chermaine. She needs to confirm that the provider is aware that patient is on alprazolam and temazepam with the addition to the new medication clobazam. I advised her that the provider is aware of the alprazolam and temazepam as well.

## 2022-09-10 NOTE — Telephone Encounter (Signed)
Donald Jenkins is calling from [facility]. Stated she needs clarify a prescription for cloBAZam (ONFI) 10 MG tablet. She is requesting a return call

## 2022-09-11 ENCOUNTER — Encounter (HOSPITAL_COMMUNITY): Payer: Self-pay

## 2022-09-11 ENCOUNTER — Other Ambulatory Visit (HOSPITAL_COMMUNITY): Payer: Self-pay

## 2022-09-15 ENCOUNTER — Ambulatory Visit: Payer: Medicaid Other | Attending: Family Medicine | Admitting: Family Medicine

## 2022-09-15 ENCOUNTER — Encounter: Payer: Self-pay | Admitting: Family Medicine

## 2022-09-15 ENCOUNTER — Other Ambulatory Visit: Payer: Self-pay

## 2022-09-15 VITALS — BP 120/73 | HR 84 | Ht 71.0 in

## 2022-09-15 DIAGNOSIS — I9589 Other hypotension: Secondary | ICD-10-CM | POA: Diagnosis not present

## 2022-09-15 DIAGNOSIS — Z4802 Encounter for removal of sutures: Secondary | ICD-10-CM

## 2022-09-15 DIAGNOSIS — E861 Hypovolemia: Secondary | ICD-10-CM | POA: Diagnosis not present
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
## 2022-09-15 NOTE — Patient Instructions (Signed)
Hypotension As your heart beats, it forces blood through your body. This force is called blood pressure. If you have hypotension, you have low blood pressure.  When your blood pressure is too low, you may not get enough blood to your brain or other parts of your body. This may cause you to feel weak, light-headed, have a fast heartbeat, or even faint. Low blood pressure may be harmless, or it may cause serious problems. What are the causes? Blood loss. Not enough water in the body (dehydration). Heart problems. Hormone problems. Pregnancy. A very bad infection. Not having enough of certain nutrients. Very bad allergic reactions. Certain medicines. What increases the risk? Age. The risk increases as you get older. Conditions that affect the heart or the brain and spinal cord (central nervous system). What are the signs or symptoms? Feeling: Weak. Light-headed. Dizzy. Tired (fatigued). Blurred vision. Fast heartbeat. Fainting, in very bad cases. How is this treated? Changing your diet. This may involve drinking more water or including more salt (sodium) in your diet by eating high-salt foods. Taking medicines to raise your blood pressure. Changing how much you take (the dosage) of some of your medicines. Wearing compression stockings. These stockings help to prevent blood clots and reduce swelling in your legs. In some cases, you may need to go to the hospital to: Receive fluids through an IV tube. Receive donated blood through an IV tube (transfusion). Get treated for an infection or heart problems, if this applies. Be monitored while medicines that you are taking wear off. Follow these instructions at home: Eating and drinking  Drink enough fluids to keep your pee (urine) pale yellow. Eat a healthy diet. Follow instructions from your doctor about what you can eat or drink. A healthy diet includes: Fresh fruits and vegetables. Whole grains. Low-fat (lean) meats. Low-fat  dairy products. If told, include more salt in your diet. Do not add extra salt to your diet unless your doctor tells you to. Eat small meals often. Avoid standing up quickly after you eat. Medicines Take over-the-counter and prescription medicines only as told by your doctor. Follow instructions from your doctor about changing how much you take of your medicines, if this applies. Do not stop or change any of your medicines on your own. General instructions  Wear compression stockings as told by your doctor. Get up slowly from lying down or sitting. Avoid hot showers and a lot of heat as told by your doctor. Return to your normal activities when your doctor says that it is safe. Do not smoke or use any products that contain nicotine or tobacco. If you need help quitting, ask your doctor. Keep all follow-up visits. Contact a doctor if: You vomit. You have watery poop (diarrhea). You have a fever for more than 2-3 days. You feel more thirsty than normal. You feel weak and tired. Get help right away if: You have chest pain. You have a fast or uneven heartbeat. You lose feeling (have numbness) in any part of your body. You cannot move your arms or your legs. You have trouble talking. You get sweaty or feel light-headed. You faint. You have trouble breathing. You have trouble staying awake. You feel mixed up (confused). These symptoms may be an emergency. Get help right away. Call 911. Do not wait to see if the symptoms will go away. Do not drive yourself to the hospital. Summary Hypotension is also called low blood pressure. It is when the force of blood pumping through your body   is too weak. Hypotension may be harmless, or it may cause serious problems. Treatment may include changing your diet and medicines, and wearing compression stockings. In very bad cases, you may need to go to the hospital. This information is not intended to replace advice given to you by your health care  provider. Make sure you discuss any questions you have with your health care provider. Document Revised: 02/03/2021 Document Reviewed: 02/03/2021 Elsevier Patient Education  2023 [employer]  

## 2022-09-17 ENCOUNTER — Other Ambulatory Visit: Payer: Self-pay

## 2022-10-27 ENCOUNTER — Ambulatory Visit: Payer: Medicaid Other | Attending: Family Medicine | Admitting: Family Medicine

## 2022-10-27 ENCOUNTER — Encounter: Payer: Self-pay | Admitting: Family Medicine

## 2022-10-27 VITALS — BP 106/76 | HR 96 | Ht 71.0 in | Wt 187.8 lb

## 2022-10-27 DIAGNOSIS — I10 Essential (primary) hypertension: Secondary | ICD-10-CM

## 2022-10-27 DIAGNOSIS — Z1211 Encounter for screening for malignant neoplasm of colon: Secondary | ICD-10-CM | POA: Diagnosis not present

## 2022-10-27 DIAGNOSIS — E861 Hypovolemia: Secondary | ICD-10-CM
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
## 2022-10-27 NOTE — Patient Instructions (Signed)
Hypotension As your heart beats, it forces blood through your body. This force is called blood pressure. If you have hypotension, you have low blood pressure.  When your blood pressure is too low, you may not get enough blood to your brain or other parts of your body. This may cause you to feel weak, light-headed, have a fast heartbeat, or even faint. Low blood pressure may be harmless, or it may cause serious problems. What are the causes? Blood loss. Not enough water in the body (dehydration). Heart problems. Hormone problems. Pregnancy. A very bad infection. Not having enough of certain nutrients. Very bad allergic reactions. Certain medicines. What increases the risk? Age. The risk increases as you get older. Conditions that affect the heart or the brain and spinal cord (central nervous system). What are the signs or symptoms? Feeling: Weak. Light-headed. Dizzy. Tired (fatigued). Blurred vision. Fast heartbeat. Fainting, in very bad cases. How is this treated? Changing your diet. This may involve drinking more water or including more salt (sodium) in your diet by eating high-salt foods. Taking medicines to raise your blood pressure. Changing how much you take (the dosage) of some of your medicines. Wearing compression stockings. These stockings help to prevent blood clots and reduce swelling in your legs. In some cases, you may need to go to the hospital to: Receive fluids through an IV tube. Receive donated blood through an IV tube (transfusion). Get treated for an infection or heart problems, if this applies. Be monitored while medicines that you are taking wear off. Follow these instructions at home: Eating and drinking  Drink enough fluids to keep your pee (urine) pale yellow. Eat a healthy diet. Follow instructions from your doctor about what you can eat or drink. A healthy diet includes: Fresh fruits and vegetables. Whole grains. Low-fat (lean) meats. Low-fat  dairy products. If told, include more salt in your diet. Do not add extra salt to your diet unless your doctor tells you to. Eat small meals often. Avoid standing up quickly after you eat. Medicines Take over-the-counter and prescription medicines only as told by your doctor. Follow instructions from your doctor about changing how much you take of your medicines, if this applies. Do not stop or change any of your medicines on your own. General instructions  Wear compression stockings as told by your doctor. Get up slowly from lying down or sitting. Avoid hot showers and a lot of heat as told by your doctor. Return to your normal activities when your doctor says that it is safe. Do not smoke or use any products that contain nicotine or tobacco. If you need help quitting, ask your doctor. Keep all follow-up visits. Contact a doctor if: You vomit. You have watery poop (diarrhea). You have a fever for more than 2-3 days. You feel more thirsty than normal. You feel weak and tired. Get help right away if: You have chest pain. You have a fast or uneven heartbeat. You lose feeling (have numbness) in any part of your body. You cannot move your arms or your legs. You have trouble talking. You get sweaty or feel light-headed. You faint. You have trouble breathing. You have trouble staying awake. You feel mixed up (confused). These symptoms may be an emergency. Get help right away. Call 911. Do not wait to see if the symptoms will go away. Do not drive yourself to the hospital. Summary Hypotension is also called low blood pressure. It is when the force of blood pumping through your body   is too weak. Hypotension may be harmless, or it may cause serious problems. Treatment may include changing your diet and medicines, and wearing compression stockings. In very bad cases, you may need to go to the hospital. This information is not intended to replace advice given to you by your health care  provider. Make sure you discuss any questions you have with your health care provider. Document Revised: 02/03/2021 Document Reviewed: 02/03/2021 Elsevier Patient Education  2023 [employer]  

## 2022-11-18 ENCOUNTER — Other Ambulatory Visit (HOSPITAL_COMMUNITY): Payer: Self-pay

## 2022-11-19 ENCOUNTER — Other Ambulatory Visit: Payer: Self-pay

## 2023-01-05 ENCOUNTER — Other Ambulatory Visit: Payer: Self-pay

## 2023-01-05 ENCOUNTER — Other Ambulatory Visit: Payer: Self-pay | Admitting: Family Medicine

## 2023-01-05 DIAGNOSIS — K219 Gastro-esophageal reflux disease without esophagitis: Secondary | ICD-10-CM

## 2023-01-05 MED ORDER — FAMOTIDINE 20 MG PO TABS
20.0000 mg | ORAL_TABLET | Freq: Two times a day (BID) | ORAL | 1 refills | Status: DC
  Filled 2023-01-05: qty 180, 90d supply, fill #0
  Filled 2023-03-17: qty 180, 90d supply, fill #1

## 2023-01-05 NOTE — Telephone Encounter (Signed)
Requested Prescriptions  Pending Prescriptions Disp Refills   famotidine (PEPCID) 20 MG tablet 180 tablet 1    Sig: Take 1 tablet (20 mg total) by mouth 2 (two) times daily.     Gastroenterology:  H2 Antagonists Passed - 01/05/2023 11:07 AM      Passed - Valid encounter within last 12 months    Recent Outpatient Visits           2 months ago Essential hypertension   [facility] [place], [place], MD   3 months ago Hypotension due to hypovolemia   [facility] Hoy Register, MD   3 months ago Hypotension due to hypovolemia   [facility] Hoy Register, MD   10 months ago Seizure [facility])   [facility] Hoy Register, MD   1 year ago Colon cancer screening   [facility] [place], Marzella Schlein, [place]       Future Appointments             In 3 months Hoy Register, MD [facility]

## 2023-01-06 ENCOUNTER — Other Ambulatory Visit: Payer: Self-pay

## 2023-01-07 ENCOUNTER — Other Ambulatory Visit: Payer: Self-pay

## 2023-01-20 ENCOUNTER — Other Ambulatory Visit: Payer: Self-pay | Admitting: Psychiatry

## 2023-01-20 DIAGNOSIS — F5105 Insomnia due to other mental disorder: Secondary | ICD-10-CM

## 2023-01-24 ENCOUNTER — Telehealth: Payer: Self-pay | Admitting: Psychiatry

## 2023-01-24 DIAGNOSIS — F4010 Social phobia, unspecified: Secondary | ICD-10-CM

## 2023-01-24 DIAGNOSIS — F5105 Insomnia due to other mental disorder: Secondary | ICD-10-CM

## 2023-01-24 DIAGNOSIS — F410 Panic disorder [episodic paroxysmal anxiety] without agoraphobia: Secondary | ICD-10-CM

## 2023-01-25 ENCOUNTER — Other Ambulatory Visit: Payer: Self-pay

## 2023-01-25 ENCOUNTER — Other Ambulatory Visit (HOSPITAL_COMMUNITY): Payer: Self-pay

## 2023-01-25 DIAGNOSIS — F5105 Insomnia due to other mental disorder: Secondary | ICD-10-CM

## 2023-01-25 DIAGNOSIS — F4010 Social phobia, unspecified: Secondary | ICD-10-CM

## 2023-01-25 DIAGNOSIS — F410 Panic disorder [episodic paroxysmal anxiety] without agoraphobia: Secondary | ICD-10-CM

## 2023-01-25 MED ORDER — ALPRAZOLAM 1 MG PO TABS: 1.0000 mg | ORAL_TABLET | Freq: Three times a day (TID) | ORAL | 0 refills | Status: DC | PRN

## 2023-01-25 NOTE — Telephone Encounter (Signed)
Donald Jenkins called to check status of the refill of his Xanax.  I told him we received it this morning. I told him the last prescription has 5 refills and he should have a refill at the pharmacy, but he was told by the pharmacy that there are no more refills.  Appt 8/1

## 2023-01-25 NOTE — Telephone Encounter (Signed)
The way the Rx was written it expired, although it was written for 5 RF.

## 2023-01-25 NOTE — Telephone Encounter (Signed)
Pended.

## 2023-01-28 ENCOUNTER — Encounter: Payer: Self-pay | Admitting: Psychiatry

## 2023-01-28 ENCOUNTER — Telehealth: Payer: Self-pay | Admitting: Psychiatry

## 2023-01-28 ENCOUNTER — Ambulatory Visit (INDEPENDENT_AMBULATORY_CARE_PROVIDER_SITE_OTHER): Payer: Medicaid Other | Admitting: Psychiatry

## 2023-01-28 DIAGNOSIS — F4010 Social phobia, unspecified: Secondary | ICD-10-CM | POA: Diagnosis not present

## 2023-01-28 DIAGNOSIS — F5105 Insomnia due to other mental disorder: Secondary | ICD-10-CM

## 2023-01-28 DIAGNOSIS — F410 Panic disorder [episodic paroxysmal anxiety] without agoraphobia: Secondary | ICD-10-CM | POA: Diagnosis not present

## 2023-01-28 MED ORDER — ALPRAZOLAM 1 MG PO TABS: 1.0000 mg | ORAL_TABLET | Freq: Three times a day (TID) | ORAL | 5 refills | Status: DC | PRN

## 2023-01-28 MED ORDER — ZALEPLON 10 MG PO CAPS: 10.0000 mg | ORAL_CAPSULE | Freq: Every evening | ORAL | 5 refills | Status: DC | PRN

## 2023-01-28 NOTE — Telephone Encounter (Signed)
Pt is notified by pharmacy that he can only get #15 Zaleplon thru Medicaid without a PA. He will go ahead and pick up the #15, but would like [place] to work on getting him a PA for the #30 day supply.
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
## 2023-02-01 ENCOUNTER — Ambulatory Visit: Payer: Medicaid Other | Admitting: Psychiatry

## 2023-02-14 NOTE — Telephone Encounter (Signed)
Prior Approval received for Zaleplon 10 mg capsules with [employer] effective through 08/15/2023, PA# [id number]

## 2023-02-21 ENCOUNTER — Other Ambulatory Visit: Payer: Self-pay | Admitting: Psychiatry

## 2023-02-21 DIAGNOSIS — F5105 Insomnia due to other mental disorder: Secondary | ICD-10-CM

## 2023-03-11 ENCOUNTER — Telehealth: Payer: Self-pay | Admitting: Neurology

## 2023-03-11 NOTE — Telephone Encounter (Signed)
no

## 2023-03-11 NOTE — Telephone Encounter (Signed)
REQUIRED PHONE NOTE:Pt called to r/s an appointment

## 2023-03-17 ENCOUNTER — Other Ambulatory Visit: Payer: Self-pay

## 2023-03-17 ENCOUNTER — Other Ambulatory Visit: Payer: Self-pay | Admitting: Family Medicine

## 2023-03-17 DIAGNOSIS — K219 Gastro-esophageal reflux disease without esophagitis: Secondary | ICD-10-CM

## 2023-03-18 ENCOUNTER — Other Ambulatory Visit: Payer: Self-pay

## 2023-03-18 ENCOUNTER — Ambulatory Visit: Payer: Medicaid Other | Admitting: Neurology

## 2023-03-18 MED ORDER — PANTOPRAZOLE SODIUM 40 MG PO TBEC
40.0000 mg | DELAYED_RELEASE_TABLET | Freq: Every day | ORAL | 0 refills | Status: DC
  Filled 2023-03-18: qty 30, 30d supply, fill #0
  Filled 2023-04-18 – 2023-05-04 (×3): qty 30, 30d supply, fill #1
  Filled 2023-05-31: qty 30, 30d supply, fill #2

## 2023-03-18 NOTE — Telephone Encounter (Signed)
Requested Prescriptions  Pending Prescriptions Disp Refills   pantoprazole (PROTONIX) 40 MG tablet 90 tablet 0    Sig: Take 1 tablet (40 mg total) by mouth daily.     Gastroenterology: Proton Pump Inhibitors Passed - 03/17/2023  3:07 PM      Passed - Valid encounter within last 12 months    Recent Outpatient Visits           4 months ago Essential hypertension   [facility] [place], Odette Horns, MD   6 months ago Hypotension due to hypovolemia   [facility] Hoy Register, MD   6 months ago Hypotension due to hypovolemia   [facility] Hoy Register, MD   1 year ago Seizure [facility])   [facility] Hoy Register, MD   1 year ago Colon cancer screening   [facility] [place], Marzella [place], [place]       Future Appointments             In 1 month Hoy Register, MD [facility]

## 2023-03-22 ENCOUNTER — Other Ambulatory Visit: Payer: Self-pay | Admitting: Family Medicine

## 2023-03-22 ENCOUNTER — Other Ambulatory Visit: Payer: Self-pay

## 2023-03-22 DIAGNOSIS — I10 Essential (primary) hypertension: Secondary | ICD-10-CM

## 2023-03-23 ENCOUNTER — Other Ambulatory Visit: Payer: Self-pay

## 2023-03-23 NOTE — Telephone Encounter (Signed)
Requested Prescriptions  Refused Prescriptions Disp Refills   lisinopril-hydrochlorothiazide (ZESTORETIC) 10-12.5 MG tablet 90 tablet 1    Sig: Take 1 tablet by mouth daily.     Cardiovascular:  ACEI + Diuretic Combos Failed - 03/22/2023 11:39 AM      Failed - Na in normal range and within 180 days    Sodium  Date Value Ref Range Status  09/06/2022 132 (L) 135 - 145 mmol/L Final  02/19/2022 141 134 - 144 mmol/L Final         Failed - K in normal range and within 180 days    Potassium  Date Value Ref Range Status  09/06/2022 3.7 3.5 - 5.1 mmol/L Final         Failed - Cr in normal range and within 180 days    Creat  Date Value Ref Range Status  01/14/2016 1.05 0.60 - 1.35 mg/dL Final   Creatinine, Ser  Date Value Ref Range Status  09/06/2022 1.01 0.61 - 1.24 mg/dL Final   Creatinine, Urine  Date Value Ref Range Status  09/04/2022 86 mg/dL Final    Comment:    Performed at [facility], [address], [place], [place] [address]         Failed - eGFR is 30 or above and within 180 days    GFR, Est African American  Date Value Ref Range Status  01/14/2016 >89 >=60 mL/min Final   GFR calc Af Amer  Date Value Ref Range Status  08/07/2020 101 >59 mL/min/1.73 Final    Comment:    **In accordance with recommendations from the NKF-ASN Task force,**   [facility] is in the process of updating its eGFR calculation to the   2021 CKD-EPI creatinine equation that estimates kidney function   without a race variable.    GFR, Est Non African American  Date Value Ref Range Status  01/14/2016 86 >=60 mL/min Final   GFR, Estimated  Date Value Ref Range Status  09/06/2022 >60 >60 mL/min Final    Comment:    (NOTE) Calculated using the CKD-EPI Creatinine Equation (2021)    eGFR  Date Value Ref Range Status  02/19/2022 92 >59 mL/min/1.73 Final         Passed - Patient is not pregnant      Passed - Last BP in normal range    BP Readings from Last 1  Encounters:  10/27/22 106/76         Passed - Valid encounter within last 6 months    Recent Outpatient Visits           4 months ago Essential hypertension   [place] [facility] [place], Odette Horns, MD   6 months ago Hypotension due to hypovolemia   [facility] Hoy Register, MD   6 months ago Hypotension due to hypovolemia   [facility] Hoy Register, MD   1 year ago Seizure [facility])   [facility] Hoy Register, MD   1 year ago Colon cancer screening   [place] [facility], Marzella Schlein, [place]       Future Appointments             In 1 month Hoy Register, MD [facility]

## 2023-03-24 ENCOUNTER — Other Ambulatory Visit: Payer: Self-pay

## 2023-04-22 ENCOUNTER — Other Ambulatory Visit: Payer: Self-pay

## 2023-04-23 ENCOUNTER — Other Ambulatory Visit: Payer: Self-pay

## 2023-04-28 ENCOUNTER — Ambulatory Visit: Payer: Medicaid Other | Admitting: Family Medicine

## 2023-04-29 ENCOUNTER — Ambulatory Visit: Payer: Medicaid Other | Admitting: Physician Assistant

## 2023-04-29 ENCOUNTER — Other Ambulatory Visit (HOSPITAL_COMMUNITY): Payer: Self-pay

## 2023-05-03 ENCOUNTER — Other Ambulatory Visit: Payer: Self-pay

## 2023-05-04 ENCOUNTER — Other Ambulatory Visit: Payer: Self-pay

## 2023-05-12 ENCOUNTER — Encounter: Payer: Self-pay | Admitting: Psychiatry

## 2023-05-13 ENCOUNTER — Encounter: Payer: Self-pay | Admitting: Physician Assistant

## 2023-05-13 ENCOUNTER — Ambulatory Visit: Payer: Medicaid Other | Attending: Physician Assistant | Admitting: Physician Assistant

## 2023-05-13 VITALS — BP 128/78 | HR 72 | Ht 71.0 in | Wt 200.6 lb

## 2023-05-13 DIAGNOSIS — R030 Elevated blood-pressure reading, without diagnosis of hypertension: Secondary | ICD-10-CM

## 2023-05-13 DIAGNOSIS — D508 Other iron deficiency anemias: Secondary | ICD-10-CM | POA: Diagnosis not present

## 2023-05-13 DIAGNOSIS — E871 Hypo-osmolality and hyponatremia: Secondary | ICD-10-CM
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
## 2023-05-13 NOTE — Patient Instructions (Signed)
Sit still and quiet and do deep breathing 5 mins then check blood pressure.  Check your blood pressure about 3 times weekly and record.  The goal average for your blood pressure is <130/<85.  If your blood pressures are consistently higher than that, let me know and I will send you some lisinopril.  But, currently your second blood pressure today was perfect!!

## 2023-05-14 LAB — CBC WITH DIFFERENTIAL/PLATELET
Basophils Absolute: 0.1 10*3/uL (ref 0.0–0.2)
Basos: 1 %
EOS (ABSOLUTE): 0.3 10*3/uL (ref 0.0–0.4)
Eos: 4 %
Hematocrit: 43.2 % (ref 37.5–51.0)
Hemoglobin: 13.9 g/dL (ref 13.0–17.7)
Immature Grans (Abs): 0 10*3/uL (ref 0.0–0.1)
Immature Granulocytes: 0 %
Lymphocytes Absolute: 2.1 10*3/uL (ref 0.7–3.1)
Lymphs: 31 %
MCH: 27 pg (ref 26.6–33.0)
MCHC: 32.2 g/dL (ref 31.5–35.7)
MCV: 84 fL (ref 79–97)
Monocytes Absolute: 0.5 10*3/uL (ref 0.1–0.9)
Monocytes: 7 %
Neutrophils Absolute: 3.9 10*3/uL (ref 1.4–7.0)
Neutrophils: 57 %
Platelets: 295 10*3/uL (ref 150–450)
RBC: 5.15 x10E6/uL (ref 4.14–5.80)
RDW: 14.6 % (ref 11.6–15.4)
WBC: 6.8 10*3/uL (ref 3.4–10.8)

## 2023-05-14 LAB — COMPREHENSIVE METABOLIC PANEL
ALT: 24 [IU]/L (ref 0–44)
AST: 22 [IU]/L (ref 0–40)
Albumin: 4.6 g/dL (ref 3.8–4.9)
Alkaline Phosphatase: 97 [IU]/L (ref 44–121)
BUN/Creatinine Ratio: 11 (ref 9–20)
BUN: 10 mg/dL (ref 6–24)
Bilirubin Total: 0.4 mg/dL (ref 0.0–1.2)
CO2: 20 mmol/L (ref 20–29)
Calcium: 9.4 mg/dL (ref 8.7–10.2)
Chloride: 102 mmol/L (ref 96–106)
Creatinine, Ser: 0.88 mg/dL (ref 0.76–1.27)
Globulin, Total: 3 g/dL (ref 1.5–4.5)
Glucose: 76 mg/dL (ref 70–99)
Potassium: 3.9 mmol/L (ref 3.5–5.2)
Sodium: 142 mmol/L (ref 134–144)
Total Protein: 7.6 g/dL (ref 6.0–8.5)
eGFR: 104 mL/min/{1.73_m2} (ref >59)

## 2023-05-17 ENCOUNTER — Telehealth: Payer: Self-pay

## 2023-05-17 NOTE — Telephone Encounter (Signed)
Pt was called and vm was left, Information has been sent to nurse pool.

## 2023-05-17 NOTE — Telephone Encounter (Signed)
-----   Message from Georgian Co sent at 05/17/2023  7:36 AM EST ----- Hyacinth Meeker, your labs look great including kidneys, liver, electrolytes and blood sugar.  Check blood pressures as discussed and follow up as planned.  Thanks, Georgian Co, PA-C

## 2023-05-31 ENCOUNTER — Other Ambulatory Visit: Payer: Self-pay | Admitting: Neurology

## 2023-05-31 ENCOUNTER — Telehealth: Payer: Self-pay | Admitting: Neurology

## 2023-05-31 ENCOUNTER — Other Ambulatory Visit: Payer: Self-pay

## 2023-05-31 NOTE — Telephone Encounter (Signed)
Requested Prescriptions   Pending Prescriptions Disp Refills   cloBAZam (ONFI) 10 MG tablet [Pharmacy Med Name: cloBAZam 10 MG Oral Tablet] 30 tablet 0    Sig: TAKE 1 TABLET BY MOUTH AT BEDTIME   Last seen 09/10/22 Next appt 09/14/23 Dispenses   Dispensed Days Supply Quantity Provider Pharmacy  CLOBAZAM 10MG        TAB 01/23/2023 30 30 each Windell Norfolk, MD [facility]  CLOBAZAM  10 MG TABS 12/25/2022 30 30 tablet Windell Norfolk, MD [facility]  CLOBAZAM 10MG        TAB 12/24/2022 30 30 each Windell Norfolk, MD [facility]  CLOBAZAM 10MG        TAB 11/27/2022 30 30 each Windell Norfolk, MD [facility]  CLOBAZAM 10MG        TAB 09/10/2022 30 30 each Windell Norfolk, MD [facility]      How do dispenses affect the score?

## 2023-05-31 NOTE — Telephone Encounter (Signed)
Called and lvm for patient to call back. 

## 2023-05-31 NOTE — Telephone Encounter (Signed)
Took incoming call from patient, who reports he has not had any seizure medication in at least two months, he does not have any refills on the Onfi and did not request any. He has not had keppra in almost a year. He states he had a five minute episode on thanksgiving in his apartment while he was alone, he was on the phone and all of a sudden unable to speak or get words out to the person on the phone. States he remembers sitting down to try and relax and get it to pass and after 5 minutes he was okay.Reports no calls to EMS or ER visits. He reports that he had not had an episode prior since December of 2023. He reports that he had no alcohol on the day of the episode.   He is also requesting a note for his apartment complex with his diagnosis, he would not tell me what exactly the letter is for, he just stated a note to include his seizure activity, he states he will be coming by today to pick up his records and wanted the note to be included.

## 2023-05-31 NOTE — Telephone Encounter (Signed)
Pt stated, Keppra for seizure is not working like it suppose to. Have had two seizures this year. Have discontinued taking the Keppra because of seizure activity. Since stop taking the Keppra had vocal arrest seizure on Thanksgiving Day while on the phone.  Would like a call from the nurse.  Pt also ask to be transferred to Medical Records.

## 2023-06-01 ENCOUNTER — Encounter: Payer: Self-pay | Admitting: Neurology

## 2023-06-01 ENCOUNTER — Other Ambulatory Visit: Payer: Self-pay

## 2023-06-01 NOTE — Telephone Encounter (Signed)
I have refilled his Clobazam and completed the letter.

## 2023-06-02 ENCOUNTER — Telehealth: Payer: Self-pay

## 2023-06-02 DIAGNOSIS — F5105 Insomnia due to other mental disorder: Secondary | ICD-10-CM

## 2023-06-02 MED ORDER — ZALEPLON 10 MG PO CAPS: 10.0000 mg | ORAL_CAPSULE | Freq: Every evening | ORAL | 5 refills | Status: DC | PRN

## 2023-06-02 NOTE — Telephone Encounter (Signed)
Chris called at 10:15 to explain that the reason the pharmacy sent the refill request for this medication is because his insurance only allows 5 refills without a new prescription.  So the pharmacy can't refill until they have a new prescription.  App 12/31.

## 2023-06-02 NOTE — Telephone Encounter (Addendum)
Was already pended to provider and has been approved.

## 2023-06-03 ENCOUNTER — Encounter: Payer: Self-pay | Admitting: Neurology

## 2023-06-03 ENCOUNTER — Ambulatory Visit (INDEPENDENT_AMBULATORY_CARE_PROVIDER_SITE_OTHER): Payer: Medicaid Other | Admitting: Neurology

## 2023-06-03 ENCOUNTER — Other Ambulatory Visit: Payer: Self-pay

## 2023-06-03 VITALS — BP 171/94 | HR 78 | Resp 16 | Wt 200.6 lb

## 2023-06-03 DIAGNOSIS — F419 Anxiety disorder, unspecified: Secondary | ICD-10-CM | POA: Diagnosis not present

## 2023-06-03 DIAGNOSIS — G40909 Epilepsy, unspecified, not intractable, without status epilepticus: Secondary | ICD-10-CM

## 2023-06-03 MED ORDER — CLOBAZAM 10 MG PO TABS: 10.0000 mg | ORAL_TABLET | Freq: Every day | ORAL | 5 refills | Status: DC

## 2023-06-03 NOTE — Patient Instructions (Signed)
Routine EEG  Continue with Clobazam 10 mg nightly  Continue to work closely with your psychiatrist regarding Insomnia treatment.  Follow up in 6 to 8 months
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
## 2023-06-29 ENCOUNTER — Encounter: Payer: Self-pay | Admitting: Psychiatry

## 2023-06-29 ENCOUNTER — Telehealth (INDEPENDENT_AMBULATORY_CARE_PROVIDER_SITE_OTHER): Payer: Medicaid Other | Admitting: Psychiatry

## 2023-06-29 DIAGNOSIS — F4010 Social phobia, unspecified: Secondary | ICD-10-CM | POA: Diagnosis not present

## 2023-06-29 DIAGNOSIS — F5105 Insomnia due to other mental disorder: Secondary | ICD-10-CM | POA: Diagnosis not present

## 2023-06-29 DIAGNOSIS — F410 Panic disorder [episodic paroxysmal anxiety] without agoraphobia: Secondary | ICD-10-CM

## 2023-06-29 MED ORDER — ALPRAZOLAM 1 MG PO TABS: 1.0000 mg | ORAL_TABLET | Freq: Three times a day (TID) | ORAL | 5 refills | Status: DC | PRN

## 2023-06-29 MED ORDER — QUVIVIQ 50 MG PO TABS: 50.0000 mg | ORAL_TABLET | Freq: Every day | ORAL | 5 refills | Status: DC
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
## 2023-07-06 ENCOUNTER — Other Ambulatory Visit: Payer: Self-pay | Admitting: Family Medicine

## 2023-07-06 ENCOUNTER — Other Ambulatory Visit: Payer: Self-pay

## 2023-07-06 DIAGNOSIS — K219 Gastro-esophageal reflux disease without esophagitis: Secondary | ICD-10-CM

## 2023-07-06 MED ORDER — FAMOTIDINE 20 MG PO TABS
20.0000 mg | ORAL_TABLET | Freq: Two times a day (BID) | ORAL | 1 refills | Status: DC
  Filled 2023-07-06: qty 180, 90d supply, fill #0
  Filled 2023-10-30 – 2023-11-11 (×2): qty 180, 90d supply, fill #1

## 2023-07-06 MED ORDER — PANTOPRAZOLE SODIUM 40 MG PO TBEC
40.0000 mg | DELAYED_RELEASE_TABLET | Freq: Every day | ORAL | 1 refills | Status: DC
  Filled 2023-07-06: qty 90, 90d supply, fill #0
  Filled 2023-10-30 – 2023-11-11 (×2): qty 90, 90d supply, fill #1

## 2023-07-07 ENCOUNTER — Telehealth: Payer: Self-pay | Admitting: Psychiatry

## 2023-07-07 NOTE — Telephone Encounter (Signed)
 I see PA in CMM, but doesn't look like it has been sent to plan.

## 2023-07-07 NOTE — Telephone Encounter (Signed)
 Pt called to see if we received a PA for his Quviviq 50 mg. Please call pt and let him know at [phone]

## 2023-07-08 ENCOUNTER — Other Ambulatory Visit: Payer: Self-pay

## 2023-07-08 NOTE — Telephone Encounter (Signed)
Pending with [facility]

## 2023-07-08 NOTE — Telephone Encounter (Signed)
 Request Reference Number: [id number]. QUVIVIQ TAB 50MG  is approved through 07/07/2024.

## 2023-07-09 ENCOUNTER — Other Ambulatory Visit: Payer: Self-pay

## 2023-07-09 NOTE — Telephone Encounter (Signed)
Patient notified of PA approval

## 2023-07-13 ENCOUNTER — Encounter: Payer: Self-pay | Admitting: *Deleted

## 2023-07-13 ENCOUNTER — Other Ambulatory Visit: Payer: Medicaid Other | Admitting: *Deleted

## 2023-07-14 ENCOUNTER — Telehealth: Payer: Self-pay | Admitting: Neurology

## 2023-07-14 NOTE — Telephone Encounter (Signed)
 Pt rescheduled appt.

## 2023-08-03 ENCOUNTER — Ambulatory Visit: Payer: Medicaid Other | Admitting: Psychiatry

## 2023-08-05 ENCOUNTER — Ambulatory Visit (INDEPENDENT_AMBULATORY_CARE_PROVIDER_SITE_OTHER): Payer: Medicaid Other | Admitting: Neurology

## 2023-08-05 DIAGNOSIS — G40909 Epilepsy, unspecified, not intractable, without status epilepticus: Secondary | ICD-10-CM

## 2023-08-09 NOTE — Procedures (Signed)
    History:  53 year old man with seizure   EEG classification: Awake and drowsy  Duration: 28 minutes   Technical aspects: This EEG study was done with scalp electrodes positioned according to the 10-20 International system of electrode placement. Electrical activity was reviewed with band pass filter of 1-70Hz , sensitivity of 7 uV/mm, display speed of 65mm/sec with a 60Hz  notched filter applied as appropriate. EEG data were recorded continuously and digitally stored.   Description of the recording: The background rhythms of this recording consists of a fairly well modulated medium amplitude alpha rhythm of 11 Hz that is reactive to eye opening and closure. Present in the anterior head region is a 15-20 Hz beta activity. Photic stimulation was performed, did not show any abnormalities. Hyperventilation was also performed, did not show any abnormalities. Drowsiness was manifested by background fragmentation. No abnormal epileptiform discharges seen during this recording. There was no focal slowing. There were no electrographic seizure identified.   Abnormality: None   Impression: This is a normal awake and drowsy EEG. No evidence of interictal epileptiform discharges. Normal EEGs, however, do not rule out epilepsy.    Deaja Rizo, MD [facility]

## 2023-08-10 ENCOUNTER — Encounter: Payer: Self-pay | Admitting: Neurology

## 2023-09-02 ENCOUNTER — Encounter: Payer: Self-pay | Admitting: Physician Assistant

## 2023-09-14 ENCOUNTER — Ambulatory Visit: Payer: Medicaid Other | Admitting: Neurology

## 2023-11-01 ENCOUNTER — Other Ambulatory Visit: Payer: Self-pay

## 2023-11-10 ENCOUNTER — Other Ambulatory Visit: Payer: Self-pay

## 2023-11-11 ENCOUNTER — Ambulatory Visit: Payer: Medicaid Other | Attending: Family Medicine | Admitting: Family Medicine

## 2023-11-11 ENCOUNTER — Encounter: Payer: Self-pay | Admitting: Family Medicine

## 2023-11-11 ENCOUNTER — Other Ambulatory Visit: Payer: Self-pay

## 2023-11-11 VITALS — BP 157/109 | HR 88 | Ht 71.0 in | Wt 228.0 lb

## 2023-11-11 DIAGNOSIS — F5105 Insomnia due to other mental disorder: Secondary | ICD-10-CM

## 2023-11-11 DIAGNOSIS — F4010 Social phobia, unspecified: Secondary | ICD-10-CM

## 2023-11-11 DIAGNOSIS — Z125 Encounter for screening for malignant neoplasm of prostate: Secondary | ICD-10-CM

## 2023-11-11 DIAGNOSIS — Z131 Encounter for screening for diabetes mellitus: Secondary | ICD-10-CM

## 2023-11-11 DIAGNOSIS — K219 Gastro-esophageal reflux disease without esophagitis: Secondary | ICD-10-CM | POA: Diagnosis not present

## 2023-11-11 DIAGNOSIS — I10 Essential (primary) hypertension: Secondary | ICD-10-CM

## 2023-11-11 DIAGNOSIS — Z1211 Encounter for screening for malignant neoplasm of colon: Secondary | ICD-10-CM | POA: Diagnosis not present

## 2023-11-11 MED ORDER — LISINOPRIL 2.5 MG PO TABS: 2.5000 mg | ORAL_TABLET | Freq: Every day | ORAL | 1 refills | Status: DC

## 2023-11-11 MED ORDER — FAMOTIDINE 20 MG PO TABS: 20.0000 mg | ORAL_TABLET | Freq: Two times a day (BID) | ORAL | 1 refills | Status: DC
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
## 2023-11-11 NOTE — Patient Instructions (Signed)
 VISIT SUMMARY:  Today, you came in for a follow-up on your blood pressure management. We discussed your recent health concerns, including your blood pressure, seizure disorder, GERD, insomnia, and anxiety. We reviewed your current medications and made some adjustments to better manage your conditions.  YOUR PLAN:  -PRIMARY HYPERTENSION: Primary hypertension means that your blood pressure is consistently higher than normal. Your blood pressure today was 157/92 mmHg, which is elevated. We will start you on lisinopril  2.5 mg daily to help manage your blood pressure. Please obtain a new blood pressure monitor for home use and monitor your readings regularly. We will follow up in 3 months to reassess your blood pressure and the effectiveness of the medication.  -SEIZURE DISORDER: A seizure disorder means you have had episodes of abnormal electrical activity in your brain. You mentioned a recent seizure with vocal arrest and staring spells. It is important to inform your neurologist about this recent seizure so they can consider adjusting your medication.  -GASTROESOPHAGEAL REFLUX DISEASE (GERD): GERD is a condition where stomach acid frequently flows back into the tube connecting your mouth and stomach, causing discomfort. Your symptoms are currently controlled with famotidine , and you use pantoprazole  as needed. We discussed the long-term risks of using proton pump inhibitors (PPIs). Your prescriptions for famotidine  and pantoprazole  have been refilled.  -INSOMNIA: Insomnia is a condition where you have trouble falling or staying asleep. You have had chronic insomnia since 2014. Quviviq  causes vivid dreams without helping you sleep, and while Ambien  is effective, it has side effects. We will continue to monitor this condition and explore other treatment options as needed.  -ANXIETY AND PANIC DISORDER: Anxiety and panic disorder means you experience excessive worry and sudden episodes of intense fear. You  are currently under psychiatric care, and cognitive behavioral therapy (CBT) has not been effective for you. You are awaiting an appointment with a new psychiatrist for further management. Your current medications will continue to be managed by your psychiatrist.  INSTRUCTIONS:  Please follow up in 3 months to reassess your blood pressure and the effectiveness of the lisinopril . Inform your neurologist about your recent seizure for potential medication adjustment. Continue using famotidine  and pantoprazole  as needed for GERD. Monitor your insomnia and discuss any changes or concerns during your next visit. Await your appointment with the new psychiatrist for further management of your anxiety and panic disorder.

## 2023-11-12 ENCOUNTER — Other Ambulatory Visit: Payer: Self-pay

## 2023-11-12 ENCOUNTER — Ambulatory Visit: Payer: Self-pay | Admitting: Family Medicine

## 2023-11-12 LAB — CMP14+EGFR
ALT: 13 IU/L (ref 0–44)
AST: 15 IU/L (ref 0–40)
Albumin: 4.3 g/dL (ref 3.8–4.9)
Alkaline Phosphatase: 104 IU/L (ref 44–121)
BUN/Creatinine Ratio: 11 (ref 9–20)
BUN: 12 mg/dL (ref 6–24)
Bilirubin Total: 0.2 mg/dL (ref 0.0–1.2)
CO2: 22 mmol/L (ref 20–29)
Calcium: 9.3 mg/dL (ref 8.7–10.2)
Chloride: 102 mmol/L (ref 96–106)
Creatinine, Ser: 1.08 mg/dL (ref 0.76–1.27)
Globulin, Total: 2.5 g/dL (ref 1.5–4.5)
Glucose: 107 mg/dL — ABNORMAL HIGH (ref 70–99)
Potassium: 5 mmol/L (ref 3.5–5.2)
Sodium: 141 mmol/L (ref 134–144)
Total Protein: 6.8 g/dL (ref 6.0–8.5)
eGFR: 83 mL/min/{1.73_m2} (ref >59)

## 2023-11-12 LAB — LP+NON-HDL CHOLESTEROL
Cholesterol, Total: 203 mg/dL — ABNORMAL HIGH (ref 100–199)
HDL: 59 mg/dL (ref >39)
LDL Chol Calc (NIH): 113 mg/dL — ABNORMAL HIGH (ref 0–99)
Total Non-HDL-Chol (LDL+VLDL): 144 mg/dL — ABNORMAL HIGH (ref 0–129)
Triglycerides: 181 mg/dL — ABNORMAL HIGH (ref 0–149)
VLDL Cholesterol Cal: 31 mg/dL (ref 5–40)

## 2023-11-12 LAB — PSA, TOTAL AND FREE
PSA, Free Pct: 36.7 %
PSA, Free: 0.22 ng/mL
Prostate Specific Ag, Serum: 0.6 ng/mL (ref 0.0–4.0)

## 2023-11-12 LAB — HEMOGLOBIN A1C
Est. average glucose Bld gHb Est-mCnc: 111 mg/dL
Hgb A1c MFr Bld: 5.5 % (ref 4.8–5.6)

## 2023-11-12 MED ORDER — BLOOD PRESSURE KIT: PACK | 0 refills | Status: AC

## 2023-12-13 ENCOUNTER — Telehealth: Payer: Self-pay | Admitting: Neurology

## 2023-12-13 ENCOUNTER — Telehealth: Payer: Self-pay | Admitting: Psychiatry

## 2023-12-13 NOTE — Telephone Encounter (Signed)
 Prev patient of Roselyn Connor. Wants to see if he can continue here.

## 2023-12-14 ENCOUNTER — Telehealth: Payer: Self-pay | Admitting: Psychiatry

## 2023-12-14 NOTE — Telephone Encounter (Signed)
 Pt called checking on the status of PA on his daridorexant  50 mg tabs. Please call him at [phone]

## 2023-12-14 NOTE — Telephone Encounter (Signed)
 According to note in January he is approved through 2026

## 2023-12-14 NOTE — Telephone Encounter (Signed)
 No he is treatment resistant and needs to see psychiatrist and I can't see him.  Not approprate for mid - level provider.

## 2023-12-15 NOTE — Telephone Encounter (Signed)
 I will call pt to tell him and send a letter.

## 2023-12-16 ENCOUNTER — Other Ambulatory Visit: Payer: Self-pay

## 2023-12-16 DIAGNOSIS — F5105 Insomnia due to other mental disorder: Secondary | ICD-10-CM

## 2023-12-16 MED ORDER — QUVIVIQ 50 MG PO TABS
50.0000 mg | ORAL_TABLET | Freq: Every day | ORAL | 0 refills | Status: DC
Start: 2023-12-16 — End: 2024-01-19

## 2023-12-16 NOTE — Telephone Encounter (Signed)
 Patient does not understand why he is being referred out. He takes Xanax  for social anxiety and takes Quiviuq for sleep. He feels he is stable on these meds. In Jessica's last note she referred him to [facility] but appt was not made at that time.  He doesn't take anything for depression and just doesn't want to take anything. He followed up every 4- 6 months with JC.He just needs help with sleep. Can we reconsider? I have sent the letter as well to him and discussed what the letter says with him.

## 2023-12-30 ENCOUNTER — Telehealth: Payer: Self-pay

## 2023-12-30 ENCOUNTER — Ambulatory Visit (INDEPENDENT_AMBULATORY_CARE_PROVIDER_SITE_OTHER): Payer: Medicaid Other | Admitting: Neurology

## 2023-12-30 ENCOUNTER — Encounter: Payer: Self-pay | Admitting: Neurology

## 2023-12-30 VITALS — BP 108/86 | HR 103 | Resp 15 | Ht 71.0 in | Wt 227.5 lb

## 2023-12-30 DIAGNOSIS — C720 Malignant neoplasm of spinal cord: Secondary | ICD-10-CM

## 2023-12-30 DIAGNOSIS — G40909 Epilepsy, unspecified, not intractable, without status epilepticus: Secondary | ICD-10-CM | POA: Diagnosis not present

## 2023-12-30 NOTE — Patient Instructions (Signed)
 Continue with clobazam  every other day Referral to neurosurgery for spinal epidural murmur Follow-up in 1 year or sooner if worse.
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
## 2023-12-30 NOTE — Telephone Encounter (Signed)
 [facility] at [facility] and confirmed they do take [facility] and see for this diagnosis. Sent referral to their office via EPIC

## 2024-01-04 ENCOUNTER — Other Ambulatory Visit: Payer: Self-pay | Admitting: Neurology

## 2024-01-04 NOTE — Telephone Encounter (Signed)
 Requested Prescriptions   Pending Prescriptions Disp Refills   cloBAZam  (ONFI ) 10 MG tablet [Pharmacy Med Name: cloBAZam  10 MG Oral Tablet] 30 tablet 0    Sig: TAKE 1 TABLET BY MOUTH AT BEDTIME   Last seen 12/30/23, next appt is not scheduled    Dispenses   Dispensed Days Supply Quantity Provider Pharmacy  CLOBAZAM  10MG        TAB 11/28/2023 30 30 each Camara, Amadou, MD [facility]  CLOBAZAM  10MG        TAB 10/31/2023 30 30 each Camara, Amadou, MD [facility]...  CLOBAZAM  10MG        TAB 10/03/2023 30 30 each Gregg Lek, MD [facility]  CLOBAZAM  10MG        TAB 08/30/2023 30 30 each Gregg Lek, MD [facility]  CLOBAZAM  10MG        TAB 08/03/2023 30 30 each Camara, Amadou, MD [facility]...  CLOBAZAM  10MG        TAB 07/02/2023 30 30 each Gregg Lek, MD [facility]  CLOBAZAM  10MG        TAB 06/01/2023 30 30 each Gregg Lek, MD [facility]  CLOBAZAM  10MG        TAB 01/23/2023 30 30 each Gregg Lek, MD [email or web address]

## 2024-01-10 ENCOUNTER — Encounter: Payer: Self-pay | Admitting: Family Medicine

## 2024-01-14 ENCOUNTER — Encounter: Payer: Self-pay | Admitting: Neurosurgery
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
## 2024-01-14 NOTE — Telephone Encounter (Signed)
 Paperwork given to provider and will call once filled out

## 2024-01-14 NOTE — Telephone Encounter (Signed)
 Copied from CRM [id number]. Topic: General - Other >> Jan 14, 2024 10:28 AM Jayma L wrote:  Reason for CRM: patient called asking if paperwork was ready for [place] [employer]V, asking for a callback when its ready

## 2024-01-17 ENCOUNTER — Telehealth: Payer: Self-pay

## 2024-01-17 ENCOUNTER — Telehealth: Payer: Self-pay | Admitting: Psychiatry

## 2024-01-17 NOTE — Telephone Encounter (Signed)
 Patient called in with [employer] agent requesting an update on DMV form that was dropped of 7/14 patient is requesting a call back from CMA.

## 2024-01-17 NOTE — Telephone Encounter (Signed)
 Pt will need RF on Alprazolam . Believes it is due this Sunday, would like to pick up at [facility]: [facility] [address], [place] - [address]  [address] [place] [address]   Has an upcoming appt with a new provider.

## 2024-01-17 NOTE — Telephone Encounter (Signed)
 Patient has been called and informed that paperwork is ready for pick up

## 2024-01-17 NOTE — Telephone Encounter (Signed)
LF 6/28, due 7/26

## 2024-01-18 ENCOUNTER — Other Ambulatory Visit: Payer: Self-pay | Admitting: Adult Health

## 2024-01-18 DIAGNOSIS — F5105 Insomnia due to other mental disorder: Secondary | ICD-10-CM

## 2024-01-18 DIAGNOSIS — Z0289 Encounter for other administrative examinations: Secondary | ICD-10-CM

## 2024-01-18 NOTE — Telephone Encounter (Signed)
 Has appt with new provider. ? When.

## 2024-01-19 NOTE — Telephone Encounter (Signed)
 He met with another provider 7/23 and she was going to change all his meds so he is not comfortable seeing her again. He's been on the same meds for years. Will be looking again for another provider.  He will still need his Quiviq

## 2024-01-20 ENCOUNTER — Ambulatory Visit (INDEPENDENT_AMBULATORY_CARE_PROVIDER_SITE_OTHER): Admitting: Orthopedic Surgery

## 2024-01-20 ENCOUNTER — Encounter: Payer: Self-pay | Admitting: Neurosurgery

## 2024-01-20 VITALS — BP 124/82 | Ht 71.0 in | Wt 229.5 lb

## 2024-01-20 DIAGNOSIS — M47816 Spondylosis without myelopathy or radiculopathy, lumbar region: Secondary | ICD-10-CM

## 2024-01-20 DIAGNOSIS — C720 Malignant neoplasm of spinal cord: Secondary | ICD-10-CM | POA: Diagnosis not present

## 2024-01-20 DIAGNOSIS — C719 Malignant neoplasm of brain, unspecified: Secondary | ICD-10-CM

## 2024-01-20 DIAGNOSIS — Z8781 Personal history of (healed) traumatic fracture: Secondary | ICD-10-CM

## 2024-01-20 NOTE — Patient Instructions (Signed)
 It was so nice to see you today. Thank you so much for coming in.    Your last lumbar MRI in 2021 showed that mass was stable.   I want to get an updated MRI of your lower back with and without contrast to look into things further. We will get this approved through your insurance and [facility] will call you to schedule the appointment.   Ask about your patient responsibility. You do not need to pay this prior to getting MRI, they can bill you.   After you have the MRI, it can take 14-28 days for me to get the results back. If I don't have them in 2 weeks, we will call to try to get the results.   Please do not hesitate to call if you have any questions or concerns. You can also message me in MyChart.   Glade Boys PA-C [phone]     The physicians and staff at [facility] at [facility] are committed to providing excellent care. You may receive a survey asking for feedback about your experience at our office. We value you your feedback and appreciate you taking the time to to fill it out. The [facility] leadership team is also available to discuss your experience in person, feel free to contact us  [phone].

## 2024-01-21 ENCOUNTER — Other Ambulatory Visit: Payer: Self-pay

## 2024-01-21 ENCOUNTER — Encounter: Payer: Self-pay | Admitting: Orthopedic Surgery

## 2024-01-21 DIAGNOSIS — F5105 Insomnia due to other mental disorder: Secondary | ICD-10-CM

## 2024-01-21 DIAGNOSIS — F4010 Social phobia, unspecified: Secondary | ICD-10-CM

## 2024-01-21 DIAGNOSIS — F410 Panic disorder [episodic paroxysmal anxiety] without agoraphobia: Secondary | ICD-10-CM

## 2024-01-21 MED ORDER — ALPRAZOLAM 1 MG PO TABS: 1.0000 mg | ORAL_TABLET | Freq: Three times a day (TID) | ORAL | 0 refills | Status: AC | PRN

## 2024-01-21 NOTE — Telephone Encounter (Signed)
 Pended for 7/26

## 2024-02-08 ENCOUNTER — Other Ambulatory Visit: Payer: Self-pay | Admitting: Family Medicine

## 2024-02-08 ENCOUNTER — Other Ambulatory Visit: Payer: Self-pay

## 2024-02-08 DIAGNOSIS — K219 Gastro-esophageal reflux disease without esophagitis: Secondary | ICD-10-CM

## 2024-02-08 MED ORDER — PANTOPRAZOLE SODIUM 40 MG PO TBEC
40.0000 mg | DELAYED_RELEASE_TABLET | Freq: Every day | ORAL | 0 refills | Status: DC
  Filled 2024-02-08: qty 90, 90d supply, fill #0

## 2024-02-09 ENCOUNTER — Other Ambulatory Visit: Payer: Self-pay

## 2024-02-15 ENCOUNTER — Other Ambulatory Visit: Payer: Self-pay

## 2024-02-17 ENCOUNTER — Other Ambulatory Visit: Payer: Self-pay

## 2024-02-17 ENCOUNTER — Ambulatory Visit
Admission: RE | Admit: 2024-02-17 | Discharge: 2024-02-17 | Disposition: A | Discharge status: Home / Self Care | Source: Ambulatory Visit | Attending: Orthopedic Surgery | Admitting: Orthopedic Surgery

## 2024-02-17 DIAGNOSIS — Z8781 Personal history of (healed) traumatic fracture: Secondary | ICD-10-CM

## 2024-02-17 DIAGNOSIS — M47816 Spondylosis without myelopathy or radiculopathy, lumbar region: Secondary | ICD-10-CM

## 2024-02-17 DIAGNOSIS — C719 Malignant neoplasm of brain, unspecified: Secondary | ICD-10-CM

## 2024-02-17 MED ORDER — GADOPICLENOL 0.5 MMOL/ML IV SOLN
10.0000 mL | Freq: Once | INTRAVENOUS | Status: AC | PRN
  Administered 2024-02-17: 10 mL via INTRAVENOUS

## 2024-03-02 ENCOUNTER — Encounter: Payer: Self-pay | Admitting: Orthopedic Surgery

## 2024-03-02 NOTE — Progress Notes (Signed)
 Lumbar MRI dated 02/17/24:  FINDINGS: Segmentation:  Standard.   Alignment:  Normal.   Vertebrae: Mild chronic T12-L5 compression fractures with stable to at most slightly increased height loss since 2021. No acute fracture or suspicious marrow lesion.   Conus medullaris and cauda equina: Conus extends to the L1-2 level and is normal in signal. Unchanged 9 mm enhancing intradural mass in the posterior spinal canal minimally right of midline at the L2 level.   Paraspinal and other soft tissues: Unremarkable.   Disc levels:   Mild disc desiccation throughout the lumbar spine.   T12-L1: Negative.   L1-2: Mild disc bulging, slightly progressed. Mild facet hypertrophy. No stenosis.   L2-3: Mild facet hypertrophy, stable to mildly increased. No disc herniation or stenosis.   L3-4: Mild disc bulging and mild left greater than right facet hypertrophy, stable to mildly increased. No stenosis.   L4-5: Mild disc bulging and mild facet hypertrophy without stenosis, unchanged.   L5-S1: Small central disc protrusion, stable to slightly decreased in size. Mild facet hypertrophy. No stenosis.   IMPRESSION: 1. 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 2. Mild lumbar spondylosis and facet hypertrophy without stenosis. 3. Mild chronic T12-L5 compression fractures.     Electronically Signed   By: Dasie Hamburg M.D.   On: 02/27/2024 12:59  Above imaging reviewed with Dr. Clois. No change in intradural mass from previous MRI. He recommends repeat scan in 2 years.
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
## 2024-03-08 ENCOUNTER — Ambulatory Visit (INDEPENDENT_AMBULATORY_CARE_PROVIDER_SITE_OTHER): Admitting: Neurosurgery

## 2024-03-08 VITALS — BP 118/72 | Ht 71.0 in | Wt 220.0 lb

## 2024-03-08 DIAGNOSIS — D334 Benign neoplasm of spinal cord: Secondary | ICD-10-CM

## 2024-03-08 DIAGNOSIS — C719 Malignant neoplasm of brain, unspecified: Secondary | ICD-10-CM

## 2024-04-12 ENCOUNTER — Other Ambulatory Visit: Payer: Self-pay | Admitting: Family Medicine

## 2024-04-12 ENCOUNTER — Other Ambulatory Visit: Payer: Self-pay

## 2024-04-12 DIAGNOSIS — K219 Gastro-esophageal reflux disease without esophagitis: Secondary | ICD-10-CM

## 2024-04-12 MED ORDER — FAMOTIDINE 20 MG PO TABS
20.0000 mg | ORAL_TABLET | Freq: Two times a day (BID) | ORAL | 0 refills | Status: DC
  Filled 2024-04-12: qty 60, 30d supply, fill #0

## 2024-04-21 ENCOUNTER — Other Ambulatory Visit: Payer: Self-pay

## 2024-04-21 ENCOUNTER — Other Ambulatory Visit: Payer: Self-pay | Admitting: Family Medicine

## 2024-04-22 NOTE — Telephone Encounter (Signed)
 Requested Prescriptions  Pending Prescriptions Disp Refills   lisinopril  (ZESTRIL ) 2.5 MG tablet [Pharmacy Med Name: Lisinopril  2.5 MG Oral Tablet] 90 tablet 1    Sig: Take 1 tablet by mouth once daily     Cardiovascular:  ACE Inhibitors Passed - 04/22/2024  9:30 PM      Passed - Cr in normal range and within 180 days    Creat  Date Value Ref Range Status  01/14/2016 1.05 0.60 - 1.35 mg/dL Final   Creatinine, Ser  Date Value Ref Range Status  11/11/2023 1.08 0.76 - 1.27 mg/dL Final   Creatinine, Urine  Date Value Ref Range Status  09/04/2022 86 mg/dL Final    Comment:    Performed at [facility], [address], [place], [place] [address]         Passed - K in normal range and within 180 days    Potassium  Date Value Ref Range Status  11/11/2023 5.0 3.5 - 5.2 mmol/L Final         Passed - Patient is not pregnant      Passed - Last BP in normal range    BP Readings from Last 1 Encounters:  03/08/24 118/72         Passed - Valid encounter within last 6 months    Recent Outpatient Visits           5 months ago Insomnia disorder, with non-sleep disorder mental comorbidity, persistent   [facility] [place]. [facility] Delbert Clam, MD   11 months ago Elevated BP without diagnosis of hypertension   [facility] Of [place]. [facility] [place], [place], [place]   1 year ago Essential hypertension   [facility] [place] [facility] Of [place]. [facility] Delbert Clam, MD   1 year ago Hypotension due to hypovolemia   [facility] - [facility] Dept Of [place]. [facility] Delbert Clam, MD   1 year ago Hypotension due to hypovolemia   [facility] - [facility] Dept Of [place]. [facility] Delbert Clam, MD

## 2024-05-10 ENCOUNTER — Other Ambulatory Visit: Payer: Self-pay | Admitting: Family Medicine

## 2024-05-10 ENCOUNTER — Other Ambulatory Visit: Payer: Self-pay

## 2024-05-10 DIAGNOSIS — K219 Gastro-esophageal reflux disease without esophagitis: Secondary | ICD-10-CM

## 2024-05-10 MED ORDER — PANTOPRAZOLE SODIUM 40 MG PO TBEC
40.0000 mg | DELAYED_RELEASE_TABLET | Freq: Every day | ORAL | 0 refills | Status: DC
  Filled 2024-05-10: qty 90, 90d supply, fill #0

## 2024-05-22 ENCOUNTER — Other Ambulatory Visit: Payer: Self-pay | Admitting: Family Medicine

## 2024-05-22 DIAGNOSIS — K219 Gastro-esophageal reflux disease without esophagitis: Secondary | ICD-10-CM

## 2024-05-23 NOTE — Telephone Encounter (Signed)
 Requested medication (s) are due for refill today - yes  Requested medication (s) are on the active medication list -yes  Future visit scheduled no  Last refill: 04/12/24 #60  Notes to clinic: Last RF has notes- sent for review   Requested Prescriptions  Pending Prescriptions Disp Refills   famotidine  (PEPCID ) 20 MG tablet 60 tablet 0    Sig: Take 1 tablet (20 mg total) by mouth 2 (two) times daily. Please schedule PCP appointment with [facility] for more refills.     Gastroenterology:  H2 Antagonists Passed - 05/23/2024  4:05 PM      Passed - Valid encounter within last 12 months    Recent Outpatient Visits           6 months ago Insomnia disorder, with non-sleep disorder mental comorbidity, persistent   [facility] [place]. [facility] Delbert Clam, MD   1 year ago Elevated BP without diagnosis of hypertension   [facility] [place] [facility] Of [place]. [facility] [place], [place], [place]   1 year ago Essential hypertension   [facility] [place] [facility] Dept Of [place]. [facility] Delbert Clam, MD   1 year ago Hypotension due to hypovolemia   [facility] - [facility] Dept Of [place]. [facility] Delbert Clam, MD   1 year ago Hypotension due to hypovolemia   [facility] [facility] - A Dept Of [place]. [facility] Delbert Clam, MD                 Requested Prescriptions  Pending Prescriptions Disp Refills   famotidine  (PEPCID ) 20 MG tablet 60 tablet 0    Sig: Take 1 tablet (20 mg total) by mouth 2 (two) times daily. Please schedule PCP appointment with [facility] for more refills.     Gastroenterology:  H2 Antagonists Passed - 05/23/2024  4:05 PM      Passed - Valid encounter within last 12 months    Recent Outpatient Visits           6 months ago Insomnia disorder, with non-sleep disorder mental comorbidity, persistent    [facility] [place]. [facility] Delbert Clam, MD   1 year ago Elevated BP without diagnosis of hypertension   [facility] [facility] [place] [facility] Of [place]. [facility] [place], [place], [place]   1 year ago Essential hypertension   [facility] [place] [facility] Dept Of [place]. [facility] Delbert Clam, MD   1 year ago Hypotension due to hypovolemia   [facility] - [facility] Dept Of . [facility] Delbert Clam, MD   1 year ago Hypotension due to hypovolemia   [facility] - A Dept Of . [facility] Delbert Clam, MD

## 2024-05-29 ENCOUNTER — Other Ambulatory Visit: Payer: Self-pay | Admitting: Family Medicine

## 2024-05-29 DIAGNOSIS — K219 Gastro-esophageal reflux disease without esophagitis: Secondary | ICD-10-CM

## 2024-05-30 ENCOUNTER — Other Ambulatory Visit: Payer: Self-pay

## 2024-05-30 ENCOUNTER — Other Ambulatory Visit: Payer: Self-pay | Admitting: Family Medicine

## 2024-05-30 ENCOUNTER — Telehealth: Payer: Self-pay | Admitting: Family Medicine

## 2024-05-30 DIAGNOSIS — K219 Gastro-esophageal reflux disease without esophagitis: Secondary | ICD-10-CM

## 2024-05-30 MED ORDER — FAMOTIDINE 20 MG PO TABS
20.0000 mg | ORAL_TABLET | Freq: Two times a day (BID) | ORAL | 0 refills | Status: DC
  Filled 2024-05-30: qty 60, 30d supply, fill #0
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
## 2024-06-01 ENCOUNTER — Other Ambulatory Visit: Payer: Self-pay

## 2024-06-02 NOTE — Telephone Encounter (Signed)
 Refilled on 05/31/24

## 2024-06-30 ENCOUNTER — Other Ambulatory Visit: Payer: Self-pay | Admitting: Family Medicine

## 2024-06-30 DIAGNOSIS — K219 Gastro-esophageal reflux disease without esophagitis: Secondary | ICD-10-CM

## 2024-07-02 MED ORDER — FAMOTIDINE 20 MG PO TABS
20.0000 mg | ORAL_TABLET | Freq: Two times a day (BID) | ORAL | 0 refills | Status: DC
  Filled 2024-07-02: qty 60, 30d supply, fill #0

## 2024-07-03 ENCOUNTER — Other Ambulatory Visit: Payer: Self-pay

## 2024-07-04 ENCOUNTER — Other Ambulatory Visit: Payer: Self-pay | Admitting: Neurology

## 2024-07-04 ENCOUNTER — Telehealth: Payer: Self-pay | Admitting: Neurology

## 2024-07-04 MED ORDER — CLOBAZAM 20 MG PO TABS: 20.0000 mg | ORAL_TABLET | Freq: Every day | ORAL | 5 refills | Status: DC

## 2024-07-04 NOTE — Telephone Encounter (Signed)
 Called mother [phone]) and she stated that he had the grand mal  sz due to not getting proper sleep. She stated that the pt was out of his med and needed a refill. I did notice it showed dispensed 06/28/24 so we 3 way called the pharmacy and found out he picked it up on 07/03/23. Pt wanted to double up on the medication due to having insomnia and thinks that it will help. Pt is willing to come in for a face to face visit with Dr. Gregg. Mother would like provider to encourage him to move home where he has a support system to check on him to make sure he takes meds as prescribed. Pt mother stated that he ha applied for disability 3 times and has been turned down. Would like to know how to get him some help

## 2024-07-04 NOTE — Telephone Encounter (Signed)
 CALLED AND RELAYED INFORMATION TO MOTHER. SHE VOICED UNDERSTANDING AND GRATITUDE
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
## 2024-07-04 NOTE — Telephone Encounter (Signed)
 Please call and inform patient that I have increase the Clobazam  to 20 mg nighty. We will keep his appointment for 2/4

## 2024-07-25 ENCOUNTER — Ambulatory Visit: Attending: Family Medicine | Admitting: Family Medicine

## 2024-07-25 ENCOUNTER — Encounter: Payer: Self-pay | Admitting: Family Medicine

## 2024-07-25 VITALS — BP 136/78 | HR 73 | Temp 98.9°F | Ht 71.0 in | Wt 227.0 lb

## 2024-07-25 DIAGNOSIS — F4010 Social phobia, unspecified: Secondary | ICD-10-CM

## 2024-07-25 DIAGNOSIS — K219 Gastro-esophageal reflux disease without esophagitis: Secondary | ICD-10-CM

## 2024-07-25 DIAGNOSIS — G629 Polyneuropathy, unspecified: Secondary | ICD-10-CM | POA: Diagnosis not present

## 2024-07-25 DIAGNOSIS — I10 Essential (primary) hypertension: Secondary | ICD-10-CM | POA: Diagnosis not present

## 2024-07-25 DIAGNOSIS — C719 Malignant neoplasm of brain, unspecified: Secondary | ICD-10-CM

## 2024-07-25 MED ORDER — GABAPENTIN 300 MG PO CAPS: 300.0000 mg | ORAL_CAPSULE | Freq: Every day | ORAL | 3 refills | Status: AC

## 2024-07-25 MED ORDER — LISINOPRIL 2.5 MG PO TABS: 2.5000 mg | ORAL_TABLET | Freq: Every day | ORAL | 1 refills | Status: AC

## 2024-07-25 MED ORDER — FAMOTIDINE 20 MG PO TABS: 20.0000 mg | ORAL_TABLET | Freq: Two times a day (BID) | ORAL | 1 refills | Status: AC

## 2024-07-25 MED ORDER — PANTOPRAZOLE SODIUM 40 MG PO TBEC: 40.0000 mg | DELAYED_RELEASE_TABLET | Freq: Every day | ORAL | 1 refills | Status: AC
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
## 2024-07-25 NOTE — Patient Instructions (Signed)
 VISIT SUMMARY:  During your visit today, we discussed your chronic back pain, recent seizure activity, and other ongoing health issues. We reviewed your spinal tumors, seizure disorder, gastroesophageal reflux disease, hypertension, and mental health. We also addressed your general health maintenance needs.  YOUR PLAN:  -SPINAL EPENDYMOMA WITH NEUROPATHY: You have a spinal tumor at L2 causing chronic numbness in your lower leg. The tumor has not changed in size recently. We have prescribed gabapentin  to help manage the numbness. Please take it at night as directed.  -SEIZURE DISORDER: You experienced a grand mal seizure on December 31st, 2023. Your neurologist has increased your medication dosage. Please continue to follow up with your neurologist on February 4th, 2025.  -GASTROESOPHAGEAL REFLUX DISEASE: You have gastroesophageal reflux disease, which causes stomach acid to flow back into your esophagus. Continue taking famotidine  and pantoprazole  as prescribed to manage your symptoms.  -PRIMARY HYPERTENSION: Your blood pressure is well-controlled with lisinopril . Continue taking lisinopril  as prescribed to maintain your blood pressure.  -SOCIAL ANXIETY DISORDER AND DEPRESSION: You are managing your social anxiety disorder and depression with the help of your psychiatrist. Continue with your current psychiatric management plan.  -GENERAL HEALTH MAINTENANCE: You are due for a cholesterol check and colon cancer screening. We have ordered a cholesterol lab test for you. We discussed colon cancer screening options, including a stool test or colonoscopy. You declined the pneumonia vaccine.  INSTRUCTIONS:  Please follow up with your neurologist on February 4th, 2025. Continue taking your medications as prescribed. Complete the cholesterol lab test and consider the colon cancer screening options we discussed.

## 2024-08-02 ENCOUNTER — Ambulatory Visit: Admitting: Neurology

## 2024-08-02 ENCOUNTER — Encounter: Payer: Self-pay | Admitting: Neurology

## 2024-08-02 VITALS — BP 133/86 | HR 65 | Ht 71.0 in | Wt 230.0 lb

## 2024-08-02 DIAGNOSIS — G4700 Insomnia, unspecified: Secondary | ICD-10-CM

## 2024-08-02 DIAGNOSIS — Z5181 Encounter for therapeutic drug level monitoring: Secondary | ICD-10-CM

## 2024-08-02 DIAGNOSIS — G40909 Epilepsy, unspecified, not intractable, without status epilepticus: Secondary | ICD-10-CM | POA: Diagnosis not present

## 2024-08-02 MED ORDER — CLOBAZAM 10 MG PO TABS: 10.0000 mg | ORAL_TABLET | Freq: Two times a day (BID) | ORAL | 5 refills | Status: AC
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
## 2024-08-02 NOTE — Patient Instructions (Signed)
 Continue with Clobazam  10 mg twice daily  Will check labs today  Discussed driving restriction for a total of 6 months  Return in 6 months or sooner if worse

## 2024-08-03 LAB — COMPREHENSIVE METABOLIC PANEL WITH GFR
ALT: 24 [IU]/L (ref 0–44)
AST: 22 [IU]/L (ref 0–40)
Albumin: 4.5 g/dL (ref 3.8–4.9)
Alkaline Phosphatase: 96 [IU]/L (ref 47–123)
BUN/Creatinine Ratio: 8 — ABNORMAL LOW (ref 9–20)
BUN: 8 mg/dL (ref 6–24)
Bilirubin Total: 0.3 mg/dL (ref 0.0–1.2)
CO2: 21 mmol/L (ref 20–29)
Calcium: 9.9 mg/dL (ref 8.7–10.2)
Chloride: 102 mmol/L (ref 96–106)
Creatinine, Ser: 0.99 mg/dL (ref 0.76–1.27)
Globulin, Total: 2.9 g/dL (ref 1.5–4.5)
Glucose: 88 mg/dL (ref 70–99)
Potassium: 4.2 mmol/L (ref 3.5–5.2)
Sodium: 142 mmol/L (ref 134–144)
Total Protein: 7.4 g/dL (ref 6.0–8.5)
eGFR: 92 mL/min/{1.73_m2}

## 2024-08-03 LAB — CLOBAZAM

## 2024-08-03 LAB — VITAMIN D 25 HYDROXY (VIT D DEFICIENCY, FRACTURES): Vit D, 25-Hydroxy: 89.8 ng/mL (ref 30.0–100.0)

## 2024-11-15 ENCOUNTER — Ambulatory Visit: Payer: Self-pay | Admitting: Family Medicine
